# Patient Record
Sex: Male | Born: 1955
Health system: Southern US, Community
[De-identification: ages and names within clinical notes are randomized; demographics above are authoritative.]

## PROBLEM LIST (undated history)

## (undated) DIAGNOSIS — N400 Enlarged prostate without lower urinary tract symptoms: Secondary | ICD-10-CM

## (undated) DIAGNOSIS — K8020 Calculus of gallbladder without cholecystitis without obstruction: Secondary | ICD-10-CM

## (undated) DIAGNOSIS — K635 Polyp of colon: Secondary | ICD-10-CM

## (undated) DIAGNOSIS — R748 Abnormal levels of other serum enzymes: Secondary | ICD-10-CM

## (undated) DIAGNOSIS — Z862 Personal history of diseases of the blood and blood-forming organs and certain disorders involving the immune mechanism: Secondary | ICD-10-CM

## (undated) DIAGNOSIS — K759 Inflammatory liver disease, unspecified: Secondary | ICD-10-CM

## (undated) DIAGNOSIS — I071 Rheumatic tricuspid insufficiency: Secondary | ICD-10-CM

## (undated) DIAGNOSIS — R42 Dizziness and giddiness: Secondary | ICD-10-CM

## (undated) DIAGNOSIS — E7800 Pure hypercholesterolemia, unspecified: Secondary | ICD-10-CM

## (undated) DIAGNOSIS — N529 Male erectile dysfunction, unspecified: Secondary | ICD-10-CM

## (undated) DIAGNOSIS — E079 Disorder of thyroid, unspecified: Secondary | ICD-10-CM

## (undated) DIAGNOSIS — E039 Hypothyroidism, unspecified: Secondary | ICD-10-CM

## (undated) DIAGNOSIS — M1990 Unspecified osteoarthritis, unspecified site: Secondary | ICD-10-CM

## (undated) DIAGNOSIS — I340 Nonrheumatic mitral (valve) insufficiency: Secondary | ICD-10-CM

## (undated) HISTORY — DX: Hypothyroidism, unspecified: E03.9

## (undated) HISTORY — DX: Inflammatory liver disease, unspecified: K75.9

## (undated) HISTORY — PX: FLEXIBLE SIGMOIDOSCOPY: SHX1649

## (undated) HISTORY — DX: Nonrheumatic mitral (valve) insufficiency: I34.0

## (undated) HISTORY — DX: Polyp of colon: K63.5

## (undated) HISTORY — DX: Calculus of gallbladder without cholecystitis without obstruction: K80.20

## (undated) HISTORY — DX: Dizziness and giddiness: R42

## (undated) HISTORY — DX: Benign prostatic hyperplasia without lower urinary tract symptoms: N40.0

## (undated) HISTORY — DX: Abnormal levels of other serum enzymes: R74.8

## (undated) HISTORY — DX: Male erectile dysfunction, unspecified: N52.9

## (undated) HISTORY — DX: Rheumatic tricuspid insufficiency: I07.1

## (undated) HISTORY — DX: Personal history of diseases of the blood and blood-forming organs and certain disorders involving the immune mechanism: Z86.2

## (undated) HISTORY — DX: Pure hypercholesterolemia, unspecified: E78.00

## (undated) HISTORY — DX: Disorder of thyroid, unspecified: E07.9

---

## 2005-07-08 ENCOUNTER — Ambulatory Visit (HOSPITAL_COMMUNITY): Admission: RE | Admit: 2005-07-08 | Discharge: 2005-07-08 | Payer: Self-pay | Admitting: Gastroenterology

## 2005-07-08 ENCOUNTER — Encounter (INDEPENDENT_AMBULATORY_CARE_PROVIDER_SITE_OTHER): Payer: Self-pay | Admitting: *Deleted

## 2007-12-28 ENCOUNTER — Emergency Department (HOSPITAL_COMMUNITY): Admission: EM | Admit: 2007-12-28 | Discharge: 2007-12-28 | Payer: Self-pay | Admitting: Emergency Medicine

## 2011-05-07 NOTE — Consult Note (Signed)
NAME:  Alex West, Alex West NO.:  [id number]   MEDICAL RECORD NO.:  [id number]          PATIENT TYPE:  EMS   LOCATION:  MAJO                         FACILITY:  [facility]   PHYSICIAN:  Jake Bathe, MD      DATE OF BIRTH:  06-Feb-1956   DATE OF CONSULTATION:  12/28/2007  DATE OF DISCHARGE:                                 CONSULTATION   REFERRING PHYSICIAN:  Orlene Och, MD   REASON FOR CONSULTATION:  Mr. Caseres has been seen at the request of Dr.  Patrica Duel for the evaluation of abnormal ECG and chest pain.   HISTORY OF PRESENT ILLNESS:  This is a 55 year old male who was seen  today in urgent care by Dr. Lazarus Salines and complained of mid scapular left-  sided burning pain that was occurring over the past week with waxing and  waning.  Worse with his neck flexed.  He has had similar discomfort in  the past.  No syncope.  No fevers, chills, nausea, vomiting, cough,  bleeding, orthopnea, or PND.   EKG was performed at urgent care and demonstrated ST elevation in I and  aVL, which was significant for J-point elevation with concomitant ST  depression in III and aVF.  He was then given an aspirin and taken over  to [facility] for possible emergent STEMI catheterization.  Once in the  catheterization lab, he was reevaluated by Dr. Vesta Mixer, who  promptly noted that he was in no distress, comfortable.  The patient  refused cardiac catheterization.   He was taken down to the emergency department where I saw him, and he is  currently asymptomatic.  No chest pain and describes the story as above,  mostly describing muscle tension in his mid scapular area.   He had seen Dr. Katrinka Blazing approximately 4-5 years ago where he had a stress  test performed possibly because of his abnormal EKG or old findings on  abnormal EKG and was noted to have a pericardial effusion, which was  monitored.  He does remember him saying something about an abnormal  electrocardiogram.  I do not have the copy  in front of me.   PAST MEDICAL HISTORY:  1. Hyperlipidemia - however, off Lipitor for the past 2 years.  2. Hypothyroidism - on Synthroid.  3. History of pericardial effusion and possible abnormal EKG as      current.  4. Erectile dysfunction.  5. Tobacco abuse.   ALLERGIES:  No known drug allergies.   MEDICATIONS:  1. Synthroid.  2. Cialis p.r.n.   FAMILY HISTORY:  He has a family history of hypertension, but no early  coronary artery disease.   SOCIAL HISTORY:  He is a Community education officer.  He currently smokes.  No drug  use.  Rare alcohol use.   REVIEW OF SYSTEMS:  Unless specified above, all of the 12 review of  systems negative.   PHYSICAL EXAMINATION:  VITAL SIGNS:  Blood pressure 133/88 and pulse 66.  Satting 100% on room air.  Afebrile.  GENERAL:  Alert and oriented x3, in no acute distress,  pleasant, lying  in bed.  EYES:  Well perfused conjunctivae.  EOMI.  No scleral icterus.  ENT:  Moist mucous membranes.  NECK:  Supple.  No carotid bruits.  No JVD.  No thyromegaly.  CARDIOVASCULAR:  Regular rate and rhythm with no murmurs, rubs, or  gallops.  Normal PMI.  LUNGS:  Clear to auscultation bilaterally.  Normal respiratory effort.  ABDOMEN:  Mildly obese.  Positive bowel sounds.  No bruits.  EXTREMITIES:  No clubbing, cyanosis, or edema.  Normal distal pulses.  NEUROLOGIC:  Nonfocal and normal gait.  SKIN:  Warm, dry, and intact.  No rashes.   DATA:  ECG performed here in the emergency department is consistent with  Dr. Raye Sorrow ECG from early with J-point elevation in I and aVL as well as  ST depression and T-wave inversion in III and aVF.  No changes in EKG.  First set of cardiac biomarkers was normal with an MB less than 5.  Awaiting further labs.   ASSESSMENT:  A 55 year old male with tobacco abuse, former  hyperlipidemia with 1-week of waxing and waning left shoulder, left arm,  and left back discomfort and mild radiation to his chest wall.   PLAN:  The pain is  fairly atypical for acute coronary syndrome.  He is  able to reproduce this discomfort when he flexes his neck forward or  touches his chin to his chest.  His EKGs have not evolved.  Unfortunately, I do not have a former ECG to compare this to.  First set  of cardiac biomarkers is unremarkable and normal.  He is currently  asymptomatic and doing well.  After reviewing the situation, I feel  comfortable sending him home with close followup where I will see him in  the office, hopefully, in 1 week.  At that time, I may repeat stress  testing as well as echocardiogram given his prior history of pericardial  effusion.  Certainly, his ECG can be consistent with pericarditis or  just may be his normal variant.  Prior EKG will be helpful.  The patient  is satisfied with this plan and knows to return to the emergency  department immediately if any further symptoms develop or anything  worrisome occurs.      Jake Bathe, MD  Electronically Signed     MCS/MEDQ  D:  12/28/2007  T:  12/29/2007  Job:  [id number]   cc:   Orlene Och, MD

## 2011-05-10 NOTE — Op Note (Signed)
NAMEMarland West  MALACAI, GRANTZ                ACCOUNT NO.:  [id number]   MEDICAL RECORD NO.:  [id number]          PATIENT TYPE:  AMB   LOCATION:  ENDO                         FACILITY:  [facility]   PHYSICIAN:  Bernette Redbird, M.D.   DATE OF BIRTH:  12-20-1956   DATE OF PROCEDURE:  07/08/2005  DATE OF DISCHARGE:                                 OPERATIVE REPORT   PROCEDURE:  Colonoscopy with polypectomy.   INDICATIONS:  A 55 year old African-American male for initial colon cancer  screening exam with a history of recurrent small-volume hematochezia which  has improved somewhat on cortisone cream treatments, associated with mild  anemia (hemoglobin 12.6) but with normal iron studies.   FINDINGS:  Excoriated internal hemorrhoids. Small colon polyp.   PROCEDURE:  The nature, purpose, risks of the procedure had been discussed  with the patient who provided written consent. Sedation prior to and during  the course the exam totaled fentanyl 75 mcg and Versed 7 milligrams IV  without arrhythmias or desaturation. Digital exam showed really a rather  flat prostate bed. No prostatic enlargement or nodules were appreciated.   The Olympus adult adjustable tension video colonoscope was advanced to the  area just above the cecum, whereupon some external abdominal compression  facilitated entry into the cecum as identified by clear visualization of the  appendiceal orifice. The terminal ileum was entered for short distance also  and appeared normal. Pullback was then performed. The quality of prep was  very good and it is felt that all areas were well seen. A moderate amount  irrigation was used to get rid of a little bit of film and stool film  especially in the proximal colon.   Just above the cecum, almost on the roof of the ileocecal valve, was a 4-5  mm semipedunculated polyp removed by cold snare technique and retrieved by  suctioning through the scope.   No other polyps were seen and there was no  evidence of cancer, colitis,  vascular malformations or diverticulosis. Retroflexion of the rectum and  reinspection of the rectum was unremarkable and in particular there was no  evidence of proctitis.   Pullout through the anal canal, however, demonstrated moderate internal  hemorrhoids with fairly significant excoriation.   No anal fissure was seen.   The patient tolerated the procedure well and there no apparent  complications.   IMPRESSION:  1.  Solitary small polyp removed as described above.  2.  Rectal bleeding presumably of hemorrhoidal origin based on current      appearance.   PLAN:  1.  Await pathology on the polyp.  2.  Could consider injection therapy for hemorrhoidal treatment if bleeding      persists.       RB/MEDQ  D:  07/08/2005  T:  07/08/2005  Job:  [id number]   cc:   C. Duane Lope, M.D.  [address]  [place]  [place] [address]  Fax: [phone]

## 2011-09-12 LAB — APTT: aPTT: 28

## 2011-09-12 LAB — POCT CARDIAC MARKERS
CKMB, poc: 1.6
Myoglobin, poc: 77.3

## 2011-09-12 LAB — PROTIME-INR
INR: 1.1
Prothrombin Time: 14.1

## 2012-11-04 ENCOUNTER — Other Ambulatory Visit: Payer: Self-pay | Admitting: Gastroenterology

## 2012-11-04 DIAGNOSIS — R7989 Other specified abnormal findings of blood chemistry: Secondary | ICD-10-CM

## 2012-11-10 ENCOUNTER — Ambulatory Visit
Admission: RE | Admit: 2012-11-10 | Discharge: 2012-11-10 | Disposition: A | Discharge status: Home / Self Care | Payer: BC Managed Care – PPO | Source: Ambulatory Visit | Attending: Gastroenterology | Admitting: Gastroenterology

## 2012-11-10 DIAGNOSIS — R7989 Other specified abnormal findings of blood chemistry: Secondary | ICD-10-CM

## 2013-01-25 ENCOUNTER — Ambulatory Visit (INDEPENDENT_AMBULATORY_CARE_PROVIDER_SITE_OTHER): Payer: BC Managed Care – PPO | Admitting: Internal Medicine

## 2013-01-25 VITALS — BP 120/90 | HR 79 | Temp 98.4°F | Resp 16 | Ht 74.0 in | Wt 265.0 lb

## 2013-01-25 DIAGNOSIS — H6090 Unspecified otitis externa, unspecified ear: Secondary | ICD-10-CM

## 2013-01-25 DIAGNOSIS — H60399 Other infective otitis externa, unspecified ear: Secondary | ICD-10-CM

## 2013-01-25 DIAGNOSIS — H6690 Otitis media, unspecified, unspecified ear: Secondary | ICD-10-CM

## 2013-01-25 MED ORDER — NEOMYCIN-POLYMYXIN-HC 3.5-10000-1 OT SOLN: 3.0000 [drp] | Freq: Four times a day (QID) | OTIC | Status: DC

## 2013-01-25 MED ORDER — AMOXICILLIN-POT CLAVULANATE 875-125 MG PO TABS: 1.0000 | ORAL_TABLET | Freq: Two times a day (BID) | ORAL | Status: DC

## 2013-01-25 NOTE — Patient Instructions (Signed)
Take meds as directed

## 2013-01-25 NOTE — Progress Notes (Signed)
  Subjective:    Patient ID: Alex West, male    DOB: 1956/05/19, 57 y.o.   MRN: [id number]  HPI Right ear pain Stuffiness in ear Decrease hearing Feels like water in his ear  Onset 1 week ago Used debrox at home with no relief. Had a previous episode that was similar  Review of Systems  Constitutional: Negative.   HENT: Positive for hearing loss, ear pain and congestion.   Eyes: Negative.   Respiratory: Negative.   Cardiovascular: Negative.   Gastrointestinal: Negative.   Genitourinary: Negative.   Musculoskeletal: Negative.   Skin: Negative.   Neurological: Negative.   Hematological: Negative.   Psychiatric/Behavioral: Negative.   All other systems reviewed and are negative.       Objective:   Physical Exam  Nursing note and vitals reviewed. Constitutional: He is oriented to person, place, and time. He appears well-developed and well-nourished.  HENT:  Head: Normocephalic and atraumatic.  Left Ear: External ear normal.       Right ear with otitis media and otitis externa  Eyes: Conjunctivae normal and EOM are normal. Pupils are equal, round, and reactive to light.  Neck: Normal range of motion. Neck supple.  Cardiovascular: Normal rate, regular rhythm and normal heart sounds.   Pulmonary/Chest: Effort normal.  Abdominal: Soft. Bowel sounds are normal.  Musculoskeletal: Normal range of motion.  Neurological: He is alert and oriented to person, place, and time.  Skin: Skin is warm and dry.  Psychiatric: He has a normal mood and affect. His behavior is normal. Judgment and thought content normal.          Assessment & Plan:  Otitis media and otitis externa of the right ear

## 2013-03-04 ENCOUNTER — Other Ambulatory Visit (HOSPITAL_COMMUNITY): Payer: Self-pay | Admitting: Internal Medicine

## 2013-03-04 DIAGNOSIS — K759 Inflammatory liver disease, unspecified: Secondary | ICD-10-CM

## 2013-03-05 ENCOUNTER — Other Ambulatory Visit: Payer: Self-pay | Admitting: Radiology

## 2013-03-08 ENCOUNTER — Encounter (HOSPITAL_COMMUNITY): Payer: Self-pay

## 2013-03-08 ENCOUNTER — Ambulatory Visit (HOSPITAL_COMMUNITY)
Admission: RE | Admit: 2013-03-08 | Discharge: 2013-03-08 | Disposition: A | Discharge status: Home / Self Care | Payer: BC Managed Care – PPO | Source: Ambulatory Visit | Attending: Internal Medicine | Admitting: Internal Medicine

## 2013-03-08 DIAGNOSIS — K759 Inflammatory liver disease, unspecified: Secondary | ICD-10-CM

## 2013-03-08 DIAGNOSIS — E079 Disorder of thyroid, unspecified: Secondary | ICD-10-CM | POA: Insufficient documentation

## 2013-03-08 DIAGNOSIS — K8020 Calculus of gallbladder without cholecystitis without obstruction: Secondary | ICD-10-CM | POA: Insufficient documentation

## 2013-03-08 DIAGNOSIS — K739 Chronic hepatitis, unspecified: Secondary | ICD-10-CM | POA: Insufficient documentation

## 2013-03-08 LAB — CBC
MCH: 32.1 pg (ref 26.0–34.0)
MCV: 88.9 fL (ref 78.0–100.0)
Platelets: 176 10*3/uL (ref 150–400)
RBC: 4.61 MIL/uL (ref 4.22–5.81)
RDW: 12.3 % (ref 11.5–15.5)

## 2013-03-08 MED ORDER — MIDAZOLAM HCL 2 MG/2ML IJ SOLN
INTRAMUSCULAR | Status: AC | PRN
  Administered 2013-03-08: 1 mg via INTRAVENOUS
  Administered 2013-03-08 (×2): 0.5 mg via INTRAVENOUS

## 2013-03-08 MED ORDER — SODIUM CHLORIDE 0.9 % IV SOLN
Freq: Once | INTRAVENOUS | Status: AC
  Administered 2013-03-08: 09:00:00 via INTRAVENOUS

## 2013-03-08 MED ORDER — MIDAZOLAM HCL 2 MG/2ML IJ SOLN
INTRAMUSCULAR | Status: AC
  Filled 2013-03-08: qty 4

## 2013-03-08 MED ORDER — OXYCODONE HCL 5 MG PO TABS: 5.0000 mg | ORAL_TABLET | ORAL | Status: DC | PRN

## 2013-03-08 MED ORDER — FENTANYL CITRATE 0.05 MG/ML IJ SOLN
INTRAMUSCULAR | Status: AC | PRN
  Administered 2013-03-08: 25 ug via INTRAVENOUS
  Administered 2013-03-08: 50 ug via INTRAVENOUS
  Administered 2013-03-08: 25 ug via INTRAVENOUS

## 2013-03-08 MED ORDER — FENTANYL CITRATE 0.05 MG/ML IJ SOLN
INTRAMUSCULAR | Status: AC
  Filled 2013-03-08: qty 4

## 2013-03-08 NOTE — H&P (Signed)
Alex West is an 57 y.o. male.   Chief Complaint: elevated liver functions x 2 yrs Worsened recently Sent for [place] and discovered gallstones Referred to Dr Timothy Lasso; request made for liver core biopsy HPI: Thyroid dz  Past Medical History  Diagnosis Date  . Thyroid disease     History reviewed. No pertinent past surgical history.  Family History  Problem Relation Age of Onset  . Kidney failure Brother   . Lupus Daughter    Social History:  reports that he has never smoked. He does not have any smokeless tobacco history on file. He reports that he does not drink alcohol or use illicit drugs.  Allergies: No Known Allergies   (Not in a hospital admission)  Results for orders placed during the hospital encounter of 03/08/13 (from the past 48 hour(s))  APTT     Status: None   Collection Time    03/08/13  9:01 AM      Result Value Range   aPTT 30  24 - 37 seconds  CBC     Status: Abnormal   Collection Time    03/08/13  9:01 AM      Result Value Range   WBC 5.3  4.0 - 10.5 K/uL   RBC 4.61  4.22 - 5.81 MIL/uL   Hemoglobin 14.8  13.0 - 17.0 g/dL   HCT 16.1  09.6 - 04.5 %   MCV 88.9  78.0 - 100.0 fL   MCH 32.1  26.0 - 34.0 pg   MCHC 36.1 (*) 30.0 - 36.0 g/dL   RDW 40.9  81.1 - 91.4 %   Platelets 176  150 - 400 K/uL  PROTIME-INR     Status: None   Collection Time    03/08/13  9:01 AM      Result Value Range   Prothrombin Time 14.5  11.6 - 15.2 seconds   INR 1.15  0.00 - 1.49   No results found.  Review of Systems  Constitutional: Negative for fever and weight loss.  Respiratory: Negative for cough.   Cardiovascular: Negative for chest pain.  Gastrointestinal: Negative for nausea, vomiting and abdominal pain.  Neurological: Negative for weakness and headaches.  Psychiatric/Behavioral: Negative for substance abuse.    Blood pressure 123/85, pulse 6, temperature 97 F (36.1 C), temperature source Oral, resp. rate 18, height 6\' 3"  (1.905 m), weight 260 lb (117.935 kg),  SpO2 98.00%. Physical Exam  Constitutional: He is oriented to person, place, and time. He appears well-developed and well-nourished.  Cardiovascular: Normal rate, regular rhythm and normal heart sounds.   No murmur heard. Respiratory: Effort normal and breath sounds normal.  GI: Soft. Bowel sounds are normal. There is no tenderness.  Musculoskeletal: Normal range of motion.  Neurological: He is alert and oriented to person, place, and time.  Psychiatric: He has a normal mood and affect. His behavior is normal. Judgment and thought content normal.     Assessment/Plan Elevated liver fxs Scheduled for liver core biopsy Pt aware of procedure benefits and risks and agreeable to proceed Consent signed and in chart  Alex West A 03/08/2013, 9:36 AM

## 2013-03-08 NOTE — ED Notes (Signed)
Short stay notified of need for bed 

## 2013-03-08 NOTE — Procedures (Signed)
Successful ultrasound guided liver biopsy.  3 cores obtained and no immediate complication.

## 2014-07-19 ENCOUNTER — Other Ambulatory Visit: Payer: Self-pay | Admitting: Nurse Practitioner

## 2014-07-19 DIAGNOSIS — C220 Liver cell carcinoma: Secondary | ICD-10-CM

## 2014-07-29 ENCOUNTER — Ambulatory Visit
Admission: RE | Admit: 2014-07-29 | Discharge: 2014-07-29 | Disposition: A | Discharge status: Home / Self Care | Payer: BC Managed Care – PPO | Source: Ambulatory Visit | Attending: Nurse Practitioner | Admitting: Nurse Practitioner

## 2014-07-29 DIAGNOSIS — C220 Liver cell carcinoma: Secondary | ICD-10-CM

## 2014-08-26 ENCOUNTER — Encounter (HOSPITAL_COMMUNITY): Payer: Self-pay

## 2014-09-05 ENCOUNTER — Ambulatory Visit (INDEPENDENT_AMBULATORY_CARE_PROVIDER_SITE_OTHER): Payer: BC Managed Care – PPO | Admitting: Internal Medicine

## 2014-09-05 VITALS — BP 124/90 | HR 105 | Temp 102.5°F | Resp 16 | Ht 73.75 in | Wt 265.4 lb

## 2014-09-05 DIAGNOSIS — R5381 Other malaise: Secondary | ICD-10-CM

## 2014-09-05 DIAGNOSIS — R5383 Other fatigue: Secondary | ICD-10-CM

## 2014-09-05 DIAGNOSIS — D899 Disorder involving the immune mechanism, unspecified: Secondary | ICD-10-CM

## 2014-09-05 DIAGNOSIS — R509 Fever, unspecified: Secondary | ICD-10-CM

## 2014-09-05 DIAGNOSIS — K759 Inflammatory liver disease, unspecified: Secondary | ICD-10-CM

## 2014-09-05 DIAGNOSIS — D849 Immunodeficiency, unspecified: Secondary | ICD-10-CM | POA: Insufficient documentation

## 2014-09-05 DIAGNOSIS — K754 Autoimmune hepatitis: Secondary | ICD-10-CM | POA: Insufficient documentation

## 2014-09-05 LAB — POCT UA - MICROSCOPIC ONLY
Bacteria, U Microscopic: NEGATIVE
Casts, Ur, LPF, POC: NEGATIVE
Crystals, Ur, HPF, POC: NEGATIVE
EPITHELIAL CELLS, URINE PER MICROSCOPY: NEGATIVE
MUCUS UA: NEGATIVE
RBC, URINE, MICROSCOPIC: NEGATIVE
Yeast, UA: NEGATIVE

## 2014-09-05 LAB — POCT CBC
GRANULOCYTE PERCENT: 84.3 % — AB (ref 37–80)
HEMATOCRIT: 46.6 % (ref 43.5–53.7)
Hemoglobin: 15.3 g/dL (ref 14.1–18.1)
Lymph, poc: 1.3 (ref 0.6–3.4)
MCH: 30.9 pg (ref 27–31.2)
MCHC: 32.9 g/dL (ref 31.8–35.4)
MCV: 94 fL (ref 80–97)
MID (cbc): 0.7 (ref 0–0.9)
MPV: 8.3 fL (ref 0–99.8)
POC Granulocyte: 11.2 — AB (ref 2–6.9)
POC LYMPH %: 10.1 % (ref 10–50)
POC MID %: 5.6 %M (ref 0–12)
Platelet Count, POC: 167 10*3/uL (ref 142–424)
RBC: 4.96 M/uL (ref 4.69–6.13)
RDW, POC: 13.4 %
WBC: 13.3 10*3/uL — AB (ref 4.6–10.2)

## 2014-09-05 LAB — POCT URINALYSIS DIPSTICK
Bilirubin, UA: NEGATIVE
GLUCOSE UA: NEGATIVE
KETONES UA: NEGATIVE
Nitrite, UA: NEGATIVE
Protein, UA: NEGATIVE
RBC UA: NEGATIVE
SPEC GRAV UA: 1.01
Urobilinogen, UA: 0.2
pH, UA: 6

## 2014-09-05 MED ORDER — DOXYCYCLINE HYCLATE 100 MG PO TABS: 100.0000 mg | ORAL_TABLET | Freq: Two times a day (BID) | ORAL | Status: DC

## 2014-09-05 MED ORDER — CEFTRIAXONE SODIUM 1 G IJ SOLR
1.0000 g | Freq: Once | INTRAMUSCULAR | Status: AC
  Administered 2014-09-05: 1 g via INTRAMUSCULAR

## 2014-09-05 MED ORDER — ACETAMINOPHEN 325 MG PO TABS: 1000.0000 mg | ORAL_TABLET | ORAL | Status: DC | PRN

## 2014-09-05 MED ORDER — ACETAMINOPHEN 325 MG PO TABS: 500.0000 mg | ORAL_TABLET | ORAL | Status: DC | PRN

## 2014-09-05 NOTE — Patient Instructions (Addendum)
Immunosuppression Immunosuppression is the use of medicine to lower your body's immune response. Your immune response is your body's natural reaction to defend against something new or unknown that enters your body, such as viruses and bacteria.  WHY AM I RECEIVING IMMUNOSUPPRESSION? You may be receiving immunosuppression to hold back your body's immune response for one of the following reasons:  As treatment for an autoimmune disorder. These disorders cause your body to use the immune response to attack itself.  To prevent your body from rejecting a transplant of cells, tissues, or organs that you have received from a donor. When you receive a transplant from a donor, your immune system knows that something is new and unknown to your body. Sometimes this triggers an immune response to attack the transplant.  To prevent inflammation caused by certain long-term (chronic) conditions such as asthma or chronic obstructive pulmonary disease (COPD). Often these types of chronic conditions are associated with severe immune responses. These responses can cause inflammation that can lead to life-threatening situations. Suppression of your body's immune system also affects your body's ability to defend itself and can lead to certain side effects. WHAT ARE THE SIDE EFFECTS OF IMMUNOSUPPRESSION? The major side effect of immunosuppression is an increased risk of infection. You should call your health care provider if you have the following signs or symptoms of an infection:  A fever.  Drainage or bad odor of drainage from your surgical scar if you had an organ transplant.  Burning when you pass your urine.  A cold or cough that will not go away.  Body aches. Other side effects typically go away as your body adjusts to the medicine. These side effects can include:  Nausea and vomiting.  Loss of appetite.  Increased hair growth.  Shaky hands (hand tremor). Adjusting medicine dosages or treating the  side effects will often decrease problems.  WHY IS IT IMPORTANT FOR ME TO TAKE MY MEDICINE EXACTLY AS INSTRUCTED? In order for immunosuppression to work, your body needs to have just the right level of medicine all of the time. For this to happen, your health care provider will tell you exactly how much and exactly when you need to take your medicine. It is very important to follow your health care provider's instructions. Even missing a single dose can cause your condition to worsen. If you forget a dose of medicine, take it as soon as you remember and call your health care provider right away. However, if you miss a dose and it is time to take your next dose, do not take a double dose.  WHAT CAN HELP ME DEVELOP A ROUTINE TO TAKE MY MEDICINE EXACTLY AS INSTRUCTED?  Use a tool such as a pill Environmental education officer, a written chart from your health care provider, a notebook, a binder, or your own calendar to organize your daily medicine(s). Your tool should help you keep track of the:  Name of the medicine and the dose.  Days to take the medicine(s).  Time of day to take the medicine(s).  Set cues or reminders for taking your medicine(s), such as setting an alarm on a clock or cell phone.  Review your medicine schedule with family members or friends so they can help you remember when to take your medicine(s) and how much to take. Document Released: 12/14/2013 Document Reviewed: 12/14/2013 [facility] Patient Information 2015 [place], [place]. This information is not intended to replace advice given to you by your health care provider. Make sure you discuss any questions  you have with your health care provider. [place] Spotted Fever [place] Spotted Fever (RMSF) is the oldest known tick-borne disease of people in the [place]. This disease was named because it was first described among people in the [facility] area who had an illness characterized by a rash with red-purple-black spots. This  disease is caused by a rickettsia (Rickettsia rickettsii), a bacteria carried by the tick. The [facility] wood tick and the American dog tick acquire and transmit the RMSF bacteria (pictures NOT actual size). When a larval, nymphal, or adult tick feeds on an infected rodent or larger animal, the tick can become infected. Infected adult ticks then feed on people who may then get RMSF. The tick transmits the disease to humans during a prolonged period of feeding that lasts many hours, days, or even a couple weeks. The bite is painless and frequently goes unnoticed. An infected male tick may also pass the rickettsial bacteria to her eggs that then may mature to be infected adult ticks. The rickettsia that causes RMSF can also get into a person's body through damaged skin. A tick bite is not necessary. People can get RMSF if they crush a tick and get its blood or body fluids on their skin through a small cut or sore.  DIAGNOSIS Diagnosis is made by laboratory tests.  TREATMENT Treatment is with antibiotics (medications that kill rickettsia and other bacteria). Immediate treatment usually prevents death. GEOGRAPHIC RANGE This disease was reported only in the [facility] until 1931. RMSF has more recently been described among individuals in all states except [place], [place], and [place]. The highest reported incidences of RMSF now occur among residents of [place], [place], [place], and the [place]. TIME OF YEAR  Most cases are diagnosed during late spring and summer when ticks are most active. However, especially in the warmer [place] states, a few cases occur during the winter. SYMPTOMS   Symptoms of RMSF begin from 2 to 14 days after a tick bite. The most common early symptoms are fever, muscle aches, and headache followed by nausea (feeling sick to your stomach) or vomiting.  The RMSF rash is typically delayed until 3 or more days after symptom onset, and eventually develops in 9 of 10  infected patients by the fifth day of illness. If the disease is not treated it can cause death. If you get a fever, headache, muscle aches, rash, nausea, or vomiting within 2 weeks of a possible tick bite or exposure, you should see your caregiver immediately. PREVENTION Ticks prefer to hide in shady, moist ground litter. They can often be found above the ground clinging to tall grass, brush, shrubs and low tree branches. They also inhabit lawns and gardens, especially at the edges of woodlands and around old stone walls. Within the areas where ticks generally live, no naturally vegetated area can be considered completely free of infected ticks. The best precaution against RMSF is to avoid contact with soil, leaf litter, and vegetation as much as possible in tick-infested areas. For those who enjoy gardening or walking in their yards, clear brush and mow tall grass around houses and at the edges of gardens. This may help reduce the tick population in the immediate area. Applications of chemical insecticides by a licensed professional in the spring (late May) and fall (September) will also control ticks, especially in heavily infested areas. Treatment will never get rid of all the ticks. Getting rid of small animal populations that host ticks will also decrease the  tick population. When working in the garden, [employer], or handling soil and vegetation, wear light-colored protective clothing and gloves. Spot-check often to prevent ticks from reaching the skin. Ticks cannot jump or fly. They will not drop from an above-ground perch onto a passing animal. Once a tick gains access to human skin it climbs upward until it reaches a more protected area. For example, the back of the knee, groin, navel, armpit, ears, or nape of the neck. It then begins the slow process of embedding itself in the skin. Campers, hikers, field workers, and others who spend time in wooded, brushy, or tall grassy areas can avoid exposure  to ticks by using the following precautions:  Wear light-colored clothing with a tight weave to spot ticks more easily and prevent contact with the skin.  Wear long pants tucked into socks, long-sleeved shirts tucked into pants and enclosed shoes or boots along with insect repellent.  Spray clothes with insect repellent containing either DEET or Permethrin. Only DEET can be used on exposed skin. Follow the manufacturer's directions carefully.  Wear a hat and keep long hair pulled back.  Stay on cleared, well-worn trails whenever possible.  Spot-check yourself and others often for the presence of ticks on clothes. If you find one, there are likely to be others. Check thoroughly.  Remove clothes after leaving tick-infested areas. If possible, wash them to eliminate any unseen ticks. Check yourself, your children and any pets from head to toe for the presence of ticks.  Shower and shampoo. You can greatly reduce your chances of contracting RMSF if you remove attached ticks as soon as possible. Regular checks of the body, including all body sites covered by hair (head, armpits, genitals), allow removal of the tick before rickettsial transmission. To remove an attached tick, use a forceps or tweezers to detach the intact tick without leaving mouth parts in the skin. The tick bite wound should be cleansed after tick removal. Remember the most common symptoms of RMSF are fever, muscle aches, headache, and nausea or vomiting with a later onset of rash. If you get these symptoms after a tick bite and while living in an area where RMSF is found, RMSF should be suspected. If the disease is not treated, it can cause death. See your caregiver immediately if you get these symptoms. Do this even if not aware of a tick bite. Document Released: 03/23/2001 Document Revised: 04/25/2014 Document Reviewed: 11/13/2009 [facility] Patient Information 2015 [place], [place]. This information is not intended to replace advice  given to you by your health care provider. Make sure you discuss any questions you have with your health care provider. Tick Bite Information Ticks are insects that attach themselves to the skin and draw blood for food. There are various types of ticks. Common types include wood ticks and deer ticks. Most ticks live in shrubs and grassy areas. Ticks can climb onto your body when you make contact with leaves or grass where the tick is waiting. The most common places on the body for ticks to attach themselves are the scalp, neck, armpits, waist, and groin. Most tick bites are harmless, but sometimes ticks carry germs that cause diseases. These germs can be spread to a person during the tick's feeding process. The chance of a disease spreading through a tick bite depends on:   The type of tick.  Time of year.   How long the tick is attached.   Geographic location.  HOW CAN YOU PREVENT TICK BITES? Take  these steps to help prevent tick bites when you are outdoors:  Wear protective clothing. Long sleeves and long pants are best.   Wear white clothes so you can see ticks more easily.  Tuck your pant legs into your socks.   If walking on a trail, stay in the middle of the trail to avoid brushing against bushes.  Avoid walking through areas with long grass.  Put insect repellent on all exposed skin and along boot tops, pant legs, and sleeve cuffs.   Check clothing, hair, and skin repeatedly and before going inside.   Brush off any ticks that are not attached.  Take a shower or bath as soon as possible after being outdoors.  WHAT IS THE PROPER WAY TO REMOVE A TICK? Ticks should be removed as soon as possible to help prevent diseases caused by tick bites. 1. If latex gloves are available, put them on before trying to remove a tick.  2. Using fine-point tweezers, grasp the tick as close to the skin as possible. You may also use curved forceps or a tick removal tool. Grasp the tick  as close to its head as possible. Avoid grasping the tick on its body. 3. Pull gently with steady upward pressure until the tick lets go. Do not twist the tick or jerk it suddenly. This may break off the tick's head or mouth parts. 4. Do not squeeze or crush the tick's body. This could force disease-carrying fluids from the tick into your body.  5. After the tick is removed, wash the bite area and your hands with soap and water or other disinfectant such as alcohol. 6. Apply a small amount of antiseptic cream or ointment to the bite site.  7. Wash and disinfect any instruments that were used.  Do not try to remove a tick by applying a hot match, petroleum jelly, or fingernail polish to the tick. These methods do not work and may increase the chances of disease being spread from the tick bite.  WHEN SHOULD YOU SEEK MEDICAL CARE? Contact your health care provider if you are unable to remove a tick from your skin or if a part of the tick breaks off and is stuck in the skin.  After a tick bite, you need to be aware of signs and symptoms that could be related to diseases spread by ticks. Contact your health care provider if you develop any of the following in the days or weeks after the tick bite:  Unexplained fever.  Rash. A circular rash that appears days or weeks after the tick bite may indicate the possibility of Lyme disease. The rash may resemble a target with a bull's-eye and may occur at a different part of your body than the tick bite.  Redness and swelling in the area of the tick bite.   Tender, swollen lymph glands.   Diarrhea.   Weight loss.   Cough.   Fatigue.   Muscle, joint, or bone pain.   Abdominal pain.   Headache.   Lethargy or a change in your level of consciousness.  Difficulty walking or moving your legs.   Numbness in the legs.   Paralysis.  Shortness of breath.   Confusion.   Repeated vomiting.  Document Released: 12/06/2000 Document  Revised: 09/29/2013 Document Reviewed: 05/19/2013 [facility] Patient Information 2015 [place], [place]. This information is not intended to replace advice given to you by your health care provider. Make sure you discuss any questions you have with your health care  provider. Fever, Adult A fever is a higher than normal body temperature. In an adult, an oral temperature around 98.6 F (37 C) is considered normal. A temperature of 100.4 F (38 C) or higher is generally considered a fever. Mild or moderate fevers generally have no long-term effects and often do not require treatment. Extreme fever (greater than or equal to 106 F or 41.1 C) can cause seizures. The sweating that may occur with repeated or prolonged fever may cause dehydration. Elderly people can develop confusion during a fever. A measured temperature can vary with:  Age.  Time of day.  Method of measurement (mouth, underarm, rectal, or ear). The fever is confirmed by taking a temperature with a thermometer. Temperatures can be taken different ways. Some methods are accurate and some are not.  An oral temperature is used most commonly. Electronic thermometers are fast and accurate.  An ear temperature will only be accurate if the thermometer is positioned as recommended by the manufacturer.  A rectal temperature is accurate and done for those adults who have a condition where an oral temperature cannot be taken.  An underarm (axillary) temperature is not accurate and not recommended. Fever is a symptom, not a disease.  CAUSES   Infections commonly cause fever.  Some noninfectious causes for fever include:  Some arthritis conditions.  Some thyroid or adrenal gland conditions.  Some immune system conditions.  Some types of cancer.  A medicine reaction.  High doses of certain street drugs such as methamphetamine.  Dehydration.  Exposure to high outside or room temperatures.  Occasionally, the source of a fever  cannot be determined. This is sometimes called a "fever of unknown origin" (FUO).  Some situations may lead to a temporary rise in body temperature that may go away on its own. Examples are:  Childbirth.  Surgery.  Intense exercise. HOME CARE INSTRUCTIONS   Take appropriate medicines for fever. Follow dosing instructions carefully. If you use acetaminophen to reduce the fever, be careful to avoid taking other medicines that also contain acetaminophen. Do not take aspirin for a fever if you are younger than age 15. There is an association with Reye's syndrome. Reye's syndrome is a rare but potentially deadly disease.  If an infection is present and antibiotics have been prescribed, take them as directed. Finish them even if you start to feel better.  Rest as needed.  Maintain an adequate fluid intake. To prevent dehydration during an illness with prolonged or recurrent fever, you may need to drink extra fluid.Drink enough fluids to keep your urine clear or pale yellow.  Sponging or bathing with room temperature water may help reduce body temperature. Do not use ice water or alcohol sponge baths.  Dress comfortably, but do not over-bundle. SEEK MEDICAL CARE IF:   You are unable to keep fluids down.  You develop vomiting or diarrhea.  You are not feeling at least partly better after 3 days.  You develop new symptoms or problems. SEEK IMMEDIATE MEDICAL CARE IF:   You have shortness of breath or trouble breathing.  You develop excessive weakness.  You are dizzy or you faint.  You are extremely thirsty or you are making little or no urine.  You develop new pain that was not there before (such as in the head, neck, chest, back, or abdomen).  You have persistent vomiting and diarrhea for more than 1 to 2 days.  You develop a stiff neck or your eyes become sensitive to light.  You develop  a skin rash.  You have a fever or persistent symptoms for more than 2 to 3 days.  You  have a fever and your symptoms suddenly get worse. MAKE SURE YOU:   Understand these instructions.  Will watch your condition.  Will get help right away if you are not doing well or get worse. Document Released: 06/04/2001 Document Revised: 04/25/2014 Document Reviewed: 10/10/2011 [facility] Patient Information 2015 [place], [place]. This information is not intended to replace advice given to you by your health care provider. Make sure you discuss any questions you have with your health care provider.

## 2014-09-05 NOTE — Progress Notes (Signed)
   Subjective:    Patient ID: Alex West, male    DOB: 03-Aug-1956, 58 y.o.   MRN: [id number]  HPI    Review of Systems     Objective:   Physical Exam        Assessment & Plan:

## 2014-09-05 NOTE — Progress Notes (Signed)
Subjective:    Patient ID: Alex West, male    DOB: 11-30-1956, 58 y.o.   MRN: [id number]  HPI 58 y/o male Flu like symptoms for three days. Fever/Chills Body Ache Headache Sinus congestion nothing coming out  No Shortness of Breathing No wheezing No  Ear pain No tick bite No urinary issue No chest pain  Will get flu shot at his physical [facility] Dr. Harrington Challenger) Has autoimmune hepatitis on immuran 1 month previously on a steroid for 1 year with remission   Review of Systems     Objective:   Physical Exam  Vitals reviewed. Constitutional: He is oriented to person, place, and time. He appears well-developed and well-nourished. No distress.  HENT:  Head: Normocephalic.  Right Ear: External ear normal.  Left Ear: External ear normal.  Nose: Nose normal.  Mouth/Throat: Oropharynx is clear and moist.  Eyes: Conjunctivae and EOM are normal. Pupils are equal, round, and reactive to light. Right eye exhibits no discharge. Left eye exhibits no discharge. No scleral icterus.  Neck: Normal range of motion. Neck supple.  Cardiovascular: Regular rhythm, S1 normal, S2 normal and normal heart sounds.  Tachycardia present.  Exam reveals no gallop.   No murmur heard. Pulmonary/Chest: Effort normal and breath sounds normal. No respiratory distress. He has no wheezes. He has no rales. He exhibits no tenderness.  Abdominal: Soft. Bowel sounds are normal. He exhibits no mass. There is no tenderness. There is no rebound and no guarding.  Musculoskeletal: Normal range of motion. He exhibits tenderness. He exhibits no edema.  Lymphadenopathy:    He has no cervical adenopathy.  Neurological: He is alert and oriented to person, place, and time. He has normal strength. No cranial nerve deficit or sensory deficit. He exhibits normal muscle tone. He displays a negative Romberg sign. Coordination normal.  Skin: No rash noted. He is diaphoretic.  Psychiatric: He has a normal mood and affect. His  behavior is normal. Judgment and thought content normal.     Results for orders placed in visit on 09/05/14  POCT CBC      Result Value Ref Range   WBC 13.3 (*) 4.6 - 10.2 K/uL   Lymph, poc 1.3  0.6 - 3.4   POC LYMPH PERCENT 10.1  10 - 50 %L   MID (cbc) 0.7  0 - 0.9   POC MID % 5.6  0 - 12 %M   POC Granulocyte 11.2 (*) 2 - 6.9   Granulocyte percent 84.3 (*) 37 - 80 %G   RBC 4.96  4.69 - 6.13 M/uL   Hemoglobin 15.3  14.1 - 18.1 g/dL   HCT, POC 46.6  43.5 - 53.7 %   MCV 94.0  80 - 97 fL   MCH, POC 30.9  27 - 31.2 pg   MCHC 32.9  31.8 - 35.4 g/dL   RDW, POC 13.4     Platelet Count, POC 167  142 - 424 K/uL   MPV 8.3  0 - 99.8 fL  POCT URINALYSIS DIPSTICK      Result Value Ref Range   Color, UA yellow     Clarity, UA clear     Glucose, UA neg     Bilirubin, UA neg     Ketones, UA neg     Spec Grav, UA 1.010     Blood, UA neg     pH, UA 6.0     Protein, UA neg     Urobilinogen, UA 0.2  Nitrite, UA neg     Leukocytes, UA small (1+)    POCT UA - MICROSCOPIC ONLY      Result Value Ref Range   WBC, Ur, HPF, POC 0-1     RBC, urine, microscopic neg     Bacteria, U Microscopic neg     Mucus, UA neg     Epithelial cells, urine per micros neg     Crystals, Ur, HPF, POC neg     Casts, Ur, LPF, POC neg     Yeast, UA neg          Assessment & Plan:  Immunosuppresed/autoimmune hepatitis Fever/Body aches/Fatigue/HA 2days/ro tick fever Blood cultures x2/Urine culture Rocephin 1 g Doxycycline 100mg  bid See your doctor 1-2d/sooner to er

## 2014-09-06 LAB — URINE CULTURE

## 2014-09-11 LAB — CULTURE, BLOOD (SINGLE)
ORGANISM ID, BACTERIA: NO GROWTH
Organism ID, Bacteria: NO GROWTH

## 2017-01-02 DIAGNOSIS — K754 Autoimmune hepatitis: Secondary | ICD-10-CM | POA: Diagnosis not present

## 2017-01-06 DIAGNOSIS — K754 Autoimmune hepatitis: Secondary | ICD-10-CM | POA: Diagnosis not present

## 2018-01-28 ENCOUNTER — Other Ambulatory Visit: Payer: Self-pay | Admitting: Nurse Practitioner

## 2018-01-28 DIAGNOSIS — K754 Autoimmune hepatitis: Secondary | ICD-10-CM

## 2018-02-12 ENCOUNTER — Other Ambulatory Visit: Payer: Self-pay

## 2018-03-06 ENCOUNTER — Ambulatory Visit
Admission: RE | Admit: 2018-03-06 | Discharge: 2018-03-06 | Disposition: A | Discharge status: Home / Self Care | Payer: 59 | Source: Ambulatory Visit | Attending: Nurse Practitioner | Admitting: Nurse Practitioner

## 2018-03-06 DIAGNOSIS — K754 Autoimmune hepatitis: Secondary | ICD-10-CM

## 2018-03-12 ENCOUNTER — Other Ambulatory Visit: Payer: Self-pay | Admitting: Nurse Practitioner

## 2018-03-12 DIAGNOSIS — K7689 Other specified diseases of liver: Secondary | ICD-10-CM

## 2018-03-25 ENCOUNTER — Other Ambulatory Visit: Payer: 59

## 2018-03-26 ENCOUNTER — Ambulatory Visit
Admission: RE | Admit: 2018-03-26 | Discharge: 2018-03-26 | Disposition: A | Discharge status: Home / Self Care | Payer: 59 | Source: Ambulatory Visit | Attending: Nurse Practitioner | Admitting: Nurse Practitioner

## 2018-03-26 DIAGNOSIS — K7689 Other specified diseases of liver: Secondary | ICD-10-CM

## 2018-03-26 MED ORDER — IOPAMIDOL (ISOVUE-300) INJECTION 61%
125.0000 mL | Freq: Once | INTRAVENOUS | Status: AC | PRN
  Administered 2018-03-26: 125 mL via INTRAVENOUS

## 2018-07-23 DIAGNOSIS — M654 Radial styloid tenosynovitis [de Quervain]: Secondary | ICD-10-CM | POA: Diagnosis not present

## 2018-11-27 DIAGNOSIS — E039 Hypothyroidism, unspecified: Secondary | ICD-10-CM | POA: Diagnosis not present

## 2018-11-27 DIAGNOSIS — Z0000 Encounter for general adult medical examination without abnormal findings: Secondary | ICD-10-CM | POA: Diagnosis not present

## 2018-11-30 DIAGNOSIS — E039 Hypothyroidism, unspecified: Secondary | ICD-10-CM | POA: Diagnosis not present

## 2018-11-30 DIAGNOSIS — Z23 Encounter for immunization: Secondary | ICD-10-CM | POA: Diagnosis not present

## 2018-11-30 DIAGNOSIS — Z0000 Encounter for general adult medical examination without abnormal findings: Secondary | ICD-10-CM | POA: Diagnosis not present

## 2018-11-30 DIAGNOSIS — E7800 Pure hypercholesterolemia, unspecified: Secondary | ICD-10-CM | POA: Diagnosis not present

## 2018-11-30 DIAGNOSIS — N529 Male erectile dysfunction, unspecified: Secondary | ICD-10-CM | POA: Diagnosis not present

## 2019-02-01 ENCOUNTER — Other Ambulatory Visit: Payer: Self-pay | Admitting: Nurse Practitioner

## 2019-02-01 DIAGNOSIS — K754 Autoimmune hepatitis: Secondary | ICD-10-CM

## 2019-02-12 ENCOUNTER — Other Ambulatory Visit: Payer: 59

## 2019-02-15 ENCOUNTER — Ambulatory Visit
Admission: RE | Admit: 2019-02-15 | Discharge: 2019-02-15 | Disposition: A | Discharge status: Home / Self Care | Payer: Self-pay | Source: Ambulatory Visit | Attending: Nurse Practitioner | Admitting: Nurse Practitioner

## 2019-02-15 DIAGNOSIS — K7689 Other specified diseases of liver: Secondary | ICD-10-CM | POA: Diagnosis not present

## 2019-02-15 DIAGNOSIS — K828 Other specified diseases of gallbladder: Secondary | ICD-10-CM | POA: Diagnosis not present

## 2019-02-15 DIAGNOSIS — K754 Autoimmune hepatitis: Secondary | ICD-10-CM

## 2019-10-15 DIAGNOSIS — N529 Male erectile dysfunction, unspecified: Secondary | ICD-10-CM | POA: Diagnosis not present

## 2019-10-15 DIAGNOSIS — E039 Hypothyroidism, unspecified: Secondary | ICD-10-CM | POA: Diagnosis not present

## 2019-10-19 DIAGNOSIS — E039 Hypothyroidism, unspecified: Secondary | ICD-10-CM | POA: Diagnosis not present

## 2019-10-19 DIAGNOSIS — Z23 Encounter for immunization: Secondary | ICD-10-CM | POA: Diagnosis not present

## 2020-02-17 DIAGNOSIS — K754 Autoimmune hepatitis: Secondary | ICD-10-CM | POA: Diagnosis not present

## 2020-02-22 ENCOUNTER — Other Ambulatory Visit: Payer: Self-pay | Admitting: Nurse Practitioner

## 2020-02-22 DIAGNOSIS — K754 Autoimmune hepatitis: Secondary | ICD-10-CM

## 2020-02-25 ENCOUNTER — Ambulatory Visit
Admission: RE | Admit: 2020-02-25 | Discharge: 2020-02-25 | Disposition: A | Discharge status: Home / Self Care | Payer: BC Managed Care – PPO | Source: Ambulatory Visit | Attending: Nurse Practitioner | Admitting: Nurse Practitioner

## 2020-02-25 DIAGNOSIS — K754 Autoimmune hepatitis: Secondary | ICD-10-CM

## 2020-02-25 DIAGNOSIS — K8020 Calculus of gallbladder without cholecystitis without obstruction: Secondary | ICD-10-CM | POA: Diagnosis not present

## 2020-02-25 DIAGNOSIS — K7689 Other specified diseases of liver: Secondary | ICD-10-CM | POA: Diagnosis not present

## 2020-02-28 DIAGNOSIS — K754 Autoimmune hepatitis: Secondary | ICD-10-CM | POA: Diagnosis not present

## 2020-03-07 DIAGNOSIS — Z0000 Encounter for general adult medical examination without abnormal findings: Secondary | ICD-10-CM | POA: Diagnosis not present

## 2020-03-14 DIAGNOSIS — Z862 Personal history of diseases of the blood and blood-forming organs and certain disorders involving the immune mechanism: Secondary | ICD-10-CM | POA: Diagnosis not present

## 2020-03-14 DIAGNOSIS — E7800 Pure hypercholesterolemia, unspecified: Secondary | ICD-10-CM | POA: Diagnosis not present

## 2020-03-14 DIAGNOSIS — Z0000 Encounter for general adult medical examination without abnormal findings: Secondary | ICD-10-CM | POA: Diagnosis not present

## 2020-03-14 DIAGNOSIS — Z125 Encounter for screening for malignant neoplasm of prostate: Secondary | ICD-10-CM | POA: Diagnosis not present

## 2020-03-14 DIAGNOSIS — E039 Hypothyroidism, unspecified: Secondary | ICD-10-CM | POA: Diagnosis not present

## 2020-06-16 DIAGNOSIS — E039 Hypothyroidism, unspecified: Secondary | ICD-10-CM | POA: Diagnosis not present

## 2020-07-21 DIAGNOSIS — K754 Autoimmune hepatitis: Secondary | ICD-10-CM | POA: Diagnosis not present

## 2020-07-31 DIAGNOSIS — M9902 Segmental and somatic dysfunction of thoracic region: Secondary | ICD-10-CM | POA: Diagnosis not present

## 2020-07-31 DIAGNOSIS — M531 Cervicobrachial syndrome: Secondary | ICD-10-CM | POA: Diagnosis not present

## 2020-07-31 DIAGNOSIS — M5032 Other cervical disc degeneration, mid-cervical region, unspecified level: Secondary | ICD-10-CM | POA: Diagnosis not present

## 2020-07-31 DIAGNOSIS — M9901 Segmental and somatic dysfunction of cervical region: Secondary | ICD-10-CM | POA: Diagnosis not present

## 2020-08-01 DIAGNOSIS — M9902 Segmental and somatic dysfunction of thoracic region: Secondary | ICD-10-CM | POA: Diagnosis not present

## 2020-08-01 DIAGNOSIS — M9901 Segmental and somatic dysfunction of cervical region: Secondary | ICD-10-CM | POA: Diagnosis not present

## 2020-08-01 DIAGNOSIS — M531 Cervicobrachial syndrome: Secondary | ICD-10-CM | POA: Diagnosis not present

## 2020-08-01 DIAGNOSIS — M5032 Other cervical disc degeneration, mid-cervical region, unspecified level: Secondary | ICD-10-CM | POA: Diagnosis not present

## 2020-08-07 DIAGNOSIS — M5032 Other cervical disc degeneration, mid-cervical region, unspecified level: Secondary | ICD-10-CM | POA: Diagnosis not present

## 2020-08-07 DIAGNOSIS — M9901 Segmental and somatic dysfunction of cervical region: Secondary | ICD-10-CM | POA: Diagnosis not present

## 2020-08-07 DIAGNOSIS — M9902 Segmental and somatic dysfunction of thoracic region: Secondary | ICD-10-CM | POA: Diagnosis not present

## 2020-08-07 DIAGNOSIS — M531 Cervicobrachial syndrome: Secondary | ICD-10-CM | POA: Diagnosis not present

## 2020-08-09 DIAGNOSIS — M9901 Segmental and somatic dysfunction of cervical region: Secondary | ICD-10-CM | POA: Diagnosis not present

## 2020-08-09 DIAGNOSIS — M531 Cervicobrachial syndrome: Secondary | ICD-10-CM | POA: Diagnosis not present

## 2020-08-09 DIAGNOSIS — M5032 Other cervical disc degeneration, mid-cervical region, unspecified level: Secondary | ICD-10-CM | POA: Diagnosis not present

## 2020-08-09 DIAGNOSIS — M9902 Segmental and somatic dysfunction of thoracic region: Secondary | ICD-10-CM | POA: Diagnosis not present

## 2020-08-14 DIAGNOSIS — M5032 Other cervical disc degeneration, mid-cervical region, unspecified level: Secondary | ICD-10-CM | POA: Diagnosis not present

## 2020-08-14 DIAGNOSIS — M531 Cervicobrachial syndrome: Secondary | ICD-10-CM | POA: Diagnosis not present

## 2020-08-14 DIAGNOSIS — M9901 Segmental and somatic dysfunction of cervical region: Secondary | ICD-10-CM | POA: Diagnosis not present

## 2020-08-14 DIAGNOSIS — M9902 Segmental and somatic dysfunction of thoracic region: Secondary | ICD-10-CM | POA: Diagnosis not present

## 2020-08-16 DIAGNOSIS — M531 Cervicobrachial syndrome: Secondary | ICD-10-CM | POA: Diagnosis not present

## 2020-08-16 DIAGNOSIS — M5032 Other cervical disc degeneration, mid-cervical region, unspecified level: Secondary | ICD-10-CM | POA: Diagnosis not present

## 2020-08-16 DIAGNOSIS — M9902 Segmental and somatic dysfunction of thoracic region: Secondary | ICD-10-CM | POA: Diagnosis not present

## 2020-08-16 DIAGNOSIS — M9901 Segmental and somatic dysfunction of cervical region: Secondary | ICD-10-CM | POA: Diagnosis not present

## 2020-08-21 DIAGNOSIS — M9902 Segmental and somatic dysfunction of thoracic region: Secondary | ICD-10-CM | POA: Diagnosis not present

## 2020-08-21 DIAGNOSIS — K760 Fatty (change of) liver, not elsewhere classified: Secondary | ICD-10-CM | POA: Diagnosis not present

## 2020-08-21 DIAGNOSIS — M5032 Other cervical disc degeneration, mid-cervical region, unspecified level: Secondary | ICD-10-CM | POA: Diagnosis not present

## 2020-08-21 DIAGNOSIS — M531 Cervicobrachial syndrome: Secondary | ICD-10-CM | POA: Diagnosis not present

## 2020-08-21 DIAGNOSIS — K754 Autoimmune hepatitis: Secondary | ICD-10-CM | POA: Diagnosis not present

## 2020-08-21 DIAGNOSIS — M9901 Segmental and somatic dysfunction of cervical region: Secondary | ICD-10-CM | POA: Diagnosis not present

## 2020-09-21 DIAGNOSIS — E039 Hypothyroidism, unspecified: Secondary | ICD-10-CM | POA: Diagnosis not present

## 2020-09-21 DIAGNOSIS — Z23 Encounter for immunization: Secondary | ICD-10-CM | POA: Diagnosis not present

## 2021-01-15 DIAGNOSIS — Z125 Encounter for screening for malignant neoplasm of prostate: Secondary | ICD-10-CM | POA: Diagnosis not present

## 2021-01-15 DIAGNOSIS — E039 Hypothyroidism, unspecified: Secondary | ICD-10-CM | POA: Diagnosis not present

## 2021-01-15 DIAGNOSIS — E7800 Pure hypercholesterolemia, unspecified: Secondary | ICD-10-CM | POA: Diagnosis not present

## 2021-01-15 DIAGNOSIS — R7309 Other abnormal glucose: Secondary | ICD-10-CM | POA: Diagnosis not present

## 2021-01-15 DIAGNOSIS — R5383 Other fatigue: Secondary | ICD-10-CM | POA: Diagnosis not present

## 2021-02-21 ENCOUNTER — Other Ambulatory Visit: Payer: Self-pay | Admitting: Nurse Practitioner

## 2021-02-21 DIAGNOSIS — Z9225 Personal history of immunosupression therapy: Secondary | ICD-10-CM | POA: Diagnosis not present

## 2021-02-21 DIAGNOSIS — K7689 Other specified diseases of liver: Secondary | ICD-10-CM | POA: Diagnosis not present

## 2021-02-21 DIAGNOSIS — K760 Fatty (change of) liver, not elsewhere classified: Secondary | ICD-10-CM | POA: Diagnosis not present

## 2021-02-21 DIAGNOSIS — K754 Autoimmune hepatitis: Secondary | ICD-10-CM | POA: Diagnosis not present

## 2021-02-21 DIAGNOSIS — K7401 Hepatic fibrosis, early fibrosis: Secondary | ICD-10-CM | POA: Diagnosis not present

## 2021-03-07 ENCOUNTER — Ambulatory Visit
Admission: RE | Admit: 2021-03-07 | Discharge: 2021-03-07 | Disposition: A | Discharge status: Home / Self Care | Payer: BC Managed Care – PPO | Source: Ambulatory Visit | Attending: Nurse Practitioner | Admitting: Nurse Practitioner

## 2021-03-07 ENCOUNTER — Other Ambulatory Visit: Payer: Self-pay

## 2021-03-07 DIAGNOSIS — K7689 Other specified diseases of liver: Secondary | ICD-10-CM

## 2021-03-15 DIAGNOSIS — Z0000 Encounter for general adult medical examination without abnormal findings: Secondary | ICD-10-CM | POA: Diagnosis not present

## 2021-05-08 ENCOUNTER — Encounter: Payer: Self-pay | Admitting: Interventional Cardiology

## 2021-05-08 ENCOUNTER — Other Ambulatory Visit: Payer: Self-pay

## 2021-05-08 ENCOUNTER — Ambulatory Visit: Payer: BC Managed Care – PPO | Admitting: Interventional Cardiology

## 2021-05-08 VITALS — BP 140/86 | HR 60 | Ht 75.0 in | Wt 250.8 lb

## 2021-05-08 DIAGNOSIS — E782 Mixed hyperlipidemia: Secondary | ICD-10-CM

## 2021-05-08 DIAGNOSIS — D849 Immunodeficiency, unspecified: Secondary | ICD-10-CM

## 2021-05-08 DIAGNOSIS — R030 Elevated blood-pressure reading, without diagnosis of hypertension: Secondary | ICD-10-CM | POA: Diagnosis not present

## 2021-05-08 DIAGNOSIS — I517 Cardiomegaly: Secondary | ICD-10-CM | POA: Diagnosis not present

## 2021-05-08 DIAGNOSIS — N529 Male erectile dysfunction, unspecified: Secondary | ICD-10-CM

## 2021-05-08 DIAGNOSIS — I447 Left bundle-branch block, unspecified: Secondary | ICD-10-CM

## 2021-05-08 DIAGNOSIS — K754 Autoimmune hepatitis: Secondary | ICD-10-CM

## 2021-05-08 DIAGNOSIS — E039 Hypothyroidism, unspecified: Secondary | ICD-10-CM

## 2021-05-08 NOTE — Patient Instructions (Signed)
Medication Instructions:  Your physician recommends that you continue on your current medications as directed. Please refer to the Current Medication list given to you today.  *If you need a refill on your cardiac medications before your next appointment, please call your pharmacy*   Lab Work: None If you have labs (blood work) drawn today and your tests are completely normal, you will receive your results only by: . MyChart Message (if you have MyChart) OR . A paper copy in the mail If you have any lab test that is abnormal or we need to change your treatment, we will call you to review the results.   Testing/Procedures: Your physician has requested that you have an echocardiogram. Echocardiography is a painless test that uses sound waves to create images of your heart. It provides your doctor with information about the size and shape of your heart and how well your heart's chambers and valves are working. This procedure takes approximately one hour. There are no restrictions for this procedure.  Your physician recommends that you have a Calcium Score performed.   Follow-Up: At [facility], you and your health needs are our priority.  As part of our continuing mission to provide you with exceptional heart care, we have created designated Provider Care Teams.  These Care Teams include your primary Cardiologist (physician) and Advanced Practice Providers (APPs -  Physician Assistants and Nurse Practitioners) who all work together to provide you with the care you need, when you need it.  We recommend signing up for the patient portal called "MyChart".  Sign up information is provided on this After Visit Summary.  MyChart is used to connect with patients for Virtual Visits (Telemedicine).  Patients are able to view lab/test results, encounter notes, upcoming appointments, etc.  Non-urgent messages can be sent to your provider as well.   To learn more about what you can do with MyChart, go to  [email or web address].    Your next appointment:   As needed  The format for your next appointment:   In Person  Provider:   You may see Dr. Henry Smith or one of the following Advanced Practice Providers on your designated Care Team:    Jill McDaniel, NP    Other Instructions   

## 2021-05-08 NOTE — Progress Notes (Signed)
Cardiology Office Note:    Date:  05/08/2021   ID:  Alex West, DOB 1956-04-30, MRN [id number]  PCP:  Lawerance Cruel, MD  Cardiologist:  None   Referring MD: Lawerance Cruel, MD   Chief Complaint  Patient presents with  . Advice Only    Cardiac enlargement Left bundle branch block Erectile dysfunction    History of Present Illness:    Alex West is a 65 y.o. male with a hx of h/o autoimmune hepatitis, hypothyroidism, erectile dysfunction, hyperlipidemia, mitral regurgitation and cardiomegaly. Referred by Dr. Cristi Loron for cardiac evaluation..  I first met this gentleman 15 years ago.  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  He was concerned of the time of possibly having a heart problem since his mother had a history of enlarged heart.  Evaluation did not identify any particular problem other than hypothyroidism.  He is referred by Dr. Harrington Challenger for cardiac evaluation.  The patient is concerned he may be developing cardiac problems although he has no specific symptoms.  He works out 5 days/week and has no limitations.  He denies orthopnea, PND, exertional intolerance, palpitations, syncope, and is never had a heart attack or any particular vascular event.  Past Medical History:  Diagnosis Date  . BPH (benign prostatic hyperplasia)   . Colon polyp   . ED (erectile dysfunction)   . Elevated liver enzymes   . Gallstones   . Hepatitis   . History of anemia   . Hypercholesterolemia   . Hypothyroidism   . Mild tricuspid regurgitation   . Mitral valve regurgitation   . Thyroid disease   . Vertigo     Past Surgical History:  Procedure Laterality Date  . FLEXIBLE SIGMOIDOSCOPY      Current Medications: Current Meds  Medication Sig  . azaTHIOprine (IMURAN) 50 MG tablet Take 50 mg by mouth daily.  Marland Kitchen levothyroxine (SYNTHROID, LEVOTHROID) 150 MCG tablet Take 150 mcg by mouth daily.  . sildenafil (VIAGRA) 100 MG tablet Take 100 mg by mouth as needed for erectile  dysfunction.   Current Facility-Administered Medications for the 05/08/21 encounter (Office Visit) with Belva Crome, MD  Medication  . acetaminophen (TYLENOL) tablet 975 mg     Allergies:   Patient has no known allergies.   Social History   Socioeconomic History  . Marital status: Married    Spouse name: Not on file  . Number of children: Not on file  . Years of education: Not on file  . Highest education level: Not on file  Occupational History  . Occupation: Sales  Tobacco Use  . Smoking status: Former Research scientist (life sciences)  . Smokeless tobacco: Never Used  Substance and Sexual Activity  . Alcohol use: No  . Drug use: No  . Sexual activity: Not on file  Other Topics Concern  . Not on file  Social History Narrative  . Not on file   Social Determinants of Health   Financial Resource Strain: Not on file  Food Insecurity: Not on file  Transportation Needs: Not on file  Physical Activity: Not on file  Stress: Not on file  Social Connections: Not on file     Family History: The patient's family history includes Cancer in his father and mother; Diabetes in his brother; Hypertension in his brother and sister; Kidney failure in his brother; Lupus in his daughter; Other in his brother and son.'s daughter died of lupus.  Her son died in an accident.  Mother died of  pancreatic cancer.  Did not know his father.  ROS:   Please see the history of present illness.    He has been diagnosed with autoimmune liver disease (Dr. Cristina Gong), has an active diagnosis of hypothyroidism.  All other systems reviewed and are negative.  EKGs/Labs/Other Studies Reviewed:    The following studies were reviewed today: No cardiac data is available.  EKG:  EKG reveals sinus rhythm, left bundle branch block, with left axis deviation.  Recent Labs: No results found for requested labs within last 8760 hours.  Recent Lipid Panel No results found for: CHOL, TRIG, HDL, CHOLHDL, VLDL, LDLCALC,  LDLDIRECT  Physical Exam:    VS:  BP 140/86   Pulse 60   Ht $R'6\' 3"'YO$  (1.905 m)   Wt 250 lb 12.8 oz (113.8 kg)   BMI 31.35 kg/m     Wt Readings from Last 3 Encounters:  05/08/21 250 lb 12.8 oz (113.8 kg)  09/05/14 265 lb 6.4 oz (120.4 kg)  03/08/13 260 lb (117.9 kg)     GEN: Healthy-appearing, slightly overweight.. No acute distress HEENT: Normal NECK: No JVD. LYMPHATICS: No lymphadenopathy CARDIAC: No murmur. RRR no gallop, or edema. VASCULAR:  Normal Pulses. No bruits. RESPIRATORY:  Clear to auscultation without rales, wheezing or rhonchi  ABDOMEN: Soft, non-tender, non-distended, No pulsatile mass, MUSCULOSKELETAL: No deformity  SKIN: Warm and dry NEUROLOGIC:  Alert and oriented x 3 PSYCHIATRIC:  Normal affect   ASSESSMENT:    1. Cardiomegaly   2. Elevated blood-pressure reading without diagnosis of hypertension   3. Left bundle branch block   4. Mixed hyperlipidemia   5. Erectile dysfunction, unspecified erectile dysfunction type   6. Autoimmune hepatitis ([place])   7. Immunosuppressed status ([place])   8. Hypothyroidism, unspecified type    PLAN:    In order of problems listed above:  1. No clinical evidence of cardiomegaly although he does have mildly elevated blood pressure.  A 2D Doppler echocardiogram will be done to assess heart size and function and hopefully rule out cardiac enlargement.  He does have left bundle branch block which can be associated with decreased LV function. 2. Target blood pressure for his age is 130/80 mmHg.  This will need follow-up. 3. This is a new diagnosis.  2D echo will be done to assess LV function. 4. His lipids are elevated and not being managed.  LDL is 124.  Coronary calcium score to assess for burden of atherosclerosis.  We will also help [place] to determine if more aggressive management is needed. 5. This is a risk factor for atherosclerosis.  Coronary calcium score will assist in assessment. 6. Theoretically, autoimmune hepatitis  could be a chronic inflammatory problem that increases the risk of vascular events.  2D Doppler echocardiogram and coronary calcium score be done to help further evaluate the patient for occult vascular disease and systolic dysfunction.  Follow-up will be dependent upon the developing database.   Medication Adjustments/Labs and Tests Ordered: Current medicines are reviewed at length with the patient today.  Concerns regarding medicines are outlined above.  Orders Placed This Encounter  Procedures  . CT CARDIAC SCORING (SELF PAY ONLY)  . EKG 12-Lead  . ECHOCARDIOGRAM COMPLETE   No orders of the defined types were placed in this encounter.   Patient Instructions  Medication Instructions:  Your physician recommends that you continue on your current medications as directed. Please refer to the Current Medication list given to you today.  *If you need a refill on your cardiac  medications before your next appointment, please call your pharmacy*   Lab Work: None If you have labs (blood work) drawn today and your tests are completely normal, you will receive your results only by: Marland Kitchen MyChart Message (if you have MyChart) OR . A paper copy in the mail If you have any lab test that is abnormal or we need to change your treatment, we will call you to review the results.   Testing/Procedures: Your physician has requested that you have an echocardiogram. Echocardiography is a painless test that uses sound waves to create images of your heart. It provides your doctor with information about the size and shape of your heart and how well your heart's chambers and valves are working. This procedure takes approximately one hour. There are no restrictions for this procedure.  Your physician recommends that you have a Calcium Score performed.    Follow-Up: At [facility], you and your health needs are our priority.  As part of our continuing mission to provide you with exceptional heart care, we  have created designated Provider Care Teams.  These Care Teams include your primary Cardiologist (physician) and Advanced Practice Providers (APPs -  Physician Assistants and Nurse Practitioners) who all work together to provide you with the care you need, when you need it.  We recommend signing up for the patient portal called "MyChart".  Sign up information is provided on this After Visit Summary.  MyChart is used to connect with patients for Virtual Visits (Telemedicine).  Patients are able to view lab/test results, encounter notes, upcoming appointments, etc.  Non-urgent messages can be sent to your provider as well.   To learn more about what you can do with MyChart, go to [email or web address].    Your next appointment:   As needed   The format for your next appointment:   In Person  Provider:   You may see Dr. Daneen Schick or one of the following Advanced Practice Providers on your designated Care Team:    Kathyrn Drown, NP    Other Instructions      Signed, Sinclair Grooms, MD  05/08/2021 3:29 PM    [place]

## 2021-06-11 ENCOUNTER — Ambulatory Visit (INDEPENDENT_AMBULATORY_CARE_PROVIDER_SITE_OTHER)
Admission: RE | Admit: 2021-06-11 | Discharge: 2021-06-11 | Disposition: A | Discharge status: Home / Self Care | Payer: Self-pay | Source: Ambulatory Visit | Attending: Interventional Cardiology | Admitting: Interventional Cardiology

## 2021-06-11 ENCOUNTER — Ambulatory Visit (HOSPITAL_COMMUNITY): Payer: BC Managed Care – PPO | Attending: Cardiology

## 2021-06-11 ENCOUNTER — Other Ambulatory Visit: Payer: Self-pay

## 2021-06-11 DIAGNOSIS — I517 Cardiomegaly: Secondary | ICD-10-CM

## 2021-06-11 LAB — ECHOCARDIOGRAM COMPLETE
Area-P 1/2: 2.69 cm2
S' Lateral: 3.2 cm

## 2021-06-15 DIAGNOSIS — E039 Hypothyroidism, unspecified: Secondary | ICD-10-CM | POA: Diagnosis not present

## 2021-07-23 NOTE — Progress Notes (Signed)
Supreme Cefalo - 65 y.o. male MRN [id number]  Date of birth: [birth year]  SUBJECTIVE:  Including CC & ROS.  No chief complaint on file.   Alex West is a 65 y.o. male that is presenting with left groin pain.  The pain is been ongoing for about a week.  Denies any specific injury or inciting event.  Feels the pain when he is lunging with his left foot backwards.  No history of similar pain.  Has not tried anything for the pain..   Review of Systems See HPI   HISTORY: Past Medical, Surgical, Social, and Family History Reviewed & Updated per EMR.   Pertinent Historical Findings include:  Past Medical History:  Diagnosis Date   BPH (benign prostatic hyperplasia)    Colon polyp    ED (erectile dysfunction)    Elevated liver enzymes    Gallstones    Hepatitis    History of anemia    Hypercholesterolemia    Hypothyroidism    Mild tricuspid regurgitation    Mitral valve regurgitation    Thyroid disease    Vertigo     Past Surgical History:  Procedure Laterality Date   FLEXIBLE SIGMOIDOSCOPY      Family History  Problem Relation Age of Onset   Cancer Mother        pancreatic   Cancer Father        stomach   Kidney failure Brother    Other Brother        neck tumor   Hypertension Brother    Diabetes Brother    Lupus Daughter    Hypertension Sister    Other Son        boating accident    Social History   Socioeconomic History   Marital status: Married    Spouse name: Not on file   Number of children: Not on file   Years of education: Not on file   Highest education level: Not on file  Occupational History   Occupation: Sales  Tobacco Use   Smoking status: Former   Smokeless tobacco: Never  Substance and Sexual Activity   Alcohol use: No   Drug use: No   Sexual activity: Not on file  Other Topics Concern   Not on file  Social History Narrative   Not on file   Social Determinants of Health   Financial Resource Strain: Not on file  Food Insecurity: Not  on file  Transportation Needs: Not on file  Physical Activity: Not on file  Stress: Not on file  Social Connections: Not on file  Intimate Partner Violence: Not on file     PHYSICAL EXAM:  VS: BP (!) 140/100 (BP Location: Left Arm, Patient Position: Sitting, Cuff Size: Large)   Ht '6\' 3"'$  (1.905 m)   Wt 245 lb (111.1 kg)   BMI 30.62 kg/m  Physical Exam Gen: NAD, alert, cooperative with exam, well-appearing MSK:  Left hip: Normal range of motion. Exacerbation of pain with leg extension. Normal strength resistance. Neurovascularly intact  Limited ultrasound: Left hip:  No changes appreciated of the adductor muscle belly. Normal AIIS and ASIS. No effusion within the left hip joint. Degenerative changes of the labrum appreciated  Summary: Degenerative labral changes.  Ultrasound and interpretation by Clearance Coots, MD    ASSESSMENT & PLAN:   Degenerative tear of acetabular labrum of left hip Symptoms seem most consistent with instability and changes of the labrum resulting in his pain.  No significant effusion on exam. -Counseled  on home exercise therapy and supportive care. -Mobic. -Could consider injection, physical therapy or imaging.

## 2021-07-24 ENCOUNTER — Ambulatory Visit: Payer: BC Managed Care – PPO | Admitting: Family Medicine

## 2021-07-24 ENCOUNTER — Ambulatory Visit: Payer: Self-pay

## 2021-07-24 ENCOUNTER — Encounter: Payer: Self-pay | Admitting: Family Medicine

## 2021-07-24 ENCOUNTER — Other Ambulatory Visit: Payer: Self-pay

## 2021-07-24 VITALS — BP 140/100 | Ht 75.0 in | Wt 245.0 lb

## 2021-07-24 DIAGNOSIS — M24152 Other articular cartilage disorders, left hip: Secondary | ICD-10-CM

## 2021-07-24 DIAGNOSIS — R1032 Left lower quadrant pain: Secondary | ICD-10-CM

## 2021-07-24 DIAGNOSIS — M169 Osteoarthritis of hip, unspecified: Secondary | ICD-10-CM | POA: Insufficient documentation

## 2021-07-24 MED ORDER — MELOXICAM 7.5 MG PO TABS: 7.5000 mg | ORAL_TABLET | Freq: Two times a day (BID) | ORAL | 1 refills | Status: DC | PRN

## 2021-07-24 NOTE — Assessment & Plan Note (Signed)
Symptoms seem most consistent with instability and changes of the labrum resulting in his pain.  No significant effusion on exam. -Counseled on home exercise therapy and supportive care. -Mobic. -Could consider injection, physical therapy or imaging.

## 2021-07-24 NOTE — Patient Instructions (Signed)
Nice to meet you Please try ice  Please try the exercises  Please try the mobic as needed  Please send me a message in MyChart with any questions or updates.  Please see me back in 3 weeks.   --Dr. Raeford Razor

## 2021-08-09 DIAGNOSIS — Z01818 Encounter for other preprocedural examination: Secondary | ICD-10-CM | POA: Diagnosis not present

## 2021-08-14 ENCOUNTER — Other Ambulatory Visit: Payer: Self-pay

## 2021-08-14 ENCOUNTER — Encounter: Payer: Self-pay | Admitting: Family Medicine

## 2021-08-14 ENCOUNTER — Ambulatory Visit (HOSPITAL_BASED_OUTPATIENT_CLINIC_OR_DEPARTMENT_OTHER)
Admission: RE | Admit: 2021-08-14 | Discharge: 2021-08-14 | Disposition: A | Discharge status: Home / Self Care | Payer: BC Managed Care – PPO | Source: Ambulatory Visit | Attending: Family Medicine | Admitting: Family Medicine

## 2021-08-14 ENCOUNTER — Ambulatory Visit: Payer: BC Managed Care – PPO | Admitting: Family Medicine

## 2021-08-14 VITALS — BP 138/84 | Ht 75.0 in | Wt 245.0 lb

## 2021-08-14 DIAGNOSIS — M24152 Other articular cartilage disorders, left hip: Secondary | ICD-10-CM | POA: Insufficient documentation

## 2021-08-14 DIAGNOSIS — M25551 Pain in right hip: Secondary | ICD-10-CM | POA: Diagnosis not present

## 2021-08-14 NOTE — Assessment & Plan Note (Signed)
Has gotten improvement over the past few weeks.  Has pain intermittently and at the end of the day. -Counseled on home exercise therapy and supportive care. -Continue meloxicam. -X-ray. -Could consider injection or physical therapy

## 2021-08-14 NOTE — Progress Notes (Signed)
  Alex West - 65 y.o. male MRN [id number]  Date of birth: 24-Nov-1956  SUBJECTIVE:  Including CC & ROS.  No chief complaint on file.   Alex West is a 65 y.o. male that is following up for his left hip pain.  Has gotten improvement of his range of motion but still feels the pain intermittently.  Has been staying active with using the elliptical.    Review of Systems See HPI   HISTORY: Past Medical, Surgical, Social, and Family History Reviewed & Updated per EMR.   Pertinent Historical Findings include:  Past Medical History:  Diagnosis Date   BPH (benign prostatic hyperplasia)    Colon polyp    ED (erectile dysfunction)    Elevated liver enzymes    Gallstones    Hepatitis    History of anemia    Hypercholesterolemia    Hypothyroidism    Mild tricuspid regurgitation    Mitral valve regurgitation    Thyroid disease    Vertigo     Past Surgical History:  Procedure Laterality Date   FLEXIBLE SIGMOIDOSCOPY      Family History  Problem Relation Age of Onset   Cancer Mother        pancreatic   Cancer Father        stomach   Kidney failure Brother    Other Brother        neck tumor   Hypertension Brother    Diabetes Brother    Lupus Daughter    Hypertension Sister    Other Son        boating accident    Social History   Socioeconomic History   Marital status: Married    Spouse name: Not on file   Number of children: Not on file   Years of education: Not on file   Highest education level: Not on file  Occupational History   Occupation: Sales  Tobacco Use   Smoking status: Former   Smokeless tobacco: Never  Substance and Sexual Activity   Alcohol use: No   Drug use: No   Sexual activity: Not on file  Other Topics Concern   Not on file  Social History Narrative   Not on file   Social Determinants of Health   Financial Resource Strain: Not on file  Food Insecurity: Not on file  Transportation Needs: Not on file  Physical Activity: Not on file   Stress: Not on file  Social Connections: Not on file  Intimate Partner Violence: Not on file     PHYSICAL EXAM:  VS: BP 138/84 (BP Location: Left Arm, Patient Position: Sitting, Cuff Size: Large)   Ht '6\' 3"'$  (1.905 m)   Wt 245 lb (111.1 kg)   BMI 30.62 kg/m  Physical Exam Gen: NAD, alert, cooperative with exam, well-appearing      ASSESSMENT & PLAN:   Degenerative tear of acetabular labrum of left hip Has gotten improvement over the past few weeks.  Has pain intermittently and at the end of the day. -Counseled on home exercise therapy and supportive care. -Continue meloxicam. -X-ray. -Could consider injection or physical therapy

## 2021-08-14 NOTE — Patient Instructions (Signed)
Good to see you Please continue ice  Please continue the exercises  I will call with the results from today  Please send me a message in MyChart with any questions or updates.  Please see me back in 4 weeks.   --Dr. Raeford Razor

## 2021-08-15 ENCOUNTER — Telehealth: Payer: Self-pay | Admitting: Family Medicine

## 2021-08-15 NOTE — Telephone Encounter (Signed)
Informed of results.   Rosemarie Ax, MD [facility] 08/15/2021, 1:52 PM

## 2021-08-29 DIAGNOSIS — K7401 Hepatic fibrosis, early fibrosis: Secondary | ICD-10-CM | POA: Diagnosis not present

## 2021-08-29 DIAGNOSIS — D849 Immunodeficiency, unspecified: Secondary | ICD-10-CM | POA: Diagnosis not present

## 2021-08-29 DIAGNOSIS — K760 Fatty (change of) liver, not elsewhere classified: Secondary | ICD-10-CM | POA: Diagnosis not present

## 2021-08-29 DIAGNOSIS — K754 Autoimmune hepatitis: Secondary | ICD-10-CM | POA: Diagnosis not present

## 2021-08-30 ENCOUNTER — Other Ambulatory Visit: Payer: Self-pay | Admitting: Family Medicine

## 2021-09-05 DIAGNOSIS — Z86010 Personal history of colonic polyps: Secondary | ICD-10-CM | POA: Diagnosis not present

## 2021-09-17 ENCOUNTER — Ambulatory Visit: Payer: Self-pay

## 2021-09-17 ENCOUNTER — Ambulatory Visit: Payer: BC Managed Care – PPO | Admitting: Family Medicine

## 2021-09-17 VITALS — Ht 75.0 in | Wt 250.0 lb

## 2021-09-17 DIAGNOSIS — M24152 Other articular cartilage disorders, left hip: Secondary | ICD-10-CM

## 2021-09-17 MED ORDER — TRIAMCINOLONE ACETONIDE 40 MG/ML IJ SUSP
40.0000 mg | Freq: Once | INTRAMUSCULAR | Status: AC
  Administered 2021-09-17: 40 mg via INTRA_ARTICULAR

## 2021-09-17 NOTE — Progress Notes (Signed)
Alex West - 65 y.o. male MRN [id number]  Date of birth: 1956-06-27  SUBJECTIVE:  Including CC & ROS.  No chief complaint on file.   Alex West is a 65 y.o. male that is presenting with acute worsening of his left hip pain.  Pain is been ongoing for about 4 months now.  He has had home exercises with limited improvement.  He feels like it is catching and giving out on him intermittently..    Review of Systems See HPI   HISTORY: Past Medical, Surgical, Social, and Family History Reviewed & Updated per EMR.   Pertinent Historical Findings include:  Past Medical History:  Diagnosis Date   BPH (benign prostatic hyperplasia)    Colon polyp    ED (erectile dysfunction)    Elevated liver enzymes    Gallstones    Hepatitis    History of anemia    Hypercholesterolemia    Hypothyroidism    Mild tricuspid regurgitation    Mitral valve regurgitation    Thyroid disease    Vertigo     Past Surgical History:  Procedure Laterality Date   FLEXIBLE SIGMOIDOSCOPY      Family History  Problem Relation Age of Onset   Cancer Mother        pancreatic   Cancer Father        stomach   Kidney failure Brother    Other Brother        neck tumor   Hypertension Brother    Diabetes Brother    Lupus Daughter    Hypertension Sister    Other Son        boating accident    Social History   Socioeconomic History   Marital status: Married    Spouse name: Not on file   Number of children: Not on file   Years of education: Not on file   Highest education level: Not on file  Occupational History   Occupation: Sales  Tobacco Use   Smoking status: Former   Smokeless tobacco: Never  Substance and Sexual Activity   Alcohol use: No   Drug use: No   Sexual activity: Not on file  Other Topics Concern   Not on file  Social History Narrative   Not on file   Social Determinants of Health   Financial Resource Strain: Not on file  Food Insecurity: Not on file  Transportation Needs:  Not on file  Physical Activity: Not on file  Stress: Not on file  Social Connections: Not on file  Intimate Partner Violence: Not on file     PHYSICAL EXAM:  VS: Ht 6\' 3"  (1.905 m)   Wt 250 lb (113.4 kg)   BMI 31.25 kg/m  Physical Exam Gen: NAD, alert, cooperative with exam, well-appearing   Aspiration/Injection Procedure Note Alex West 12-14-1956  Procedure: Injection Indications: Left hip pain  Procedure Details Consent: Risks of procedure as well as the alternatives and risks of each were explained to the (patient/caregiver).  Consent for procedure obtained. Time Out: Verified patient identification, verified procedure, site/side was marked, verified correct patient position, special equipment/implants available, medications/allergies/relevent history reviewed, required imaging and test results available.  Performed.  The area was cleaned with iodine and alcohol swabs.    The left hip joint was injected using 3 cc 1% lidocaine on a 22-gauge 3-1/2 inch needle.  The syringe was switched larger container 1 cc's of 40 mg Kenalog and 4 cc's of 0.25% bupivacaine was injected.  Ultrasound was used.  Images were obtained in short views showing the injection.     A sterile dressing was applied.  Patient did tolerate procedure well.      ASSESSMENT & PLAN:   Degenerative tear of acetabular labrum of left hip Acute on chronic in nature.  Having some mechanical symptoms with his ambulation. -Counseled on home exercise therapy and supportive care. -Injection today. -We will pursue further imaging if needed.

## 2021-09-17 NOTE — Assessment & Plan Note (Signed)
Acute on chronic in nature.  Having some mechanical symptoms with his ambulation. -Counseled on home exercise therapy and supportive care. -Injection today. -We will pursue further imaging if needed.

## 2021-09-17 NOTE — Patient Instructions (Signed)
Good to see you Please use ice as needed  Please continue with the exercises   Please send me a message in MyChart with any questions or updates.  Please see me back in 4 weeks.   --Dr. Raeford Razor

## 2021-10-17 ENCOUNTER — Ambulatory Visit: Payer: BC Managed Care – PPO | Admitting: Family Medicine

## 2021-10-17 ENCOUNTER — Encounter: Payer: Self-pay | Admitting: Family Medicine

## 2021-10-17 DIAGNOSIS — M24152 Other articular cartilage disorders, left hip: Secondary | ICD-10-CM

## 2021-10-17 NOTE — Assessment & Plan Note (Signed)
Significant improvement with the injection. -Counseled on home exercise therapy and supportive care. -Could consider further imaging or physical therapy.

## 2021-10-17 NOTE — Progress Notes (Signed)
  Alex West - 65 y.o. male MRN [id number]  Date of birth: [birth year]  SUBJECTIVE:  Including CC & ROS.  No chief complaint on file.   Alex West is a 65 y.o. male that is following up for his left hip pain.  He is done significantly better since having the injection.  Reports he is having no limping and no pain.   Review of Systems See HPI   HISTORY: Past Medical, Surgical, Social, and Family History Reviewed & Updated per EMR.   Pertinent Historical Findings include:  Past Medical History:  Diagnosis Date   BPH (benign prostatic hyperplasia)    Colon polyp    ED (erectile dysfunction)    Elevated liver enzymes    Gallstones    Hepatitis    History of anemia    Hypercholesterolemia    Hypothyroidism    Mild tricuspid regurgitation    Mitral valve regurgitation    Thyroid disease    Vertigo     Past Surgical History:  Procedure Laterality Date   FLEXIBLE SIGMOIDOSCOPY      Family History  Problem Relation Age of Onset   Cancer Mother        pancreatic   Cancer Father        stomach   Kidney failure Brother    Other Brother        neck tumor   Hypertension Brother    Diabetes Brother    Lupus Daughter    Hypertension Sister    Other Son        boating accident    Social History   Socioeconomic History   Marital status: Married    Spouse name: Not on file   Number of children: Not on file   Years of education: Not on file   Highest education level: Not on file  Occupational History   Occupation: Sales  Tobacco Use   Smoking status: Former   Smokeless tobacco: Never  Substance and Sexual Activity   Alcohol use: No   Drug use: No   Sexual activity: Not on file  Other Topics Concern   Not on file  Social History Narrative   Not on file   Social Determinants of Health   Financial Resource Strain: Not on file  Food Insecurity: Not on file  Transportation Needs: Not on file  Physical Activity: Not on file  Stress: Not on file  Social  Connections: Not on file  Intimate Partner Violence: Not on file     PHYSICAL EXAM:  VS: BP 118/80 (BP Location: Left Arm, Patient Position: Sitting)   Ht 6\' 3"  (1.905 m)   Wt 250 lb (113.4 kg)   BMI 31.25 kg/m  Physical Exam Gen: NAD, alert, cooperative with exam, well-appearing      ASSESSMENT & PLAN:   Degenerative tear of acetabular labrum of left hip Significant improvement with the injection. -Counseled on home exercise therapy and supportive care. -Could consider further imaging or physical therapy.

## 2021-12-10 ENCOUNTER — Ambulatory Visit: Payer: BC Managed Care – PPO | Admitting: Family Medicine

## 2021-12-10 ENCOUNTER — Encounter: Payer: Self-pay | Admitting: Family Medicine

## 2021-12-10 VITALS — BP 120/88 | Ht 75.0 in | Wt 250.0 lb

## 2021-12-10 DIAGNOSIS — M24152 Other articular cartilage disorders, left hip: Secondary | ICD-10-CM | POA: Diagnosis not present

## 2021-12-10 NOTE — Progress Notes (Signed)
°  Alex West - 65 y.o. male MRN [id number]  Date of birth: February 12, 1956  SUBJECTIVE:  Including CC & ROS.  No chief complaint on file.   Alex West is a 65 y.o. male that is presenting with acute on chronic left hip pain.  The pain has been ongoing since August.  He is trying physical therapy over 6 weeks of physician monitored home exercise therapy as well as a left hip injection.  He has tried medications and pain is ongoing.  Having mechanical symptoms with deep flexion.   Review of Systems See HPI   HISTORY: Past Medical, Surgical, Social, and Family History Reviewed & Updated per EMR.   Pertinent Historical Findings include:  Past Medical History:  Diagnosis Date   BPH (benign prostatic hyperplasia)    Colon polyp    ED (erectile dysfunction)    Elevated liver enzymes    Gallstones    Hepatitis    History of anemia    Hypercholesterolemia    Hypothyroidism    Mild tricuspid regurgitation    Mitral valve regurgitation    Thyroid disease    Vertigo     Past Surgical History:  Procedure Laterality Date   FLEXIBLE SIGMOIDOSCOPY      Family History  Problem Relation Age of Onset   Cancer Mother        pancreatic   Cancer Father        stomach   Kidney failure Brother    Other Brother        neck tumor   Hypertension Brother    Diabetes Brother    Lupus Daughter    Hypertension Sister    Other Son        boating accident    Social History   Socioeconomic History   Marital status: Married    Spouse name: Not on file   Number of children: Not on file   Years of education: Not on file   Highest education level: Not on file  Occupational History   Occupation: Sales  Tobacco Use   Smoking status: Former   Smokeless tobacco: Never  Substance and Sexual Activity   Alcohol use: No   Drug use: No   Sexual activity: Not on file  Other Topics Concern   Not on file  Social History Narrative   Not on file   Social Determinants of Health   Financial  Resource Strain: Not on file  Food Insecurity: Not on file  Transportation Needs: Not on file  Physical Activity: Not on file  Stress: Not on file  Social Connections: Not on file  Intimate Partner Violence: Not on file     PHYSICAL EXAM:  VS: BP 120/88 (BP Location: Left Arm, Patient Position: Sitting)    Ht 6\' 3"  (1.905 m)    Wt 250 lb (113.4 kg)    BMI 31.25 kg/m  Physical Exam Gen: NAD, alert, cooperative with exam, well-appearing    ASSESSMENT & PLAN:   Degenerative tear of acetabular labrum of left hip Acute on chronic in nature.  Having mechanical symptoms and pain most consistent with labrum. -Counseled on home exercise therapy and supportive care. -MRI of the left hip to evaluate for labral tear and degenerative changes.

## 2021-12-10 NOTE — Assessment & Plan Note (Signed)
Acute on chronic in nature.  Having mechanical symptoms and pain most consistent with labrum. -Counseled on home exercise therapy and supportive care. -MRI of the left hip to evaluate for labral tear and degenerative changes.

## 2021-12-10 NOTE — Patient Instructions (Signed)
Good to see you Please call [phone]   Please send me a message in MyChart with any questions or updates.  We'll schedule a virtual visit once the MRI is resulted.   --Dr. Raeford Razor

## 2021-12-29 ENCOUNTER — Other Ambulatory Visit: Payer: Self-pay

## 2021-12-29 ENCOUNTER — Ambulatory Visit
Admission: RE | Admit: 2021-12-29 | Discharge: 2021-12-29 | Disposition: A | Discharge status: Home / Self Care | Payer: BC Managed Care – PPO | Source: Ambulatory Visit | Attending: Family Medicine | Admitting: Family Medicine

## 2021-12-29 DIAGNOSIS — M24152 Other articular cartilage disorders, left hip: Secondary | ICD-10-CM

## 2021-12-29 DIAGNOSIS — M25552 Pain in left hip: Secondary | ICD-10-CM | POA: Diagnosis not present

## 2022-01-01 ENCOUNTER — Encounter: Payer: Self-pay | Admitting: Family Medicine

## 2022-01-01 ENCOUNTER — Telehealth (INDEPENDENT_AMBULATORY_CARE_PROVIDER_SITE_OTHER): Payer: BC Managed Care – PPO | Admitting: Family Medicine

## 2022-01-01 VITALS — Ht 75.0 in | Wt 250.0 lb

## 2022-01-01 DIAGNOSIS — M1612 Unilateral primary osteoarthritis, left hip: Secondary | ICD-10-CM | POA: Diagnosis not present

## 2022-01-01 NOTE — Progress Notes (Signed)
Virtual Visit via Video Note  I connected with Alex West on 01/01/22 at  1:10 PM EST by a video enabled telemedicine application and verified that I am speaking with the correct person using two identifiers.  Location: Patient: home Provider: office   I discussed the limitations of evaluation and management by telemedicine and the availability of in person appointments. The patient expressed understanding and agreed to proceed.  History of Present Illness:  Alex West is a 65 yo M that is following up after the MRI of his left hip.  This was demonstrating severe degenerative changes of the joint with a moderate effusion.   Observations/Objective:   Assessment and Plan:  OA left hip:  MRI was revealing for severe changes of the joint with effusion.  He continues to have pain intermittently and mild. -Counseled on home exercise therapy and supportive care. - Could consider prp or injection  Follow Up Instructions:    I discussed the assessment and treatment plan with the patient. The patient was provided an opportunity to ask questions and all were answered. The patient agreed with the plan and demonstrated an understanding of the instructions.   The patient was advised to call back or seek an in-person evaluation if the symptoms worsen or if the condition fails to improve as anticipated.    Clearance Coots, MD

## 2022-01-01 NOTE — Assessment & Plan Note (Signed)
MRI was revealing for severe changes of the joint with effusion.  He continues to have pain intermittently and mild. -Counseled on home exercise therapy and supportive care. - Could consider prp or injection

## 2022-01-25 DIAGNOSIS — N401 Enlarged prostate with lower urinary tract symptoms: Secondary | ICD-10-CM | POA: Diagnosis not present

## 2022-01-25 DIAGNOSIS — R350 Frequency of micturition: Secondary | ICD-10-CM | POA: Diagnosis not present

## 2022-01-25 DIAGNOSIS — R3914 Feeling of incomplete bladder emptying: Secondary | ICD-10-CM | POA: Diagnosis not present

## 2022-02-27 DIAGNOSIS — K7401 Hepatic fibrosis, early fibrosis: Secondary | ICD-10-CM | POA: Diagnosis not present

## 2022-02-27 DIAGNOSIS — D849 Immunodeficiency, unspecified: Secondary | ICD-10-CM | POA: Diagnosis not present

## 2022-02-27 DIAGNOSIS — K754 Autoimmune hepatitis: Secondary | ICD-10-CM | POA: Diagnosis not present

## 2022-02-27 DIAGNOSIS — K760 Fatty (change of) liver, not elsewhere classified: Secondary | ICD-10-CM | POA: Diagnosis not present

## 2022-02-28 ENCOUNTER — Other Ambulatory Visit: Payer: Self-pay | Admitting: Nurse Practitioner

## 2022-02-28 DIAGNOSIS — K754 Autoimmune hepatitis: Secondary | ICD-10-CM

## 2022-02-28 DIAGNOSIS — K7401 Hepatic fibrosis, early fibrosis: Secondary | ICD-10-CM

## 2022-02-28 DIAGNOSIS — K760 Fatty (change of) liver, not elsewhere classified: Secondary | ICD-10-CM

## 2022-03-07 ENCOUNTER — Other Ambulatory Visit: Payer: Self-pay

## 2022-03-07 ENCOUNTER — Ambulatory Visit
Admission: RE | Admit: 2022-03-07 | Discharge: 2022-03-07 | Disposition: A | Discharge status: Home / Self Care | Payer: BC Managed Care – PPO | Source: Ambulatory Visit | Attending: Nurse Practitioner | Admitting: Nurse Practitioner

## 2022-03-07 DIAGNOSIS — K760 Fatty (change of) liver, not elsewhere classified: Secondary | ICD-10-CM | POA: Diagnosis not present

## 2022-03-07 DIAGNOSIS — K8020 Calculus of gallbladder without cholecystitis without obstruction: Secondary | ICD-10-CM | POA: Diagnosis not present

## 2022-03-08 DIAGNOSIS — R351 Nocturia: Secondary | ICD-10-CM | POA: Diagnosis not present

## 2022-03-08 DIAGNOSIS — N401 Enlarged prostate with lower urinary tract symptoms: Secondary | ICD-10-CM | POA: Diagnosis not present

## 2022-03-08 DIAGNOSIS — R3914 Feeling of incomplete bladder emptying: Secondary | ICD-10-CM | POA: Diagnosis not present

## 2022-03-13 DIAGNOSIS — Z13220 Encounter for screening for lipoid disorders: Secondary | ICD-10-CM | POA: Diagnosis not present

## 2022-03-13 DIAGNOSIS — Z125 Encounter for screening for malignant neoplasm of prostate: Secondary | ICD-10-CM | POA: Diagnosis not present

## 2022-03-13 DIAGNOSIS — Z0000 Encounter for general adult medical examination without abnormal findings: Secondary | ICD-10-CM | POA: Diagnosis not present

## 2022-03-13 DIAGNOSIS — E039 Hypothyroidism, unspecified: Secondary | ICD-10-CM | POA: Diagnosis not present

## 2022-03-20 DIAGNOSIS — Z0000 Encounter for general adult medical examination without abnormal findings: Secondary | ICD-10-CM | POA: Diagnosis not present

## 2022-03-20 DIAGNOSIS — Z23 Encounter for immunization: Secondary | ICD-10-CM | POA: Diagnosis not present

## 2022-03-25 DIAGNOSIS — R350 Frequency of micturition: Secondary | ICD-10-CM | POA: Diagnosis not present

## 2022-03-25 DIAGNOSIS — N401 Enlarged prostate with lower urinary tract symptoms: Secondary | ICD-10-CM | POA: Diagnosis not present

## 2022-03-25 DIAGNOSIS — R351 Nocturia: Secondary | ICD-10-CM | POA: Diagnosis not present

## 2022-03-27 DIAGNOSIS — F4323 Adjustment disorder with mixed anxiety and depressed mood: Secondary | ICD-10-CM | POA: Diagnosis not present

## 2022-04-09 DIAGNOSIS — M531 Cervicobrachial syndrome: Secondary | ICD-10-CM | POA: Diagnosis not present

## 2022-04-09 DIAGNOSIS — M5032 Other cervical disc degeneration, mid-cervical region, unspecified level: Secondary | ICD-10-CM | POA: Diagnosis not present

## 2022-04-09 DIAGNOSIS — M9902 Segmental and somatic dysfunction of thoracic region: Secondary | ICD-10-CM | POA: Diagnosis not present

## 2022-04-09 DIAGNOSIS — M9901 Segmental and somatic dysfunction of cervical region: Secondary | ICD-10-CM | POA: Diagnosis not present

## 2022-04-10 ENCOUNTER — Ambulatory Visit: Payer: BC Managed Care – PPO | Admitting: Family Medicine

## 2022-04-10 ENCOUNTER — Encounter: Payer: Self-pay | Admitting: Family Medicine

## 2022-04-10 VITALS — BP 138/86 | Ht 75.0 in | Wt 250.0 lb

## 2022-04-10 DIAGNOSIS — M9902 Segmental and somatic dysfunction of thoracic region: Secondary | ICD-10-CM | POA: Diagnosis not present

## 2022-04-10 DIAGNOSIS — M9901 Segmental and somatic dysfunction of cervical region: Secondary | ICD-10-CM | POA: Diagnosis not present

## 2022-04-10 DIAGNOSIS — M1612 Unilateral primary osteoarthritis, left hip: Secondary | ICD-10-CM

## 2022-04-10 DIAGNOSIS — M5032 Other cervical disc degeneration, mid-cervical region, unspecified level: Secondary | ICD-10-CM | POA: Diagnosis not present

## 2022-04-10 DIAGNOSIS — M531 Cervicobrachial syndrome: Secondary | ICD-10-CM | POA: Diagnosis not present

## 2022-04-10 NOTE — Progress Notes (Signed)
?  Alex West - 66 y.o. male MRN [id number]  Date of birth: October 12, 1956 ? ?SUBJECTIVE:  Including CC & ROS.  ?No chief complaint on file. ? ? ?Alex West is a 66 y.o. male that is following up for his left hip pain.  Initially had some pain a few days ago but is feeling much better after some muscle energy techniques.  Has a history of significant osteoarthritis in the joint previously observed on x-ray and MRI.. ? ? ?Review of Systems ?See HPI  ? ?HISTORY: Past Medical, Surgical, Social, and Family History Reviewed & Updated per EMR.   ?Pertinent Historical Findings include: ? ?Past Medical History:  ?Diagnosis Date  ? BPH (benign prostatic hyperplasia)   ? Colon polyp   ? ED (erectile dysfunction)   ? Elevated liver enzymes   ? Gallstones   ? Hepatitis   ? History of anemia   ? Hypercholesterolemia   ? Hypothyroidism   ? Mild tricuspid regurgitation   ? Mitral valve regurgitation   ? Thyroid disease   ? Vertigo   ? ? ?Past Surgical History:  ?Procedure Laterality Date  ? FLEXIBLE SIGMOIDOSCOPY    ? ? ? ?PHYSICAL EXAM:  ?VS: BP 138/86 (BP Location: Left Arm, Patient Position: Sitting)   Ht '6\' 3"'$  (1.905 m)   Wt 250 lb (113.4 kg)   BMI 31.25 kg/m?  ?Physical Exam ?Gen: NAD, alert, cooperative with exam, well-appearing ?MSK:  ?Neurovascularly intact   ? ? ? ? ?ASSESSMENT & PLAN:  ? ?OA (osteoarthritis) of hip ?Acute on chronic in nature.  Was feeling more significant pain earlier this week. ?-Counseled on home exercise therapy and supportive care. ?-Referral to physical therapy. ?-Could consider injection if needed. ? ? ? ? ?

## 2022-04-10 NOTE — Assessment & Plan Note (Signed)
Acute on chronic in nature.  Was feeling more significant pain earlier this week. ?-Counseled on home exercise therapy and supportive care. ?-Referral to physical therapy. ?-Could consider injection if needed. ?

## 2022-04-15 DIAGNOSIS — M531 Cervicobrachial syndrome: Secondary | ICD-10-CM | POA: Diagnosis not present

## 2022-04-15 DIAGNOSIS — M9902 Segmental and somatic dysfunction of thoracic region: Secondary | ICD-10-CM | POA: Diagnosis not present

## 2022-04-15 DIAGNOSIS — M9901 Segmental and somatic dysfunction of cervical region: Secondary | ICD-10-CM | POA: Diagnosis not present

## 2022-04-15 DIAGNOSIS — M5032 Other cervical disc degeneration, mid-cervical region, unspecified level: Secondary | ICD-10-CM | POA: Diagnosis not present

## 2022-04-16 ENCOUNTER — Ambulatory Visit: Payer: BC Managed Care – PPO | Admitting: Physical Therapy

## 2022-04-17 DIAGNOSIS — M5032 Other cervical disc degeneration, mid-cervical region, unspecified level: Secondary | ICD-10-CM | POA: Diagnosis not present

## 2022-04-17 DIAGNOSIS — M9901 Segmental and somatic dysfunction of cervical region: Secondary | ICD-10-CM | POA: Diagnosis not present

## 2022-04-17 DIAGNOSIS — M9902 Segmental and somatic dysfunction of thoracic region: Secondary | ICD-10-CM | POA: Diagnosis not present

## 2022-04-17 DIAGNOSIS — M531 Cervicobrachial syndrome: Secondary | ICD-10-CM | POA: Diagnosis not present

## 2022-04-22 DIAGNOSIS — M5032 Other cervical disc degeneration, mid-cervical region, unspecified level: Secondary | ICD-10-CM | POA: Diagnosis not present

## 2022-04-22 DIAGNOSIS — M531 Cervicobrachial syndrome: Secondary | ICD-10-CM | POA: Diagnosis not present

## 2022-04-22 DIAGNOSIS — F4323 Adjustment disorder with mixed anxiety and depressed mood: Secondary | ICD-10-CM | POA: Diagnosis not present

## 2022-04-22 DIAGNOSIS — M9902 Segmental and somatic dysfunction of thoracic region: Secondary | ICD-10-CM | POA: Diagnosis not present

## 2022-04-22 DIAGNOSIS — M9901 Segmental and somatic dysfunction of cervical region: Secondary | ICD-10-CM | POA: Diagnosis not present

## 2022-04-24 DIAGNOSIS — M9901 Segmental and somatic dysfunction of cervical region: Secondary | ICD-10-CM | POA: Diagnosis not present

## 2022-04-24 DIAGNOSIS — M531 Cervicobrachial syndrome: Secondary | ICD-10-CM | POA: Diagnosis not present

## 2022-04-24 DIAGNOSIS — M9902 Segmental and somatic dysfunction of thoracic region: Secondary | ICD-10-CM | POA: Diagnosis not present

## 2022-04-24 DIAGNOSIS — M5032 Other cervical disc degeneration, mid-cervical region, unspecified level: Secondary | ICD-10-CM | POA: Diagnosis not present

## 2022-04-29 DIAGNOSIS — M9903 Segmental and somatic dysfunction of lumbar region: Secondary | ICD-10-CM | POA: Diagnosis not present

## 2022-04-29 DIAGNOSIS — M9905 Segmental and somatic dysfunction of pelvic region: Secondary | ICD-10-CM | POA: Diagnosis not present

## 2022-04-29 DIAGNOSIS — M9901 Segmental and somatic dysfunction of cervical region: Secondary | ICD-10-CM | POA: Diagnosis not present

## 2022-04-29 DIAGNOSIS — M9902 Segmental and somatic dysfunction of thoracic region: Secondary | ICD-10-CM | POA: Diagnosis not present

## 2022-05-06 ENCOUNTER — Other Ambulatory Visit: Payer: Self-pay | Admitting: *Deleted

## 2022-05-06 ENCOUNTER — Telehealth: Payer: Self-pay | Admitting: Family Medicine

## 2022-05-06 DIAGNOSIS — M9903 Segmental and somatic dysfunction of lumbar region: Secondary | ICD-10-CM | POA: Diagnosis not present

## 2022-05-06 DIAGNOSIS — M9901 Segmental and somatic dysfunction of cervical region: Secondary | ICD-10-CM | POA: Diagnosis not present

## 2022-05-06 DIAGNOSIS — M9905 Segmental and somatic dysfunction of pelvic region: Secondary | ICD-10-CM | POA: Diagnosis not present

## 2022-05-06 DIAGNOSIS — M9902 Segmental and somatic dysfunction of thoracic region: Secondary | ICD-10-CM | POA: Diagnosis not present

## 2022-05-06 DIAGNOSIS — M1612 Unilateral primary osteoarthritis, left hip: Secondary | ICD-10-CM

## 2022-05-06 NOTE — Therapy (Signed)
?OUTPATIENT PHYSICAL THERAPY LOWER EXTREMITY EVALUATION ? ? ?Patient Name: Alex West ?MRN: [id number] ?DOB:01-24-1956, 66 y.o., male ?Today's Date: 05/07/2022 ? ? PT End of Session - 05/07/22 1014   ? ? Visit Number 1   ? Date for PT Re-Evaluation 06/18/22   ? Authorization Type [employer] - VL: 60 (PT/OT/[place])   ? PT Start Time 1014   ? PT Stop Time 1108   ? PT Time Calculation (min) 54 min   ? Activity Tolerance Patient tolerated treatment well   ? Behavior During Therapy [facility] for tasks assessed/performed   ? ?  ?  ? ?  ? ? ?Past Medical History:  ?Diagnosis Date  ? BPH (benign prostatic hyperplasia)   ? Colon polyp   ? ED (erectile dysfunction)   ? Elevated liver enzymes   ? Gallstones   ? Hepatitis   ? History of anemia   ? Hypercholesterolemia   ? Hypothyroidism   ? Mild tricuspid regurgitation   ? Mitral valve regurgitation   ? Thyroid disease   ? Vertigo   ? ?Past Surgical History:  ?Procedure Laterality Date  ? FLEXIBLE SIGMOIDOSCOPY    ? ?Patient Active Problem List  ? Diagnosis Date Noted  ? OA (osteoarthritis) of hip 07/24/2021  ? Immunosuppressed status ([place]) 09/05/2014  ? Hepatitis, autoimmune ([place]) 09/05/2014  ? ? ?PCP: Lawerance Cruel, MD ? ?REFERRING PROVIDER: Rosemarie Ax, MD ? ?REFERRING DIAG: M16.12 (ICD-10-CM) - Primary osteoarthritis of left hip ? ?THERAPY DIAG:  ?Pain in left hip ? ?Difficulty in walking, not elsewhere classified ? ?Other abnormalities of gait and mobility ? ?Muscle weakness (generalized) ? ?ONSET DATE: Acute on chronic in nature. ? ?SUBJECTIVE:  ? ?SUBJECTIVE STATEMENT: ?Pt reports initial onset of L hip pain related to a slip on the slick floor while bowling in the fall of last year, at which time he felt like pulled a muscle. Pain has fluctuated since but has recently been getting worse. Imaging revealed some OA in the hip as well as other degenerative changes. He notes inability to get a full stride on the L due to tightness > pain. ? ?PERTINENT HISTORY: ?OA, BPH,  anemia, hypothyroidism, MVR & TVR, vertigo ? ?PAIN:  ?Are you having pain? No and Yes: NPRS scale: with walking up to 10/10 ?Pain location: anterior L hip ?Pain description: tearing sensation ?Aggravating factors: L hip extension while walking ?Relieving factors: shorter stride with walking - pain only with long stride on L ? ?PRECAUTIONS: None ? ?WEIGHT BEARING RESTRICTIONS No ? ?FALLS:  ?Has patient fallen in last 6 months? No ? ?LIVING ENVIRONMENT: ?Lives with: lives with their spouse ?Lives in: House/apartment ?Stairs: Yes: Internal: 14 steps; on right going up ?Has following equipment at home: None ? ?OCCUPATION: Retired ? ?PLOF: Independent and Leisure: working out at gym 5x/wk, runs a non-profit ? ?PATIENT GOALS "To get my stride back." ? ? ?OBJECTIVE:  ? ?DIAGNOSTIC FINDINGS:  ?12/29/21 L hip MRI: 1. Severe osteoarthritis of the left hip. Moderate left hip joint effusion.  2. Degeneration of the left labrum with maceration of the anterior labrum. ? ?PATIENT SURVEYS:  ?FOTO Hip = 63, predicted = 73 ? ?COGNITION: ? Overall cognitive status: Within functional limits for tasks assessed   ?  ?SENSATION: ?WFL ? ?MUSCLE LENGTH: ?Hamstrings: mild/mod tight L>R ?ITB: mod tight B ?Quads & hip flexors: mod tight L>R ?Piriformis: mod/severe tight L, mod tight R ? ? ?LE ROM: ?B hip ROM mild to moderately limited in all planes with  greatest limitation in L hip flexion and IR/ER ? ?LE MMT: ? ?MMT Right ?05/07/2022 Left ?05/07/2022  ?Hip flexion 5 5  ?Hip extension 4 4  ?Hip abduction 4- (pain) 4-  ?Hip adduction 4 4+  ?Hip internal rotation 4+ 4+  ?Hip external rotation 4+ 4+  ?Knee flexion 4+ 5  ?Knee extension 5 5  ?Ankle dorsiflexion 5 5  ?Ankle plantarflexion    ?Ankle inversion    ?Ankle eversion    ? (Blank rows = not tested) ? ?GAIT: ?Distance walked: 60 ?Assistive device utilized: None ?Level of assistance: Complete Independence ?Comments: Decreased stride length due to limited L hip extension  ? ? ? ?TODAY'S  TREATMENT: ?05/07/22: ?THERAPEUTIC EXERCISE: Instruction in initial HEP: ?- Hooklying Hamstring Stretch with Strap  - 2-3 x daily - 7 x weekly - 3 reps - 30 sec hold ?- Supine Quadriceps Stretch with Strap on Table  - 2-3 x daily - 7 x weekly - 3 reps - 30 sec hold ?- Half Kneeling Hip Flexor Stretch with Posterior Pelvic Tilt and Dowel  - 2-3 x daily - 7 x weekly - 3 reps - 30 sec hold ?- Seated Table Piriformis Stretch  - 2-3 x daily - 7 x weekly - 3 reps - 30 sec hold ?- Seated Hip Adductor Stretch  - 2-3 x daily - 7 x weekly - 3 reps - 30 sec hold ?- Standing ITB Stretch  - 2-3 x daily - 7 x weekly - 3 reps - 30 sec hold ?- Standard Lunge  - 1 x daily - 7 x weekly - 2 sets - 10 reps - 3 sec hold ? ? ?PATIENT EDUCATION:  ?Education details: PT eval findings, anticipated POC, and initial HEP ?Person educated: Patient ?Education method: Explanation, Demonstration, Verbal cues, and Handouts ?Education comprehension: verbalized understanding, returned demonstration, verbal cues required, and needs further education ? ? ?HOME EXERCISE PROGRAM: ?Access Code: [email or web address] ? ?ASSESSMENT: ? ?CLINICAL IMPRESSION: ?Alex West ("Alex West") is a 66 y.o. male who was seen today for physical therapy evaluation and treatment for L hip pain secondary to OA. Pain initially thought to be a groin strain triggered by a slip resulting in a split position on a slick bowling lane last fall but has lingered, fluctuating ever since. Imaging revealed degenerative changes in the L hip including severe OA. Deficits include L hip pain with gait resulting in decreased stride length, decreased L>R hip AROM, impaired L>R proximal LE flexibility and decreased B LE strength (R>L). Alex West will benefit from skilled PT to address above deficit to improve flexibility and overall LE strength to help decrease his hip pain and allow for normal mobility and gait. ? ?OBJECTIVE IMPAIRMENTS Abnormal gait, decreased activity tolerance, decreased knowledge of  condition, difficulty walking, decreased ROM, decreased strength, increased fascial restrictions, impaired perceived functional ability, increased muscle spasms, impaired flexibility, and pain.  ? ?ACTIVITY LIMITATIONS community activity, yard work, shopping, and working out .  ? ?PERSONAL FACTORS Past/current experiences, Time since onset of injury/illness/exacerbation, and 3+ comorbidities: OA, BPH, anemia, hypothyroidism, MVR & TVR, vertigo  are also affecting patient's functional outcome.  ? ? ?REHAB POTENTIAL: Good ? ?CLINICAL DECISION MAKING: Stable/uncomplicated ? ?EVALUATION COMPLEXITY: Low ? ? ?GOALS: ?Goals reviewed with patient? Yes ? ?SHORT TERM GOALS: Target date: 05/28/2022  ? ?Patient will be independent with initial HEP. ?Baseline: Initial HEP provided on eval ?Goal status: INITIAL ? ?LONG TERM GOALS: Target date: 06/18/2022  ? ?Patient will be independent with advanced/ongoing HEP to improve outcomes  and carryover.  ?Baseline: nitial HEP provided on eval ?Goal status: INITIAL ? ?2.  Patient will report at least 75% improvement in L hip pain to improve QOL. ?Baseline: L hip pain up to 10/10 at times with walking ?Goal status: INITIAL ? ?3.  Patient will demonstrate improved functional LE strength as demonstrated by increased B LE strength to >/= 4+/5. ?Baseline: Refer to above MMT chart ?Goal status: INITIAL ? ?4.  Patient will be able to ambulate 600' with LRAD and normal gait pattern including full stride length without increased pain to access community.  ?Baseline: Limited stride d/t L hip pain ?Goal status: INITIAL ? ?5.  Patient will report 76 on Hip FOTO to demonstrate improved functional ability. ?Baseline: 63 ?Goal status: INITIAL ? ?6.  Patient to report ability to perform ADLs, household, and leisure activities without limitation due to L hip pain, LOM or weakness. ?Baseline: Limited walking tolerance, has to alter gym workouts ?Goal status: INITIAL   ? ? ? ?PLAN: ?PT FREQUENCY: 2x/week ? ?PT  DURATION: 6 weeks ? ?PLANNED INTERVENTIONS: Therapeutic exercises, Therapeutic activity, Neuromuscular re-education, Balance training, Gait training, Patient/Family education, Joint mobilization, Dry Needling, Electri

## 2022-05-06 NOTE — Telephone Encounter (Signed)
Patient cld states he now wants to do the PT  suggested by Dr.Schmitz but they told him a new order has to be sent. ? ?--Forwarding message to med asst. ?-glh ?

## 2022-05-07 ENCOUNTER — Other Ambulatory Visit: Payer: Self-pay

## 2022-05-07 ENCOUNTER — Ambulatory Visit: Payer: BC Managed Care – PPO | Attending: Family Medicine | Admitting: Physical Therapy

## 2022-05-07 ENCOUNTER — Encounter: Payer: Self-pay | Admitting: Physical Therapy

## 2022-05-07 DIAGNOSIS — M6281 Muscle weakness (generalized): Secondary | ICD-10-CM | POA: Diagnosis not present

## 2022-05-07 DIAGNOSIS — R2689 Other abnormalities of gait and mobility: Secondary | ICD-10-CM | POA: Insufficient documentation

## 2022-05-07 DIAGNOSIS — M1612 Unilateral primary osteoarthritis, left hip: Secondary | ICD-10-CM | POA: Insufficient documentation

## 2022-05-07 DIAGNOSIS — M25552 Pain in left hip: Secondary | ICD-10-CM | POA: Diagnosis not present

## 2022-05-07 DIAGNOSIS — R262 Difficulty in walking, not elsewhere classified: Secondary | ICD-10-CM | POA: Diagnosis not present

## 2022-05-08 DIAGNOSIS — M9902 Segmental and somatic dysfunction of thoracic region: Secondary | ICD-10-CM | POA: Diagnosis not present

## 2022-05-08 DIAGNOSIS — M9903 Segmental and somatic dysfunction of lumbar region: Secondary | ICD-10-CM | POA: Diagnosis not present

## 2022-05-08 DIAGNOSIS — M9901 Segmental and somatic dysfunction of cervical region: Secondary | ICD-10-CM | POA: Diagnosis not present

## 2022-05-08 DIAGNOSIS — M9905 Segmental and somatic dysfunction of pelvic region: Secondary | ICD-10-CM | POA: Diagnosis not present

## 2022-05-13 DIAGNOSIS — M9903 Segmental and somatic dysfunction of lumbar region: Secondary | ICD-10-CM | POA: Diagnosis not present

## 2022-05-13 DIAGNOSIS — M9902 Segmental and somatic dysfunction of thoracic region: Secondary | ICD-10-CM | POA: Diagnosis not present

## 2022-05-13 DIAGNOSIS — M9901 Segmental and somatic dysfunction of cervical region: Secondary | ICD-10-CM | POA: Diagnosis not present

## 2022-05-13 DIAGNOSIS — M9905 Segmental and somatic dysfunction of pelvic region: Secondary | ICD-10-CM | POA: Diagnosis not present

## 2022-05-15 DIAGNOSIS — M9902 Segmental and somatic dysfunction of thoracic region: Secondary | ICD-10-CM | POA: Diagnosis not present

## 2022-05-15 DIAGNOSIS — M9901 Segmental and somatic dysfunction of cervical region: Secondary | ICD-10-CM | POA: Diagnosis not present

## 2022-05-15 DIAGNOSIS — M9905 Segmental and somatic dysfunction of pelvic region: Secondary | ICD-10-CM | POA: Diagnosis not present

## 2022-05-15 DIAGNOSIS — M9903 Segmental and somatic dysfunction of lumbar region: Secondary | ICD-10-CM | POA: Diagnosis not present

## 2022-05-15 NOTE — Therapy (Signed)
OUTPATIENT PHYSICAL THERAPY TREATMENT   Patient Name: Alex West MRN: [id number] DOB:06-Sep-1956, 66 y.o., male Today's Date: 05/16/2022   PT End of Session - 05/16/22 1702     Visit Number 2    Date for PT Re-Evaluation 06/18/22    Authorization Type BCBS - VL: 60 (PT/OT/Chiro)    PT Start Time 1702    PT Stop Time 3016    PT Time Calculation (min) 56 min    Activity Tolerance Patient tolerated treatment well    Behavior During Therapy WFL for tasks assessed/performed              Past Medical History:  Diagnosis Date   BPH (benign prostatic hyperplasia)    Colon polyp    ED (erectile dysfunction)    Elevated liver enzymes    Gallstones    Hepatitis    History of anemia    Hypercholesterolemia    Hypothyroidism    Mild tricuspid regurgitation    Mitral valve regurgitation    Thyroid disease    Vertigo    Past Surgical History:  Procedure Laterality Date   FLEXIBLE SIGMOIDOSCOPY     Patient Active Problem List   Diagnosis Date Noted   OA (osteoarthritis) of hip 07/24/2021   Immunosuppressed status ([place]) 09/05/2014   Hepatitis, autoimmune ([place]) 09/05/2014    PCP: Lawerance Cruel, MD  REFERRING PROVIDER: Rosemarie Ax, MD  REFERRING DIAG: [phone] (ICD-10-CM) - Primary osteoarthritis of left hip  THERAPY DIAG:  Pain in left hip  Difficulty in walking, not elsewhere classified  Other abnormalities of gait and mobility  Muscle weakness (generalized)  RATIONALE FOR EVALUATION AND TREATMENT:  Rehabilitation  ONSET DATE: Acute on chronic in nature.  SUBJECTIVE:   SUBJECTIVE STATEMENT: Pt reports he has been consistently performing the HEP stretches 1-2x/day but still feels limited with ability to walk with a full stride length on the L.  PERTINENT HISTORY: OA, BPH, anemia, hypothyroidism, MVR & TVR, vertigo  PAIN:  Are you having pain? Yes: NPRS scale: no pain at rest but with walking up to 10/10 Pain location: anterior L hip Pain  description: tearing sensation Aggravating factors: L hip extension while walking Relieving factors: shorter stride with walking - pain only with long stride on L  PRECAUTIONS: None  WEIGHT BEARING RESTRICTIONS No  FALLS:  Has patient West in last 6 months? No  LIVING ENVIRONMENT: Lives with: lives with their spouse Lives in: House/apartment Stairs: Yes: Internal: 14 steps; on right going up Has following equipment at home: None  OCCUPATION: Retired  PLOF: Independent and Leisure: working out at gym 5x/wk, runs a [employer] "To get my stride back."   OBJECTIVE:   DIAGNOSTIC FINDINGS:  12/29/21 L hip MRI: 1. Severe osteoarthritis of the left hip. Moderate left hip joint effusion.  2. Degeneration of the left labrum with maceration of the anterior labrum.  PATIENT SURVEYS:  FOTO Hip = 63, predicted = 73  COGNITION:  Overall cognitive status: Within functional limits for tasks assessed     SENSATION: WFL  MUSCLE LENGTH: Hamstrings: mild/mod tight L>R ITB: mod tight B Quads & hip flexors: mod tight L>R Piriformis: mod/severe tight L, mod tight R   LE ROM: B hip ROM mild to moderately limited in all planes with greatest limitation in L hip flexion and IR/ER  LE MMT:  MMT Right 05/07/2022 Left 05/07/2022  Hip flexion 5 5  Hip extension 4 4  Hip abduction 4- (pain) 4-  Hip  adduction 4 4+  Hip internal rotation 4+ 4+  Hip external rotation 4+ 4+  Knee flexion 4+ 5  Knee extension 5 5  Ankle dorsiflexion 5 5  Ankle plantarflexion    Ankle inversion    Ankle eversion     (Blank rows = not tested)  GAIT: Distance walked: 60 Assistive device utilized: None Level of assistance: Complete Independence Comments: Decreased stride length due to limited L hip extension     TODAY'S TREATMENT: 05/16/22 THERAPEUTIC EXERCISE: to improve strength and mobility.  Verbal and tactile cues throughout for technique. Rec bike L2 x 6 min Mod thomas hip  flexor stretch 2 x 60 sec following MT Bridge + green TB hip ABD isometric 10 x 5" Bridge + green TB clam 10 x 5" L sidelying green TB clam 10 x 5"  MANUAL THERAPY: to reduce muscle tension/TTP and improve flexibility STM/DTM to L hip flexors (proximal iliacus and distal iliopsoas), proximal hip adductors, and quads (primarily RF) Manual TPR to L hip flexors (proximal iliacus and distal iliopsoas) and quads (proximal/mid RF)  SELF CARE: Verbal instruction and demonstration of self-STM options for hip flexors   05/07/22 THERAPEUTIC EXERCISE: Instruction in initial HEP: - Hooklying Hamstring Stretch with Strap  - 2-3 x daily - 7 x weekly - 3 reps - 30 sec hold - Supine Quadriceps Stretch with Strap on Table  - 2-3 x daily - 7 x weekly - 3 reps - 30 sec hold - Half Kneeling Hip Flexor Stretch with Posterior Pelvic Tilt and Dowel  - 2-3 x daily - 7 x weekly - 3 reps - 30 sec hold - Seated Table Piriformis Stretch  - 2-3 x daily - 7 x weekly - 3 reps - 30 sec hold - Seated Hip Adductor Stretch  - 2-3 x daily - 7 x weekly - 3 reps - 30 sec hold - Standing ITB Stretch  - 2-3 x daily - 7 x weekly - 3 reps - 30 sec hold - Standard Lunge  - 1 x daily - 7 x weekly - 2 sets - 10 reps - 3 sec hold   PATIENT EDUCATION:  Education details: HEP update - hip strengthening and self-STM techniques for hip flexors using foam roller, rolling pin or tennis ball Person educated: Patient Education method: Consulting civil engineer, Demonstration, Verbal cues, and Handouts Education comprehension: verbalized understanding, returned demonstration, verbal cues required, and needs further education   HOME EXERCISE PROGRAM: Access Code: [email or web address]   Exercises - Hooklying Hamstring Stretch with Strap  - 2-3 x daily - 7 x weekly - 3 reps - 30 sec hold - Supine Quadriceps Stretch with Strap on Table  - 2-3 x daily - 7 x weekly - 3 reps - 30 sec hold - Half Kneeling Hip Flexor Stretch with Posterior Pelvic Tilt and Dowel  - 2-3  x daily - 7 x weekly - 3 reps - 30 sec hold - Seated Table Piriformis Stretch  - 2-3 x daily - 7 x weekly - 3 reps - 30 sec hold - Seated Hip Adductor Stretch  - 2-3 x daily - 7 x weekly - 3 reps - 30 sec hold - Standing ITB Stretch  - 2-3 x daily - 7 x weekly - 3 reps - 30 sec hold - Standard Lunge  - 1 x daily - 7 x weekly - 2 sets - 10 reps - 3 sec hold - Bridge with Hip Abduction and Resistance  - 1 x daily - 7 x weekly -  2 sets - 10 reps - 5 sec hold - Clamshell with Resistance  - 1 x daily - 7 x weekly - 2 sets - 10 reps - 3-5 sec hold - Hip Flexor Mobilization with Foam Roll  - 1 x daily - 1-2 min hold - Roller Massager Elongated Hip Flexor Release  - 1 x daily - 1-2 min hold - TFL Release With [employer] Against Wall  - 1 x daily - 1-2 min hold  Patient Education - Trigger Point Dry Needling  ASSESSMENT:  CLINICAL IMPRESSION: Alex "[name]" reports good compliance with home stretches but is discouraged as he has not yet noted any improvement in his ability to increase his L stride length with gait. He denies need for review of the HEP. Palpation revealing increased muscle tension, TPs and taut bands in his L hip flexors, adductors and proximal quads. Discussed possibility of DN to address abnormal tension, explaining mechanism of DN and expected response however pt reluctant to try this today, therefore focused on manual STM/DTM and TPR to L hip flexors (proximal iliacus and distal iliopsoas), proximal hip adductors, and quads (primarily RF) with palpable reduction in muscle tension and TTP allow for somewhat improved stride length with decreased pain following MT. MT followed by stretching and progression of proximal LE strengthening with HEP updated accordingly. Education also provided in self-STM options for use at home.  OBJECTIVE IMPAIRMENTS Abnormal gait, decreased activity tolerance, decreased knowledge of condition, difficulty walking, decreased ROM, decreased strength,  increased fascial restrictions, impaired perceived functional ability, increased muscle spasms, impaired flexibility, and pain.   ACTIVITY LIMITATIONS community activity, yard work, shopping, and working out .   PERSONAL FACTORS Past/current experiences, Time since onset of injury/illness/exacerbation, and 3+ comorbidities: OA, BPH, anemia, hypothyroidism, MVR & TVR, vertigo  are also affecting patient's functional outcome.    REHAB POTENTIAL: Good  CLINICAL DECISION MAKING: Stable/uncomplicated  EVALUATION COMPLEXITY: Low   GOALS: Goals reviewed with patient? Yes  SHORT TERM GOALS: Target date: 05/28/2022   Patient will be independent with initial HEP. Baseline: Initial HEP provided on eval Goal status: IN PROGRESS  LONG TERM GOALS: Target date: 06/18/2022   Patient will be independent with advanced/ongoing HEP to improve outcomes and carryover.  Baseline: initial HEP provided on eval Goal status: IN PROGRESS  2.  Patient will report at least 75% improvement in L hip pain to improve QOL. Baseline: L hip pain up to 10/10 at times with walking Goal status: IN PROGRESS  3.  Patient will demonstrate improved functional LE strength as demonstrated by increased B LE strength to >/= 4+/5. Baseline: Refer to above MMT chart Goal status: IN PROGRESS  4.  Patient will be able to ambulate 600' with LRAD and normal gait pattern including full stride length without increased pain to access community.  Baseline: Limited stride d/t L hip pain Goal status: IN PROGRESS  5.  Patient will report 7 on Hip FOTO to demonstrate improved functional ability. Baseline: 63 Goal status: IN PROGRESS  6.  Patient to report ability to perform ADLs, household, and leisure activities without limitation due to L hip pain, LOM or weakness. Baseline: Limited walking tolerance, has to alter gym workouts Goal status: IN PROGRESS      PLAN: PT FREQUENCY: 2x/week  PT DURATION: 6 weeks  PLANNED  INTERVENTIONS: Therapeutic exercises, Therapeutic activity, Neuromuscular re-education, Balance training, Gait training, Patient/Family education, Joint mobilization, Dry Needling, Electrical stimulation, Moist heat, Taping, Ultrasound, Ionotophoresis '4mg'$ /ml Dexamethasone, Manual therapy, and Re-evaluation  PLAN FOR NEXT SESSION: Review HEP PRN; progress hip flexibility and strengthening; MT +/- DN to normalize muscle tension; modalities PRN for pain management   Percival Spanish, PT 05/16/2022, 6:47 PM

## 2022-05-16 ENCOUNTER — Encounter: Payer: Self-pay | Admitting: Physical Therapy

## 2022-05-16 ENCOUNTER — Ambulatory Visit: Payer: BC Managed Care – PPO | Admitting: Physical Therapy

## 2022-05-16 DIAGNOSIS — M25552 Pain in left hip: Secondary | ICD-10-CM | POA: Diagnosis not present

## 2022-05-16 DIAGNOSIS — M6281 Muscle weakness (generalized): Secondary | ICD-10-CM

## 2022-05-16 DIAGNOSIS — R2689 Other abnormalities of gait and mobility: Secondary | ICD-10-CM | POA: Diagnosis not present

## 2022-05-16 DIAGNOSIS — M1612 Unilateral primary osteoarthritis, left hip: Secondary | ICD-10-CM | POA: Diagnosis not present

## 2022-05-16 DIAGNOSIS — R262 Difficulty in walking, not elsewhere classified: Secondary | ICD-10-CM | POA: Diagnosis not present

## 2022-05-16 DIAGNOSIS — R202 Paresthesia of skin: Secondary | ICD-10-CM | POA: Diagnosis not present

## 2022-05-16 DIAGNOSIS — Z6832 Body mass index (BMI) 32.0-32.9, adult: Secondary | ICD-10-CM | POA: Diagnosis not present

## 2022-05-22 ENCOUNTER — Ambulatory Visit: Payer: BC Managed Care – PPO

## 2022-05-22 DIAGNOSIS — M25552 Pain in left hip: Secondary | ICD-10-CM

## 2022-05-22 DIAGNOSIS — M6281 Muscle weakness (generalized): Secondary | ICD-10-CM

## 2022-05-22 DIAGNOSIS — R262 Difficulty in walking, not elsewhere classified: Secondary | ICD-10-CM

## 2022-05-22 DIAGNOSIS — R2689 Other abnormalities of gait and mobility: Secondary | ICD-10-CM

## 2022-05-22 NOTE — Therapy (Signed)
Pt arrived to session, refused to do any exercise or interventions today except dry needling. PT was unavailable to do dry needling today so deferred today's appointment as he declined to continue with participation on today's session.

## 2022-05-27 ENCOUNTER — Ambulatory Visit: Payer: BC Managed Care – PPO | Attending: Family Medicine | Admitting: Physical Therapy

## 2022-05-27 ENCOUNTER — Encounter: Payer: Self-pay | Admitting: Physical Therapy

## 2022-05-27 DIAGNOSIS — R2689 Other abnormalities of gait and mobility: Secondary | ICD-10-CM | POA: Insufficient documentation

## 2022-05-27 DIAGNOSIS — M6281 Muscle weakness (generalized): Secondary | ICD-10-CM | POA: Insufficient documentation

## 2022-05-27 DIAGNOSIS — M25552 Pain in left hip: Secondary | ICD-10-CM | POA: Insufficient documentation

## 2022-05-27 DIAGNOSIS — R262 Difficulty in walking, not elsewhere classified: Secondary | ICD-10-CM | POA: Diagnosis not present

## 2022-05-27 NOTE — Therapy (Signed)
OUTPATIENT PHYSICAL THERAPY TREATMENT   Patient Name: Danil Wedge MRN: [id number] DOB:1956-07-08, 66 y.o., male Today's Date: 05/27/2022   PT End of Session - 05/27/22 1018     Visit Number 3    Date for PT Re-Evaluation 06/18/22    Authorization Type BCBS - VL: 60 (PT/OT/Chiro)    PT Start Time 1018    PT Stop Time 1101    PT Time Calculation (min) 43 min    Activity Tolerance Patient tolerated treatment well    Behavior During Therapy WFL for tasks assessed/performed               Past Medical History:  Diagnosis Date   BPH (benign prostatic hyperplasia)    Colon polyp    ED (erectile dysfunction)    Elevated liver enzymes    Gallstones    Hepatitis    History of anemia    Hypercholesterolemia    Hypothyroidism    Mild tricuspid regurgitation    Mitral valve regurgitation    Thyroid disease    Vertigo    Past Surgical History:  Procedure Laterality Date   FLEXIBLE SIGMOIDOSCOPY     Patient Active Problem List   Diagnosis Date Noted   OA (osteoarthritis) of hip 07/24/2021   Immunosuppressed status ([place]) 09/05/2014   Hepatitis, autoimmune ([place]) 09/05/2014    PCP: Lawerance Cruel, MD  REFERRING PROVIDER: Rosemarie Ax, MD  REFERRING DIAG: [phone] (ICD-10-CM) - Primary osteoarthritis of left hip  THERAPY DIAG:  Pain in left hip  Difficulty in walking, not elsewhere classified  Other abnormalities of gait and mobility  Muscle weakness (generalized)  RATIONALE FOR EVALUATION AND TREATMENT:  Rehabilitation  ONSET DATE: Acute on chronic in nature.  SUBJECTIVE:   SUBJECTIVE STATEMENT: Pt requesting to proceed with the DN today. He denies pain currently but still feels the restriction in his stride length.  PERTINENT HISTORY: OA, BPH, anemia, hypothyroidism, MVR & TVR, vertigo  PAIN:  Are you having pain? No  PRECAUTIONS: None  WEIGHT BEARING RESTRICTIONS No  FALLS:  Has patient fallen in last 6 months? No  LIVING  ENVIRONMENT: Lives with: lives with their spouse Lives in: House/apartment Stairs: Yes: Internal: 14 steps; on right going up Has following equipment at home: None  OCCUPATION: Retired  PLOF: Independent and Leisure: working out at gym 5x/wk, runs a [employer] "To get my stride back."   OBJECTIVE:   DIAGNOSTIC FINDINGS:  12/29/21 L hip MRI: 1. Severe osteoarthritis of the left hip. Moderate left hip joint effusion.  2. Degeneration of the left labrum with maceration of the anterior labrum.  PATIENT SURVEYS:  FOTO Hip = 63, predicted = 73  COGNITION:  Overall cognitive status: Within functional limits for tasks assessed     SENSATION: WFL  MUSCLE LENGTH: Hamstrings: mild/mod tight L>R ITB: mod tight B Quads & hip flexors: mod tight L>R Piriformis: mod/severe tight L, mod tight R   LE ROM: B hip ROM mild to moderately limited in all planes with greatest limitation in L hip flexion and IR/ER  LE MMT:  MMT Right 05/07/2022 Left 05/07/2022  Hip flexion 5 5  Hip extension 4 4  Hip abduction 4- (pain) 4-  Hip adduction 4 4+  Hip internal rotation 4+ 4+  Hip external rotation 4+ 4+  Knee flexion 4+ 5  Knee extension 5 5  Ankle dorsiflexion 5 5  Ankle plantarflexion    Ankle inversion    Ankle eversion     (  Blank rows = not tested)  GAIT: Distance walked: 60 Assistive device utilized: None Level of assistance: Complete Independence Comments: Decreased stride length due to limited L hip extension     TODAY'S TREATMENT: 05/27/22 THERAPEUTIC EXERCISE: to improve strength and mobility.  Verbal and tactile cues throughout for technique. NuStep L5 x 7 min  MANUAL THERAPY: to reduce muscle tension/TTP and improve flexibility Trigger Point Dry-Needling  Treatment instructions: Expect mild to moderate muscle soreness. Patient verbalized understanding of these instructions and education.  Patient Consent Given: Yes Education handout provided: Previously  provided Muscles treated: L iliacus, TFL, gluteus maximus/medius/minimus, L proximal RF/VI Electrical stimulation performed: No Parameters: N/A Treatment response/outcome: Twitch Response Elicited and Palpable Increase in Muscle Length STM/DTM to L hip flexors (proximal iliacus and distal iliopsoas), TFL, glutes, and quads (primarily RF) Manual TPR to L hip flexors (proximal iliacus and distal iliopsoas), TFL, glutes and quads (proximal/mid RF)  SELF CARE: Reminder of self-STM options for hip flexors, glutes and quads  05/16/22 THERAPEUTIC EXERCISE: to improve strength and mobility.  Verbal and tactile cues throughout for technique. Rec bike L2 x 6 min Mod thomas hip flexor stretch 2 x 60 sec following MT Bridge + green TB hip ABD isometric 10 x 5" Bridge + green TB clam 10 x 5" L sidelying green TB clam 10 x 5"  MANUAL THERAPY: to reduce muscle tension/TTP and improve flexibility STM/DTM to L hip flexors (proximal iliacus and distal iliopsoas), proximal hip adductors, and quads (primarily RF) Manual TPR to L hip flexors (proximal iliacus and distal iliopsoas) and quads (proximal/mid RF)  SELF CARE: Verbal instruction and demonstration of self-STM options for hip flexors   05/07/22 THERAPEUTIC EXERCISE: Instruction in initial HEP: - Hooklying Hamstring Stretch with Strap  - 2-3 x daily - 7 x weekly - 3 reps - 30 sec hold - Supine Quadriceps Stretch with Strap on Table  - 2-3 x daily - 7 x weekly - 3 reps - 30 sec hold - Half Kneeling Hip Flexor Stretch with Posterior Pelvic Tilt and Dowel  - 2-3 x daily - 7 x weekly - 3 reps - 30 sec hold - Seated Table Piriformis Stretch  - 2-3 x daily - 7 x weekly - 3 reps - 30 sec hold - Seated Hip Adductor Stretch  - 2-3 x daily - 7 x weekly - 3 reps - 30 sec hold - Standing ITB Stretch  - 2-3 x daily - 7 x weekly - 3 reps - 30 sec hold - Standard Lunge  - 1 x daily - 7 x weekly - 2 sets - 10 reps - 3 sec hold   PATIENT EDUCATION:  Education  details: DN rational, procedure, outcomes, potential side effects, and recommended post-treatment exercises/activity   and self-STM techniques for hip flexors, glutes and quads using foam roller, rolling pin or tennis ball Person educated: Patient Education method: Customer service manager Education comprehension: verbalized understanding   HOME EXERCISE PROGRAM: Access Code: [email or web address]   Exercises - Hooklying Hamstring Stretch with Strap  - 2-3 x daily - 7 x weekly - 3 reps - 30 sec hold - Supine Quadriceps Stretch with Strap on Table  - 2-3 x daily - 7 x weekly - 3 reps - 30 sec hold - Half Kneeling Hip Flexor Stretch with Posterior Pelvic Tilt and Dowel  - 2-3 x daily - 7 x weekly - 3 reps - 30 sec hold - Seated Table Piriformis Stretch  - 2-3 x daily - 7 x  weekly - 3 reps - 30 sec hold - Seated Hip Adductor Stretch  - 2-3 x daily - 7 x weekly - 3 reps - 30 sec hold - Standing ITB Stretch  - 2-3 x daily - 7 x weekly - 3 reps - 30 sec hold - Standard Lunge  - 1 x daily - 7 x weekly - 2 sets - 10 reps - 3 sec hold - Bridge with Hip Abduction and Resistance  - 1 x daily - 7 x weekly - 2 sets - 10 reps - 5 sec hold - Clamshell with Resistance  - 1 x daily - 7 x weekly - 2 sets - 10 reps - 3-5 sec hold - Hip Flexor Mobilization with Foam Roll  - 1 x daily - 1-2 min hold - Roller Massager Elongated Hip Flexor Release  - 1 x daily - 1-2 min hold - TFL Release With [employer] Against Wall  - 1 x daily - 1-2 min hold  Patient Education - Trigger Point Dry Needling  ASSESSMENT:  CLINICAL IMPRESSION: Cain "[name]" noted benefit in his flexibility and ability to increase his stride length following MT last visit and requested to proceed with DN as previously discussed. After review of explanation of DN rational, procedures, outcomes and potential side effects, patient verbalized consent to DN treatment in conjunction with manual STM/DTM and TPR to reduce ttp/muscle tension. Muscles  treated include L iliacus, TFL, gluteus maximus/medius/minimus, and L proximal RF/VI. DN produced normal response with good twitches elicited resulting in palpable reduction in pain/ttp and muscle tension with pt noting ability to take a longer stride w/o increased pain following DN. Pt educated to expect mild to moderate muscle soreness for up to 24-48 hrs and instructed to continue prescribed home exercise program and current activity level with pt verbalizing understanding of theses instructions.   OBJECTIVE IMPAIRMENTS Abnormal gait, decreased activity tolerance, decreased knowledge of condition, difficulty walking, decreased ROM, decreased strength, increased fascial restrictions, impaired perceived functional ability, increased muscle spasms, impaired flexibility, and pain.   ACTIVITY LIMITATIONS community activity, yard work, shopping, and working out .   PERSONAL FACTORS Past/current experiences, Time since onset of injury/illness/exacerbation, and 3+ comorbidities: OA, BPH, anemia, hypothyroidism, MVR & TVR, vertigo  are also affecting patient's functional outcome.    REHAB POTENTIAL: Good  CLINICAL DECISION MAKING: Stable/uncomplicated  EVALUATION COMPLEXITY: Low   GOALS: Goals reviewed with patient? Yes  SHORT TERM GOALS: Target date: 05/28/2022   Patient will be independent with initial HEP. Baseline: Initial HEP provided on eval Goal status: IN PROGRESS  LONG TERM GOALS: Target date: 06/18/2022   Patient will be independent with advanced/ongoing HEP to improve outcomes and carryover.  Baseline: initial HEP provided on eval Goal status: IN PROGRESS  2.  Patient will report at least 75% improvement in L hip pain to improve QOL. Baseline: L hip pain up to 10/10 at times with walking Goal status: IN PROGRESS  3.  Patient will demonstrate improved functional LE strength as demonstrated by increased B LE strength to >/= 4+/5. Baseline: Refer to above MMT chart Goal status: IN  PROGRESS  4.  Patient will be able to ambulate 600' with LRAD and normal gait pattern including full stride length without increased pain to access community.  Baseline: Limited stride d/t L hip pain Goal status: IN PROGRESS  5.  Patient will report 29 on Hip FOTO to demonstrate improved functional ability. Baseline: 63 Goal status: IN PROGRESS  6.  Patient to report  ability to perform ADLs, household, and leisure activities without limitation due to L hip pain, LOM or weakness. Baseline: Limited walking tolerance, has to alter gym workouts Goal status: IN PROGRESS      PLAN: PT FREQUENCY: 2x/week  PT DURATION: 6 weeks  PLANNED INTERVENTIONS: Therapeutic exercises, Therapeutic activity, Neuromuscular re-education, Balance training, Gait training, Patient/Family education, Joint mobilization, Dry Needling, Electrical stimulation, Moist heat, Taping, Ultrasound, Ionotophoresis '4mg'$ /ml Dexamethasone, Manual therapy, and Re-evaluation  PLAN FOR NEXT SESSION: Assess response to DN; progress hip flexibility and strengthening - review & update HEP PRN; MT +/- DN to normalize muscle tension; modalities PRN for pain management   Percival Spanish, PT 05/27/2022, 6:32 PM

## 2022-05-29 ENCOUNTER — Ambulatory Visit: Payer: BC Managed Care – PPO | Admitting: Physical Therapy

## 2022-06-04 ENCOUNTER — Ambulatory Visit: Payer: BC Managed Care – PPO | Admitting: Physical Therapy

## 2022-06-04 ENCOUNTER — Encounter: Payer: Self-pay | Admitting: Physical Therapy

## 2022-06-04 DIAGNOSIS — M6281 Muscle weakness (generalized): Secondary | ICD-10-CM | POA: Diagnosis not present

## 2022-06-04 DIAGNOSIS — R2689 Other abnormalities of gait and mobility: Secondary | ICD-10-CM | POA: Diagnosis not present

## 2022-06-04 DIAGNOSIS — M25552 Pain in left hip: Secondary | ICD-10-CM | POA: Diagnosis not present

## 2022-06-04 DIAGNOSIS — R262 Difficulty in walking, not elsewhere classified: Secondary | ICD-10-CM

## 2022-06-04 NOTE — Therapy (Signed)
OUTPATIENT PHYSICAL THERAPY TREATMENT   Patient Name: Alex West MRN: [id number] DOB:10-Jun-1956, 66 y.o., male Today's Date: 06/04/2022   PT End of Session - 06/04/22 0939     Visit Number 4    Date for PT Re-Evaluation 06/18/22    Authorization Type BCBS - VL: 34 (PT/OT/Chiro)    PT Start Time [phone]   Pt arrived but not checked in   PT Stop Time 1017    PT Time Calculation (min) 38 min    Activity Tolerance Patient tolerated treatment well    Behavior During Therapy [facility] for tasks assessed/performed                Past Medical History:  Diagnosis Date   BPH (benign prostatic hyperplasia)    Colon polyp    ED (erectile dysfunction)    Elevated liver enzymes    Gallstones    Hepatitis    History of anemia    Hypercholesterolemia    Hypothyroidism    Mild tricuspid regurgitation    Mitral valve regurgitation    Thyroid disease    Vertigo    Past Surgical History:  Procedure Laterality Date   FLEXIBLE SIGMOIDOSCOPY     Patient Active Problem List   Diagnosis Date Noted   OA (osteoarthritis) of hip 07/24/2021   Immunosuppressed status ([place]) 09/05/2014   Hepatitis, autoimmune ([place]) 09/05/2014    PCP: Lawerance Cruel, MD  REFERRING PROVIDER: Rosemarie Ax, MD  REFERRING DIAG: [phone] (ICD-10-CM) - Primary osteoarthritis of left hip  THERAPY DIAG:  Pain in left hip  Difficulty in walking, not elsewhere classified  Other abnormalities of gait and mobility  Muscle weakness (generalized)  RATIONALE FOR EVALUATION AND TREATMENT:  Rehabilitation  ONSET DATE: Acute on chronic in nature.  SUBJECTIVE:   SUBJECTIVE STATEMENT: Pt reports ability to take a longer stride following DN last visit. Yesterday he was having issues with feeling of L LE bucking at the hip joint but this morning seems fine. He did note increased activity recently working on renovations for property he owns and may have overdone things. Took an Aleve last night which may have  helped.  PAIN:  Are you having pain? No  PERTINENT HISTORY: OA, BPH, anemia, hypothyroidism, MVR & TVR, vertigo   PRECAUTIONS: None  WEIGHT BEARING RESTRICTIONS No  FALLS:  Has patient fallen in last 6 months? No  LIVING ENVIRONMENT: Lives with: lives with their spouse Lives in: House/apartment Stairs: Yes: Internal: 14 steps; on right going up Has following equipment at home: None  OCCUPATION: Retired  PLOF: Independent and Leisure: working out at gym 5x/wk, runs a [place] "To get my stride back."   OBJECTIVE:   DIAGNOSTIC FINDINGS:  12/29/21 L hip MRI: 1. Severe osteoarthritis of the left hip. Moderate left hip joint effusion.  2. Degeneration of the left labrum with maceration of the anterior labrum.  PATIENT SURVEYS:  FOTO Hip = 63, predicted = 73  COGNITION:  Overall cognitive status: Within functional limits for tasks assessed     SENSATION: WFL  MUSCLE LENGTH: Hamstrings: mild/mod tight L>R ITB: mod tight B Quads & hip flexors: mod tight L>R Piriformis: mod/severe tight L, mod tight R   LE ROM: B hip ROM mild to moderately limited in all planes with greatest limitation in L hip flexion and IR/ER  LE MMT:  MMT Right 05/07/2022 Left 05/07/2022  Hip flexion 5 5  Hip extension 4 4  Hip abduction 4- (pain) 4-  Hip  adduction 4 4+  Hip internal rotation 4+ 4+  Hip external rotation 4+ 4+  Knee flexion 4+ 5  Knee extension 5 5  Ankle dorsiflexion 5 5  Ankle plantarflexion    Ankle inversion    Ankle eversion     (Blank rows = not tested)  GAIT: Distance walked: 60 Assistive device utilized: None Level of assistance: Complete Independence Comments: Decreased stride length due to limited L hip extension     TODAY'S TREATMENT:  06/04/22 THERAPEUTIC EXERCISE: to improve strength and mobility.  Verbal and tactile cues throughout for technique. NuStep L6 x 6 min Standing GTB L/R 4-way hip SLR x 10 reps each, 2 pole A for  balance BATCA knee flexion 35# B LE x 10; 35# B con/L ecc x 10 (cues to slow pace avoiding jerking bar back and for slow eccentric release) Counter squat 2 x 10 Heel raises + quad & glute sets 2 x 10  05/27/22 THERAPEUTIC EXERCISE: to improve strength and mobility.  Verbal and tactile cues throughout for technique. NuStep L5 x 7 min  MANUAL THERAPY: to reduce muscle tension/TTP and improve flexibility Trigger Point Dry-Needling  Treatment instructions: Expect mild to moderate muscle soreness. Patient verbalized understanding of these instructions and education.  Patient Consent Given: Yes Education handout provided: Previously provided Muscles treated: L iliacus, TFL, gluteus maximus/medius/minimus, L proximal RF/VI Electrical stimulation performed: No Parameters: N/A Treatment response/outcome: Twitch Response Elicited and Palpable Increase in Muscle Length STM/DTM to L hip flexors (proximal iliacus and distal iliopsoas), TFL, glutes, and quads (primarily RF) Manual TPR to L hip flexors (proximal iliacus and distal iliopsoas), TFL, glutes and quads (proximal/mid RF)  SELF CARE: Reminder of self-STM options for hip flexors, glutes and quads  05/16/22 THERAPEUTIC EXERCISE: to improve strength and mobility.  Verbal and tactile cues throughout for technique. Rec bike L2 x 6 min Mod thomas hip flexor stretch 2 x 60 sec following MT Bridge + green TB hip ABD isometric 10 x 5" Bridge + green TB clam 10 x 5" L sidelying green TB clam 10 x 5"  MANUAL THERAPY: to reduce muscle tension/TTP and improve flexibility STM/DTM to L hip flexors (proximal iliacus and distal iliopsoas), proximal hip adductors, and quads (primarily RF) Manual TPR to L hip flexors (proximal iliacus and distal iliopsoas) and quads (proximal/mid RF)  SELF CARE: Verbal instruction and demonstration of self-STM options for hip flexors   PATIENT EDUCATION:  Education details: DN rational, procedure, outcomes, potential  side effects, and recommended post-treatment exercises/activity   and self-STM techniques for hip flexors, glutes and quads using foam roller, rolling pin or tennis ball Person educated: Patient Education method: Customer service manager Education comprehension: verbalized understanding   HOME EXERCISE PROGRAM: Access Code: [email or web address]   Exercises - Hooklying Hamstring Stretch with Strap  - 2-3 x daily - 7 x weekly - 3 reps - 30 sec hold - Supine Quadriceps Stretch with Strap on Table  - 2-3 x daily - 7 x weekly - 3 reps - 30 sec hold - Half Kneeling Hip Flexor Stretch with Posterior Pelvic Tilt and Dowel  - 2-3 x daily - 7 x weekly - 3 reps - 30 sec hold - Seated Table Piriformis Stretch  - 2-3 x daily - 7 x weekly - 3 reps - 30 sec hold - Seated Hip Adductor Stretch  - 2-3 x daily - 7 x weekly - 3 reps - 30 sec hold - Standing ITB Stretch  - 2-3 x daily - 7  x weekly - 3 reps - 30 sec hold - Standard Lunge  - 1 x daily - 7 x weekly - 2 sets - 10 reps - 3 sec hold - Bridge with Hip Abduction and Resistance  - 1 x daily - 7 x weekly - 2 sets - 10 reps - 5 sec hold - Clamshell with Resistance  - 1 x daily - 7 x weekly - 2 sets - 10 reps - 3-5 sec hold - Hip Flexor Mobilization with Foam Roll  - 1 x daily - 1-2 min hold - Roller Massager Elongated Hip Flexor Release  - 1 x daily - 1-2 min hold - TFL Release With [employer] Against Wall  - 1 x daily - 1-2 min hold  Patient Education - Trigger Point Dry Needling  ASSESSMENT:  CLINICAL IMPRESSION: Ebb "[name]" noted benefit from DN last visit with improved ability to increase his stride length on the L w/o pain. He admits to limited compliance with the HEP as he has been busy but denies any concerns or issues - STG met. He denies need for MT or DN today, requesting to focus on strengthening progression. Progressed closed chain and machine strengthening to target proximal LE weakness. Introduced standing 4-way hip SLR with GTB to  target hip strengthening as well as SLS stability. All exercises well tolerated but pt recognizing where he has gotten weaker over the past year.  OBJECTIVE IMPAIRMENTS Abnormal gait, decreased activity tolerance, decreased knowledge of condition, difficulty walking, decreased ROM, decreased strength, increased fascial restrictions, impaired perceived functional ability, increased muscle spasms, impaired flexibility, and pain.   ACTIVITY LIMITATIONS community activity, yard work, shopping, and working out .   PERSONAL FACTORS Past/current experiences, Time since onset of injury/illness/exacerbation, and 3+ comorbidities: OA, BPH, anemia, hypothyroidism, MVR & TVR, vertigo  are also affecting patient's functional outcome.    REHAB POTENTIAL: Good  CLINICAL DECISION MAKING: Stable/uncomplicated  EVALUATION COMPLEXITY: Low   GOALS: Goals reviewed with patient? Yes  SHORT TERM GOALS: Target date: 05/28/2022   Patient will be independent with initial HEP. Baseline: Initial HEP provided on eval Goal status: MET  (06/04/22)  LONG TERM GOALS: Target date: 06/18/2022   Patient will be independent with advanced/ongoing HEP to improve outcomes and carryover.  Baseline: initial HEP provided on eval Goal status: IN PROGRESS  2.  Patient will report at least 75% improvement in L hip pain to improve QOL. Baseline: L hip pain up to 10/10 at times with walking Goal status: IN PROGRESS  3.  Patient will demonstrate improved functional LE strength as demonstrated by increased B LE strength to >/= 4+/5. Baseline: Refer to above MMT chart Goal status: IN PROGRESS  4.  Patient will be able to ambulate 600' with LRAD and normal gait pattern including full stride length without increased pain to access community.  Baseline: Limited stride d/t L hip pain Goal status: IN PROGRESS  5.  Patient will report 63 on Hip FOTO to demonstrate improved functional ability. Baseline: 63 Goal status: IN  PROGRESS  6.  Patient to report ability to perform ADLs, household, and leisure activities without limitation due to L hip pain, LOM or weakness. Baseline: Limited walking tolerance, has to alter gym workouts Goal status: IN PROGRESS      PLAN: PT FREQUENCY: 2x/week  PT DURATION: 6 weeks  PLANNED INTERVENTIONS: Therapeutic exercises, Therapeutic activity, Neuromuscular re-education, Balance training, Gait training, Patient/Family education, Joint mobilization, Dry Needling, Electrical stimulation, Moist heat, Taping, Ultrasound, Ionotophoresis 51m/ml Dexamethasone,  Manual therapy, and Re-evaluation  PLAN FOR NEXT SESSION:  progress hip flexibility and strengthening - review & update HEP PRN; MT +/- DN to normalize muscle tension; modalities PRN for pain management   Percival Spanish, PT 06/04/2022, 12:20 PM

## 2022-06-06 ENCOUNTER — Ambulatory Visit: Payer: BC Managed Care – PPO | Admitting: Neurology

## 2022-06-06 ENCOUNTER — Encounter: Payer: Self-pay | Admitting: Neurology

## 2022-06-06 VITALS — BP 137/84 | HR 88 | Ht 72.0 in | Wt 246.0 lb

## 2022-06-06 DIAGNOSIS — M542 Cervicalgia: Secondary | ICD-10-CM | POA: Diagnosis not present

## 2022-06-06 DIAGNOSIS — R531 Weakness: Secondary | ICD-10-CM | POA: Insufficient documentation

## 2022-06-06 DIAGNOSIS — R5381 Other malaise: Secondary | ICD-10-CM | POA: Insufficient documentation

## 2022-06-06 NOTE — Progress Notes (Signed)
Chief Complaint  Patient presents with   New Patient (Initial Visit)    Room 13, alone NP/Urgent Paper/[name] FNP [place] [facility] [phone]/Numbness tingling and burning in bilateral arms and hands        ASSESSMENT AND PLAN  Alex West is a 66 y.o. male  Known history of cervical degenerative disease, worsening neck pain, bilateral upper extremity paresthesia, weakness right worse than left  On examination, bilateral proximal upper extremity weakness, right worse than left, absent right brachioradialis, brisk bilateral patellar reflex, Babinski signs,  Suggestive of cervical myelopathy with bilateral radiculopathy  MRI of cervical spine  Get lab result from primary care physician   DIAGNOSTIC DATA (LABS, IMAGING, TESTING) - I reviewed patient records, labs, notes, testing and imaging myself where available.   MEDICAL HISTORY:  Alex West is a 66 year old male, seen in request by his primary care doctor Lawerance Cruel, for evaluation of bilateral arm numbness, weakness right worse than left  I reviewed and summarized the referring note.PMHX Hypothyroidism Hyperlipidemia Mitral valve regurgitation by echo Elevated liver enzymes in December 2013, ultrasound showed fatty infiltration of liver, with positive ANA, positive actin and antimitochondrial chondral antibody, negative hepatitis ABC, normal ferritin, was seen by [place] hepatologist, liver biopsy in March 2014, chronic inflammation consistent with autoimmune hepatitis, good response to budesonide, transition to Imuran September 2015, he is currently taking Imuran 50 mg 1 and half tablets daily  Since the beginning of 2023, he noticed bilateral arm numbness, initially had neck pain radiating pain to left shoulder, now achiness sensation, bilateral shoulder, upper extremity, persistent numbness of right thumb, left fourth and fifth fingers,  He also noticed the weakness, he used to workout heavy  lifting, now is really limited, it is even difficult for him to open jars,  He denies bowel and bladder incontinence, he noticed gait abnormality, but contributed to his severe left hip pain, MRI of left hip in January 23 showed severe osteoarthritis of left hip, with moderate effusion, degeneration of lateral labrum with laceration of the anterior labrum  PHYSICAL EXAM:   Vitals:   06/06/22 0732  BP: 137/84  Pulse: 88  Weight: 246 lb (111.6 kg)  Height: 6' (1.829 m)   Not recorded     Body mass index is 33.36 kg/m.  PHYSICAL EXAMNIATION:  Gen: NAD, conversant, well nourised, well groomed                     Cardiovascular: Regular rate rhythm, no peripheral edema, warm, nontender. Eyes: Conjunctivae clear without exudates or hemorrhage Neck: Supple, no carotid bruits. Pulmonary: Clear to auscultation bilaterally   NEUROLOGICAL EXAM:  MENTAL STATUS: Speech/cognition: Awake, alert, oriented to history taking and casual conversation CRANIAL NERVES: CN II: Visual fields are full to confrontation. Pupils are round equal and briskly reactive to light. CN III, IV, VI: extraocular movement are normal. No ptosis. CN V: Facial sensation is intact to light touch CN VII: Face is symmetric with normal eye closure  CN VIII: Hearing is normal to causal conversation. CN IX, X: Phonation is normal. CN XI: Head turning and shoulder shrug are intact  MOTOR:  UE Shoulder Abduction Shoulder External Rotation Elbow Flexion Elbow  Extension Wrist Flexion Wrist Extension Grip Finger  Abduction  R '4 4 4 '$ 5- '5 5 5 '$ 5-  L 4+ $+ 5- '5 5 5 5 '$ 5-   LE Hip Flexion Knee flexion Knee extension Ankle Dorsiflexion Eversion Ankle plantar Flexion Inversion  R  $'5 5 5 5 5 5 5  'U$ L '5 5 5 5 5 5 5    '$ Limited range of motion of left hip due to pain  REFLEXES: Reflexes (R/L) brachioradialis 0/2  biceps 1/2, triceps 2/2, knees 3/3 , and ankles 2/2 . Plantar responses are extensor  bilaterally  SENSORY: Intact to light touch, pinprick and vibratory sensation are intact in fingers and toes.  COORDINATION: There is no trunk or limb dysmetria noted.  GAIT/STANCE:  push-up to get up from seated position, mildly antalgic, could perform tandem walking, could stand up on heel and tiptoe,  REVIEW OF SYSTEMS:  Full 14 system review of systems performed and notable only for as above All other review of systems were negative.   ALLERGIES: No Known Allergies  HOME MEDICATIONS: Current Outpatient Medications  Medication Sig Dispense Refill   azaTHIOprine (IMURAN) 50 MG tablet Take by mouth.     levothyroxine (SYNTHROID, LEVOTHROID) 150 MCG tablet Take 150 mcg by mouth daily.     sildenafil (VIAGRA) 100 MG tablet Take 100 mg by mouth as needed for erectile dysfunction.     Current Facility-Administered Medications  Medication Dose Route Frequency Provider Last Rate Last Admin   acetaminophen (TYLENOL) tablet 975 mg  975 mg Oral Q4H PRN Copland, Gay Filler, MD        PAST MEDICAL HISTORY: Past Medical History:  Diagnosis Date   BPH (benign prostatic hyperplasia)    Colon polyp    ED (erectile dysfunction)    Elevated liver enzymes    Gallstones    Hepatitis    History of anemia    Hypercholesterolemia    Hypothyroidism    Mild tricuspid regurgitation    Mitral valve regurgitation    Thyroid disease    Vertigo     PAST SURGICAL HISTORY: Past Surgical History:  Procedure Laterality Date   FLEXIBLE SIGMOIDOSCOPY      FAMILY HISTORY: Family History  Problem Relation Age of Onset   Cancer Mother        pancreatic   Cancer Father        stomach   Kidney failure Brother    Other Brother        neck tumor   Hypertension Brother    Diabetes Brother    Lupus Daughter    Hypertension Sister    Other Son        boating accident    SOCIAL HISTORY: Social History   Socioeconomic History   Marital status: Married    Spouse name: Not on file    Number of children: Not on file   Years of education: Not on file   Highest education level: Not on file  Occupational History   Occupation: Sales  Tobacco Use   Smoking status: Former   Smokeless tobacco: Never  Substance and Sexual Activity   Alcohol use: No   Drug use: No   Sexual activity: Not on file  Other Topics Concern   Not on file  Social History Narrative   Not on file   Social Determinants of Health   Financial Resource Strain: Not on file  Food Insecurity: Not on file  Transportation Needs: Not on file  Physical Activity: Not on file  Stress: Not on file  Social Connections: Not on file  Intimate Partner Violence: Not on file      Marcial Pacas, M.D. Ph.D.  [facility] [address] [place], [place] [address] Ph: [phone] Fax: [phone]  CC:  Kristen Loader, [place],  [place] [address]  Lawerance Cruel, MD

## 2022-06-07 ENCOUNTER — Ambulatory Visit: Payer: BC Managed Care – PPO | Admitting: Physical Therapy

## 2022-06-07 ENCOUNTER — Telehealth: Payer: Self-pay | Admitting: Neurology

## 2022-06-07 ENCOUNTER — Encounter: Payer: Self-pay | Admitting: Physical Therapy

## 2022-06-07 DIAGNOSIS — M6281 Muscle weakness (generalized): Secondary | ICD-10-CM | POA: Diagnosis not present

## 2022-06-07 DIAGNOSIS — R2689 Other abnormalities of gait and mobility: Secondary | ICD-10-CM

## 2022-06-07 DIAGNOSIS — R262 Difficulty in walking, not elsewhere classified: Secondary | ICD-10-CM

## 2022-06-07 DIAGNOSIS — M25552 Pain in left hip: Secondary | ICD-10-CM

## 2022-06-07 NOTE — Therapy (Signed)
OUTPATIENT PHYSICAL THERAPY TREATMENT   Patient Name: Alex West MRN: [id number] DOB:11-25-1956, 66 y.o., male Today's Date: 06/07/2022   PT End of Session - 06/07/22 0855     Visit Number 5    Date for PT Re-Evaluation 06/18/22    Authorization Type BCBS - VL: 61 (PT/OT/Chiro)    PT Start Time 0855   Pt arrived late   PT Stop Time 0933    PT Time Calculation (min) 38 min    Activity Tolerance Patient tolerated treatment well    Behavior During Therapy [facility] for tasks assessed/performed                 Past Medical History:  Diagnosis Date   BPH (benign prostatic hyperplasia)    Colon polyp    ED (erectile dysfunction)    Elevated liver enzymes    Gallstones    Hepatitis    History of anemia    Hypercholesterolemia    Hypothyroidism    Mild tricuspid regurgitation    Mitral valve regurgitation    Thyroid disease    Vertigo    Past Surgical History:  Procedure Laterality Date   FLEXIBLE SIGMOIDOSCOPY     Patient Active Problem List   Diagnosis Date Noted   Weakness 06/06/2022   Neck pain 06/06/2022   OA (osteoarthritis) of hip 07/24/2021   Immunosuppressed status ([place]) 09/05/2014   Hepatitis, autoimmune ([place]) 09/05/2014    PCP: Lawerance Cruel, MD  REFERRING PROVIDER: Rosemarie Ax, MD  REFERRING DIAG: [phone] (ICD-10-CM) - Primary osteoarthritis of left hip  THERAPY DIAG:  Pain in left hip  Difficulty in walking, not elsewhere classified  Other abnormalities of gait and mobility  Muscle weakness (generalized)  RATIONALE FOR EVALUATION AND TREATMENT:  Rehabilitation  ONSET DATE: Acute on chronic in nature.  SUBJECTIVE:   SUBJECTIVE STATEMENT: Pt reports rarely does he hurt, only noting limited ROM and discomfort when he tries to take too long of a stride. He would like to check to see if further DN indicated today.  PAIN:  Are you having pain? No  PERTINENT HISTORY: OA, BPH, anemia, hypothyroidism, MVR & TVR, vertigo    PRECAUTIONS: None  WEIGHT BEARING RESTRICTIONS No  FALLS:  Has patient fallen in last 6 months? No  LIVING ENVIRONMENT: Lives with: lives with their spouse Lives in: House/apartment Stairs: Yes: Internal: 14 steps; on right going up Has following equipment at home: None  OCCUPATION: Retired  PLOF: Independent and Leisure: working out at gym 5x/wk, runs a [place] "To get my stride back."   OBJECTIVE:   DIAGNOSTIC FINDINGS:  12/29/21 L hip MRI: 1. Severe osteoarthritis of the left hip. Moderate left hip joint effusion.  2. Degeneration of the left labrum with maceration of the anterior labrum.  PATIENT SURVEYS:  FOTO Hip = 63, predicted = 73  COGNITION:  Overall cognitive status: Within functional limits for tasks assessed     SENSATION: WFL  MUSCLE LENGTH: Hamstrings: mild/mod tight L>R ITB: mod tight B Quads & hip flexors: mod tight L>R Piriformis: mod/severe tight L, mod tight R   LE ROM: B hip ROM mild to moderately limited in all planes with greatest limitation in L hip flexion and IR/ER  LE MMT:  MMT Right 05/07/22 Left 05/07/22 Right 06/07/22 Left 06/07/22  Hip flexion $RemoveBef'5 5 5 5  'JzqDwQwnCt$ Hip extension $RemoveBefor'4 4 4 'RtqEDFqrjBwR$ 4+  Hip abduction 4-  4- (pain) 4 4- (pain)  Hip adduction 4 4+ 4+ 4+  Hip internal rotation 4+ 4+ 5 5  Hip external rotation 4+ 4+ 4+ 5  Knee flexion 4+ 5 5 5   Knee extension 5 5 5 5   Ankle dorsiflexion 5 5 5 5   Ankle plantarflexion      Ankle inversion      Ankle eversion       (Blank rows = not tested)  GAIT: Distance walked: 60 Assistive device utilized: None Level of assistance: Complete Independence Comments: Decreased stride length due to limited L hip extension     TODAY'S TREATMENT:  06/07/22 THERAPEUTIC EXERCISE: to improve flexibility, strength and mobility.  Verbal and tactile cues throughout for technique. Rec bike L4 x 6 min  MANUAL THERAPY: to reduce muscle tension/TTP and improve flexibility Trigger Point  Dry-Needling  Treatment instructions: Expect mild to moderate muscle soreness. Patient verbalized understanding of these instructions and education.  Patient Consent Given: Yes Education handout provided: Previously provided Muscles treated: L iliacus, TFL, L proximal quads RF/VI/VL Electrical stimulation performed: No Parameters: N/A Treatment response/outcome: Twitch Response Elicited and Palpable Increase in Muscle Length STM/DTM to L hip flexors (proximal iliacus and distal iliopsoas), TFL, glutes, and proximal quads (primarily RF) Manual TPR to L hip flexors (proximal iliacus and distal iliopsoas),   SELF CARE: Discussed sleeping positions and recommended trying a pillow between his knees/legs to promote more neutral hip and spine alignment  06/04/22 THERAPEUTIC EXERCISE: to improve strength and mobility.  Verbal and tactile cues throughout for technique. NuStep L6 x 6 min Standing GTB L/R 4-way hip SLR x 10 reps each, 2 pole A for balance BATCA knee flexion 35# B LE x 10; 35# B con/L ecc x 10 (cues to slow pace avoiding jerking bar back and for slow eccentric release) Counter squat 2 x 10 Heel raises + quad & glute sets 2 x 10   05/27/22 THERAPEUTIC EXERCISE: to improve strength and mobility.  Verbal and tactile cues throughout for technique. NuStep L5 x 7 min  MANUAL THERAPY: to reduce muscle tension/TTP and improve flexibility Trigger Point Dry-Needling  Treatment instructions: Expect mild to moderate muscle soreness. Patient verbalized understanding of these instructions and education.  Patient Consent Given: Yes Education handout provided: Previously provided Muscles treated: L iliacus, TFL, gluteus maximus/medius/minimus, L proximal RF/VI Electrical stimulation performed: No Parameters: N/A Treatment response/outcome: Twitch Response Elicited and Palpable Increase in Muscle Length STM/DTM to L hip flexors (proximal iliacus and distal iliopsoas), TFL, glutes, and quads  (primarily RF) Manual TPR to L hip flexors (proximal iliacus and distal iliopsoas), TFL, glutes and quads (proximal/mid RF)  SELF CARE: Reminder of self-STM options for hip flexors, glutes and quads   PATIENT EDUCATION:  Education details: DN rational, procedure, outcomes, potential side effects, and recommended post-treatment exercises/activity   and  recommended sleeping positions using pillow btw knees/legs to promote neutral hip/spine alignment Person educated: Patient Education method: Customer service manager Education comprehension: verbalized understanding   HOME EXERCISE PROGRAM: Access Code: [email or web address]   Exercises - Hooklying Hamstring Stretch with Strap  - 2-3 x daily - 7 x weekly - 3 reps - 30 sec hold - Supine Quadriceps Stretch with Strap on Table  - 2-3 x daily - 7 x weekly - 3 reps - 30 sec hold - Half Kneeling Hip Flexor Stretch with Posterior Pelvic Tilt and Dowel  - 2-3 x daily - 7 x weekly - 3 reps - 30 sec hold - Seated Table Piriformis Stretch  - 2-3 x daily - 7 x weekly - 3 reps -  30 sec hold - Seated Hip Adductor Stretch  - 2-3 x daily - 7 x weekly - 3 reps - 30 sec hold - Standing ITB Stretch  - 2-3 x daily - 7 x weekly - 3 reps - 30 sec hold - Standard Lunge  - 1 x daily - 7 x weekly - 2 sets - 10 reps - 3 sec hold - Bridge with Hip Abduction and Resistance  - 1 x daily - 7 x weekly - 2 sets - 10 reps - 5 sec hold - Clamshell with Resistance  - 1 x daily - 7 x weekly - 2 sets - 10 reps - 3-5 sec hold - Hip Flexor Mobilization with Foam Roll  - 1 x daily - 1-2 min hold - Roller Massager Elongated Hip Flexor Release  - 1 x daily - 1-2 min hold - TFL Release With [employer] Against Wall  - 1 x daily - 1-2 min hold  Patient Education - Trigger Point Dry Needling  ASSESSMENT:  CLINICAL IMPRESSION: Alex "[name]" reports limited compliance with HEP recently as he has busy with a renovation project for a property he owns. His strength is improving with  B knees and ankles at full strength and L hip now symmetrical to or stronger than R hip for all motions except L hip abduction (pain with MMT resistance). 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 He expressed interest in trying further DN today with treatment focusing on hip flexors and quads as he denies any TTP and minimal to no increased muscle tension palpated in glutes today. Good twitch responses elicited resulting in palpable reduction in muscle tension. Pt noting less strain in hip while pillow placed between knee for positioning during DN, therefore recommended trial of sleeping with pillow between knees at night to reduce hip strain.  OBJECTIVE IMPAIRMENTS Abnormal gait, decreased activity tolerance, decreased knowledge of condition, difficulty walking, decreased ROM, decreased strength, increased fascial restrictions, impaired perceived functional ability, increased muscle spasms, impaired flexibility, and pain.   ACTIVITY LIMITATIONS community activity, yard work, shopping, and working out .   PERSONAL FACTORS Past/current experiences, Time since onset of injury/illness/exacerbation, and 3+ comorbidities: OA, BPH, anemia, hypothyroidism, MVR & TVR, vertigo  are also affecting patient's functional outcome.    REHAB POTENTIAL: Good  CLINICAL DECISION MAKING: Stable/uncomplicated  EVALUATION COMPLEXITY: Low   GOALS: Goals reviewed with patient? Yes  SHORT TERM GOALS: Target date: 05/28/2022   Patient will be independent with initial HEP. Baseline: Initial HEP provided on eval Goal status: MET  (06/04/22)  LONG TERM GOALS: Target date: 06/18/2022   Patient will be independent with advanced/ongoing HEP to improve outcomes and carryover.  Baseline: initial HEP provided on eval Goal status: IN PROGRESS  2.  Patient will report at least 75% improvement in L hip pain to improve  QOL. Baseline: L hip pain up to 10/10 at times with walking Goal status: IN PROGRESS  3.  Patient will demonstrate improved functional LE strength as demonstrated by increased B LE strength to >/= 4+/5. Baseline: Refer to above MMT chart Goal status: IN PROGRESS  (06/07/22 - met for knees and ankle, partially met for hips)  4.  Patient will be able to ambulate 600' with LRAD and normal gait pattern including full stride length without increased pain to access community.  Baseline: Limited stride d/t L hip pain Goal  status: IN PROGRESS  5.  Patient will report 39 on Hip FOTO to demonstrate improved functional ability. Baseline: 63 Goal status: IN PROGRESS  6.  Patient to report ability to perform ADLs, household, and leisure activities without limitation due to L hip pain, LOM or weakness. Baseline: Limited walking tolerance, has to alter gym workouts Goal status: IN PROGRESS      PLAN: PT FREQUENCY: 2x/week  PT DURATION: 6 weeks  PLANNED INTERVENTIONS: Therapeutic exercises, Therapeutic activity, Neuromuscular re-education, Balance training, Gait training, Patient/Family education, Joint mobilization, Dry Needling, Electrical stimulation, Moist heat, Taping, Ultrasound, Ionotophoresis 4mg /ml Dexamethasone, Manual therapy, and Re-evaluation  PLAN FOR NEXT SESSION:  progress hip flexibility and strengthening - review & update HEP PRN; MT +/- DN to normalize muscle tension; modalities PRN for pain management   Percival Spanish, PT 06/07/2022, 9:50 AM

## 2022-06-07 NOTE — Telephone Encounter (Signed)
BCBS Auth: [id number] exp. 06/07/22-07/06/22 sent to GI

## 2022-06-11 ENCOUNTER — Ambulatory Visit: Payer: BC Managed Care – PPO

## 2022-06-13 ENCOUNTER — Ambulatory Visit
Admission: RE | Admit: 2022-06-13 | Discharge: 2022-06-13 | Disposition: A | Discharge status: Home / Self Care | Payer: BC Managed Care – PPO | Source: Ambulatory Visit | Attending: Neurology | Admitting: Neurology

## 2022-06-13 DIAGNOSIS — M542 Cervicalgia: Secondary | ICD-10-CM | POA: Diagnosis not present

## 2022-06-13 DIAGNOSIS — R531 Weakness: Secondary | ICD-10-CM | POA: Diagnosis not present

## 2022-06-14 ENCOUNTER — Encounter: Payer: Self-pay | Admitting: Physical Therapy

## 2022-06-14 ENCOUNTER — Ambulatory Visit: Payer: BC Managed Care – PPO | Admitting: Physical Therapy

## 2022-06-14 DIAGNOSIS — M25552 Pain in left hip: Secondary | ICD-10-CM

## 2022-06-14 DIAGNOSIS — R262 Difficulty in walking, not elsewhere classified: Secondary | ICD-10-CM

## 2022-06-14 DIAGNOSIS — M6281 Muscle weakness (generalized): Secondary | ICD-10-CM | POA: Diagnosis not present

## 2022-06-14 DIAGNOSIS — R2689 Other abnormalities of gait and mobility: Secondary | ICD-10-CM | POA: Diagnosis not present

## 2022-06-17 ENCOUNTER — Telehealth: Payer: Self-pay | Admitting: Neurology

## 2022-06-17 DIAGNOSIS — R944 Abnormal results of kidney function studies: Secondary | ICD-10-CM | POA: Diagnosis not present

## 2022-06-17 DIAGNOSIS — G959 Disease of spinal cord, unspecified: Secondary | ICD-10-CM | POA: Insufficient documentation

## 2022-06-17 DIAGNOSIS — R799 Abnormal finding of blood chemistry, unspecified: Secondary | ICD-10-CM | POA: Diagnosis not present

## 2022-06-17 NOTE — Telephone Encounter (Signed)
Pt is asking to be called to discuss the MRI results

## 2022-06-18 ENCOUNTER — Ambulatory Visit: Payer: BC Managed Care – PPO | Admitting: Neurology

## 2022-06-18 ENCOUNTER — Encounter: Payer: Self-pay | Admitting: Neurology

## 2022-06-18 ENCOUNTER — Ambulatory Visit: Payer: BC Managed Care – PPO | Admitting: Physical Therapy

## 2022-06-18 ENCOUNTER — Encounter: Payer: Self-pay | Admitting: Physical Therapy

## 2022-06-18 VITALS — BP 128/81 | HR 65 | Ht 72.0 in | Wt 248.0 lb

## 2022-06-18 DIAGNOSIS — G959 Disease of spinal cord, unspecified: Secondary | ICD-10-CM

## 2022-06-18 DIAGNOSIS — R2689 Other abnormalities of gait and mobility: Secondary | ICD-10-CM

## 2022-06-18 DIAGNOSIS — M542 Cervicalgia: Secondary | ICD-10-CM | POA: Diagnosis not present

## 2022-06-18 DIAGNOSIS — R269 Unspecified abnormalities of gait and mobility: Secondary | ICD-10-CM | POA: Diagnosis not present

## 2022-06-18 DIAGNOSIS — M6281 Muscle weakness (generalized): Secondary | ICD-10-CM | POA: Diagnosis not present

## 2022-06-18 DIAGNOSIS — R531 Weakness: Secondary | ICD-10-CM

## 2022-06-18 DIAGNOSIS — M25552 Pain in left hip: Secondary | ICD-10-CM

## 2022-06-18 DIAGNOSIS — R262 Difficulty in walking, not elsewhere classified: Secondary | ICD-10-CM | POA: Diagnosis not present

## 2022-06-18 MED ORDER — GABAPENTIN 300 MG PO CAPS: 300.0000 mg | ORAL_CAPSULE | Freq: Three times a day (TID) | ORAL | 11 refills | Status: DC

## 2022-06-18 NOTE — Progress Notes (Signed)
Chief Complaint  Patient presents with   Follow-up    Rm 13. Accompanied by wife, Bolivar Haw. MRI follow up.      ASSESSMENT AND PLAN  Alex West is a 66 y.o. male  Known history of cervical degenerative disease, worsening neck pain, bilateral upper extremity paresthesia, and weakness Gait abnormality, which is also complicated by his left hip degenerative disease  MRI of cervical spine clearly show severe spinal stenosis at C3-4, C5-6,, with evidence of myelomalacia at C3-4 level, progressive worsening upper and lower extremity sensorimotor symptoms, hyperreflexia,  I have referred him to neurosurgeon for decompression surgery,  Gabapentin 300 mg 3 times a day as needed  DIAGNOSTIC DATA (LABS, IMAGING, TESTING) - I reviewed patient records, labs, notes, testing and imaging myself where available.   MEDICAL HISTORY:  Alex West is a 66 year old male, seen in request by his primary care doctor Daisy Floro, for evaluation of bilateral arm numbness, weakness right worse than left  I reviewed and summarized the referring note.PMHX Hypothyroidism Hyperlipidemia Mitral valve regurgitation by echo Elevated liver enzymes in December 2013, ultrasound showed fatty infiltration of liver, with positive ANA, positive actin and antimitochondrial chondral antibody, negative hepatitis ABC, normal ferritin, was seen by [place] hepatologist, liver biopsy in March 2014, chronic inflammation consistent with autoimmune hepatitis, good response to budesonide, transition to Imuran September 2015, he is currently taking Imuran 50 mg 1 and half tablets daily  Since the beginning of 2023, he noticed bilateral arm numbness, initially had neck pain radiating pain to left shoulder, now achiness sensation, bilateral shoulder, upper extremity, persistent numbness of right thumb, left fourth and fifth fingers,  He also noticed the weakness, he used to workout heavy lifting, now is really limited, it  is even difficult for him to open jars,  He denies bowel and bladder incontinence, he noticed gait abnormality, but contributed to his severe left hip pain, MRI of left hip in January 23 showed severe osteoarthritis of left hip, with moderate effusion, degeneration of lateral labrum with laceration of the anterior labrum  Update June 18, 2022: He is accompanied by his wife at today's visit, reported bilateral arm weakness, wasting of the muscles, gait abnormality, moderate left hip pain,  We personally reviewed MRI cervical spine on June 13, 2022, severe spinal stenosis at C3-4, moderate C4-5, with evidence of myelomalacia adjacent to C3-4, variable degree of foraminal narrowing at multiple levels  He does need cervical decompression to prevent further worsening of his cervical myelopathy symptoms  PHYSICAL EXAM:   Vitals:   06/18/22 1551  BP: 128/81  Pulse: 65  Weight: 248 lb (112.5 kg)  Height: 6' (1.829 m)   Not recorded     Body mass index is 33.63 kg/m.  PHYSICAL EXAMNIATION:  Gen: NAD, conversant, well nourised, well groomed                     Cardiovascular: Regular rate rhythm, no peripheral edema, warm, nontender. Eyes: Conjunctivae clear without exudates or hemorrhage Neck: Supple, no carotid bruits. Pulmonary: Clear to auscultation bilaterally   NEUROLOGICAL EXAM:  MENTAL STATUS: Speech/cognition: Awake, alert, oriented to history taking and casual conversation CRANIAL NERVES: CN II: Visual fields are full to confrontation. Pupils are round equal and briskly reactive to light. CN III, IV, VI: extraocular movement are normal. No ptosis. CN V: Facial sensation is intact to light touch CN VII: Face is symmetric with normal eye closure  CN VIII: Hearing is normal to causal conversation.  CN IX, X: Phonation is normal. CN XI: Head turning and shoulder shrug are intact  MOTOR:  UE Shoulder Abduction Shoulder External Rotation Elbow Flexion Elbow  Extension  Wrist Flexion Wrist Extension Grip Finger  Abduction  R 4+ $+ 4+ 5- 5 5 5  5-  L 4  4 4 5 5 5 5  5-   LE Hip Flexion Knee flexion Knee extension Ankle Dorsiflexion Eversion Ankle plantar Flexion Inversion  R 5 5 5 5 5 5 5   L 4 5 5 5 5 5 5     Limited range of motion of left hip due to pain  REFLEXES: Reflexes (R/L) brachioradialis 0/2  biceps 1/2, triceps 2/2, knees 3/3 , and ankles 2/2 . Plantar responses are extensor bilaterally  SENSORY: Intact to light touch, pinprick and vibratory sensation are intact in fingers and toes.  COORDINATION: There is no trunk or limb dysmetria noted.  GAIT/STANCE:  push-up to get up from seated position, mildly antalgic, could perform tandem walking, could stand up on heel and tiptoe,  REVIEW OF SYSTEMS:  Full 14 system review of systems performed and notable only for as above All other review of systems were negative.   ALLERGIES: No Known Allergies  HOME MEDICATIONS: Current Outpatient Medications  Medication Sig Dispense Refill   azaTHIOprine (IMURAN) 50 MG tablet Take by mouth.     levothyroxine (SYNTHROID, LEVOTHROID) 150 MCG tablet Take 150 mcg by mouth daily.     sildenafil (VIAGRA) 100 MG tablet Take 100 mg by mouth as needed for erectile dysfunction.     Current Facility-Administered Medications  Medication Dose Route Frequency Provider Last Rate Last Admin   acetaminophen (TYLENOL) tablet 975 mg  975 mg Oral Q4H PRN Copland, Gwenlyn Found, MD        PAST MEDICAL HISTORY: Past Medical History:  Diagnosis Date   BPH (benign prostatic hyperplasia)    Colon polyp    ED (erectile dysfunction)    Elevated liver enzymes    Gallstones    Hepatitis    History of anemia    Hypercholesterolemia    Hypothyroidism    Mild tricuspid regurgitation    Mitral valve regurgitation    Thyroid disease    Vertigo     PAST SURGICAL HISTORY: Past Surgical History:  Procedure Laterality Date   FLEXIBLE SIGMOIDOSCOPY      FAMILY  HISTORY: Family History  Problem Relation Age of Onset   Cancer Mother        pancreatic   Cancer Father        stomach   Kidney failure Brother    Other Brother        neck tumor   Hypertension Brother    Diabetes Brother    Lupus Daughter    Hypertension Sister    Other Son        boating accident    SOCIAL HISTORY: Social History   Socioeconomic History   Marital status: Married    Spouse name: Not on file   Number of children: Not on file   Years of education: Not on file   Highest education level: Not on file  Occupational History   Occupation: Sales  Tobacco Use   Smoking status: Former   Smokeless tobacco: Never  Substance and Sexual Activity   Alcohol use: No   Drug use: No   Sexual activity: Not on file  Other Topics Concern   Not on file  Social History Narrative   Not on file  Social Determinants of Health   Financial Resource Strain: Not on file  Food Insecurity: Not on file  Transportation Needs: Not on file  Physical Activity: Not on file  Stress: Not on file  Social Connections: Not on file  Intimate Partner Violence: Not on file      Levert Feinstein, M.D. Ph.D.  [facility] [address] [place], [place] [address] Ph: [phone] Fax: [phone]  CC:  Daisy Floro, MD [address] [place],  [place] [address]  Daisy Floro, MD

## 2022-06-18 NOTE — Telephone Encounter (Signed)
Appt made

## 2022-06-18 NOTE — Therapy (Signed)
OUTPATIENT PHYSICAL THERAPY TREATMENT / DISCHARGE SUMMARY   Patient Name: Alex West MRN: [id number] DOB:Apr 12, 1956, 66 y.o., male Today's Date: 06/18/2022   PT End of Session - 06/18/22 0848     Visit Number 7    Date for PT Re-Evaluation 06/18/22    Authorization Type BCBS - VL: 60 (PT/OT/Chiro)    PT Start Time 0848    PT Stop Time 0922    PT Time Calculation (min) 34 min    Activity Tolerance Patient tolerated treatment well    Behavior During Therapy WFL for tasks assessed/performed                   Past Medical History:  Diagnosis Date   BPH (benign prostatic hyperplasia)    Colon polyp    ED (erectile dysfunction)    Elevated liver enzymes    Gallstones    Hepatitis    History of anemia    Hypercholesterolemia    Hypothyroidism    Mild tricuspid regurgitation    Mitral valve regurgitation    Thyroid disease    Vertigo    Past Surgical History:  Procedure Laterality Date   FLEXIBLE SIGMOIDOSCOPY     Patient Active Problem List   Diagnosis Date Noted   Cervical myelopathy (HCC) 06/17/2022   Weakness 06/06/2022   Neck pain 06/06/2022   OA (osteoarthritis) of hip 07/24/2021   Immunosuppressed status (HCC) 09/05/2014   Hepatitis, autoimmune (HCC) 09/05/2014    PCP: Daisy Floro, MD  REFERRING PROVIDER: Myra Rude, MD  REFERRING DIAG: [id number] (ICD-10-CM) - Primary osteoarthritis of left hip  THERAPY DIAG:  Pain in left hip  Difficulty in walking, not elsewhere classified  Other abnormalities of gait and mobility  Muscle weakness (generalized)  RATIONALE FOR EVALUATION AND TREATMENT:  Rehabilitation  ONSET DATE: Acute on chronic in nature.  SUBJECTIVE:   SUBJECTIVE STATEMENT: Pt reports "I feel pretty good this morning". He notes increased pain following his HEP yesterday, but woke up feeling better than normal this morning.  PAIN:  Are you having pain? No  PERTINENT HISTORY: OA, BPH, anemia, hypothyroidism, MVR &  TVR, vertigo   PRECAUTIONS: None  WEIGHT BEARING RESTRICTIONS No  FALLS:  Has patient fallen in last 6 months? No  LIVING ENVIRONMENT: Lives with: lives with their spouse Lives in: House/apartment Stairs: Yes: Internal: 14 steps; on right going up Has following equipment at home: None  OCCUPATION: Retired  PLOF: Independent and Leisure: working out at gym 5x/wk, runs a non-profit  PATIENT GOALS "To get my stride back."   OBJECTIVE:   DIAGNOSTIC FINDINGS:  12/29/21 L hip MRI: 1. Severe osteoarthritis of the left hip. Moderate left hip joint effusion.  2. Degeneration of the left labrum with maceration of the anterior labrum.  PATIENT SURVEYS:  FOTO Hip = 63, predicted = 73 ; D/C = 55  COGNITION:  Overall cognitive status: Within functional limits for tasks assessed     SENSATION: WFL  MUSCLE LENGTH: Hamstrings: mild/mod tight L>R ITB: mod tight B Quads & hip flexors: mod tight L>R Piriformis: mod/severe tight L, mod tight R   LE ROM: B hip ROM mild to moderately limited in all planes with greatest limitation in L hip flexion and IR/ER  LE MMT:  MMT Right 05/07/22 Left 05/07/22 Right 06/07/22 Left 06/07/22 Right 06/18/22 Left 06/18/22  Hip flexion 5 5 5 5 5 5   Hip extension 4 4 4  4+ 4+ 4+  Hip abduction 4-  4- (pain) 4  4- (pain) 4+ 4+  Hip adduction 4 4+ 4+ 4+ 4+ 4+  Hip internal rotation 4+ 4+ 5 5 5 5   Hip external rotation 4+ 4+ 4+ 5 5 5   Knee flexion 4+ 5 5 5 5 5   Knee extension 5 5 5 5 5 5   Ankle dorsiflexion 5 5 5 5  5- 5  Ankle plantarflexion        Ankle inversion        Ankle eversion         (Blank rows = not tested)  GAIT: Distance walked: 60 Assistive device utilized: None Level of assistance: Complete Independence Comments: Decreased stride length due to limited L hip extension     TODAY'S TREATMENT:  06/18/22 THERAPEUTIC EXERCISE: to improve strength and mobility.  Verbal and tactile cues throughout for technique. NuStep L6 x 6  min  GAIT: 270 ft w/o AD - normal gait pattern with full stride length  SELF CARE: Review of recommended frequency for ongoing HEP to maintain good hip flexibility and strength in preparation if he chooses to proceed with THA   06/14/22 THERAPEUTIC EXERCISE: to improve strength and mobility.  Verbal and tactile cues throughout for technique. NuStep L6 x 7 min Standing GTB L/R 4-way hip SLR (flexion, ADD, extension & ABD) x 10 reps each, 2 pole A for balance Qudruped R/L fire hydrant 10 x 3'   06/07/22 THERAPEUTIC EXERCISE: to improve flexibility, strength and mobility.  Verbal and tactile cues throughout for technique. Rec bike L4 x 6 min  MANUAL THERAPY: to reduce muscle tension/TTP and improve flexibility Trigger Point Dry-Needling  Treatment instructions: Expect mild to moderate muscle soreness. Patient verbalized understanding of these instructions and education.  Patient Consent Given: Yes Education handout provided: Previously provided Muscles treated: L iliacus, TFL, L proximal quads RF/VI/VL Electrical stimulation performed: No Parameters: N/A Treatment response/outcome: Twitch Response Elicited and Palpable Increase in Muscle Length STM/DTM to L hip flexors (proximal iliacus and distal iliopsoas), TFL, glutes, and proximal quads (primarily RF) Manual TPR to L hip flexors (proximal iliacus and distal iliopsoas),   SELF CARE: Discussed sleeping positions and recommended trying a pillow between his knees/legs to promote more neutral hip and spine alignment   PATIENT EDUCATION:  Education details: recommended frequency for ongoing HEP at discharge to prevent loss of gains achieved with PT Person educated: Patient Education method: Explanation Education comprehension: verbalized understanding   HOME EXERCISE PROGRAM: Access Code: [email or web address] URL: [email or web address] Date: 06/14/2022 Prepared by: Glenetta Hew  Exercises - Hooklying Hamstring Stretch with  Strap  - 2-3 x daily - 7 x weekly - 3 reps - 30 sec hold - Supine Quadriceps Stretch with Strap on Table  - 2-3 x daily - 7 x weekly - 3 reps - 30 sec hold - Half Kneeling Hip Flexor Stretch with Posterior Pelvic Tilt and Dowel  - 2-3 x daily - 7 x weekly - 3 reps - 30 sec hold - Seated Table Piriformis Stretch  - 2-3 x daily - 7 x weekly - 3 reps - 30 sec hold - Seated Hip Adductor Stretch  - 2-3 x daily - 7 x weekly - 3 reps - 30 sec hold - Standing ITB Stretch  - 2-3 x daily - 7 x weekly - 3 reps - 30 sec hold - Hip Flexor Mobilization with Foam Roll  - 1 x daily - 1-2 min hold - Roller Massager Elongated Hip Flexor Release  - 1 x daily - 1-2 min hold - TFL  Release With [employer] Against Wall  - 1 x daily - 1-2 min hold - Standard Lunge  - 1 x daily - 3-4 x weekly - 2 sets - 10 reps - 3 sec hold - Bridge with Hip Abduction and Resistance  - 1 x daily - 3-4 x weekly - 2 sets - 10 reps - 5 sec hold - Clamshell with Resistance  - 1 x daily - 3-4 x weekly - 2 sets - 10 reps - 3-5 sec hold - Standing Hip Flexion with Anchored Resistance and Chair Support  - 1 x daily - 3-4 x weekly - 2 sets - 10 reps - 3 sec hold - Standing Hip Adduction with Anchored Resistance  - 1 x daily - 3-4 x weekly - 2 sets - 10 reps - 3 sec hold - Standing Hip Extension with Anchored Resistance  - 1 x daily - 3-4 x weekly - 2 sets - 10 reps - 3 sec hold - Standing Hip Abduction with Anchored Resistance  - 1 x daily - 3-4 x weekly - 2 sets - 10 reps - 3 sec hold - Quadruped Hip Abduction and External Rotation  - 1 x daily - 3-4 x weekly - 2 sets - 10 reps - 3 sec hold  Patient Education - Trigger Point Dry Needling  ASSESSMENT:  CLINICAL IMPRESSION: Yvonne "[name]" reports 90% improvement in the muscle tension and tightness in his L hip which has allowed him to resume a normal stride length when walking w/o increased pain or limited ROM. His current issues now seem to be more related to his hip OA with variable  pain and instability still noted w/o predictable triggers - some days with no issues while other days with L hip feeling like it may buckle w/o warning. His B LE strength has improved to to 4+/5 to 5/5 but he continues to note increased pain related to fatigue and the advanced L hip OA when he overdoes things with activity or exercise. We reviewed recommended frequency for ongoing HEP, limiting strengthening exercises to only 3-4 per day and rotating through different exercises on subsequent days to prevent over-fatiguing the muscles. Majority of PT goals now met or partially met. [name] reports good understanding and comfort with the HEP and feels ready to proceed with transition to his HEP and discharge from PT.He still thinks he will need THA but reports he has recently learned that he will need neck surgery due to nerve impingement, so he is hoping to f/u with Dr. Jordan Likes regarding a possible cortisone injection for his L hip to help him hold out on dealing with the THA until he can deal with his neck surgery.   OBJECTIVE IMPAIRMENTS Abnormal gait, decreased activity tolerance, decreased knowledge of condition, difficulty walking, decreased ROM, decreased strength, increased fascial restrictions, impaired perceived functional ability, increased muscle spasms, impaired flexibility, and pain.   ACTIVITY LIMITATIONS community activity, yard work, shopping, and working out .   PERSONAL FACTORS Past/current experiences, Time since onset of injury/illness/exacerbation, and 3+ comorbidities: OA, BPH, anemia, hypothyroidism, MVR & TVR, vertigo  are also affecting patient's functional outcome.    REHAB POTENTIAL: Good  CLINICAL DECISION MAKING: Stable/uncomplicated  EVALUATION COMPLEXITY: Low   GOALS: Goals reviewed with patient? Yes  SHORT TERM GOALS: Target date: 05/28/2022   Patient will be independent with initial HEP. Baseline: Initial HEP provided on eval Goal status: MET  06/04/22  LONG TERM  GOALS: Target date: 06/18/2022   Patient will be independent  with advanced/ongoing HEP to improve outcomes and carryover.  Baseline: initial HEP provided on eval Goal status: MET 06/18/22   2.  Patient will report at least 75% improvement in L hip pain to improve QOL. Baseline: L hip pain up to 10/10 at times with walking Goal status: PARTIALLY MET  06/18/22 - Pt reports 90% improvement in his L hip ROM w/o pain limiting, but only 25% improvement in pain with average pain at worst now only 4-5/10  3.  Patient will demonstrate improved functional LE strength as demonstrated by increased B LE strength to >/= 4+/5. Baseline: Refer to above MMT chart Goal status: MET  06/18/22   4.  Patient will be able to ambulate 600' with LRAD and normal gait pattern including full stride length without increased pain to access community.  Baseline: Limited stride d/t L hip pain Goal status: PARTIALLY MET 06/18/22 - Pt reports ability to take full stride now (improved ROM) but notes L hip unpredictable regarding stability which makes him more hesitant with walking   5.  Patient will report 79 on Hip FOTO to demonstrate improved functional ability. Baseline: 63 Goal status: NOT MET  06/18/22 - FOTO = 55  6.  Patient to report ability to perform ADLs, household, and leisure activities without limitation due to L hip pain, LOM or weakness. Baseline: Limited walking tolerance, has to alter gym workouts Goal status: PARTIALLY MET  06/18/22 - L hip unpredictable regarding pain and stability - some days with no issues noted but other days where hip feels prone to buckling which he attributes to fatigue    PLAN: PT FREQUENCY: 2x/week  PT DURATION: 6 weeks  PLANNED INTERVENTIONS: Therapeutic exercises, Therapeutic activity, Neuromuscular re-education, Balance training, Gait training, Patient/Family education, Joint mobilization, Dry Needling, Electrical stimulation, Moist heat, Taping, Ultrasound, Ionotophoresis  4mg /ml Dexamethasone, Manual therapy, and Re-evaluation  PLAN FOR NEXT SESSION:  transition to HEP with discharge from PT    PHYSICAL THERAPY DISCHARGE SUMMARY  Visits from Start of Care: 7  Current functional level related to goals / functional outcomes:   Refer to above clinical impression.   Remaining deficits:   Advanced L hip OA contributing to limited activity tolerance   Education / Equipment:   HEP   Patient agrees to discharge. Patient goals were partially met. Patient is being discharged due to being pleased with the current functional level.   Marry Guan, PT 06/18/2022, 12:33 PM

## 2022-06-19 ENCOUNTER — Telehealth: Payer: Self-pay | Admitting: Neurology

## 2022-06-19 NOTE — Telephone Encounter (Signed)
Referral for Neurosurgery sent to [facility] [phone]. 

## 2022-06-26 ENCOUNTER — Ambulatory Visit (INDEPENDENT_AMBULATORY_CARE_PROVIDER_SITE_OTHER): Payer: BC Managed Care – PPO | Admitting: Family Medicine

## 2022-06-26 ENCOUNTER — Encounter: Payer: Self-pay | Admitting: Family Medicine

## 2022-06-26 DIAGNOSIS — M1612 Unilateral primary osteoarthritis, left hip: Secondary | ICD-10-CM

## 2022-06-26 NOTE — Progress Notes (Signed)
  Alex West - 66 y.o. male MRN [id number]  Date of birth: [birth year]  SUBJECTIVE:  Including CC & ROS.  No chief complaint on file.   Alex West is a 66 y.o. male that is following up for his left hip pain.  He has tried physical therapy and still notices pain at the end of the day.   Review of Systems See HPI   HISTORY: Past Medical, Surgical, Social, and Family History Reviewed & Updated per EMR.   Pertinent Historical Findings include:  Past Medical History:  Diagnosis Date   BPH (benign prostatic hyperplasia)    Colon polyp    ED (erectile dysfunction)    Elevated liver enzymes    Gallstones    Hepatitis    History of anemia    Hypercholesterolemia    Hypothyroidism    Mild tricuspid regurgitation    Mitral valve regurgitation    Thyroid disease    Vertigo     Past Surgical History:  Procedure Laterality Date   FLEXIBLE SIGMOIDOSCOPY       PHYSICAL EXAM:  VS: BP 118/78 (BP Location: Right Arm, Patient Position: Sitting, Cuff Size: Normal)   Ht '6\' 3"'$  (1.905 m)   Wt 245 lb (111.1 kg)   BMI 30.62 kg/m  Physical Exam Gen: NAD, alert, cooperative with exam, well-appearing MSK:  Neurovascularly intact       ASSESSMENT & PLAN:   OA (osteoarthritis) of hip Acute on chronic in nature.  Still has pain despite conservative efforts. -Counseled on home exercise therapy and supportive care. -Pursue [facility].

## 2022-06-26 NOTE — Assessment & Plan Note (Signed)
Acute on chronic in nature.  Still has pain despite conservative efforts. -Counseled on home exercise therapy and supportive care. -Pursue [facility].

## 2022-07-05 DIAGNOSIS — M4712 Other spondylosis with myelopathy, cervical region: Secondary | ICD-10-CM | POA: Diagnosis not present

## 2022-07-09 ENCOUNTER — Other Ambulatory Visit: Payer: Self-pay | Admitting: Neurosurgery

## 2022-07-12 ENCOUNTER — Telehealth: Payer: Self-pay | Admitting: *Deleted

## 2022-07-12 NOTE — Telephone Encounter (Signed)
  Received: Today Crist Fat, CMA Pt  ask if we can hold the [place] sample for him til after his procedure 8/1?   FYI,   glh

## 2022-07-15 ENCOUNTER — Other Ambulatory Visit: Payer: Self-pay | Admitting: Neurosurgery

## 2022-07-19 ENCOUNTER — Other Ambulatory Visit: Payer: Self-pay

## 2022-07-19 ENCOUNTER — Encounter (HOSPITAL_COMMUNITY)
Admission: RE | Admit: 2022-07-19 | Discharge: 2022-07-19 | Disposition: A | Discharge status: Home / Self Care | Payer: BC Managed Care – PPO | Source: Ambulatory Visit | Attending: Neurosurgery | Admitting: Neurosurgery

## 2022-07-19 ENCOUNTER — Encounter (HOSPITAL_COMMUNITY): Payer: Self-pay

## 2022-07-19 VITALS — BP 109/76 | HR 64 | Temp 98.3°F | Resp 17 | Ht 75.0 in | Wt 243.8 lb

## 2022-07-19 DIAGNOSIS — Z01818 Encounter for other preprocedural examination: Secondary | ICD-10-CM

## 2022-07-19 HISTORY — DX: Unspecified osteoarthritis, unspecified site: M19.90

## 2022-07-19 LAB — CBC
HCT: 40.8 % (ref 39.0–52.0)
Hemoglobin: 14.1 g/dL (ref 13.0–17.0)
MCH: 32.5 pg (ref 26.0–34.0)
MCHC: 34.6 g/dL (ref 30.0–36.0)
MCV: 94 fL (ref 80.0–100.0)
Platelets: 163 10*3/uL (ref 150–400)
RBC: 4.34 MIL/uL (ref 4.22–5.81)
RDW: 12.1 % (ref 11.5–15.5)
WBC: 5.4 10*3/uL (ref 4.0–10.5)
nRBC: 0 % (ref 0.0–0.2)

## 2022-07-19 LAB — COMPREHENSIVE METABOLIC PANEL
ALT: 15 U/L (ref 0–44)
AST: 23 U/L (ref 15–41)
Albumin: 4 g/dL (ref 3.5–5.0)
Alkaline Phosphatase: 50 U/L (ref 38–126)
Anion gap: 5 (ref 5–15)
BUN: 12 mg/dL (ref 8–23)
CO2: 27 mmol/L (ref 22–32)
Calcium: 9.4 mg/dL (ref 8.9–10.3)
Chloride: 108 mmol/L (ref 98–111)
Creatinine, Ser: 1.35 mg/dL — ABNORMAL HIGH (ref 0.61–1.24)
GFR, Estimated: 59 mL/min — ABNORMAL LOW (ref >60)
Glucose, Bld: 94 mg/dL (ref 70–99)
Potassium: 4.3 mmol/L (ref 3.5–5.1)
Sodium: 140 mmol/L (ref 135–145)
Total Bilirubin: 0.8 mg/dL (ref 0.3–1.2)
Total Protein: 7.6 g/dL (ref 6.5–8.1)

## 2022-07-19 LAB — SURGICAL PCR SCREEN
MRSA, PCR: NEGATIVE
Staphylococcus aureus: NEGATIVE

## 2022-07-19 LAB — TYPE AND SCREEN
ABO/RH(D): B POS
Antibody Screen: NEGATIVE

## 2022-07-19 NOTE — Progress Notes (Signed)
Surgical Instructions    Your procedure is scheduled on July 23, 2022 at 7:30 AM.  Report to [facility] Main Entrance "A" at 5:30 AM, then check in with the Admitting office.  Call this number if you have problems the morning of surgery:  [phone]   If you have any questions prior to your surgery date call [phone]: Open Monday-Friday 8am-4pm    Remember:  Do not eat or drink after midnight the night before your surgery.    Take these medicines the morning of surgery with A SIP OF WATER: Levothyroxine (Synthroid), Gabapentin (Neurontin) - if needed  Stop Imuran as of today prior to surgery.  As of today, STOP taking any Aspirin (unless otherwise instructed by your surgeon) Aleve, Naproxen, Ibuprofen, Motrin, Advil, Goody's, BC's, all herbal medications, fish oil, and all vitamins.      Do not wear jewelry. Do not wear lotions, powders, cologne or deodorant. Do not shave 48 hours prior to surgery.  Men may shave face and neck. Do not bring valuables to the hospital.   [facility] is not responsible for any belongings or valuables. .   Do NOT Smoke (Tobacco/Vaping)  24 hours prior to your procedure  If you use a CPAP at night, you may bring your mask for your overnight stay.   Contacts, glasses, hearing aids, dentures or partials may not be worn into surgery, please bring cases for these belongings   For patients admitted to the hospital, discharge time will be determined by your treatment team.   Patients discharged the day of surgery will not be allowed to drive home, and someone needs to stay with them for 24 hours.   SURGICAL WAITING ROOM VISITATION Patients having surgery or a procedure may have no more than 2 support people in the waiting area - these visitors may rotate.   Children under the age of 14 must have an adult with them who is not the patient. If the patient needs to stay at the hospital during part of their recovery, the visitor guidelines for  inpatient rooms apply. Pre-op nurse will coordinate an appropriate time for 1 support person to accompany patient in pre-op.  This support person may not rotate.   Please refer to the [facility] website for the visitor guidelines for Inpatients (after your surgery is over and you are in a regular room).    Special instructions:    Oral Hygiene is also important to reduce your risk of infection.  Remember - BRUSH YOUR TEETH THE MORNING OF SURGERY WITH YOUR REGULAR TOOTHPASTE   [place]- Preparing For Surgery  Before surgery, you can play an important role. Because skin is not sterile, your skin needs to be as free of germs as possible. You can reduce the number of germs on your skin by washing with CHG (chlorahexidine gluconate) Soap before surgery.  CHG is an antiseptic cleaner which kills germs and bonds with the skin to continue killing germs even after washing.     Please do not use if you have an allergy to CHG or antibacterial soaps. If your skin becomes reddened/irritated stop using the CHG.  Do not shave (including legs and underarms) for at least 48 hours prior to first CHG shower. It is OK to shave your face.  Please follow these instructions carefully.     Shower the NIGHT BEFORE SURGERY and the MORNING OF SURGERY with CHG Soap.   If you chose to wash your hair, wash your hair first as  usual with your normal shampoo. After you shampoo, rinse your hair and body thoroughly to remove the shampoo.  Then [employer] and genitals (private parts) with your normal soap and rinse thoroughly to remove soap.  After that Use CHG Soap as you would any other liquid soap. You can apply CHG directly to the skin and wash gently with a scrungie or a clean washcloth.   Apply the CHG Soap to your body ONLY FROM THE NECK DOWN.  Do not use on open wounds or open sores. Avoid contact with your eyes, ears, mouth and genitals (private parts). Wash Face and genitals (private parts)  with your normal  soap.   Wash thoroughly, paying special attention to the area where your surgery will be performed.  Thoroughly rinse your body with warm water from the neck down.  DO NOT shower/wash with your normal soap after using and rinsing off the CHG Soap.  Pat yourself dry with a CLEAN TOWEL.  Wear CLEAN PAJAMAS to bed the night before surgery  Place CLEAN SHEETS on your bed the night before your surgery  DO NOT SLEEP WITH PETS.   Day of Surgery:  Take a shower with CHG soap. Wear Clean/Comfortable clothing the morning of surgery Do not apply any deodorants/lotions.   Remember to brush your teeth WITH YOUR REGULAR TOOTHPASTE.    If you received a COVID test during your pre-op visit, it is requested that you wear a mask when out in public, stay away from anyone that may not be feeling well, and notify your surgeon if you develop symptoms. If you have been in contact with anyone that has tested positive in the last 10 days, please notify your surgeon.    Please read over the following fact sheets that you were given.

## 2022-07-19 NOTE — Progress Notes (Signed)
PCP - Lawerance Cruel, MD Cardiologist - denies  PPM/ICD - denies Device Orders - n/a Rep Notified - n/a  Chest x-ray - n/a EKG - 07/19/2022 Stress Test - denies ECHO - 06/11/2021 Cardiac Cath - denies  Sleep Study - denies CPAP - n/a  Fasting Blood Sugar - n/a  Blood Thinner Instructions: n/a  Aspirin Instructions: Patient was instructed: As of today, STOP taking any Aspirin (unless otherwise instructed by your surgeon) Aleve, Naproxen, Ibuprofen, Motrin, Advil, Goody's, BC's, all herbal medications, fish oil, and all vitamins  ERAS Protcol - n/a  COVID TEST- n/a   Anesthesia review: yes - hx of Mitral Valve Regurgitation and Mitral Tricuspid Regurgitation  Patient denies shortness of breath, fever, cough and chest pain at PAT appointment   All instructions explained to the patient, with a verbal understanding of the material. Patient agrees to go over the instructions while at home for a better understanding. Patient also instructed to self quarantine after being tested for COVID-19. The opportunity to ask questions was provided.

## 2022-07-23 ENCOUNTER — Observation Stay (HOSPITAL_COMMUNITY)
Admission: RE | Admit: 2022-07-23 | Discharge: 2022-07-24 | Disposition: A | Discharge status: Home / Self Care | Payer: BC Managed Care – PPO | Source: Ambulatory Visit | Attending: Neurosurgery | Admitting: Neurosurgery

## 2022-07-23 ENCOUNTER — Encounter (HOSPITAL_COMMUNITY): Payer: Self-pay

## 2022-07-23 ENCOUNTER — Other Ambulatory Visit: Payer: Self-pay

## 2022-07-23 ENCOUNTER — Ambulatory Visit (HOSPITAL_COMMUNITY): Payer: BC Managed Care – PPO | Admitting: Certified Registered"

## 2022-07-23 ENCOUNTER — Ambulatory Visit (HOSPITAL_COMMUNITY)
Admission: RE | Disposition: A | Discharge status: Home / Self Care | Payer: Self-pay | Source: Ambulatory Visit | Attending: Neurosurgery

## 2022-07-23 ENCOUNTER — Ambulatory Visit (HOSPITAL_COMMUNITY): Payer: BC Managed Care – PPO | Admitting: Physician Assistant

## 2022-07-23 ENCOUNTER — Ambulatory Visit (HOSPITAL_COMMUNITY): Payer: BC Managed Care – PPO

## 2022-07-23 DIAGNOSIS — E039 Hypothyroidism, unspecified: Secondary | ICD-10-CM | POA: Insufficient documentation

## 2022-07-23 DIAGNOSIS — G992 Myelopathy in diseases classified elsewhere: Secondary | ICD-10-CM | POA: Diagnosis present

## 2022-07-23 DIAGNOSIS — Z981 Arthrodesis status: Secondary | ICD-10-CM | POA: Diagnosis not present

## 2022-07-23 DIAGNOSIS — Z9889 Other specified postprocedural states: Secondary | ICD-10-CM | POA: Diagnosis not present

## 2022-07-23 DIAGNOSIS — Z79899 Other long term (current) drug therapy: Secondary | ICD-10-CM | POA: Insufficient documentation

## 2022-07-23 DIAGNOSIS — Z87891 Personal history of nicotine dependence: Secondary | ICD-10-CM | POA: Insufficient documentation

## 2022-07-23 DIAGNOSIS — Z01818 Encounter for other preprocedural examination: Secondary | ICD-10-CM

## 2022-07-23 DIAGNOSIS — M4712 Other spondylosis with myelopathy, cervical region: Secondary | ICD-10-CM | POA: Diagnosis not present

## 2022-07-23 DIAGNOSIS — M4322 Fusion of spine, cervical region: Secondary | ICD-10-CM | POA: Diagnosis not present

## 2022-07-23 DIAGNOSIS — M4802 Spinal stenosis, cervical region: Secondary | ICD-10-CM | POA: Insufficient documentation

## 2022-07-23 HISTORY — PX: ANTERIOR CERVICAL DECOMP/DISCECTOMY FUSION: SHX1161

## 2022-07-23 LAB — ABO/RH: ABO/RH(D): B POS

## 2022-07-23 SURGERY — ANTERIOR CERVICAL DECOMPRESSION/DISCECTOMY FUSION 3 LEVELS
Anesthesia: General

## 2022-07-23 MED ORDER — FENTANYL CITRATE (PF) 100 MCG/2ML IJ SOLN
INTRAMUSCULAR | Status: AC
  Filled 2022-07-23: qty 2

## 2022-07-23 MED ORDER — MIDAZOLAM HCL 2 MG/2ML IJ SOLN
INTRAMUSCULAR | Status: AC
  Filled 2022-07-23: qty 2

## 2022-07-23 MED ORDER — SODIUM CHLORIDE 0.9% FLUSH: 3.0000 mL | INTRAVENOUS | Status: DC | PRN

## 2022-07-23 MED ORDER — PROPOFOL 10 MG/ML IV BOLUS
INTRAVENOUS | Status: DC | PRN
  Administered 2022-07-23: 140 mg via INTRAVENOUS

## 2022-07-23 MED ORDER — HYDROMORPHONE HCL 1 MG/ML IJ SOLN: 0.5000 mg | INTRAMUSCULAR | Status: DC | PRN

## 2022-07-23 MED ORDER — CEFAZOLIN SODIUM-DEXTROSE 2-4 GM/100ML-% IV SOLN
INTRAVENOUS | Status: AC
  Filled 2022-07-23: qty 100

## 2022-07-23 MED ORDER — PHENYLEPHRINE HCL-NACL 20-0.9 MG/250ML-% IV SOLN
INTRAVENOUS | Status: DC | PRN
  Administered 2022-07-23: 30 ug/min via INTRAVENOUS

## 2022-07-23 MED ORDER — POLYETHYLENE GLYCOL 3350 17 G PO PACK: 17.0000 g | PACK | Freq: Every day | ORAL | Status: DC | PRN

## 2022-07-23 MED ORDER — CHLORHEXIDINE GLUCONATE CLOTH 2 % EX PADS: 6.0000 | MEDICATED_PAD | Freq: Once | CUTANEOUS | Status: DC

## 2022-07-23 MED ORDER — ROCURONIUM BROMIDE 10 MG/ML (PF) SYRINGE
PREFILLED_SYRINGE | INTRAVENOUS | Status: DC | PRN
  Administered 2022-07-23: 20 mg via INTRAVENOUS
  Administered 2022-07-23: 50 mg via INTRAVENOUS
  Administered 2022-07-23: 30 mg via INTRAVENOUS
  Administered 2022-07-23: 80 mg via INTRAVENOUS

## 2022-07-23 MED ORDER — MIDAZOLAM HCL 2 MG/2ML IJ SOLN
INTRAMUSCULAR | Status: DC | PRN
  Administered 2022-07-23: 2 mg via INTRAVENOUS

## 2022-07-23 MED ORDER — ROCURONIUM BROMIDE 10 MG/ML (PF) SYRINGE
PREFILLED_SYRINGE | INTRAVENOUS | Status: AC
  Filled 2022-07-23: qty 10

## 2022-07-23 MED ORDER — AZATHIOPRINE 50 MG PO TABS
75.0000 mg | ORAL_TABLET | Freq: Every day | ORAL | Status: DC
  Administered 2022-07-24: 75 mg via ORAL
  Filled 2022-07-23: qty 2

## 2022-07-23 MED ORDER — THROMBIN 5000 UNITS EX SOLR
CUTANEOUS | Status: AC
  Filled 2022-07-23: qty 5000

## 2022-07-23 MED ORDER — MENTHOL 3 MG MT LOZG
1.0000 | LOZENGE | OROMUCOSAL | Status: DC | PRN
Start: 2022-07-23 — End: 2022-07-24
  Administered 2022-07-23: 3 mg via ORAL
  Filled 2022-07-23: qty 9

## 2022-07-23 MED ORDER — THROMBIN (RECOMBINANT) 5000 UNITS EX SOLR
CUTANEOUS | Status: DC | PRN
  Administered 2022-07-23: 10 mL via TOPICAL

## 2022-07-23 MED ORDER — CEFAZOLIN SODIUM-DEXTROSE 1-4 GM/50ML-% IV SOLN
1.0000 g | Freq: Three times a day (TID) | INTRAVENOUS | Status: DC
  Administered 2022-07-23 – 2022-07-24 (×2): 1 g via INTRAVENOUS
  Filled 2022-07-23 (×2): qty 50

## 2022-07-23 MED ORDER — ONDANSETRON HCL 4 MG/2ML IJ SOLN
INTRAMUSCULAR | Status: DC | PRN
  Administered 2022-07-23: 4 mg via INTRAVENOUS

## 2022-07-23 MED ORDER — OXYCODONE HCL 5 MG PO TABS: 5.0000 mg | ORAL_TABLET | Freq: Once | ORAL | Status: DC | PRN

## 2022-07-23 MED ORDER — FENTANYL CITRATE (PF) 250 MCG/5ML IJ SOLN
INTRAMUSCULAR | Status: DC | PRN
  Administered 2022-07-23 (×5): 50 ug via INTRAVENOUS
  Administered 2022-07-23: 150 ug via INTRAVENOUS

## 2022-07-23 MED ORDER — THROMBIN 5000 UNITS EX SOLR
CUTANEOUS | Status: AC
  Filled 2022-07-23: qty 15000

## 2022-07-23 MED ORDER — FENTANYL CITRATE (PF) 100 MCG/2ML IJ SOLN
25.0000 ug | INTRAMUSCULAR | Status: DC | PRN
  Administered 2022-07-23 (×3): 50 ug via INTRAVENOUS

## 2022-07-23 MED ORDER — ACETAMINOPHEN 10 MG/ML IV SOLN
INTRAVENOUS | Status: AC
  Filled 2022-07-23: qty 100

## 2022-07-23 MED ORDER — PHENOL 1.4 % MT LIQD: 1.0000 | OROMUCOSAL | Status: DC | PRN

## 2022-07-23 MED ORDER — FENTANYL CITRATE (PF) 250 MCG/5ML IJ SOLN
INTRAMUSCULAR | Status: AC
  Filled 2022-07-23: qty 5

## 2022-07-23 MED ORDER — CHLORHEXIDINE GLUCONATE 0.12 % MT SOLN: 15.0000 mL | Freq: Once | OROMUCOSAL | Status: DC

## 2022-07-23 MED ORDER — FENTANYL CITRATE (PF) 100 MCG/2ML IJ SOLN: 25.0000 ug | INTRAMUSCULAR | Status: DC | PRN

## 2022-07-23 MED ORDER — ACETAMINOPHEN 650 MG RE SUPP: 650.0000 mg | RECTAL | Status: DC | PRN

## 2022-07-23 MED ORDER — LIDOCAINE-EPINEPHRINE 1 %-1:100000 IJ SOLN
INTRAMUSCULAR | Status: DC | PRN
  Administered 2022-07-23: 4 mL

## 2022-07-23 MED ORDER — ONDANSETRON HCL 4 MG PO TABS
4.0000 mg | ORAL_TABLET | Freq: Four times a day (QID) | ORAL | Status: DC | PRN
Start: 2022-07-23 — End: 2022-07-24

## 2022-07-23 MED ORDER — FLEET ENEMA 7-19 GM/118ML RE ENEM: 1.0000 | ENEMA | Freq: Once | RECTAL | Status: DC | PRN

## 2022-07-23 MED ORDER — LACTATED RINGERS IV SOLN
INTRAVENOUS | Status: DC
Start: 2022-07-23 — End: 2022-07-23

## 2022-07-23 MED ORDER — GABAPENTIN 300 MG PO CAPS: 300.0000 mg | ORAL_CAPSULE | Freq: Every day | ORAL | Status: DC | PRN

## 2022-07-23 MED ORDER — CYCLOBENZAPRINE HCL 10 MG PO TABS
10.0000 mg | ORAL_TABLET | Freq: Three times a day (TID) | ORAL | Status: DC | PRN
  Administered 2022-07-23 – 2022-07-24 (×2): 10 mg via ORAL
  Filled 2022-07-23 (×2): qty 1

## 2022-07-23 MED ORDER — POTASSIUM CHLORIDE IN NACL 20-0.9 MEQ/L-% IV SOLN
INTRAVENOUS | Status: DC
Start: 2022-07-23 — End: 2022-07-24
  Filled 2022-07-23: qty 1000

## 2022-07-23 MED ORDER — PROPOFOL 10 MG/ML IV BOLUS
INTRAVENOUS | Status: AC
  Filled 2022-07-23: qty 20

## 2022-07-23 MED ORDER — LACTATED RINGERS IV SOLN: INTRAVENOUS | Status: DC

## 2022-07-23 MED ORDER — 0.9 % SODIUM CHLORIDE (POUR BTL) OPTIME
TOPICAL | Status: DC | PRN
  Administered 2022-07-23: 1000 mL

## 2022-07-23 MED ORDER — ACETAMINOPHEN 10 MG/ML IV SOLN
INTRAVENOUS | Status: DC | PRN
  Administered 2022-07-23: 1000 mg via INTRAVENOUS

## 2022-07-23 MED ORDER — LIDOCAINE-EPINEPHRINE 1 %-1:100000 IJ SOLN
INTRAMUSCULAR | Status: AC
  Filled 2022-07-23: qty 1

## 2022-07-23 MED ORDER — ONDANSETRON HCL 4 MG/2ML IJ SOLN
INTRAMUSCULAR | Status: AC
  Filled 2022-07-23: qty 2

## 2022-07-23 MED ORDER — BUPIVACAINE HCL (PF) 0.5 % IJ SOLN
INTRAMUSCULAR | Status: AC
  Filled 2022-07-23: qty 30

## 2022-07-23 MED ORDER — SUGAMMADEX SODIUM 200 MG/2ML IV SOLN
INTRAVENOUS | Status: DC | PRN
  Administered 2022-07-23: 200 mg via INTRAVENOUS

## 2022-07-23 MED ORDER — OXYCODONE HCL 5 MG PO TABS
10.0000 mg | ORAL_TABLET | ORAL | Status: DC | PRN
  Administered 2022-07-23 – 2022-07-24 (×5): 10 mg via ORAL
  Filled 2022-07-23 (×5): qty 2

## 2022-07-23 MED ORDER — SODIUM CHLORIDE 0.9% FLUSH
3.0000 mL | Freq: Two times a day (BID) | INTRAVENOUS | Status: DC
  Administered 2022-07-24: 3 mL via INTRAVENOUS

## 2022-07-23 MED ORDER — ACETAMINOPHEN 160 MG/5ML PO SOLN: 1000.0000 mg | Freq: Once | ORAL | Status: DC | PRN

## 2022-07-23 MED ORDER — SODIUM CHLORIDE 0.9 % IV SOLN: 250.0000 mL | INTRAVENOUS | Status: DC

## 2022-07-23 MED ORDER — DEXAMETHASONE SODIUM PHOSPHATE 10 MG/ML IJ SOLN
INTRAMUSCULAR | Status: DC | PRN
  Administered 2022-07-23: 10 mg via INTRAVENOUS

## 2022-07-23 MED ORDER — THROMBIN 5000 UNITS EX SOLR
OROMUCOSAL | Status: DC | PRN
  Administered 2022-07-23 (×2): 5 mL via TOPICAL

## 2022-07-23 MED ORDER — ACETAMINOPHEN 325 MG PO TABS
650.0000 mg | ORAL_TABLET | ORAL | Status: DC | PRN
  Administered 2022-07-23 – 2022-07-24 (×2): 650 mg via ORAL
  Filled 2022-07-23 (×2): qty 2

## 2022-07-23 MED ORDER — DEXAMETHASONE SODIUM PHOSPHATE 10 MG/ML IJ SOLN
INTRAMUSCULAR | Status: AC
  Filled 2022-07-23: qty 1

## 2022-07-23 MED ORDER — ACETAMINOPHEN 10 MG/ML IV SOLN: 1000.0000 mg | Freq: Once | INTRAVENOUS | Status: DC | PRN

## 2022-07-23 MED ORDER — CHLORHEXIDINE GLUCONATE 0.12 % MT SOLN
OROMUCOSAL | Status: AC
  Filled 2022-07-23: qty 15

## 2022-07-23 MED ORDER — CEFAZOLIN SODIUM-DEXTROSE 2-4 GM/100ML-% IV SOLN
2.0000 g | INTRAVENOUS | Status: AC
  Administered 2022-07-23 (×2): 2 g via INTRAVENOUS

## 2022-07-23 MED ORDER — LIDOCAINE 2% (20 MG/ML) 5 ML SYRINGE
INTRAMUSCULAR | Status: DC | PRN
  Administered 2022-07-23: 60 mg via INTRAVENOUS

## 2022-07-23 MED ORDER — LIDOCAINE 2% (20 MG/ML) 5 ML SYRINGE
INTRAMUSCULAR | Status: AC
  Filled 2022-07-23: qty 5

## 2022-07-23 MED ORDER — OXYCODONE HCL 5 MG/5ML PO SOLN: 5.0000 mg | Freq: Once | ORAL | Status: DC | PRN

## 2022-07-23 MED ORDER — ORAL CARE MOUTH RINSE: 15.0000 mL | Freq: Once | OROMUCOSAL | Status: DC

## 2022-07-23 MED ORDER — ONDANSETRON HCL 4 MG/2ML IJ SOLN
4.0000 mg | Freq: Four times a day (QID) | INTRAMUSCULAR | Status: DC | PRN
Start: 2022-07-23 — End: 2022-07-24

## 2022-07-23 MED ORDER — EPHEDRINE 5 MG/ML INJ
INTRAVENOUS | Status: AC
  Filled 2022-07-23: qty 5

## 2022-07-23 MED ORDER — LEVOTHYROXINE SODIUM 100 MCG PO TABS
200.0000 ug | ORAL_TABLET | Freq: Every day | ORAL | Status: DC
  Administered 2022-07-24: 200 ug via ORAL
  Filled 2022-07-23: qty 2

## 2022-07-23 MED ORDER — BUPIVACAINE HCL 0.5 % IJ SOLN
INTRAMUSCULAR | Status: DC | PRN
  Administered 2022-07-23: 4 mL

## 2022-07-23 MED ORDER — ACETAMINOPHEN 500 MG PO TABS: 1000.0000 mg | ORAL_TABLET | Freq: Once | ORAL | Status: DC | PRN

## 2022-07-23 MED ORDER — OXYCODONE HCL 5 MG PO TABS: 5.0000 mg | ORAL_TABLET | ORAL | Status: DC | PRN

## 2022-07-23 MED ORDER — DOCUSATE SODIUM 100 MG PO CAPS
100.0000 mg | ORAL_CAPSULE | Freq: Two times a day (BID) | ORAL | Status: DC
  Administered 2022-07-23 – 2022-07-24 (×2): 100 mg via ORAL
  Filled 2022-07-23 (×2): qty 1

## 2022-07-23 SURGICAL SUPPLY — 79 items
APL SKNCLS STERI-STRIP NONHPOA (GAUZE/BANDAGES/DRESSINGS) ×1
BAG COUNTER SPONGE SURGICOUNT (BAG) ×2 IMPLANT
BAG SPNG CNTER NS LX DISP (BAG) ×1
BAND INSRT 18 STRL LF DISP RB (MISCELLANEOUS) ×2
BAND RUBBER #18 3X1/16 STRL (MISCELLANEOUS) ×4 IMPLANT
BASKET BONE COLLECTION (BASKET) IMPLANT
BENZOIN TINCTURE PRP APPL 2/3 (GAUZE/BANDAGES/DRESSINGS) ×2 IMPLANT
BIT DRILL NEURO 2X3.1 SFT TUCH (MISCELLANEOUS) ×1 IMPLANT
BIT DRILL OZARK SU 2.3X16 (DRILL) IMPLANT
BLADE CLIPPER SURG (BLADE) IMPLANT
BLADE SURG 15 STRL LF DISP TIS (BLADE) IMPLANT
BLADE SURG 15 STRL SS (BLADE)
BLADE ULTRA TIP 2M (BLADE) IMPLANT
BUR MATCHSTICK NEURO 3.0 LAGG (BURR) ×2 IMPLANT
CANISTER SUCT 3000ML PPV (MISCELLANEOUS) ×2 IMPLANT
DRAPE C-ARM 42X72 X-RAY (DRAPES) ×4 IMPLANT
DRAPE HALF SHEET 40X57 (DRAPES) IMPLANT
DRAPE LAPAROTOMY 100X72 PEDS (DRAPES) ×2 IMPLANT
DRAPE MICROSCOPE LEICA (MISCELLANEOUS) ×2 IMPLANT
DRILL NEURO 2X3.1 SOFT TOUCH (MISCELLANEOUS) ×2
DRILL OZARK SU 2.3X16 (DRILL) ×2
DRSG MEPILEX BORDER 4X4 (GAUZE/BANDAGES/DRESSINGS) ×2 IMPLANT
DRSG OPSITE 4X5.5 SM (GAUZE/BANDAGES/DRESSINGS) ×4 IMPLANT
DRSG OPSITE POSTOP 3X4 (GAUZE/BANDAGES/DRESSINGS) ×1 IMPLANT
DRSG OPSITE POSTOP 4X6 (GAUZE/BANDAGES/DRESSINGS) ×1 IMPLANT
DURAPREP 26ML APPLICATOR (WOUND CARE) ×2 IMPLANT
ELECT COATED BLADE 2.86 ST (ELECTRODE) ×2 IMPLANT
ELECT REM PT RETURN 9FT ADLT (ELECTROSURGICAL) ×2
ELECTRODE REM PT RTRN 9FT ADLT (ELECTROSURGICAL) ×1 IMPLANT
EVACUATOR 1/8 PVC DRAIN (DRAIN) ×1 IMPLANT
GAUZE 4X4 16PLY ~~LOC~~+RFID DBL (SPONGE) IMPLANT
GAUZE SPONGE 4X4 12PLY STRL LF (GAUZE/BANDAGES/DRESSINGS) ×1 IMPLANT
GLOVE BIOGEL PI IND STRL 7.0 (GLOVE) IMPLANT
GLOVE BIOGEL PI IND STRL 7.5 (GLOVE) ×1 IMPLANT
GLOVE BIOGEL PI INDICATOR 7.0 (GLOVE) ×1
GLOVE BIOGEL PI INDICATOR 7.5 (GLOVE) ×2
GLOVE ECLIPSE 7.5 STRL STRAW (GLOVE) ×3 IMPLANT
GLOVE SURG ENC MOIS LTX SZ8 (GLOVE) ×2 IMPLANT
GLOVE SURG SS PI 6.5 STRL IVOR (GLOVE) ×7 IMPLANT
GLOVE SURG UNDER POLY LF SZ8.5 (GLOVE) ×2 IMPLANT
GOWN STRL REUS W/ TWL LRG LVL3 (GOWN DISPOSABLE) ×2 IMPLANT
GOWN STRL REUS W/ TWL XL LVL3 (GOWN DISPOSABLE) ×1 IMPLANT
GOWN STRL REUS W/TWL 2XL LVL3 (GOWN DISPOSABLE) IMPLANT
GOWN STRL REUS W/TWL LRG LVL3 (GOWN DISPOSABLE) ×4
GOWN STRL REUS W/TWL XL LVL3 (GOWN DISPOSABLE) ×2
HEMOSTAT POWDER KIT SURGIFOAM (HEMOSTASIS) ×3 IMPLANT
KIT BASIN OR (CUSTOM PROCEDURE TRAY) ×2 IMPLANT
KIT TURNOVER KIT B (KITS) ×2 IMPLANT
MARKER SKIN DUAL TIP RULER LAB (MISCELLANEOUS) ×1 IMPLANT
NDL SPNL 22GX3.5 QUINCKE BK (NEEDLE) ×1 IMPLANT
NEEDLE HYPO 22GX1.5 SAFETY (NEEDLE) ×2 IMPLANT
NEEDLE SPNL 22GX3.5 QUINCKE BK (NEEDLE) ×2 IMPLANT
NS IRRIG 1000ML POUR BTL (IV SOLUTION) ×2 IMPLANT
PACK LAMINECTOMY NEURO (CUSTOM PROCEDURE TRAY) ×2 IMPLANT
PAD ARMBOARD 7.5X6 YLW CONV (MISCELLANEOUS) ×6 IMPLANT
PIN DISTRACTION 14MM (PIN) ×4 IMPLANT
PLATE CERV CONS OZARK 3X63 (Plate) ×1 IMPLANT
PUTTY BONE 100 VESUVIUS 2.5CC (Putty) ×1 IMPLANT
SCREW CERV ST OZARK 4X18 (Screw) ×4 IMPLANT
SCREW CERV VA OZARK 4.5X18 (Screw) ×1 IMPLANT
SCREW VA ST OZARK 4X16 (Plate) ×5 IMPLANT
SPACER ANGLD CASCAD 16X13X7 7D (Spacer) ×2 IMPLANT
SPACER LORD CASCAD 13X16X6 7D (Spacer) ×1 IMPLANT
SPIKE FLUID TRANSFER (MISCELLANEOUS) ×2 IMPLANT
SPONGE INTESTINAL PEANUT (DISPOSABLE) ×3 IMPLANT
SPONGE SURGIFOAM ABS GEL SZ50 (HEMOSTASIS) ×2 IMPLANT
STAPLER VISISTAT 35W (STAPLE) IMPLANT
STRIP CLOSURE SKIN 1/2X4 (GAUZE/BANDAGES/DRESSINGS) ×2 IMPLANT
SUT MNCRL AB 4-0 PS2 18 (SUTURE) ×2 IMPLANT
SUT SILK 2 0 TIES 10X30 (SUTURE) ×1 IMPLANT
SUT VIC AB 0 CT1 27 (SUTURE)
SUT VIC AB 0 CT1 27XBRD ANTBC (SUTURE) IMPLANT
SUT VIC AB 2-0 CP2 18 (SUTURE) IMPLANT
SUT VIC AB 3-0 SH 8-18 (SUTURE) ×2 IMPLANT
TAP 10 (MISCELLANEOUS) ×1 IMPLANT
TAPE CLOTH 3X10 TAN LF (GAUZE/BANDAGES/DRESSINGS) ×2 IMPLANT
TOWEL GREEN STERILE (TOWEL DISPOSABLE) ×2 IMPLANT
TOWEL GREEN STERILE FF (TOWEL DISPOSABLE) ×2 IMPLANT
WATER STERILE IRR 1000ML POUR (IV SOLUTION) ×2 IMPLANT

## 2022-07-23 NOTE — Anesthesia Preprocedure Evaluation (Signed)
Anesthesia Evaluation  Patient identified by MRN, date of birth, ID band Patient awake    Reviewed: Allergy & Precautions, NPO status , Patient's Chart, lab work & pertinent test results  History of Anesthesia Complications Negative for: history of anesthetic complications  Airway Mallampati: IV  TM Distance: <3 FB Neck ROM: Full    Dental  (+) Partial Upper, Partial Lower,    Pulmonary neg shortness of breath, neg sleep apnea, neg COPD, neg recent URI, former smoker,    breath sounds clear to auscultation       Cardiovascular + Valvular Problems/Murmurs MR  Rhythm:Regular + Systolic murmurs 1. Left ventricular ejection fraction, by estimation, is 55 to 60%. The  left ventricle has normal function. The left ventricle has no regional  wall motion abnormalities. There is mild concentric left ventricular  hypertrophy. Left ventricular diastolic  parameters are consistent with Grade II diastolic dysfunction  (pseudonormalization). Elevated left ventricular end-diastolic pressure.  2. Right ventricular systolic function is normal. The right ventricular  size is normal. There is normal pulmonary artery systolic pressure.  3. The mitral valve is rheumatic. Moderate mitral valve regurgitation. No  evidence of mitral stenosis.  4. The aortic valve is tricuspid. Aortic valve regurgitation is not  visualized. No aortic stenosis is present.  5. The inferior vena cava is normal in size with greater than 50%  respiratory variability, suggesting right atrial pressure of 3 mmHg.    Neuro/Psych  Neuromuscular disease negative psych ROS   GI/Hepatic negative GI ROS, (+) Hepatitis -, AutoimmuneLab Results      Component                Value               Date                      ALT                      15                  07/19/2022                AST                      23                  07/19/2022                ALKPHOS                   50                  07/19/2022                BILITOT                  0.8                 07/19/2022              Endo/Other  Hypothyroidism   Renal/GU negative Renal ROS     Musculoskeletal  (+) Arthritis ,   Abdominal   Peds  Hematology negative hematology ROS (+) Lab Results      Component                Value  Date                      WBC                      5.4                 07/19/2022                HGB                      14.1                07/19/2022                HCT                      40.8                07/19/2022                MCV                      94.0                07/19/2022                PLT                      163                 07/19/2022              Anesthesia Other Findings   Reproductive/Obstetrics                             Anesthesia Physical Anesthesia Plan  ASA: 2  Anesthesia Plan: General   Post-op Pain Management: Ofirmev IV (intra-op)*   Induction: Intravenous  PONV Risk Score and Plan: 2 and Ondansetron and Dexamethasone  Airway Management Planned: Video Laryngoscope Planned  Additional Equipment: None  Intra-op Plan:   Post-operative Plan: Extubation in OR  Informed Consent: I have reviewed the patients History and Physical, chart, labs and discussed the procedure including the risks, benefits and alternatives for the proposed anesthesia with the patient or authorized representative who has indicated his/her understanding and acceptance.     Dental advisory given  Plan Discussed with: CRNA  Anesthesia Plan Comments:         Anesthesia Quick Evaluation

## 2022-07-23 NOTE — Anesthesia Procedure Notes (Signed)
Procedure Name: Intubation Date/Time: 07/23/2022 8:27 AM  Performed by: Lance Coon, CRNAPre-anesthesia Checklist: Emergency Drugs available, Patient identified, Suction available, Patient being monitored and Timeout performed Patient Re-evaluated:Patient Re-evaluated prior to induction Oxygen Delivery Method: Circle system utilized Preoxygenation: Pre-oxygenation with 100% oxygen Induction Type: IV induction Ventilation: Mask ventilation without difficulty Laryngoscope Size: Glidescope and 4 Grade View: Grade I Tube type: Oral Tube size: 7.5 mm Number of attempts: 1 Airway Equipment and Method: Stylet and Video-laryngoscopy Placement Confirmation: ETT inserted through vocal cords under direct vision, positive ETCO2 and breath sounds checked- equal and bilateral Secured at: 23 cm Tube secured with: Tape Dental Injury: Teeth and Oropharynx as per pre-operative assessment

## 2022-07-23 NOTE — H&P (Signed)
CC: arm weakness  HPI:     Patient is a 66 y.o. male presents with right arm weakness and numbness with muscle bulk loss.  He was found to have severe cervical stenosis with myelomalacia.  Nonsurgical therapies failed to improve his symptoms.    Patient Active Problem List   Diagnosis Date Noted   Gait abnormality 06/18/2022   Cervical myelopathy ([place]) 06/17/2022   Weakness 06/06/2022   Neck pain 06/06/2022   OA (osteoarthritis) of hip 07/24/2021   Immunosuppressed status ([place]) 09/05/2014   Hepatitis, autoimmune ([place]) 09/05/2014   Past Medical History:  Diagnosis Date   Arthritis    BPH (benign prostatic hyperplasia)    Colon polyp    ED (erectile dysfunction)    Elevated liver enzymes    Gallstones    Hepatitis    History of anemia    Hypercholesterolemia    Hypothyroidism    Mild tricuspid regurgitation    Mitral valve regurgitation    Thyroid disease    Vertigo     Past Surgical History:  Procedure Laterality Date   FLEXIBLE SIGMOIDOSCOPY      Facility-Administered Medications Prior to Admission  Medication Dose Route Frequency Provider Last Rate Last Admin   acetaminophen (TYLENOL) tablet 975 mg  975 mg Oral Q4H PRN Copland, Gay Filler, MD       Medications Prior to Admission  Medication Sig Dispense Refill Last Dose   azaTHIOprine (IMURAN) 50 MG tablet Take 75 mg by mouth daily.   07/23/2022 at 0500   gabapentin (NEURONTIN) 300 MG capsule Take 1 capsule (300 mg total) by mouth 3 (three) times daily. (Patient taking differently: Take 300 mg by mouth daily as needed (pain).) 90 capsule 11 07/21/2022   levothyroxine (SYNTHROID) 200 MCG tablet Take 200 mcg by mouth daily before breakfast.   07/23/2022 at 0500   sildenafil (VIAGRA) 100 MG tablet Take 100 mg by mouth as needed for erectile dysfunction.   07/20/2022   tadalafil (CIALIS) 5 MG tablet Take 5 mg by mouth daily as needed (BPH). (Patient not taking: Reported on 07/12/2022)   Not Taking   Allergies  Allergen  Reactions   Tadalafil Other (See Comments)    Headache     Social History   Tobacco Use   Smoking status: Former   Smokeless tobacco: Never  Substance Use Topics   Alcohol use: Yes    Comment: occ    Family History  Problem Relation Age of Onset   Cancer Mother        pancreatic   Cancer Father        stomach   Kidney failure Brother    Other Brother        neck tumor   Hypertension Brother    Diabetes Brother    Lupus Daughter    Hypertension Sister    Other Son        boating accident     Review of Systems Pertinent items are noted in HPI.  Objective:   Patient Vitals for the past 8 hrs:  BP Temp Pulse Resp SpO2 Height Weight  07/23/22 0658 123/86 -- (!) 54 -- -- -- --  07/23/22 0616 (!) 148/96 -- 61 -- -- -- --  07/23/22 0555 (!) 145/99 (!) 97.5 F (36.4 C) (!) 55 17 98 % '6\' 3"'$  (1.905 m) 108.9 kg   No intake/output data recorded. No intake/output data recorded.     General : Alert, cooperative, no distress, appears stated age  Head:  Normocephalic/atraumatic    Eyes: PERRL, conjunctiva/corneas clear, EOM's intact. Fundi could not be visualized Neck: Supple Chest:  Respirations unlabored Chest wall: no tenderness or deformity Heart: Regular rate and rhythm Abdomen: Soft, nontender and nondistended Extremities: warm and well-perfused Skin: normal turgor, color and texture Neurologic:  Alert, oriented x 3.  Eyes open spontaneously. PERRL, EOMI, VFC, no facial droop. V1-3 intact.  No dysarthria, tongue protrusion symmetric.  CNII-XII intact. R biceps wasting, 4/5 strength, + Spurling's bilaterally.  No pronator drift, full strength in legs       Data ReviewCBC:  Lab Results  Component Value Date   WBC 5.4 07/19/2022   RBC 4.34 07/19/2022   BMP:  Lab Results  Component Value Date   GLUCOSE 94 07/19/2022   CO2 27 07/19/2022   BUN 12 07/19/2022   CREATININE 1.35 (H) 07/19/2022   CALCIUM 9.4 07/19/2022   Radiology review:  See clinic  note  Assessment:   Active Problems:   * No active hospital problems. *  Severe cervical stenosis  Plan:   - 3 lvl ACDF today

## 2022-07-23 NOTE — Transfer of Care (Signed)
Immediate Anesthesia Transfer of Care Note  Patient: Alex West  Procedure(s) Performed: Anterior Cervical Discectomy and Fusion Cervical Three-Four/Four-Five/Five-Six  Patient Location: PACU  Anesthesia Type:General  Level of Consciousness: drowsy and patient cooperative  Airway & Oxygen Therapy: Patient Spontanous Breathing  Post-op Assessment: Report given to RN and Post -op Vital signs reviewed and stable  Post vital signs: Reviewed and stable  Last Vitals:  Vitals Value Taken Time  BP 174/88 07/23/22 1308  Temp    Pulse 71 07/23/22 1309  Resp 19 07/23/22 1309  SpO2 96 % 07/23/22 1309  Vitals shown include unvalidated device data.  Last Pain:  Vitals:   07/23/22 0624  PainSc: 4       Patients Stated Pain Goal: 0 (98/92/11 9417)  Complications: No notable events documented.

## 2022-07-23 NOTE — Op Note (Signed)
PREOP DIAGNOSIS: Cervical spondylitic myelopathy  POSTOP DIAGNOSIS: Cervical spondylitic myelopathy  PROCEDURE: 1. Arthrodesis C3-4, anterior interbody technique, including Discectomy for decompression of spinal cord and exiting nerve roots with foraminotomies  2. Arthrodesis, additional level C4-5 anterior interbody technique, including Discectomy for decompression of spinal cord and exiting nerve roots with foraminotomies  3. Arthrodesis, additional level C5-6 anterior interbody technique, including Discectomy for decompression of spinal cord and exiting nerve roots with foraminotomies  4. Placement of intervertebral biomechanical device C3-4 5. Placement of intervertebral biomechanical device C4-5 6. Placement of intervertebral biomechanical device C5-6 7. Placement of anterior instrumentation consisting of interbody plate and screws [id number] 8. Use of morselized bone allograft  9. Use of intraoperative microscope  SURGEON: Dr. Duffy Rhody, MD  ASSISTANT:  Weston Brass, NP.  Please note there were no qualified trainees available to assist with the procedure.  Assistance required for retraction of the visceral structures to allow for safe instrumentation.  ANESTHESIA: General Endotracheal  EBL: 75 ml  IMPLANTS: Stryker [place] C cage 6 mm cage at C3-4, 7 mm cage at C4-5, 7 mm cage at C5-6 4.0 x 18 mm screws at C3 and C6, 4.0 x 16 mm screws at C4 and C5 63 mm Ozark plate [place] DBM  SPECIMENS: None  DRAINS: None  COMPLICATIONS: None immediate  CONDITION: Hemodynamically stable to PACU  HISTORY: This is a 66 yo M who had progressive cervical myeloradiculopathy with weakness in his right arm.  He had severe cervical stenosis including myelomalacia and severe stenosis at C3-4, moderate-to-severe stenosis at C4-5, and severe stenosis at C5-6.  Option of C3-6 ACDF was discussed.   Risks, benefits, alternatives, and expected convalescence were discussed with the patient.   Risks discussed included but were not limited to bleeding, pain, infection, dysphagia, dysphonia, pseudoarthrosis, hardware failure, adjacent segment disease, CSF leak, neurologic deficits, weakness, numbness, paralysis, coma, and death. After all questions were answered, informed consent was obtained.  PROCEDURE IN DETAIL: The patient was brought to the operating room and transferred to the operative table. After induction of general anesthesia, the patient was positioned on the operative table in the supine position with all pressure points meticulously padded. The skin of the neck was then prepped and draped in the usual sterile fashion.  After timeout was conducted, the skin was infiltrated with local anesthetic. Skin incision was then made sharply and Bovie electrocautery was used to dissect the subcutaneous tissue until the platysma was identified. The platysma was then divided and undermined. The sternocleidomastoid muscle was then identified and, utilizing natural fascial planes in the neck, the prevertebral fascia was identified and the carotid sheath was retracted laterally and the trachea and esophagus retracted medially. Again using fluoroscopy, the C3-4 disc space was identified. Bovie electrocautery was used to dissect in the subperiosteal plane and elevate the bilateral longus coli muscles. Self-retaining retractors were then placed. Caspar distraction pins were placed in the adjacent bodies to allow for gentle distraction.  At this point, the microscope was draped and brought into the field, and the remainder of the case was done under the microscope using microdissecting technique.  The disc space was incised sharply and combination of high speed drill, curettes, and rongeurs were use to initially complete a discectomy. The high-speed drill was then used to complete discectomy until the posterior annulus was identified and removed and the posterior longitudinal ligament was identified. Using  a nerve hook, the PLL was elevated, and Kerrison rongeurs were used to remove the posterior longitudinal ligament  and the ventral thecal sac was identified.  Using a combination of curettes and rongeurs, complete decompression of the thecal sac and exiting nerve roots at this level was completed, and verified with easy passage of micro-nerve hook centrally and in the bilateral foramina.  Having completed our decompression, attention was turned to placement of the intervertebral device. Trial spacers were used to select a size 6 mm graft. This graft was then filled with morcellized allograft, and inserted under live fluoroscopy.  Attention was then turned to the C4-5 level. Caspar distraction pin was placed in the adjacent body to allow for gentle distraction of the disc space.  In a similar fashion, discectomy was completed initially with curettes and rongeurs, and completed with the drill. The PLL was again identified, elevated and incised. Using Kerrison rongeurs, decompression of the spinal cord and exiting roots was completed and confirmed with a dissector. Trial spacers were used to select a 7 mm graft. This graft was then filled with morcellized allograft, and inserted under live fluoroscopy.  Attention was then turned to the C5-6 level. Caspar distraction pin was placed in the adjacent body to allow for gentle distraction of the disc space.  In a similar fashion, discectomy was completed initially with curettes and rongeurs, and completed with the drill. The PLL was again identified, elevated and incised. Using Kerrison rongeurs, decompression of the spinal cord and exiting roots was completed and confirmed with a dissector. Trial spacers were used to select a 7 mm graft. This graft was then filled with morcellized allograft, and inserted under live fluoroscopy.  After placement of the intervertebral devices, the caspar pins were removed.  An anterior cervical plate was placed across the interspaces for  anterior fixation.  Using a high-speed drill, the cortex of the cervical vertebral bodies was punctured, and screws inserted in the vertebral bodies. Final fluoroscopic images in AP and lateral projections were taken to confirm good hardware placement.  At this point, after all counts were verified to be correct, meticulous hemostasis was secured using a combination of bipolar electrocautery and passive hemostatics.  A medium Hemovac drain was placed in the deep cervical space and tunneled out the skin and secured with a stitch.  The platysma muscle was then closed using interrupted 3-0 Vicryl sutures, and the skin was closed with a 4-0 monocryl in subcuticular fashion followed by steri-strips. Sterile dressings were then applied and the drapes removed.  The patient tolerated the procedure well and was extubated in the room and taken to the postanesthesia care unit in stable condition.  All counts were correct at the end of the procedure.

## 2022-07-23 NOTE — Progress Notes (Signed)
Orthopedic Tech Progress Note Patient Details:  Alex West 02/28/1956 [id number]  OR RN called requesting an [facility], called in order to [facility] for an [place]  Patient ID: Alex West, male   DOB: 1956/10/25, 66 y.o.   MRN: [id number]  Alex West 07/23/2022, 8:26 AM

## 2022-07-24 DIAGNOSIS — E039 Hypothyroidism, unspecified: Secondary | ICD-10-CM | POA: Diagnosis not present

## 2022-07-24 DIAGNOSIS — M4802 Spinal stenosis, cervical region: Secondary | ICD-10-CM | POA: Diagnosis not present

## 2022-07-24 DIAGNOSIS — Z79899 Other long term (current) drug therapy: Secondary | ICD-10-CM | POA: Diagnosis not present

## 2022-07-24 DIAGNOSIS — Z87891 Personal history of nicotine dependence: Secondary | ICD-10-CM | POA: Diagnosis not present

## 2022-07-24 DIAGNOSIS — M4712 Other spondylosis with myelopathy, cervical region: Secondary | ICD-10-CM | POA: Diagnosis not present

## 2022-07-24 MED ORDER — OXYCODONE HCL 5 MG PO TABS: 5.0000 mg | ORAL_TABLET | ORAL | 0 refills | Status: DC | PRN

## 2022-07-24 MED ORDER — METHOCARBAMOL 750 MG PO TABS: 750.0000 mg | ORAL_TABLET | Freq: Three times a day (TID) | ORAL | 2 refills | Status: DC | PRN

## 2022-07-24 MED FILL — Thrombin For Soln 5000 Unit: CUTANEOUS | Qty: 2 | Status: AC

## 2022-07-24 MED FILL — Thrombin For Soln 5000 Unit: CUTANEOUS | Qty: 5000 | Status: AC

## 2022-07-24 NOTE — Discharge Summary (Signed)
Physician Discharge Summary  Patient ID: Alex West MRN: [id number] DOB/AGE: 01/20/1956 66 y.o.  Admit date: 07/23/2022 Discharge date: 07/24/2022  Admission Diagnoses: Cervical spondylitic myelopathy  Discharge Diagnoses: Cervical spondylitic myelopathy Principal Problem:   Stenosis of cervical spine with myelopathy [facility]   Discharged Condition: good  Hospital Course: The patient was admitted on 07/23/2022 and taken to the operating room where the patient underwent ACDF C3/4, C4.5, and C5/6. The patient tolerated the procedure well and was taken to the recovery room and then to the floor in stable condition. The hospital course was routine. There were no complications. The wound remained clean dry and intact. Pt had appropriate upper back/neck soreness. No complaints of arm pain or new N/T/W. The patient remained afebrile with stable vital signs, and tolerated a regular diet. The patient continued to increase activities, and pain was well controlled with oral pain medications.   Consults: None  Significant Diagnostic Studies: radiology: X-Ray: intraoperative   Treatments: surgery:  1. Arthrodesis C3-4, anterior interbody technique, including Discectomy for decompression of spinal cord and exiting nerve roots with foraminotomies  2. Arthrodesis, additional level C4-5 anterior interbody technique, including Discectomy for decompression of spinal cord and exiting nerve roots with foraminotomies  3. Arthrodesis, additional level C5-6 anterior interbody technique, including Discectomy for decompression of spinal cord and exiting nerve roots with foraminotomies  4. Placement of intervertebral biomechanical device C3-4 5. Placement of intervertebral biomechanical device C4-5 6. Placement of intervertebral biomechanical device C5-6 7. Placement of anterior instrumentation consisting of interbody plate and screws Y4-0-3-4 8. Use of morselized bone allograft  9. Use of intraoperative  microscope    Discharge Exam: Blood pressure 130/81, pulse (!) 57, temperature 97.8 F (36.6 C), resp. rate 16, height '6\' 3"'$  (1.905 m), weight 108.9 kg, SpO2 99 %. Physical Exam: Patient is awake, A/O X 4, conversant, and in good spirits. Eyes open spontaneously. They are in NAD and VSS. Doing well. Speech is fluent and appropriate. MAEW. BUE 5/5 throughout except 4+/5 left triceps and right hand intrinsics 4+/5. BLE 5/5 throughout. Sensation to light touch is intact. PERLA, EOMI. CNs grossly intact. Dressing is clean dry intact. Incision is well approximated with no drainage, erythema, or edema. Hemovac removed. Hard cervical collar in place    Disposition: Discharge disposition: 01-Home or Self Care       Discharge Instructions     Incentive spirometry RT   Complete by: As directed       Allergies as of 07/24/2022       Reactions   Tadalafil Other (See Comments)   Headache        Medication List     TAKE these medications    azaTHIOprine 50 MG tablet Commonly known as: IMURAN Take 75 mg by mouth daily.   gabapentin 300 MG capsule Commonly known as: NEURONTIN Take 1 capsule (300 mg total) by mouth 3 (three) times daily. What changed:  when to take this reasons to take this   levothyroxine 200 MCG tablet Commonly known as: SYNTHROID Take 200 mcg by mouth daily before breakfast.   methocarbamol 750 MG tablet Commonly known as: Robaxin-750 Take 1 tablet (750 mg total) by mouth every 8 (eight) hours as needed for muscle spasms.   oxyCODONE 5 MG immediate release tablet Commonly known as: Roxicodone Take 1-2 tablets (5-10 mg total) by mouth every 4 (four) hours as needed.   sildenafil 100 MG tablet Commonly known as: VIAGRA Take 100 mg by mouth as needed for erectile dysfunction.  tadalafil 5 MG tablet Commonly known as: CIALIS Take 5 mg by mouth daily as needed (BPH).         Signed: Marvis Moeller, DNP, AGNP-C Neurosurgery Nurse  Practitioner  [facility] [place] [address], [place], [place] [address] P: [phone]    F: [phone]  07/24/2022 9:01 AM

## 2022-07-24 NOTE — Evaluation (Signed)
Physical Therapy Evaluation  Patient Details Name: Alex West MRN: [id number] DOB: [birth year] Today's Date: 07/24/2022  History of Present Illness  Pt is a 66 y.o male s/p ACDF C3-6 on 07/23/2022. PMH significant for gait abnormality, cervical myelopathy, autoimmune hepatitis, hypothyroidism, BPH, arthritis, mitral valve regurgitation, and vertigo.   Clinical Impression  Pt admitted with above diagnosis. At the time of PT eval, pt was able to demonstrate transfers and ambulation with gross min guard assist to min assist and RW for support. Pt was educated on precautions, brace application/wearing schedule, appropriate activity progression, and car transfer. Pt currently with functional limitations due to the deficits listed below (see PT Problem List). Pt will benefit from skilled PT to increase their independence and safety with mobility to allow discharge to the venue listed below.         Recommendations for follow up therapy are one component of a multi-disciplinary discharge planning process, led by the attending physician.  Recommendations may be updated based on patient status, additional functional criteria and insurance authorization.  Follow Up Recommendations Home health PT      Assistance Recommended at Discharge Intermittent Supervision/Assistance  Patient can return home with the following  A little help with walking and/or transfers;A little help with bathing/dressing/bathroom;Assistance with cooking/housework;Assist for transportation;Help with stairs or ramp for entrance    Equipment Recommendations Rolling walker (2 wheels)  Recommendations for Other Services       Functional Status Assessment Patient has had a recent decline in their functional status and demonstrates the ability to make significant improvements in function in a reasonable and predictable amount of time.     Precautions / Restrictions Precautions Precautions: Cervical Precaution Booklet Issued: Yes  (comment) Precaution Comments: All education provided and reinforced with handout Required Braces or Orthoses: Cervical Brace Cervical Brace: Hard collar Restrictions Weight Bearing Restrictions: No      Mobility  Bed Mobility               General bed mobility comments: Pt was received sitting up in the recliner. Verbally reviewed log roll and positioning recommendations in the bed.    Transfers Overall transfer level: Needs assistance Equipment used: Rolling walker (2 wheels) Transfers: Sit to/from Stand Sit to Stand: Min guard           General transfer comment: Light guard for safety as pt powered up to full stand. Increased time to gain/maintain standing balance but no overt LOB noted.    Ambulation/Gait Ambulation/Gait assistance: Min assist Gait Distance (Feet): 300 Feet Assistive device: Rolling walker (2 wheels) Gait Pattern/deviations: Step-through pattern, Decreased stride length, Trunk flexed, Ataxic, Knee flexed in stance - right, Knee flexed in stance - left, Decreased dorsiflexion - right, Decreased dorsiflexion - left Gait velocity: Decreased Gait velocity interpretation: 1.31 - 2.62 ft/sec, indicative of limited community ambulator   General Gait Details: VC's for improved posture, closer walker proximity, and forward gaze. Occasional min assist provided for safety. Pt with ataxic appearing gait, with difficulty achieving good heel strike bilaterally.  Stairs Stairs: Yes Stairs assistance: Min guard Stair Management: One rail Left, Step to pattern, Forwards Number of Stairs: 10 General stair comments: VC's for sequencing and general safety. Hands on guarding provided.  Wheelchair Mobility    Modified Rankin (Stroke Patients Only)       Balance Overall balance assessment: Needs assistance Sitting-balance support: No upper extremity supported, Feet supported Sitting balance-Leahy Scale: Good Sitting balance - Comments: Pt using AE to don LB  dressing  Standing balance support: Bilateral upper extremity supported, During functional activity Standing balance-Leahy Scale: Poor Standing balance comment: Reliant on RW                             Pertinent Vitals/Pain Pain Assessment Pain Assessment: Faces Faces Pain Scale: Hurts a little bit Pain Location: operative site Pain Descriptors / Indicators: Discomfort, Operative site guarding Pain Intervention(s): Limited activity within patient's tolerance, Monitored during session, Repositioned    Home Living Family/patient expects to be discharged to:: Private residence Living Arrangements: Spouse/significant other Available Help at Discharge: Family;Available PRN/intermittently (Initially available 24 hours per day) Type of Home: House Home Access: Stairs to enter Entrance Stairs-Rails: Right Entrance Stairs-Number of Steps: 2   Home Layout: Multi-level;Able to live on main level with bedroom/bathroom Home Equipment: None Additional Comments: Pt reporting he does not have any equipment at home.    Prior Function Prior Level of Function : Independent/Modified Independent;Driving (retired)             Mobility Comments: ambulated without AD however states he was furniture walking and leaning against walls for support. ADLs Comments: Reports he was independent     Hand Dominance        Extremity/Trunk Assessment   Upper Extremity Assessment Upper Extremity Assessment: Defer to OT evaluation RUE Deficits / Details: numbness, weakness RUE Sensation: decreased light touch RUE Coordination: decreased fine motor    Lower Extremity Assessment Lower Extremity Assessment: Generalized weakness;LLE deficits/detail LLE Deficits / Details: Pt reports a "bad hip" on the L. Noted ataxic bilaterally and with difficulty advancing LE's at times during gait training. LLE Coordination: decreased gross motor (bilaterally)    Cervical / Trunk Assessment Cervical  / Trunk Assessment: Neck Surgery  Communication   Communication: No difficulties  Cognition Arousal/Alertness: Awake/alert Behavior During Therapy: WFL for tasks assessed/performed Overall Cognitive Status: Within Functional Limits for tasks assessed                                 General Comments: Pt unaware of precautions initially, but demonstrated ability to follow precautions throughout session with occasional cues.        General Comments General comments (skin integrity, edema, etc.): VSS    Exercises     Assessment/Plan    PT Assessment Patient needs continued PT services  PT Problem List Decreased strength;Decreased activity tolerance;Decreased balance;Decreased mobility;Decreased coordination;Decreased knowledge of use of DME;Decreased safety awareness;Decreased knowledge of precautions;Pain       PT Treatment Interventions DME instruction;Gait training;Stair training;Functional mobility training;Therapeutic activities;Balance training;Therapeutic exercise;Patient/family education    PT Goals (Current goals can be found in the Care Plan section)  Acute Rehab PT Goals Patient Stated Goal: Home at d/c PT Goal Formulation: With patient/family Time For Goal Achievement: 07/31/22 Potential to Achieve Goals: Good    Frequency Min 5X/week     Co-evaluation               AM-PAC PT "6 Clicks" Mobility  Outcome Measure Help needed turning from your back to your side while in a flat bed without using bedrails?: A Little Help needed moving from lying on your back to sitting on the side of a flat bed without using bedrails?: A Little Help needed moving to and from a bed to a chair (including a wheelchair)?: A Little Help needed standing up from a chair using your arms (e.g.,  wheelchair or bedside chair)?: A Little Help needed to walk in hospital room?: A Little Help needed climbing 3-5 steps with a railing? : A Little 6 Click Score: 18    End of  Session Equipment Utilized During Treatment: Gait belt;Cervical collar Activity Tolerance: Patient tolerated treatment well Patient left: in chair;with call bell/phone within reach;with family/visitor present Nurse Communication: Mobility status PT Visit Diagnosis: Unsteadiness on feet (R26.81);Pain Pain - part of body:  (neck)    Time: 1093-2355 PT Time Calculation (min) (ACUTE ONLY): 21 min   Charges:   PT Evaluation $PT Eval Low Complexity: 1 Low          Rolinda Roan, PT, DPT Acute Rehabilitation Services Secure Chat Preferred Office: [phone]   Thelma Comp 07/24/2022, 1:52 PM

## 2022-07-24 NOTE — Anesthesia Postprocedure Evaluation (Signed)
Anesthesia Post Note  Patient: Alex West  Procedure(s) Performed: Anterior Cervical Discectomy and Fusion Cervical Three-Four/Four-Five/Five-Six     Patient location during evaluation: PACU Anesthesia Type: General Level of consciousness: awake and alert Pain management: pain level controlled Vital Signs Assessment: post-procedure vital signs reviewed and stable Respiratory status: spontaneous breathing, nonlabored ventilation, respiratory function stable and patient connected to nasal cannula oxygen Cardiovascular status: blood pressure returned to baseline and stable Postop Assessment: no apparent nausea or vomiting Anesthetic complications: no   No notable events documented.  Last Vitals:  Vitals:   07/24/22 0502 07/24/22 0746  BP: (!) 142/97 130/81  Pulse: 72 (!) 57  Resp: 20 16  Temp: 37.2 C 36.6 C  SpO2: 98% 99%    Last Pain:  Vitals:   07/24/22 0643  TempSrc:   PainSc: 3                  Shalan Neault

## 2022-07-24 NOTE — Evaluation (Signed)
Occupational Therapy Evaluation Patient Details Name: Alex West MRN: [id number] DOB: Apr 02, 1956 Today's Date: 07/24/2022   History of Present Illness Pt is a 66 y.o male s/p anterior cervical discectomy and fusion C3-6 on 07/23/2022. PMH significant for gait abnormality, cervical myelopathy, autoimmune hepatitis, hypothyroidism, BPH, arthritis, mitral valve regurgitation, and vertigo.   Clinical Impression   PTA, pt lived with his wife and was independent in ADL, IADL, and driving. Upon eval, pt requiring RW and min guard A for balance during functional mobility. Pt benefited from min verbal cues throughout session for RW management. Pt educated and demonstrating use of compensatory techniques for LB dressing, UB dressing, oral care, toilet transfers, and walk-in shower transfer. Pt requiring up to min A for LB ADLs and brace application at this time, and reporting wife can assist at home. Recommend discharge home with HHOT to optimize safety and independence in ADL and IADL.      Recommendations for follow up therapy are one component of a multi-disciplinary discharge planning process, led by the attending physician.  Recommendations may be updated based on patient status, additional functional criteria and insurance authorization.   Follow Up Recommendations  Home health OT    Assistance Recommended at Discharge Intermittent Supervision/Assistance  Patient can return home with the following A little help with bathing/dressing/bathroom;A little help with walking and/or transfers;Assistance with cooking/housework;Assist for transportation;Help with stairs or ramp for entrance    Functional Status Assessment     Equipment Recommendations  BSC/3in1;Other (comment) (Encouraged pt to order reacher and sock aid. Added to handout)    Recommendations for Other Services PT consult     Precautions / Restrictions Precautions Precautions: Cervical Precaution Booklet Issued: Yes  (comment) Precaution Comments: All education provided and reinforced with handout Required Braces or Orthoses: Cervical Brace Cervical Brace: Hard collar Restrictions Weight Bearing Restrictions: No      Mobility Bed Mobility Overal bed mobility: Needs Assistance Bed Mobility: Rolling, Sidelying to Sit Rolling: Supervision Sidelying to sit: Min guard       General bed mobility comments: Cueing for technique following initial education. Min guard A sidelying<>sit for safety    Transfers Overall transfer level: Needs assistance Equipment used: Rolling walker (2 wheels) Transfers: Sit to/from Stand Sit to Stand: Min guard           General transfer comment: Min guard A for safety. Pt requiring significantly increased time for power up      Balance Overall balance assessment: Needs assistance Sitting-balance support: No upper extremity supported, Feet supported Sitting balance-Leahy Scale: Good Sitting balance - Comments: Pt using AE to don LB dressing   Standing balance support: Bilateral upper extremity supported, During functional activity Standing balance-Leahy Scale: Poor Standing balance comment: Reliant on RW                           ADL either performed or assessed with clinical judgement   ADL Overall ADL's : Needs assistance/impaired Eating/Feeding: Set up;Sitting   Grooming: Standing;Supervision/safety   Upper Body Bathing: Set up;Sitting   Lower Body Bathing: Min guard;Sit to/from stand   Upper Body Dressing : Set up;Sitting Upper Body Dressing Details (indicate cue type and reason): Pt educated and demonstrating donning shirt and brace with set-up. Pt initially requiring min A for brace application, however, when encouraged for second attempt, performing with supervision. Lower Body Dressing: Sit to/from stand;With adaptive equipment;Minimal assistance Lower Body Dressing Details (indicate cue type and reason): Pt educated and  demonstrating use of AE for LBD. Pt requiring min A for use of adaptive strategies, and reporting wife can help at home. Toilet Transfer: Min guard;Comfort height toilet;Rolling walker (2 wheels);Ambulation;Cueing for safety Toilet Transfer Details (indicate cue type and reason): Cueing for safe use of RW and placement during functional transfers. Toileting- Water quality scientist and Hygiene: Min guard;Sit to/from stand   Tub/ Shower Transfer: Walk-in shower;Min guard;Ambulation;BSC/3in1;Rolling walker (2 wheels) Tub/Shower Transfer Details (indicate cue type and reason): Performing walk-in shower transfer with min guard after initial education. educated to place "good leg" in shower first, but pt reporting he feels safer placing "bad leg" in shower first Functional mobility during ADLs: Min guard;Rolling walker (2 wheels)       Vision Baseline Vision/History: 1 Wears glasses Patient Visual Report: No change from baseline Vision Assessment?: No apparent visual deficits Additional Comments: reading from phone at end of session.     Perception     Praxis      Pertinent Vitals/Pain Pain Assessment Pain Assessment: Faces Faces Pain Scale: Hurts a little bit Pain Location: operative site Pain Descriptors / Indicators: Discomfort, Operative site guarding Pain Intervention(s): Limited activity within patient's tolerance, Monitored during session     Hand Dominance     Extremity/Trunk Assessment Upper Extremity Assessment Upper Extremity Assessment: Generalized weakness;RUE deficits/detail RUE Deficits / Details: numbness, weakness RUE Sensation: decreased light touch RUE Coordination: decreased fine motor   Lower Extremity Assessment Lower Extremity Assessment: Defer to PT evaluation   Cervical / Trunk Assessment Cervical / Trunk Assessment: Neck Surgery   Communication Communication Communication: No difficulties   Cognition Arousal/Alertness: Awake/alert Behavior During  Therapy: WFL for tasks assessed/performed Overall Cognitive Status: Within Functional Limits for tasks assessed                                 General Comments: Pt unaware of precautions initially, but demonstrated ability to follow precautions throughout session with occasional cues.     General Comments  VSS    Exercises     Shoulder Instructions      Home Living Family/patient expects to be discharged to:: Private residence Living Arrangements: Spouse/significant other Available Help at Discharge: Family;Available PRN/intermittently (Initially available 24 hours per day) Type of Home: House Home Access: Stairs to enter [employer] of Steps: 2 Entrance Stairs-Rails: Right Home Layout: Multi-level;Able to live on main level with bedroom/bathroom     Bathroom Shower/Tub: Occupational psychologist: Handicapped height     Home Equipment: None   Additional Comments: Pt reporting he does not have any equipment at home.      Prior Functioning/Environment Prior Level of Function : Independent/Modified Independent;Driving (retired)             Mobility Comments: ambulated without AD ADLs Comments: Reports he was independent        OT Problem List: Decreased strength;Decreased activity tolerance;Impaired balance (sitting and/or standing);Decreased safety awareness;Decreased knowledge of use of DME or AE;Decreased knowledge of precautions;Pain      OT Treatment/Interventions: Self-care/ADL training;Therapeutic exercise;DME and/or AE instruction;Therapeutic activities;Patient/family education;Balance training    OT Goals(Current goals can be found in the care plan section) Acute Rehab OT Goals Patient Stated Goal: Get better OT Goal Formulation: With patient Time For Goal Achievement: 08/07/22 Potential to Achieve Goals: Good ADL Goals Pt Will Perform Grooming: with modified independence;standing Pt Will Perform Upper Body Dressing:  with modified independence;sitting Pt Will Perform Lower  Body Dressing: with modified independence;sit to/from stand Pt Will Transfer to Toilet: with modified independence;ambulating;regular height toilet Pt Will Perform Tub/Shower Transfer: Shower transfer;with modified independence;3 in 1;ambulating;rolling walker  OT Frequency: Min 2X/week    Co-evaluation              AM-PAC OT "6 Clicks" Daily Activity     Outcome Measure Help from another person eating meals?: A Little Help from another person taking care of personal grooming?: A Little Help from another person toileting, which includes using toliet, bedpan, or urinal?: A Little Help from another person bathing (including washing, rinsing, drying)?: A Little Help from another person to put on and taking off regular upper body clothing?: A Little Help from another person to put on and taking off regular lower body clothing?: A Little 6 Click Score: 18   End of Session Equipment Utilized During Treatment: Gait belt;Rolling walker (2 wheels) Nurse Communication: Mobility status  Activity Tolerance: Patient tolerated treatment well Patient left: in chair;with call bell/phone within reach  OT Visit Diagnosis: Unsteadiness on feet (R26.81);Muscle weakness (generalized) (M62.81);Other abnormalities of gait and mobility (R26.89);Pain Pain - part of body:  (cervical spine)                Time: 3685-9923 OT Time Calculation (min): 35 min Charges:  OT General Charges $OT Visit: 1 Visit OT Evaluation $OT Eval Low Complexity: 1 Low OT Treatments $Self Care/Home Management : 8-22 mins  Shanda Howells, OTR/L [facility] Acute Rehabilitation Office: [phone]  Alex West 07/24/2022, 10:45 AM

## 2022-07-24 NOTE — Discharge Instructions (Signed)
Wound Care You may shower. Remove dressing in 3 days  Leave incision open to air. Do not scrub directly on incision.  Do not put any creams, lotions, or ointments on incision. Activity Walk each and every day, increasing distance each day. No lifting greater than 5 lbs.  Avoid excessive neck motion. No driving for 2 weeks; may ride as a passenger locally. Wear neck brace at all times except when showering.  If provided soft collar, may wear for comfort unless otherwise instructed. Diet Resume your normal diet.  Return to Work Will be discussed at you follow up appointment. Call Your Doctor If Any of These Occur Redness, drainage, or swelling at the wound.  Temperature greater than 101 degrees. Severe pain not relieved by pain medication. Increased difficulty swallowing. Incision starts to come apart. Follow Up Appt Call [phone]) for problems.  If you have any hardware placed in your spine, you will need an x-ray before your appointment.

## 2022-07-24 NOTE — Progress Notes (Signed)
Patient alert and oriented, mae's well, voiding adequate amount of urine, swallowing without difficulty, no c/o pain at time of discharge. Patient discharged home with family. Script and discharged instructions given to patient. Patient and family stated understanding of instructions given. Patient has an appointment with Dr. Thomas in 2 weeks 

## 2022-07-25 ENCOUNTER — Encounter (HOSPITAL_COMMUNITY): Payer: Self-pay | Admitting: Neurosurgery

## 2022-08-15 NOTE — Therapy (Signed)
OUTPATIENT PHYSICAL THERAPY EVALUATION   Patient Name: Alex West MRN: [id number] DOB:[birth year], 66 y.o., male Today's Date: 08/19/2022    PT End of Session - 08/19/22 1530     Visit Number 1    Date for PT Re-Evaluation 09/30/22    Authorization Type [employer] - VL: 60 (7 used)    PT Start Time 7673    PT Stop Time 1620    PT Time Calculation (min) 50 min    Activity Tolerance Patient tolerated treatment well    Behavior During Therapy WFL for tasks assessed/performed             Past Medical History:  Diagnosis Date   Arthritis    BPH (benign prostatic hyperplasia)    Colon polyp    ED (erectile dysfunction)    Elevated liver enzymes    Gallstones    Hepatitis    History of anemia    Hypercholesterolemia    Hypothyroidism    Mild tricuspid regurgitation    Mitral valve regurgitation    Thyroid disease    Vertigo    Past Surgical History:  Procedure Laterality Date   ANTERIOR CERVICAL DECOMP/DISCECTOMY FUSION N/A 07/23/2022   Procedure: Anterior Cervical Discectomy and Fusion Cervical Three-Four/Four-Five/Five-Six;  Surgeon: Vallarie Mare, MD;  Location: [place] [facility];  Service: Neurosurgery;  Laterality: N/A;  3C   FLEXIBLE SIGMOIDOSCOPY     Patient Active Problem List   Diagnosis Date Noted   Stenosis of cervical spine with myelopathy ([place]) 07/23/2022   Gait abnormality 06/18/2022   Cervical myelopathy ([place]) 06/17/2022   Weakness 06/06/2022   Neck pain 06/06/2022   OA (osteoarthritis) of hip 07/24/2021   Immunosuppressed status ([place]) 09/05/2014   Hepatitis, autoimmune ([place]) 09/05/2014    PCP: Lawerance Cruel, MD  REFERRING PROVIDER: Vallarie Mare, MD  REFERRING DIAG: [phone] (ICD-10-CM) - Cervical spondylosis with myelopathy  THERAPY DIAG:  Other abnormalities of gait and mobility  Muscle weakness (generalized)  Difficulty in walking, not elsewhere classified  Unsteadiness on feet  RATIONALE FOR EVALUATION AND TREATMENT:  Rehabilitation  ONSET DATE: 07/23/2022 - ACDF C3-4, C4-5, C5-6   NEXT MD VISIT: 09/06/22 - surgery f/u; Pt will see his neurologist Marcial Pacas, MD) on 08/21/22   SUBJECTIVE:   SUBJECTIVE STATEMENT: Pt is almost 4 weeks s/p ACDF C3-4, C4-5, C5-6 on 07/23/22. Pt reports he was given instructions to walk as part of his rehab following the neck surgery but has been unable to do so due to his L hip OA. He reports he feels like his current symptoms are consistent with C5 nerve palsy - notes muscle loss in the B deltoids as well as weakness in B UE which he feels started right after the surgery. He also notes weakness and heaviness in his legs. He still has limited ROM in his L hip but denies pain.  PT orders: PT for ambulation and extremity strength, no neck ROM exercises  PAIN:  Are you having pain? No - neck  Are you having pain? No and Yes: NPRS scale: 0/10 currently, but up to 8/10 Pain location: L shoulder Pain description: burning, tingling, radiating at times down L arm Aggravating factors: cold Relieving factors: heat  PERTINENT HISTORY: ACDF C3-4, C4-5, C5-6 on 07/23/22; L hip pain - severe OA; arthiritis; BPH; anemia; hypothyroidism; MVR & TVR; thyroid disease; vertigo  PRECAUTIONS: Cervical - cervical collar & no neck ROM exercises  WEIGHT BEARING RESTRICTIONS: No  FALLS:  Has patient fallen in last 6  months? No  LIVING ENVIRONMENT: Lives with: lives with their spouse Lives in: House/apartment Stairs: Yes: Internal: 14 steps; on right going up and External: 3 steps; on left going up Has following equipment at home: Single point cane and Walker - 2 wheeled  OCCUPATION: Retired  PLOF: Independent and Leisure: play golf, gym 5x/wk   PATIENT GOALS: "I want to get back to walking normally again."   OBJECTIVE:   DIAGNOSTIC FINDINGS:  06/13/22 - Cervical MRI:  IMPRESSION: This MRI of the cervical spine without contrast shows the following: Increase signal within the spinal  cord adjacent to C3-C4 consistent with compressive myelopathy. At C3-C4 and C5-C6 there is severe spinal stenosis, at C4-C5 there is moderate spinal stenosis and at C2-C3, C6-C7 and C7-T1 there is mild spinal stenosis. There are various degrees of foraminal narrowing at every level in the cervical spine.  This is severe at C3-C4 where there is probable bilateral C4 nerve root compression, and moderately severe at C4-C5 and C5-C6 where there is potential for bilateral C5 and C6 nerve root compression.  PATIENT SURVEYS:  NDI  23/50 = 46.0%, moderate disability LEFS 10/80 = 12.5%, 87.5% disability  COGNITION: Overall cognitive status: Within functional limits for tasks assessed     SENSATION: Light touch: Impaired  Intermittent numbness in R thumb and B hands, burning and tingling in L shoulder and UE  MUSCLE LENGTH: Hamstrings: mild/mod tight L>R ITB: mod tight B Piriformis: mod/severe tight L, mod tight R Hip flexors: mod tight L>R Quads: mod tight L>R  POSTURE:  No Significant postural limitations and cervical collar in place  UPPER EXTREMITY ROM: Deferred on eval d/t pt reported post-op precautions  Active ROM Right eval Left eval  Shoulder flexion    Shoulder extension    Shoulder abduction    Shoulder adduction    Shoulder internal rotation    Shoulder external rotation    Elbow flexion    Elbow extension    Wrist flexion    Wrist extension    Wrist ulnar deviation    Wrist radial deviation    Wrist pronation    Wrist supination    (Blank rows = not tested)  UPPER EXTREMITY MMT:  Deferred on eval d/t pt reported post-op precautions  MMT Right eval Left eval  Shoulder flexion    Shoulder extension    Shoulder abduction    Shoulder adduction    Shoulder internal rotation    Shoulder external rotation    Middle trapezius    Lower trapezius    Elbow flexion    Elbow extension    Wrist flexion    Wrist extension    Wrist ulnar deviation    Wrist radial  deviation    Wrist pronation    Wrist supination    Grip strength (lbs)    (Blank rows = not tested)  LOWER EXTREMITY ROM: B hip ROM mild to moderately limited in all planes with greatest limitation in L hip flexion and IR/ER  LOWER EXTREMITY MMT: Tested in sitting  MMT Right eval Left eval  Hip flexion 4+ 4  Hip extension 4 4+  Hip abduction 4+ 4  Hip adduction 4+ 4+  Hip internal rotation 4 4  Hip external rotation 4 4  Knee flexion 4 4  Knee extension 4+ 4+  Ankle dorsiflexion 4 4+  Ankle plantarflexion    Ankle inversion    Ankle eversion     (Blank rows = not tested)  BED MOBILITY:  NT  TRANSFERS: Assistive device utilized: None  Sit to stand: Modified independence Stand to sit: Modified independence Chair to chair:  NT Floor:  NT  GAIT: Distance walked: 60 ft Assistive device utilized: Single point cane Level of assistance: Modified independence Gait pattern: step through pattern, decreased stride length, decreased hip/knee flexion- Right, decreased hip/knee flexion- Left, lateral hip instability, and decreased trunk rotation Comments: 1.89 ft/sec gait speed = limited community ambulator  RAMP: Level of Assistance:  NT Assistive device utilized:  NT Ramp Comments:   CURB:  Level of Assistance:  NT Assistive device utilized:  NT Curb Comments:   STAIRS:  Level of Assistance: SBA  Stair Negotiation Technique: Step to Pattern with Single Rail on Right  Number of Stairs: 14   Height of Stairs: 7  Comments: leads with R LE  FUNCTIONAL TESTS:  5 times sit to stand: 16.63 sec; >15 sec = recurrent fall risk Timed up and go (TUG): 23.93 sec w/o AD, 17.03 sec with SPC; >13.5 sec = high fall risk 10 meter walk test: 17.38 sec with SPC - gait speed = 1.89 ft/sec; ,1.8 ft/sec = risk for recurrent falls Dynamic Gait Index: 12/24 - Scores of 19 or less are predictive of falls in older community living adults   TODAY'S TREATMENT:  08/19/22 Eval  only   PATIENT EDUCATION:  Education details: PT eval findings and anticipated POC Person educated: Patient Education method: Explanation Education comprehension: verbalized understanding  HOME EXERCISE PROGRAM: TBD  ASSESSMENT:  CLINICAL IMPRESSION: Korben Carcione is a 66 y.o. male who was seen today for physical therapy evaluation and treatment for cervical spondylosis with myelopathy s/p ACDF C3-4, C4-5, C5-6 on 07/23/22. He reports worsening UE weakness and deltoid atrophy since surgery which he feels seems consistent with C5 nerve palsy - he has an appointment with his neurologist on 08/21/22 to discuss this. He also notes LE weakness and increased gait instability and has been unable to complete the prescribed progressive walking program as part of his post-op home rehab program due to limitation from severe L hip OA in need of THR. He was previously seen by this PT for his L hip earlier this year with 90% improvement in the muscle tension and tightness in his L hip reported which allowed him to resume a normal stride length when walking w/o increased pain or limited ROM by the end of the PT episode. He noted his hip continued to improve until the neck surgery. He is hoping to f/u with Dr. Raeford Razor to see if he can get another hip injection to tide him over until he recovers from his neck surgery and can proceed with his L THR. Current deficits include intermittent L hip pain, unsteady gait with decreased stride length and dependence on AD, decreased L>R hip AROM, impaired L>R proximal LE flexibility and decreased B LE strength. Standardized balance testing reveals high fall risk or increased risk for recurrent falls across all tests. Karmine "[name]" will benefit from skilled PT to address the above deficits to improve flexibility and overall LE strength to help decrease his hip pain and allow for normal mobility and gait with improved balance and decreased fall risk.  OBJECTIVE  IMPAIRMENTS: Abnormal gait, decreased activity tolerance, decreased balance, decreased endurance, decreased mobility, difficulty walking, decreased strength, decreased safety awareness, increased fascial restrictions, impaired perceived functional ability, increased muscle spasms, impaired flexibility, impaired sensation, improper body mechanics, postural dysfunction, and pain.   ACTIVITY LIMITATIONS: carrying, lifting, bending, sitting, standing, squatting, sleeping, stairs, transfers,  bed mobility, bathing, toileting, dressing, reach over head, hygiene/grooming, locomotion level, and caring for others  PARTICIPATION LIMITATIONS: meal prep, cleaning, laundry, driving, community activity, and yard work  PERSONAL FACTORS: Fitness, Past/current experiences, Time since onset of injury/illness/exacerbation, Transportation, and 3+ comorbidities: ACDF C3-4, C4-5, C5-6 on 07/23/22; L hip pain - severe OA; arthiritis; BPH; anemia; hypothyroidism; MVR & TVR; thyroid disease; vertigo  are also affecting patient's functional outcome.   REHAB POTENTIAL: Good  CLINICAL DECISION MAKING: Evolving/moderate complexity  EVALUATION COMPLEXITY: Moderate   GOALS: Goals reviewed with patient? Yes  SHORT TERM GOALS: Target date: 09/09/2022   Patient will be independent with initial HEP. Baseline:  Goal status: INITIAL  2.  Patient will improve 5x STS time to </= 15 seconds for improved efficiency and safety with transfers Baseline: 16.63 sec Goal status: INITIAL  LONG TERM GOALS: Target date: 09/30/2022   Patient will be independent with advanced/ongoing HEP to improve outcomes and carryover.  Baseline:  Goal status: INITIAL  2.  Patient will demonstrate improved B LE strength to >/= 4+/5 for improved stability and ease of mobility. Baseline:  Goal status: INITIAL  3.  Patient will be able to ambulate 600' with LRAD and normal gait pattern across various surfaces to access community.  Baseline:  Goal  status: INITIAL  4.  Patient will improve gait velocity to at least 2.62 ft/sec for improved gait efficiency and safety as a community ambulator. Baseline:  Goal status: INITIAL  5. Patient will be able to ascend/descend stairs with 1 HR and reciprocal step pattern safely to access home and community.  Baseline:  Goal status: INITIAL  6.  Patient will report 20/80 on LEFS to demonstrate improved functional ability. Baseline: LEFS 10/80 = 12.5% (87.5% disability) Goal status: INITIAL  7.  Patient will demonstrate decreased TUG time to </= 13.5 sec to decrease risk for falls with transitional mobility Baseline: 23.93 sec w/o AD, 17.03 sec with SPC Goal status: INITIAL  9.  Patient will demonstrate at least 19/24 on DGI to decrease risk of falls. Baseline: DGI = 12/24 Goal status: INITIAL     PLAN: PT FREQUENCY: 2x/week  PT DURATION: 6 weeks  PLANNED INTERVENTIONS: Therapeutic exercises, Therapeutic activity, Neuromuscular re-education, Balance training, Gait training, Patient/Family education, Self Care, Joint mobilization, Stair training, DME instructions, Dry Needling, Electrical stimulation, Cryotherapy, Moist heat, Taping, Ultrasound, Ionotophoresis '4mg'$ /ml Dexamethasone, Manual therapy, and Re-evaluation  PLAN FOR NEXT SESSION: Create initial HEP or modify HEP from recent PT episode (Access code: [email or web address]) to address hip flexibility, LE strengthening and balance/gait stability    Percival Spanish, PT 08/19/2022, 7:24 PM

## 2022-08-18 ENCOUNTER — Encounter: Payer: Self-pay | Admitting: Neurology

## 2022-08-19 ENCOUNTER — Other Ambulatory Visit: Payer: Self-pay

## 2022-08-19 ENCOUNTER — Ambulatory Visit: Payer: BC Managed Care – PPO | Attending: Neurosurgery | Admitting: Physical Therapy

## 2022-08-19 ENCOUNTER — Encounter: Payer: Self-pay | Admitting: Physical Therapy

## 2022-08-19 DIAGNOSIS — R262 Difficulty in walking, not elsewhere classified: Secondary | ICD-10-CM | POA: Diagnosis not present

## 2022-08-19 DIAGNOSIS — M25552 Pain in left hip: Secondary | ICD-10-CM | POA: Diagnosis not present

## 2022-08-19 DIAGNOSIS — R2681 Unsteadiness on feet: Secondary | ICD-10-CM | POA: Diagnosis not present

## 2022-08-19 DIAGNOSIS — M6281 Muscle weakness (generalized): Secondary | ICD-10-CM | POA: Diagnosis not present

## 2022-08-19 DIAGNOSIS — R2689 Other abnormalities of gait and mobility: Secondary | ICD-10-CM | POA: Insufficient documentation

## 2022-08-21 ENCOUNTER — Ambulatory Visit: Payer: BC Managed Care – PPO

## 2022-08-21 ENCOUNTER — Encounter: Payer: Self-pay | Admitting: Neurology

## 2022-08-21 ENCOUNTER — Ambulatory Visit (INDEPENDENT_AMBULATORY_CARE_PROVIDER_SITE_OTHER): Payer: BC Managed Care – PPO | Admitting: Neurology

## 2022-08-21 VITALS — BP 148/97 | HR 68 | Ht 75.0 in | Wt 243.5 lb

## 2022-08-21 DIAGNOSIS — R531 Weakness: Secondary | ICD-10-CM

## 2022-08-21 DIAGNOSIS — R2689 Other abnormalities of gait and mobility: Secondary | ICD-10-CM

## 2022-08-21 DIAGNOSIS — R2681 Unsteadiness on feet: Secondary | ICD-10-CM

## 2022-08-21 DIAGNOSIS — R202 Paresthesia of skin: Secondary | ICD-10-CM | POA: Diagnosis not present

## 2022-08-21 DIAGNOSIS — M25552 Pain in left hip: Secondary | ICD-10-CM | POA: Diagnosis not present

## 2022-08-21 DIAGNOSIS — M6281 Muscle weakness (generalized): Secondary | ICD-10-CM

## 2022-08-21 DIAGNOSIS — R262 Difficulty in walking, not elsewhere classified: Secondary | ICD-10-CM | POA: Diagnosis not present

## 2022-08-21 DIAGNOSIS — G959 Disease of spinal cord, unspecified: Secondary | ICD-10-CM

## 2022-08-21 MED ORDER — DULOXETINE HCL 60 MG PO CPEP: 60.0000 mg | ORAL_CAPSULE | Freq: Every day | ORAL | 6 refills | Status: DC

## 2022-08-21 NOTE — Therapy (Signed)
OUTPATIENT PHYSICAL THERAPY TREATMENT   Patient Name: Alex West MRN: [id number] DOB:1956/01/18, 66 y.o., male Today's Date: 08/21/2022    PT End of Session - 08/21/22 1149     Visit Number 2    Date for PT Re-Evaluation 09/30/22    Authorization Type [employer] - VL: 25 (7 used)    PT Start Time 1101    PT Stop Time 1145    PT Time Calculation (min) 44 min    Activity Tolerance Patient tolerated treatment well    Behavior During Therapy WFL for tasks assessed/performed              Past Medical History:  Diagnosis Date   Arthritis    BPH (benign prostatic hyperplasia)    Colon polyp    ED (erectile dysfunction)    Elevated liver enzymes    Gallstones    Hepatitis    History of anemia    Hypercholesterolemia    Hypothyroidism    Mild tricuspid regurgitation    Mitral valve regurgitation    Thyroid disease    Vertigo    Past Surgical History:  Procedure Laterality Date   ANTERIOR CERVICAL DECOMP/DISCECTOMY FUSION N/A 07/23/2022   Procedure: Anterior Cervical Discectomy and Fusion Cervical Three-Four/Four-Five/Five-Six;  Surgeon: Vallarie Mare, MD;  Location: [facility];  Service: Neurosurgery;  Laterality: N/A;  3C   FLEXIBLE SIGMOIDOSCOPY     Patient Active Problem List   Diagnosis Date Noted   Stenosis of cervical spine with myelopathy ([place]) 07/23/2022   Gait abnormality 06/18/2022   Cervical myelopathy ([place]) 06/17/2022   Weakness 06/06/2022   Neck pain 06/06/2022   OA (osteoarthritis) of hip 07/24/2021   Immunosuppressed status ([place]) 09/05/2014   Hepatitis, autoimmune ([place]) 09/05/2014    PCP: Lawerance Cruel, MD  REFERRING PROVIDER: Vallarie Mare, MD  REFERRING DIAG: [phone] (ICD-10-CM) - Cervical spondylosis with myelopathy  THERAPY DIAG:  Other abnormalities of gait and mobility  Muscle weakness (generalized)  Difficulty in walking, not elsewhere classified  Unsteadiness on feet  Pain in left hip  RATIONALE FOR EVALUATION AND  TREATMENT: Rehabilitation  ONSET DATE: 07/23/2022 - ACDF C3-4, C4-5, C5-6   NEXT MD VISIT: 09/06/22 - surgery f/u; Pt will see his neurologist Marcial Pacas, MD) on 08/21/22   SUBJECTIVE:   SUBJECTIVE STATEMENT: Pt reports worries about potential nerve palsy and ongoing weakness in legs.  PT orders: PT for ambulation and extremity strength, no neck ROM exercises  PAIN:  Are you having pain? No - neck  Are you having pain? No  PERTINENT HISTORY: ACDF C3-4, C4-5, C5-6 on 07/23/22; L hip pain - severe OA; arthiritis; BPH; anemia; hypothyroidism; MVR & TVR; thyroid disease; vertigo  PRECAUTIONS: Cervical - cervical collar & no neck ROM exercises  WEIGHT BEARING RESTRICTIONS: No  FALLS:  Has patient fallen in last 6 months? No  LIVING ENVIRONMENT: Lives with: lives with their spouse Lives in: House/apartment Stairs: Yes: Internal: 14 steps; on right going up and External: 3 steps; on left going up Has following equipment at home: Single point cane and Walker - 2 wheeled  OCCUPATION: Retired  PLOF: Independent and Leisure: play golf, gym 5x/wk   PATIENT GOALS: "I want to get back to walking normally again."   OBJECTIVE:   DIAGNOSTIC FINDINGS:  06/13/22 - Cervical MRI:  IMPRESSION: This MRI of the cervical spine without contrast shows the following: Increase signal within the spinal cord adjacent to C3-C4 consistent with compressive myelopathy. At C3-C4 and C5-C6 there  is severe spinal stenosis, at C4-C5 there is moderate spinal stenosis and at C2-C3, C6-C7 and C7-T1 there is mild spinal stenosis. There are various degrees of foraminal narrowing at every level in the cervical spine.  This is severe at C3-C4 where there is probable bilateral C4 nerve root compression, and moderately severe at C4-C5 and C5-C6 where there is potential for bilateral C5 and C6 nerve root compression.  PATIENT SURVEYS:  NDI  23/50 = 46.0%, moderate disability LEFS 10/80 = 12.5%, 87.5%  disability  COGNITION: Overall cognitive status: Within functional limits for tasks assessed     SENSATION: Light touch: Impaired  Intermittent numbness in R thumb and B hands, burning and tingling in L shoulder and UE  MUSCLE LENGTH: Hamstrings: mild/mod tight L>R ITB: mod tight B Piriformis: mod/severe tight L, mod tight R Hip flexors: mod tight L>R Quads: mod tight L>R  POSTURE:  No Significant postural limitations and cervical collar in place  UPPER EXTREMITY ROM: Deferred on eval d/t pt reported post-op precautions  Active ROM Right eval Left eval  Shoulder flexion    Shoulder extension    Shoulder abduction    Shoulder adduction    Shoulder internal rotation    Shoulder external rotation    Elbow flexion    Elbow extension    Wrist flexion    Wrist extension    Wrist ulnar deviation    Wrist radial deviation    Wrist pronation    Wrist supination    (Blank rows = not tested)  UPPER EXTREMITY MMT:  Deferred on eval d/t pt reported post-op precautions  MMT Right eval Left eval  Shoulder flexion    Shoulder extension    Shoulder abduction    Shoulder adduction    Shoulder internal rotation    Shoulder external rotation    Middle trapezius    Lower trapezius    Elbow flexion    Elbow extension    Wrist flexion    Wrist extension    Wrist ulnar deviation    Wrist radial deviation    Wrist pronation    Wrist supination    Grip strength (lbs)    (Blank rows = not tested)  LOWER EXTREMITY ROM: B hip ROM mild to moderately limited in all planes with greatest limitation in L hip flexion and IR/ER  LOWER EXTREMITY MMT: Tested in sitting  MMT Right eval Left eval  Hip flexion 4+ 4  Hip extension 4 4+  Hip abduction 4+ 4  Hip adduction 4+ 4+  Hip internal rotation 4 4  Hip external rotation 4 4  Knee flexion 4 4  Knee extension 4+ 4+  Ankle dorsiflexion 4 4+  Ankle plantarflexion    Ankle inversion    Ankle eversion     (Blank rows = not  tested)  BED MOBILITY:  NT  TRANSFERS: Assistive device utilized: None  Sit to stand: Modified independence Stand to sit: Modified independence Chair to chair:  NT Floor:  NT  GAIT: Distance walked: 60 ft Assistive device utilized: Single point cane Level of assistance: Modified independence Gait pattern: step through pattern, decreased stride length, decreased hip/knee flexion- Right, decreased hip/knee flexion- Left, lateral hip instability, and decreased trunk rotation Comments: 1.89 ft/sec gait speed = limited community ambulator  RAMP: Level of Assistance:  NT Assistive device utilized:  NT Ramp Comments:   CURB:  Level of Assistance:  NT Assistive device utilized:  NT Curb Comments:   STAIRS:  Level of Assistance: SBA  Stair Negotiation Technique: Step to Pattern with Single Rail on Right  Number of Stairs: 14   Height of Stairs: 7  Comments: leads with R LE  FUNCTIONAL TESTS:  5 times sit to stand: 16.63 sec; >15 sec = recurrent fall risk Timed up and go (TUG): 23.93 sec w/o AD, 17.03 sec with SPC; >13.5 sec = high fall risk 10 meter walk test: 17.38 sec with SPC - gait speed = 1.89 ft/sec; ,1.8 ft/sec = risk for recurrent falls Dynamic Gait Index: 12/24 - Scores of 19 or less are predictive of falls in older community living adults   TODAY'S TREATMENT: 08/21/22 TE: Bike L2x89mn Gait 450 ft; 2 lap w/o cane, 4 laps with cane Seated piriformis stretch x 30 sec R LE Seated hamstring stretch x 30 sec Supine figure 4 stretch and KTOS x 30" each Supine SKTC x 30 sec Seated LAQ x 10 Seated march x 10 STS x 10 - cues not to brace onto chair with thigh 08/19/22 Eval only   PATIENT EDUCATION:  Education details: PT eval findings and anticipated POC Person educated: Patient Education method: Explanation Education comprehension: verbalized understanding  HOME EXERCISE PROGRAM: Access Code: [email or web address]: [email or web address] Date:  08/21/2022 Prepared by: BClarene Essex Exercises - Seated Table Hamstring Stretch  - 2 x daily - 7 x weekly - 2 sets - 30 sec hold - Supine Figure 4 Piriformis Stretch  - 2 x daily - 7 x weekly - 2 sets - 30 sec hold - Supine Piriformis Stretch with Foot on Ground  - 2 x daily - 7 x weekly - 2 sets - 30 sec hold - Sit to Stand  - 1 x daily - 3 x weekly - 3 sets - 10 reps  ASSESSMENT:  CLINICAL IMPRESSION: Pt demonstrated a good tolerance for the exercises today. Focused on hip flexibility to improve mobility. Pt was very tight in figure 4 position, pain was noted initially but eased off throughout duration of stretches. During gait, pt showed decreased hip flexion, heel strike on L and decreased step length on R. Pt is to see neurologist later today to discuss issues he is experiencing which he feels is consistent with nerve palsy at C5 level.  OBJECTIVE IMPAIRMENTS: Abnormal gait, decreased activity tolerance, decreased balance, decreased endurance, decreased mobility, difficulty walking, decreased strength, decreased safety awareness, increased fascial restrictions, impaired perceived functional ability, increased muscle spasms, impaired flexibility, impaired sensation, improper body mechanics, postural dysfunction, and pain.   ACTIVITY LIMITATIONS: carrying, lifting, bending, sitting, standing, squatting, sleeping, stairs, transfers, bed mobility, bathing, toileting, dressing, reach over head, hygiene/grooming, locomotion level, and caring for others  PARTICIPATION LIMITATIONS: meal prep, cleaning, laundry, driving, community activity, and yard work  PERSONAL FACTORS: Fitness, Past/current experiences, Time since onset of injury/illness/exacerbation, Transportation, and 3+ comorbidities: ACDF C3-4, C4-5, C5-6 on 07/23/22; L hip pain - severe OA; arthiritis; BPH; anemia; hypothyroidism; MVR & TVR; thyroid disease; vertigo  are also affecting patient's functional outcome.   REHAB POTENTIAL:  Good  CLINICAL DECISION MAKING: Evolving/moderate complexity  EVALUATION COMPLEXITY: Moderate   GOALS: Goals reviewed with patient? Yes  SHORT TERM GOALS: Target date: 09/09/2022   Patient will be independent with initial HEP. Baseline:  Goal status: IN PROGRESS  2.  Patient will improve 5x STS time to </= 15 seconds for improved efficiency and safety with transfers Baseline: 16.63 sec Goal status: IN PROGRESS  LONG TERM GOALS: Target date: 09/30/2022   Patient will be independent with advanced/ongoing HEP to improve  outcomes and carryover.  Baseline:  Goal status: IN PROGRESS  2.  Patient will demonstrate improved B LE strength to >/= 4+/5 for improved stability and ease of mobility. Baseline:  Goal status: IN PROGRESS  3.  Patient will be able to ambulate 600' with LRAD and normal gait pattern across various surfaces to access community.  Baseline:  Goal status: IN PROGRESS  4.  Patient will improve gait velocity to at least 2.62 ft/sec for improved gait efficiency and safety as a community ambulator. Baseline:  Goal status: IN PROGRESS  5. Patient will be able to ascend/descend stairs with 1 HR and reciprocal step pattern safely to access home and community.  Baseline:  Goal status: IN PROGRESS  6.  Patient will report 20/80 on LEFS to demonstrate improved functional ability. Baseline: LEFS 10/80 = 12.5% (87.5% disability) Goal status: IN PROGRESS  7.  Patient will demonstrate decreased TUG time to </= 13.5 sec to decrease risk for falls with transitional mobility Baseline: 23.93 sec w/o AD, 17.03 sec with SPC Goal status: IN PROGRESS  9.  Patient will demonstrate at least 19/24 on DGI to decrease risk of falls. Baseline: DGI = 12/24 Goal status: IN PROGRESS     PLAN: PT FREQUENCY: 2x/week  PT DURATION: 6 weeks  PLANNED INTERVENTIONS: Therapeutic exercises, Therapeutic activity, Neuromuscular re-education, Balance training, Gait training, Patient/Family  education, Self Care, Joint mobilization, Stair training, DME instructions, Dry Needling, Electrical stimulation, Cryotherapy, Moist heat, Taping, Ultrasound, Ionotophoresis '4mg'$ /ml Dexamethasone, Manual therapy, and Re-evaluation  PLAN FOR NEXT SESSION: hip flexibility, LE strengthening and balance/gait stability    Artist Pais, PTA 08/21/2022, 11:49 AM

## 2022-08-21 NOTE — Progress Notes (Addendum)
Chief Complaint  Patient presents with   Follow-up    Rm 15. Accompanied by wife. Worsening symptoms of weakness, tingling, and numbness worse in the upper body.      ASSESSMENT AND PLAN  Alex West is a 66 y.o. male  Cervical stenosis  Presurgically he has severe stenosis at C3-4, C5-6, moderate at C4-5 level, evidence of cord signal abnormality,  Gait abnormality  He continued feels frustrated of gait abnormality, brisk reflex at bilateral patella, previously radiating pain to left hip,  MRI of thoracic, lumbar spine to rule out thoracic and lumbar degenerative disease  Paresthesia  Add on Cymbalta 60 mg daily  Gabapentin 300 mg up to 3 times a day as needed  Addendum: Reviewed neurosurgical evaluation by Dr. Claudette Head September 06, 2022, severe cervical stenosis myelomalacia status post C3-6 ACDF, with continued myelopathic neuropathic pain,  Suggested MRI of cervical spine, DIAGNOSTIC DATA (LABS, IMAGING, TESTING) - I reviewed patient records, labs, notes, testing and imaging myself where available.   MEDICAL HISTORY:  Alex West is a 66 year old male, seen in request by his primary care doctor Lawerance Cruel, for evaluation of bilateral arm numbness, weakness right worse than left  I reviewed and summarized the referring note.PMHX Hypothyroidism Hyperlipidemia Mitral valve regurgitation by echo Elevated liver enzymes in December 2013, ultrasound showed fatty infiltration of liver, with positive ANA, positive actin and antimitochondrial chondral antibody, negative hepatitis ABC, normal ferritin, was seen by [place] hepatologist, liver biopsy in March 2014, chronic inflammation consistent with autoimmune hepatitis, good response to budesonide, transition to Imuran September 2015, he is currently taking Imuran 50 mg 1 and half tablets daily  Since the beginning of 2023, he noticed bilateral arm numbness, initially had neck pain radiating  pain to left shoulder, now achiness sensation, bilateral shoulder, upper extremity, persistent numbness of right thumb, left fourth and fifth fingers,  He also noticed the weakness, he used to workout heavy lifting, now is really limited, it is even difficult for him to open jars,  He denies bowel and bladder incontinence, he noticed gait abnormality, but contributed to his severe left hip pain, MRI of left hip in January 23 showed severe osteoarthritis of left hip, with moderate effusion, degeneration of lateral labrum with laceration of the anterior labrum  Update June 18, 2022: He is accompanied by his wife at today's visit, reported bilateral arm weakness, wasting of the muscles, gait abnormality, moderate left hip pain,  We personally reviewed MRI cervical spine on June 13, 2022, severe spinal stenosis at C3-4, moderate C4-5, with evidence of myelomalacia adjacent to C3-4, variable degree of foraminal narrowing at multiple levels  He does need cervical decompression to prevent further worsening of his cervical myelopathy symptoms  UPDATE August 21 2022: He underwent anterior C3-4, C4-5 C5-6 cervical decompression by Dr. Claudette Head on July 23, 2022, he will wear his hard cervical collar, no longer have significant neck pain  But he is very concerned about persistent bilateral upper extremity paresthesia, weakness, also gait abnormality, but is making progress, now progress from walker to cane, sometimes walk independently at home  He also complains of urinary urgency, constipation, quite frustrated that he is not making the progress like what he is expected  Again personally reviewed presurgical MRI cervical spine, severe spinal stenosis at C3-4, C 5 6 level, C4-5 level, there is also evidence of cord signal abnormality  PHYSICAL EXAM:  Today's Vitals   08/21/22 1609  BP: (!) 148/97  Pulse:  68  Weight: 243 lb 8 oz (110.5 kg)  Height: '6\' 3"'$  (1.905 m)   Body mass index is 30.44  kg/m.   PHYSICAL EXAMNIATION:  Gen: NAD, conversant, well nourised, well groomed                     Cardiovascular: Regular rate rhythm, no peripheral edema, warm, nontender. Eyes: Conjunctivae clear without exudates or hemorrhage Neck: Supple, no carotid bruits. Pulmonary: Clear to auscultation bilaterally   NEUROLOGICAL EXAM:  MENTAL STATUS: Speech/cognition: Awake, alert, oriented to history taking and casual conversation CRANIAL NERVES: CN II: Visual fields are full to confrontation. Pupils are round equal and briskly reactive to light. CN III, IV, VI: extraocular movement are normal. No ptosis. CN V: Facial sensation is intact to light touch CN VII: Face is symmetric with normal eye closure  CN VIII: Hearing is normal to causal conversation. CN IX, X: Phonation is normal. CN XI: Shoulder shrug is symmetric, in rigid cervical collar   MOTOR:  UE Shoulder Abduction Shoulder External Rotation Elbow Flexion Elbow  Extension Wrist Flexion Wrist Extension Grip  R '5 5 5 5 5 5 5  '$ L '4  4 4 5 5 5 5   '$ LE Hip Flexion Knee flexion Knee extension Ankle Dorsiflexion Eversion Ankle plantar Flexion Inversion  R 5- '5 5 5 5 5 5  '$ L '4 5 5 5 5 5 5   '$ -  REFLEXES: Reflexes (R/L) biceps 2/2, triceps 2/2, knees 3/3 , and ankles 2/2 . Plantar responses are extensor bilaterally  SENSORY: Intact to light touch, pinprick and vibratory sensation are intact in fingers and toes.  COORDINATION: There is no trunk or limb dysmetria noted.  GAIT/STANCE:  push-up to get up from seated position, cautious, rely on his cane,  REVIEW OF SYSTEMS:  Full 14 system review of systems performed and notable only for as above All other review of systems were negative.   ALLERGIES: Allergies  Allergen Reactions   Tadalafil Other (See Comments)    Headache     HOME MEDICATIONS: Current Outpatient Medications  Medication Sig Dispense Refill   azaTHIOprine (IMURAN) 50 MG tablet Take 75 mg by mouth  daily.     gabapentin (NEURONTIN) 300 MG capsule Take 1 capsule (300 mg total) by mouth 3 (three) times daily. (Patient taking differently: Take 300 mg by mouth daily as needed (pain).) 90 capsule 11   levothyroxine (SYNTHROID) 200 MCG tablet Take 200 mcg by mouth daily before breakfast.     sildenafil (VIAGRA) 100 MG tablet Take 100 mg by mouth as needed for erectile dysfunction.     tadalafil (CIALIS) 5 MG tablet Take 5 mg by mouth daily as needed (BPH).     Current Facility-Administered Medications  Medication Dose Route Frequency Provider Last Rate Last Admin   acetaminophen (TYLENOL) tablet 975 mg  975 mg Oral Q4H PRN Copland, Gay Filler, MD        PAST MEDICAL HISTORY: Past Medical History:  Diagnosis Date   Arthritis    BPH (benign prostatic hyperplasia)    Colon polyp    ED (erectile dysfunction)    Elevated liver enzymes    Gallstones    Hepatitis    History of anemia    Hypercholesterolemia    Hypothyroidism    Mild tricuspid regurgitation    Mitral valve regurgitation    Thyroid disease    Vertigo     PAST SURGICAL HISTORY: Past Surgical History:  Procedure Laterality Date  ANTERIOR CERVICAL DECOMP/DISCECTOMY FUSION N/A 07/23/2022   Procedure: Anterior Cervical Discectomy and Fusion Cervical Three-Four/Four-Five/Five-Six;  Surgeon: Vallarie Mare, MD;  Location: [facility];  Service: Neurosurgery;  Laterality: N/A;  3C   FLEXIBLE SIGMOIDOSCOPY      FAMILY HISTORY: Family History  Problem Relation Age of Onset   Cancer Mother        pancreatic   Cancer Father        stomach   Kidney failure Brother    Other Brother        neck tumor   Hypertension Brother    Diabetes Brother    Lupus Daughter    Hypertension Sister    Other Son        boating accident    SOCIAL HISTORY: Social History   Socioeconomic History   Marital status: Married    Spouse name: Not on file   Number of children: Not on file   Years of education: Not on file   Highest  education level: Not on file  Occupational History   Occupation: Sales  Tobacco Use   Smoking status: Former   Smokeless tobacco: Never  Substance and Sexual Activity   Alcohol use: Yes    Comment: occ   Drug use: No   Sexual activity: Not on file  Other Topics Concern   Not on file  Social History Narrative   Not on file   Social Determinants of Health   Financial Resource Strain: Not on file  Food Insecurity: Not on file  Transportation Needs: Not on file  Physical Activity: Not on file  Stress: Not on file  Social Connections: Not on file  Intimate Partner Violence: Not on file      Marcial Pacas, M.D. Ph.D.  [facility] [address] [place], [place] [address] Ph: [phone] Fax: [phone]  CC:  Lawerance Cruel, MD [place],  [place] [address]  Lawerance Cruel, MD   Total time spent reviewing the chart, obtaining history, examined patient, ordering tests, documentation, consultations and family, care coordination was 45 mins

## 2022-08-28 ENCOUNTER — Encounter: Payer: Self-pay | Admitting: Physical Therapy

## 2022-08-28 ENCOUNTER — Ambulatory Visit: Payer: BC Managed Care – PPO | Attending: Neurosurgery | Admitting: Physical Therapy

## 2022-08-28 ENCOUNTER — Telehealth: Payer: Self-pay | Admitting: Neurology

## 2022-08-28 DIAGNOSIS — M6281 Muscle weakness (generalized): Secondary | ICD-10-CM | POA: Insufficient documentation

## 2022-08-28 DIAGNOSIS — R2681 Unsteadiness on feet: Secondary | ICD-10-CM | POA: Insufficient documentation

## 2022-08-28 DIAGNOSIS — R2689 Other abnormalities of gait and mobility: Secondary | ICD-10-CM | POA: Insufficient documentation

## 2022-08-28 DIAGNOSIS — R262 Difficulty in walking, not elsewhere classified: Secondary | ICD-10-CM | POA: Insufficient documentation

## 2022-08-28 DIAGNOSIS — R531 Weakness: Secondary | ICD-10-CM

## 2022-08-28 DIAGNOSIS — M542 Cervicalgia: Secondary | ICD-10-CM

## 2022-08-28 NOTE — Therapy (Signed)
OUTPATIENT PHYSICAL THERAPY TREATMENT   Patient Name: Alex West MRN: [id number] DOB:06/18/1956, 66 y.o., male Today's Date: 08/28/2022    PT End of Session - 08/28/22 0851     Visit Number 3    Date for PT Re-Evaluation 09/30/22    Authorization Type BC[employer] - VL: 25 (7 used)    PT Start Time 0851    PT Stop Time 0935    PT Time Calculation (min) 44 min    Activity Tolerance Patient tolerated treatment well    Behavior During Therapy WFL for tasks assessed/performed               Past Medical History:  Diagnosis Date   Arthritis    BPH (benign prostatic hyperplasia)    Colon polyp    ED (erectile dysfunction)    Elevated liver enzymes    Gallstones    Hepatitis    History of anemia    Hypercholesterolemia    Hypothyroidism    Mild tricuspid regurgitation    Mitral valve regurgitation    Thyroid disease    Vertigo    Past Surgical History:  Procedure Laterality Date   ANTERIOR CERVICAL DECOMP/DISCECTOMY FUSION N/A 07/23/2022   Procedure: Anterior Cervical Discectomy and Fusion Cervical Three-Four/Four-Five/Five-Six;  Surgeon: Vallarie Mare, MD;  Location: [place];  Service: Neurosurgery;  Laterality: N/A;  3C   FLEXIBLE SIGMOIDOSCOPY     Patient Active Problem List   Diagnosis Date Noted   Paresthesia 08/21/2022   Stenosis of cervical spine with myelopathy ([place]) 07/23/2022   Gait abnormality 06/18/2022   Cervical myelopathy ([place]) 06/17/2022   Weakness 06/06/2022   Neck pain 06/06/2022   OA (osteoarthritis) of hip 07/24/2021   Immunosuppressed status ([place]) 09/05/2014   Hepatitis, autoimmune ([place]) 09/05/2014    PCP: Lawerance Cruel, MD  REFERRING PROVIDER: Vallarie Mare, MD  REFERRING DIAG: [phone] (ICD-10-CM) - Cervical spondylosis with myelopathy  THERAPY DIAG:  Other abnormalities of gait and mobility  Muscle weakness (generalized)  Difficulty in walking, not elsewhere classified  Unsteadiness on feet  RATIONALE FOR  EVALUATION AND TREATMENT: Rehabilitation  ONSET DATE: 07/23/2022 - ACDF C3-4, C4-5, C5-6   NEXT MD VISIT: 09/06/22 - surgery f/u  SUBJECTIVE:   SUBJECTIVE STATEMENT: Pt forgot his neck brace in the car having taken it off to drive. Legs still feel like sand bags.  PT orders: PT for ambulation and extremity strength, no neck ROM exercises  PAIN:  Are you having pain? No  PERTINENT HISTORY: ACDF C3-4, C4-5, C5-6 on 07/23/22; L hip pain - severe OA; arthiritis; BPH; anemia; hypothyroidism; MVR & TVR; thyroid disease; vertigo  PRECAUTIONS: Cervical - cervical collar & no neck ROM exercises  WEIGHT BEARING RESTRICTIONS: No  FALLS:  Has patient fallen in last 6 months? No  LIVING ENVIRONMENT: Lives with: lives with their spouse Lives in: House/apartment Stairs: Yes: Internal: 14 steps; on right going up and External: 3 steps; on left going up Has following equipment at home: Single point cane and Walker - 2 wheeled  OCCUPATION: Retired  PLOF: Independent and Leisure: play golf, gym 5x/wk   PATIENT GOALS: "I want to get back to walking normally again."   OBJECTIVE:   DIAGNOSTIC FINDINGS:  06/13/22 - Cervical MRI:  IMPRESSION: This MRI of the cervical spine without contrast shows the following: Increase signal within the spinal cord adjacent to C3-C4 consistent with compressive myelopathy. At C3-C4 and C5-C6 there is severe spinal stenosis, at C4-C5 there is moderate spinal stenosis  and at C2-C3, C6-C7 and C7-T1 there is mild spinal stenosis. There are various degrees of foraminal narrowing at every level in the cervical spine.  This is severe at C3-C4 where there is probable bilateral C4 nerve root compression, and moderately severe at C4-C5 and C5-C6 where there is potential for bilateral C5 and C6 nerve root compression.  PATIENT SURVEYS:  NDI  23/50 = 46.0%, moderate disability LEFS 10/80 = 12.5%, 87.5% disability  COGNITION: Overall cognitive status: Within functional  limits for tasks assessed     SENSATION: Light touch: Impaired  Intermittent numbness in R thumb and B hands, burning and tingling in L shoulder and UE  MUSCLE LENGTH: Hamstrings: mild/mod tight L>R ITB: mod tight B Piriformis: mod/severe tight L, mod tight R Hip flexors: mod tight L>R Quads: mod tight L>R  POSTURE:  No Significant postural limitations and cervical collar in place  UPPER EXTREMITY ROM: Deferred on eval d/t pt reported post-op precautions  Active ROM Right eval Left eval  Shoulder flexion    Shoulder extension    Shoulder abduction    Shoulder adduction    Shoulder internal rotation    Shoulder external rotation    Elbow flexion    Elbow extension    Wrist flexion    Wrist extension    Wrist ulnar deviation    Wrist radial deviation    Wrist pronation    Wrist supination    (Blank rows = not tested)  UPPER EXTREMITY MMT:  Deferred on eval d/t pt reported post-op precautions  MMT Right eval Left eval  Shoulder flexion    Shoulder extension    Shoulder abduction    Shoulder adduction    Shoulder internal rotation    Shoulder external rotation    Middle trapezius    Lower trapezius    Elbow flexion    Elbow extension    Wrist flexion    Wrist extension    Wrist ulnar deviation    Wrist radial deviation    Wrist pronation    Wrist supination    Grip strength (lbs)    (Blank rows = not tested)  LOWER EXTREMITY ROM: B hip ROM mild to moderately limited in all planes with greatest limitation in L hip flexion and IR/ER  LOWER EXTREMITY MMT: Tested in sitting  MMT Right eval Left eval  Hip flexion 4+ 4  Hip extension 4 4+  Hip abduction 4+ 4  Hip adduction 4+ 4+  Hip internal rotation 4 4  Hip external rotation 4 4  Knee flexion 4 4  Knee extension 4+ 4+  Ankle dorsiflexion 4 4+  Ankle plantarflexion    Ankle inversion    Ankle eversion     (Blank rows = not tested)  BED MOBILITY:  NT  TRANSFERS: Assistive device utilized:  None  Sit to stand: Modified independence Stand to sit: Modified independence Chair to chair:  NT Floor:  NT  GAIT: Distance walked: 60 ft Assistive device utilized: Single point cane Level of assistance: Modified independence Gait pattern: step through pattern, decreased stride length, decreased hip/knee flexion- Right, decreased hip/knee flexion- Left, lateral hip instability, and decreased trunk rotation Comments: 1.89 ft/sec gait speed = limited community ambulator  RAMP: Level of Assistance:  NT Assistive device utilized:  NT Ramp Comments:   CURB:  Level of Assistance:  NT Assistive device utilized:  NT Curb Comments:   STAIRS:  Level of Assistance: SBA  Stair Negotiation Technique: Step to Pattern with Single Rail on  Right  Number of Stairs: 14   Height of Stairs: 7  Comments: leads with R LE  FUNCTIONAL TESTS:  5 times sit to stand: 16.63 sec; >15 sec = recurrent fall risk Timed up and go (TUG): 23.93 sec w/o AD, 17.03 sec with SPC; >13.5 sec = high fall risk 10 meter walk test: 17.38 sec with SPC - gait speed = 1.89 ft/sec; ,1.8 ft/sec = risk for recurrent falls Dynamic Gait Index: 12/24 - Scores of 19 or less are predictive of falls in older community living adults   TODAY'S TREATMENT:  08/28/22 THERAPEUTIC EXERCISE: to improve flexibility, strength and mobility.  Verbal and tactile cues throughout for technique. Standing alt B hip ABD with looped RTB at ankles x 10, cues to maintain upright posture - UE support on counter Standing alt B hip extension with looped RTB at ankles x 10, cues to maintain upright posture - UE support on counter Standing alt B hip flexion march with looped RTB at midfoot x 10, cues to maintain upright posture - UE support on counter Standing R/L HS curl with looped RTB at ankles x 10, cues to maintain upright posture - UE support on counter BATCA B knee flexion 35# 2 x 10 - cues for slow pace and good eccentric control BATCA B con & L/R  single leg eccentric knee flexion 25# 2 x 10 - cues for slow pace and good eccentric control BATCA B knee extension 25# 3 x 10 - cues for slow pace and good eccentric control   08/21/22 TE: Bike L2x36mn Gait 450 ft; 2 lap w/o cane, 4 laps with cane Seated piriformis stretch x 30 sec R LE Seated hamstring stretch x 30 sec Supine figure 4 stretch and KTOS x 30" each Supine SKTC x 30 sec Seated LAQ x 10 Seated march x 10 STS x 10 - cues not to brace onto chair with thigh   08/19/22 Eval only   PATIENT EDUCATION:  Education details:  cervical precautions Person educated: Patient Education method: Explanation Education comprehension: verbalized understanding  HOME EXERCISE PROGRAM: Access Code: [email or web address]: [email or web address] Date: 08/28/2022 Prepared by: JAnnie Paras Exercises - Seated Table Hamstring Stretch  - 2 x daily - 7 x weekly - 2 sets - 30 sec hold - Supine Figure 4 Piriformis Stretch  - 2 x daily - 7 x weekly - 2 sets - 30 sec hold - Supine Piriformis Stretch with Foot on Ground  - 2 x daily - 7 x weekly - 2 sets - 30 sec hold - Sit to Stand  - 1 x daily - 3 x weekly - 3 sets - 10 reps - Standing Hip Abduction with Resistance at Ankles and Counter Support  - 1 x daily - 3 x weekly - 2 sets - 10 reps - 3 sec hold - Standing Hip Extension with Resistance at Ankles and Counter Support  - 1 x daily - 3 x weekly - 2 sets - 10 reps - 3 sec hold - Marching with Resistance  - 1 x daily - 3 x weekly - 2 sets - 10 reps - 3 sec hold - Standing Hamstring Curl with Resistance  - 1 x daily - 3 x weekly - 2 sets - 10 reps - 3 sec hold  ASSESSMENT:  CLINICAL IMPRESSION: VTimothy"[name]" reports no concerns with HEP and denies need for review today. Progressed hip and knee strengthening with close monitoring for postural alignment, maintaining neutral cervical and thoracolumbar spine and avoiding  upper body stress as pt w/o cervical collar brace today. Reinforced  need for continued full-time use of cervical collar until cleared by MD to discontinue use.   OBJECTIVE IMPAIRMENTS: Abnormal gait, decreased activity tolerance, decreased balance, decreased endurance, decreased mobility, difficulty walking, decreased strength, decreased safety awareness, increased fascial restrictions, impaired perceived functional ability, increased muscle spasms, impaired flexibility, impaired sensation, improper body mechanics, postural dysfunction, and pain.   ACTIVITY LIMITATIONS: carrying, lifting, bending, sitting, standing, squatting, sleeping, stairs, transfers, bed mobility, bathing, toileting, dressing, reach over head, hygiene/grooming, locomotion level, and caring for others  PARTICIPATION LIMITATIONS: meal prep, cleaning, laundry, driving, community activity, and yard work  PERSONAL FACTORS: Fitness, Past/current experiences, Time since onset of injury/illness/exacerbation, Transportation, and 3+ comorbidities: ACDF C3-4, C4-5, C5-6 on 07/23/22; L hip pain - severe OA; arthiritis; BPH; anemia; hypothyroidism; MVR & TVR; thyroid disease; vertigo  are also affecting patient's functional outcome.   REHAB POTENTIAL: Good  CLINICAL DECISION MAKING: Evolving/moderate complexity  EVALUATION COMPLEXITY: Moderate   GOALS: Goals reviewed with patient? Yes  SHORT TERM GOALS: Target date: 09/09/2022   Patient will be independent with initial HEP. Baseline:  Goal status: IN PROGRESS  2.  Patient will improve 5x STS time to </= 15 seconds for improved efficiency and safety with transfers Baseline: 16.63 sec Goal status: IN PROGRESS  LONG TERM GOALS: Target date: 09/30/2022   Patient will be independent with advanced/ongoing HEP to improve outcomes and carryover.  Baseline:  Goal status: IN PROGRESS  2.  Patient will demonstrate improved B LE strength to >/= 4+/5 for improved stability and ease of mobility. Baseline:  Goal status: IN PROGRESS  3.  Patient will be  able to ambulate 600' with LRAD and normal gait pattern across various surfaces to access community.  Baseline:  Goal status: IN PROGRESS  4.  Patient will improve gait velocity to at least 2.62 ft/sec for improved gait efficiency and safety as a community ambulator. Baseline:  Goal status: IN PROGRESS  5. Patient will be able to ascend/descend stairs with 1 HR and reciprocal step pattern safely to access home and community.  Baseline:  Goal status: IN PROGRESS  6.  Patient will report 20/80 on LEFS to demonstrate improved functional ability. Baseline: LEFS 10/80 = 12.5% (87.5% disability) Goal status: IN PROGRESS  7.  Patient will demonstrate decreased TUG time to </= 13.5 sec to decrease risk for falls with transitional mobility Baseline: 23.93 sec w/o AD, 17.03 sec with SPC Goal status: IN PROGRESS  9.  Patient will demonstrate at least 19/24 on DGI to decrease risk of falls. Baseline: DGI = 12/24 Goal status: IN PROGRESS     PLAN: PT FREQUENCY: 2x/week  PT DURATION: 6 weeks  PLANNED INTERVENTIONS: Therapeutic exercises, Therapeutic activity, Neuromuscular re-education, Balance training, Gait training, Patient/Family education, Self Care, Joint mobilization, Stair training, DME instructions, Dry Needling, Electrical stimulation, Cryotherapy, Moist heat, Taping, Ultrasound, Ionotophoresis '4mg'$ /ml Dexamethasone, Manual therapy, and Re-evaluation  PLAN FOR NEXT SESSION: hip flexibility, LE strengthening and balance/gait stability    Percival Spanish, PT 08/28/2022, 10:49 AM

## 2022-08-28 NOTE — Telephone Encounter (Signed)
MRI thoracic wo contrast & MRI lumbar wo contrast scheduled at [facility] for 09/04/22 at 10:00 am.   BCBS auth: [id number] (08/28/22-09/26/22)

## 2022-09-03 ENCOUNTER — Ambulatory Visit: Payer: BC Managed Care – PPO | Admitting: Physical Therapy

## 2022-09-03 ENCOUNTER — Encounter: Payer: Self-pay | Admitting: Physical Therapy

## 2022-09-03 DIAGNOSIS — R2689 Other abnormalities of gait and mobility: Secondary | ICD-10-CM

## 2022-09-03 DIAGNOSIS — R2681 Unsteadiness on feet: Secondary | ICD-10-CM

## 2022-09-03 DIAGNOSIS — R262 Difficulty in walking, not elsewhere classified: Secondary | ICD-10-CM

## 2022-09-03 DIAGNOSIS — M6281 Muscle weakness (generalized): Secondary | ICD-10-CM

## 2022-09-03 NOTE — Therapy (Signed)
OUTPATIENT PHYSICAL THERAPY TREATMENT / PROGRESS NOTE   Patient Name: Braydon Kullman MRN: [id number] DOB:Jun 10, 1956, 66 y.o., male Today's Date: 09/03/2022  Progress Note  Reporting Period 08/19/2022 to 09/03/2022  See note below for Objective Data and Assessment of Progress/Goals.      PT End of Session - 09/03/22 1015     Visit Number 4    Date for PT Re-Evaluation 09/30/22    Authorization Type [employer] - VL: 60 (7 used)    Progress Note Due on Visit 18   MD PN on visit #4 - 09/03/22   PT Start Time 4163    PT Stop Time 1101    PT Time Calculation (min) 46 min    Activity Tolerance Patient tolerated treatment well    Behavior During Therapy WFL for tasks assessed/performed                Past Medical History:  Diagnosis Date   Arthritis    BPH (benign prostatic hyperplasia)    Colon polyp    ED (erectile dysfunction)    Elevated liver enzymes    Gallstones    Hepatitis    History of anemia    Hypercholesterolemia    Hypothyroidism    Mild tricuspid regurgitation    Mitral valve regurgitation    Thyroid disease    Vertigo    Past Surgical History:  Procedure Laterality Date   ANTERIOR CERVICAL DECOMP/DISCECTOMY FUSION N/A 07/23/2022   Procedure: Anterior Cervical Discectomy and Fusion Cervical Three-Four/Four-Five/Five-Six;  Surgeon: Vallarie Mare, MD;  Location: [facility];  Service: Neurosurgery;  Laterality: N/A;  3C   FLEXIBLE SIGMOIDOSCOPY     Patient Active Problem List   Diagnosis Date Noted   Paresthesia 08/21/2022   Stenosis of cervical spine with myelopathy ([place]) 07/23/2022   Gait abnormality 06/18/2022   Cervical myelopathy ([place]) 06/17/2022   Weakness 06/06/2022   Neck pain 06/06/2022   OA (osteoarthritis) of hip 07/24/2021   Immunosuppressed status ([place]) 09/05/2014   Hepatitis, autoimmune ([place]) 09/05/2014    PCP: Lawerance Cruel, MD  REFERRING PROVIDER: Vallarie Mare, MD  REFERRING DIAG: [phone] (ICD-10-CM) - Cervical  spondylosis with myelopathy  THERAPY DIAG:  Other abnormalities of gait and mobility  Muscle weakness (generalized)  Difficulty in walking, not elsewhere classified  Unsteadiness on feet  RATIONALE FOR EVALUATION AND TREATMENT: Rehabilitation  ONSET DATE: 07/23/2022 - ACDF C3-4, C4-5, C5-6   NEXT MD VISIT: 09/06/22 - surgery f/u  SUBJECTIVE:   SUBJECTIVE STATEMENT: "Today is a terrible day - my legs feel like they have 20# bags attached". He reports his L hip has been acting up more, so he has an appt with Dr. Raeford Razor on 09/09/22 to see about an injection for the hip. He also notes compensating for the hip is starting to bother his back.  PT orders: PT for ambulation and extremity strength, no neck ROM exercises  PAIN:  Are you having pain? Yes: NPRS scale:  8/10 Pain location: L hip Pain description: dull Aggravating factors: certain movements Relieving factors: heat & ice  PERTINENT HISTORY: ACDF C3-4, C4-5, C5-6 on 07/23/22; L hip pain - severe OA; arthiritis; BPH; anemia; hypothyroidism; MVR & TVR; thyroid disease; vertigo  PRECAUTIONS: Cervical - cervical collar & no neck ROM exercises  WEIGHT BEARING RESTRICTIONS: No  FALLS:  Has patient fallen in last 6 months? No  LIVING ENVIRONMENT: Lives with: lives with their spouse Lives in: House/apartment Stairs: Yes: Internal: 14 steps; on right going up and  External: 3 steps; on left going up Has following equipment at home: Single point cane and Walker - 2 wheeled  OCCUPATION: Retired  PLOF: Independent and Leisure: play golf, gym 5x/wk   PATIENT GOALS: "I want to get back to walking normally again."   OBJECTIVE:   DIAGNOSTIC FINDINGS:  06/13/22 - Cervical MRI:  IMPRESSION: This MRI of the cervical spine without contrast shows the following: Increase signal within the spinal cord adjacent to C3-C4 consistent with compressive myelopathy. At C3-C4 and C5-C6 there is severe spinal stenosis, at C4-C5 there is moderate  spinal stenosis and at C2-C3, C6-C7 and C7-T1 there is mild spinal stenosis. There are various degrees of foraminal narrowing at every level in the cervical spine.  This is severe at C3-C4 where there is probable bilateral C4 nerve root compression, and moderately severe at C4-C5 and C5-C6 where there is potential for bilateral C5 and C6 nerve root compression.  PATIENT SURVEYS:  NDI  23/50 = 46.0%, moderate disability LEFS 10/80 = 12.5%, 87.5% disability  COGNITION: Overall cognitive status: Within functional limits for tasks assessed     SENSATION: Light touch: Impaired  Intermittent numbness in R thumb and B hands, burning and tingling in L shoulder and UE  MUSCLE LENGTH: Hamstrings: mild/mod tight L>R ITB: mod tight B Piriformis: mod/severe tight L, mod tight R Hip flexors: mod tight L>R Quads: mod tight L>R  POSTURE:  No Significant postural limitations and cervical collar in place  UPPER EXTREMITY ROM: Deferred on eval d/t pt reported post-op precautions  Active ROM Right eval Left eval  Shoulder flexion    Shoulder extension    Shoulder abduction    Shoulder adduction    Shoulder internal rotation    Shoulder external rotation    Elbow flexion    Elbow extension    Wrist flexion    Wrist extension    Wrist ulnar deviation    Wrist radial deviation    Wrist pronation    Wrist supination    (Blank rows = not tested)  UPPER EXTREMITY MMT:  Deferred on eval d/t pt reported post-op precautions  MMT Right eval Left eval  Shoulder flexion    Shoulder extension    Shoulder abduction    Shoulder adduction    Shoulder internal rotation    Shoulder external rotation    Middle trapezius    Lower trapezius    Elbow flexion    Elbow extension    Wrist flexion    Wrist extension    Wrist ulnar deviation    Wrist radial deviation    Wrist pronation    Wrist supination    Grip strength (lbs)    (Blank rows = not tested)  LOWER EXTREMITY ROM: B hip ROM mild  to moderately limited in all planes with greatest limitation in L hip flexion and IR/ER  LOWER EXTREMITY MMT: Tested in sitting on eval; *standard testing positions except extension tested in sidelying on 09/03/22  MMT Right eval Left eval Right 09/03/22 Left 09/03/22  Hip flexion 4+ 4 5 4+  Hip extension 4 4+ 4+ * 4 *  Hip abduction 4+ 4 4+ 4  Hip adduction 4+ 4+ 5 4+  Hip internal rotation 4 4 4+ 4+  Hip external rotation 4 4 4+ 4+  Knee flexion _0 4+  Knee extension 4+ 4+ 5 5  Ankle dorsiflexion 4 4+ 4+ 4+  Ankle plantarflexion   5 5-  Ankle inversion  Ankle eversion       (Blank rows = not tested)  BED MOBILITY:  NT  TRANSFERS: Assistive device utilized: None  Sit to stand: Modified independence Stand to sit: Modified independence Chair to chair:  NT Floor:  NT  GAIT: Distance walked: 60 ft Assistive device utilized: Single point cane Level of assistance: Modified independence Gait pattern: step through pattern, decreased stride length, decreased hip/knee flexion- Right, decreased hip/knee flexion- Left, lateral hip instability, and decreased trunk rotation Comments: 1.89 ft/sec gait speed = limited community ambulator  RAMP: Level of Assistance:  NT Assistive device utilized:  NT Ramp Comments:   CURB:  Level of Assistance:  NT Assistive device utilized:  NT Curb Comments:   STAIRS:  Level of Assistance: SBA  Stair Negotiation Technique: Step to Pattern with Single Rail on Right  Number of Stairs: 14   Height of Stairs: 7  Comments: leads with R LE  FUNCTIONAL TESTS:  5 times sit to stand: 16.63 sec; >15 sec = recurrent fall risk Timed up and go (TUG): 23.93 sec w/o AD, 17.03 sec with SPC; >13.5 sec = high fall risk 10 meter walk test: 17.38 sec with SPC - gait speed = 1.89 ft/sec; ,1.8 ft/sec = risk for recurrent falls Dynamic Gait Index: 12/24 - Scores of 19 or less are predictive of falls in older community living adults   TODAY'S  TREATMENT:  09/03/22 THERAPEUTIC EXERCISE: to improve flexibility, strength and mobility.  Verbal and tactile cues throughout for technique. NuStep - L5 x 7 min (LE only)  THERAPEUTIC ACTIVITIES: 5xSTS = 14.50 sec TUG = 12.94 sec w/o AD; 10.85 sec with SPC 10MWT = 10.79 sec with and w/o SPC  Gait speed = 3.04 ft/sec  MMT  Goal assessment   08/28/22 THERAPEUTIC EXERCISE: to improve flexibility, strength and mobility.  Verbal and tactile cues throughout for technique. Standing alt B hip ABD with looped RTB at ankles x 10, cues to maintain upright posture - UE support on counter Standing alt B hip extension with looped RTB at ankles x 10, cues to maintain upright posture - UE support on counter Standing alt B hip flexion march with looped RTB at midfoot x 10, cues to maintain upright posture - UE support on counter Standing R/L HS curl with looped RTB at ankles x 10, cues to maintain upright posture - UE support on counter BATCA B knee flexion 35# 2 x 10 - cues for slow pace and good eccentric control BATCA B con & L/R single leg eccentric knee flexion 25# 2 x 10 - cues for slow pace and good eccentric control BATCA B knee extension 25# 3 x 10 - cues for slow pace and good eccentric control   08/21/22 TE: Bike L2x74min Gait 450 ft; 2 lap w/o cane, 4 laps with cane Seated piriformis stretch x 30 sec R LE Seated hamstring stretch x 30 sec Supine figure 4 stretch and KTOS x 30" each Supine SKTC x 30 sec Seated LAQ x 10 Seated march x 10 STS x 10 - cues not to brace onto chair with thigh   PATIENT EDUCATION:  Education details:  cervical precautions Person educated: Patient Education method: Explanation Education comprehension: verbalized understanding  HOME EXERCISE PROGRAM: Access Code: [email or web address] URL: [email or web address] Date: 08/28/2022 Prepared by: Annie Paras  Exercises - Seated Table Hamstring Stretch  - 2 x daily - 7 x weekly - 2 sets - 30 sec hold -  Supine Figure 4 Piriformis Stretch  - 2 x  daily - 7 x weekly - 2 sets - 30 sec hold - Supine Piriformis Stretch with Foot on Ground  - 2 x daily - 7 x weekly - 2 sets - 30 sec hold - Sit to Stand  - 1 x daily - 3 x weekly - 3 sets - 10 reps - Standing Hip Abduction with Resistance at Ankles and Counter Support  - 1 x daily - 3 x weekly - 2 sets - 10 reps - 3 sec hold - Standing Hip Extension with Resistance at Ankles and Counter Support  - 1 x daily - 3 x weekly - 2 sets - 10 reps - 3 sec hold - Marching with Resistance  - 1 x daily - 3 x weekly - 2 sets - 10 reps - 3 sec hold - Standing Hamstring Curl with Resistance  - 1 x daily - 3 x weekly - 2 sets - 10 reps - 3 sec hold  ASSESSMENT:  CLINICAL IMPRESSION: Giann "[name]" reports he is trying to "rehab" every day - walking, using his home gym, and doing the HEP (STG #1 met). He notes the advanced L hip OA continues to limit his mobility, therefore he has an appt with Dr. Raeford Razor on 09/09/22 to see about another hip injection. He also notes continued feeling of heaviness in his legs but overall B LE strength improved per MMT with majority of muscle grades 4+/5 to 5/5 (only exception being L hip d/t OA and associated pain). Improvements also noted on all standardized balance testing with goals met for 5xSTS, TUG and gait speed. [name] is progressing well with PT but feels that the L hip OA limits him when it comes to walking and some exercises - he was inquiring about possible of aquatic PT to help reduce the stress on his hip while exercising. Encouraged him to inquire about clearance from MD to include aquatic PT and if cleared by MD, will plan for hybrid therapy with 1 visit each land- and aquatic-based per week.  OBJECTIVE IMPAIRMENTS: Abnormal gait, decreased activity tolerance, decreased balance, decreased endurance, decreased mobility, difficulty walking, decreased strength, decreased safety awareness, increased fascial restrictions, impaired  perceived functional ability, increased muscle spasms, impaired flexibility, impaired sensation, improper body mechanics, postural dysfunction, and pain.   ACTIVITY LIMITATIONS: carrying, lifting, bending, sitting, standing, squatting, sleeping, stairs, transfers, bed mobility, bathing, toileting, dressing, reach over head, hygiene/grooming, locomotion level, and caring for others  PARTICIPATION LIMITATIONS: meal prep, cleaning, laundry, driving, community activity, and yard work  PERSONAL FACTORS: Fitness, Past/current experiences, Time since onset of injury/illness/exacerbation, Transportation, and 3+ comorbidities: ACDF C3-4, C4-5, C5-6 on 07/23/22; L hip pain - severe OA; arthiritis; BPH; anemia; hypothyroidism; MVR & TVR; thyroid disease; vertigo  are also affecting patient's functional outcome.   REHAB POTENTIAL: Good  CLINICAL DECISION MAKING: Evolving/moderate complexity  EVALUATION COMPLEXITY: Moderate   GOALS: Goals reviewed with patient? Yes  SHORT TERM GOALS: Target date: 09/09/2022   Patient will be independent with initial HEP. Baseline:  Goal status: MET  09/03/22  2.  Patient will improve 5x STS time to </= 15 seconds for improved efficiency and safety with transfers Baseline: 16.63 sec Goal status: MET  09/04/11 - 5xSTS = 14.50 sec  LONG TERM GOALS: Target date: 09/30/2022   Patient will be independent with advanced/ongoing HEP to improve outcomes and carryover.  Baseline:  Goal status: IN PROGRESS  2.  Patient will demonstrate improved B LE strength to >/= 4+/5 for improved stability and ease of mobility.  Baseline:  Goal status: PARTIALLY MET  09/03/22 - Refer to above MMT table - Met for all but L hip (advanced OA)  3.  Patient will be able to ambulate 600' with LRAD and normal gait pattern across various surfaces to access community.  Baseline:  Goal status: IN PROGRESS  4.  Patient will improve gait velocity to at least 2.62 ft/sec for improved gait efficiency  and safety as a community ambulator. Baseline:  Goal status: MET  09/03/22 - Gait speed = 3.04 ft/sec with and w/o SPC  5. Patient will be able to ascend/descend stairs with 1 HR and reciprocal step pattern safely to access home and community.  Baseline:  Goal status: IN PROGRESS  6.  Patient will report 20/80 on LEFS to demonstrate improved functional ability. Baseline: LEFS 10/80 = 12.5% (87.5% disability) Goal status: IN PROGRESS  7.  Patient will demonstrate decreased TUG time to </= 13.5 sec to decrease risk for falls with transitional mobility Baseline: 23.93 sec w/o AD, 17.03 sec with SPC Goal status: MET  09/03/22 - TUG = 12.94 sec w/o AD; 10.85 sec with SPC  9.  Patient will demonstrate at least 19/24 on DGI to decrease risk of falls. Baseline: DGI = 12/24 Goal status: IN PROGRESS     PLAN: PT FREQUENCY: 2x/week  PT DURATION: 6 weeks  PLANNED INTERVENTIONS: Therapeutic exercises, Therapeutic activity, Neuromuscular re-education, Balance training, Gait training, Patient/Family education, Self Care, Joint mobilization, Stair training, DME instructions, Dry Needling, Electrical stimulation, Cryotherapy, Moist heat, Taping, Ultrasound, Ionotophoresis 52m/ml Dexamethasone, Manual therapy, and Re-evaluation  PLAN FOR NEXT SESSION: hip flexibility, LE strengthening and balance/gait stability    JPercival Spanish PT 09/03/2022, 11:22 AM

## 2022-09-04 ENCOUNTER — Ambulatory Visit: Payer: BC Managed Care – PPO

## 2022-09-05 DIAGNOSIS — D849 Immunodeficiency, unspecified: Secondary | ICD-10-CM | POA: Diagnosis not present

## 2022-09-05 DIAGNOSIS — K760 Fatty (change of) liver, not elsewhere classified: Secondary | ICD-10-CM | POA: Diagnosis not present

## 2022-09-05 DIAGNOSIS — K7401 Hepatic fibrosis, early fibrosis: Secondary | ICD-10-CM | POA: Diagnosis not present

## 2022-09-05 DIAGNOSIS — K754 Autoimmune hepatitis: Secondary | ICD-10-CM | POA: Diagnosis not present

## 2022-09-06 ENCOUNTER — Encounter: Payer: BC Managed Care – PPO | Admitting: Physical Therapy

## 2022-09-06 DIAGNOSIS — M4712 Other spondylosis with myelopathy, cervical region: Secondary | ICD-10-CM | POA: Diagnosis not present

## 2022-09-09 ENCOUNTER — Ambulatory Visit: Payer: Self-pay

## 2022-09-09 ENCOUNTER — Ambulatory Visit: Payer: BC Managed Care – PPO

## 2022-09-09 ENCOUNTER — Encounter: Payer: Self-pay | Admitting: Family Medicine

## 2022-09-09 ENCOUNTER — Ambulatory Visit (INDEPENDENT_AMBULATORY_CARE_PROVIDER_SITE_OTHER): Payer: BC Managed Care – PPO | Admitting: Family Medicine

## 2022-09-09 VITALS — BP 92/57 | Ht 75.0 in | Wt 243.0 lb

## 2022-09-09 DIAGNOSIS — M1612 Unilateral primary osteoarthritis, left hip: Secondary | ICD-10-CM

## 2022-09-09 MED ORDER — TRIAMCINOLONE ACETONIDE 32 MG IX SRER
32.0000 mg | Freq: Once | INTRA_ARTICULAR | Status: AC
  Administered 2022-09-09: 32 mg via INTRA_ARTICULAR

## 2022-09-09 NOTE — Assessment & Plan Note (Signed)
Completed zilretta injection.  ?

## 2022-09-09 NOTE — Patient Instructions (Signed)
Good to see you ?Please use ice as needed  ?Please send me a message in MyChart with any questions or updates.  ?Please see me back in 3 months.  ? ?--Dr. Carolyna Yerian ? ?

## 2022-09-09 NOTE — Progress Notes (Signed)
  Alex West - 66 y.o. male MRN [id number]  Date of birth: 1956-10-25  SUBJECTIVE:  Including CC & ROS.  No chief complaint on file.   Kreston Ahrendt is a 66 y.o. male that is  here for a hip injection.   Review of Systems See HPI   HISTORY: Past Medical, Surgical, Social, and Family History Reviewed & Updated per EMR.   Pertinent Historical Findings include:  Past Medical History:  Diagnosis Date   Arthritis    BPH (benign prostatic hyperplasia)    Colon polyp    ED (erectile dysfunction)    Elevated liver enzymes    Gallstones    Hepatitis    History of anemia    Hypercholesterolemia    Hypothyroidism    Mild tricuspid regurgitation    Mitral valve regurgitation    Thyroid disease    Vertigo     Past Surgical History:  Procedure Laterality Date   ANTERIOR CERVICAL DECOMP/DISCECTOMY FUSION N/A 07/23/2022   Procedure: Anterior Cervical Discectomy and Fusion Cervical Three-Four/Four-Five/Five-Six;  Surgeon: Vallarie Mare, MD;  Location: [facility];  Service: Neurosurgery;  Laterality: N/A;  3C   FLEXIBLE SIGMOIDOSCOPY       PHYSICAL EXAM:  VS: BP (!) 92/57 (BP Location: Right Arm, Patient Position: Sitting)   Ht '6\' 3"'$  (1.905 m)   Wt 243 lb (110.2 kg)   BMI 30.37 kg/m  Physical Exam Gen: NAD, alert, cooperative with exam, well-appearing MSK:  Neurovascularly intact     Aspiration/Injection Procedure Note Caylen Yardley Feb 24, 1956  Procedure: Injection Indications: left hip pain  Procedure Details Consent: Risks of procedure as well as the alternatives and risks of each were explained to the (patient/caregiver).  Consent for procedure obtained. Time Out: Verified patient identification, verified procedure, site/side was marked, verified correct patient position, special equipment/implants available, medications/allergies/relevent history reviewed, required imaging and test results available.  Performed.  The area was cleaned with iodine and  alcohol swabs.    The left hip joint knee superior lateral suprapatellar pouch was injected using 3 cc of 1% lidocaine on a 25-gauge 1-1/2 inch needle.  An 18-gauge 1-1/2 needle was used to achieve aspiration.  The syringe was switched and a mixture containing 5 cc's of 32 mg Zilretta and 4 cc's of 0.25% bupivacaine was injected.  Ultrasound was used. Images were obtained in long views showing the injection.    A sterile dressing was applied.  Patient did tolerate procedure well.    ASSESSMENT & PLAN:   OA (osteoarthritis) of hip Completed zilretta injection.

## 2022-09-10 NOTE — Telephone Encounter (Signed)
Dr. Marcello Moores at [facility] is requesting we add a MRI cervical spine wo when this patient comes in for lumbar and thoracic. Can you put in the order please? Thank you

## 2022-09-10 NOTE — Telephone Encounter (Signed)
Orders Placed This Encounter  Procedures  . MR CERVICAL SPINE WO CONTRAST    

## 2022-09-10 NOTE — Addendum Note (Signed)
Addended by: Marcial Pacas on: 09/10/2022 02:36 PM   Modules accepted: Orders

## 2022-09-11 ENCOUNTER — Encounter: Payer: Self-pay | Admitting: Physical Therapy

## 2022-09-11 ENCOUNTER — Ambulatory Visit: Payer: BC Managed Care – PPO | Admitting: Physical Therapy

## 2022-09-11 DIAGNOSIS — R2681 Unsteadiness on feet: Secondary | ICD-10-CM

## 2022-09-11 DIAGNOSIS — R262 Difficulty in walking, not elsewhere classified: Secondary | ICD-10-CM | POA: Diagnosis not present

## 2022-09-11 DIAGNOSIS — R2689 Other abnormalities of gait and mobility: Secondary | ICD-10-CM | POA: Diagnosis not present

## 2022-09-11 DIAGNOSIS — M6281 Muscle weakness (generalized): Secondary | ICD-10-CM

## 2022-09-11 NOTE — Telephone Encounter (Signed)
MRI thoracic wo contrast, MRI lumbar wo contrast, and cervical spine wo contrast scheduled at [facility] for 09/18/22 at 10:00 am.

## 2022-09-11 NOTE — Therapy (Signed)
OUTPATIENT PHYSICAL THERAPY TREATMENT   Patient Name: Masayuki Sakai MRN: [id number] DOB:1956-02-15, 66 y.o., male Today's Date: 09/11/2022  .      PT End of Session - 09/11/22 0930     Visit Number 5    Date for PT Re-Evaluation 09/30/22    Authorization Type BCBS - VL: 60 (7 used)    Progress Note Due on Visit 61   MD PN on visit #4 - 09/03/22   PT Start Time 0930    PT Stop Time 1017    PT Time Calculation (min) 47 min    Activity Tolerance Patient tolerated treatment well    Behavior During Therapy WFL for tasks assessed/performed                 Past Medical History:  Diagnosis Date   Arthritis    BPH (benign prostatic hyperplasia)    Colon polyp    ED (erectile dysfunction)    Elevated liver enzymes    Gallstones    Hepatitis    History of anemia    Hypercholesterolemia    Hypothyroidism    Mild tricuspid regurgitation    Mitral valve regurgitation    Thyroid disease    Vertigo    Past Surgical History:  Procedure Laterality Date   ANTERIOR CERVICAL DECOMP/DISCECTOMY FUSION N/A 07/23/2022   Procedure: Anterior Cervical Discectomy and Fusion Cervical Three-Four/Four-Five/Five-Six;  Surgeon: Vallarie Mare, MD;  Location: [place];  Service: Neurosurgery;  Laterality: N/A;  3C   FLEXIBLE SIGMOIDOSCOPY     Patient Active Problem List   Diagnosis Date Noted   Paresthesia 08/21/2022   Stenosis of cervical spine with myelopathy ([place]) 07/23/2022   Gait abnormality 06/18/2022   Cervical myelopathy ([place]) 06/17/2022   Weakness 06/06/2022   Neck pain 06/06/2022   OA (osteoarthritis) of hip 07/24/2021   Immunosuppressed status ([place]) 09/05/2014   Hepatitis, autoimmune ([place]) 09/05/2014    PCP: Lawerance Cruel, MD  REFERRING PROVIDER: Vallarie Mare, MD  REFERRING DIAG: [phone] (ICD-10-CM) - Cervical spondylosis with myelopathy  THERAPY DIAG:  Other abnormalities of gait and mobility  Muscle weakness (generalized)  Difficulty in  walking, not elsewhere classified  Unsteadiness on feet  RATIONALE FOR EVALUATION AND TREATMENT: Rehabilitation  ONSET DATE: 07/23/2022 - ACDF C3-4, C4-5, C5-6   NEXT MD VISIT: 09/06/22 - surgery f/u  SUBJECTIVE:   SUBJECTIVE STATEMENT: Pt reports his f/u with the surgeon went well, with MD stating hardware healing in place as anticipated and he was told he could wean from the cervical collar as well as lift the post-op restrictions. MD also okay with aquatic PT. He states his L hip is really bothering him this week following the injection from [facility] on Monday - MD told him it could take up to 5 days before he noted benefit from the injection.  PAIN:  Are you having pain? Yes: NPRS scale: 10/10 Pain location: L hip Pain description: grinding in the socket Aggravating factors: certain movements Relieving factors: heat & ice  PERTINENT HISTORY: ACDF C3-4, C4-5, C5-6 on 07/23/22; L hip pain - severe OA; arthiritis; BPH; anemia; hypothyroidism; MVR & TVR; thyroid disease; vertigo  PRECAUTIONS: None   WEIGHT BEARING RESTRICTIONS: No  FALLS:  Has patient fallen in last 6 months? No  LIVING ENVIRONMENT: Lives with: lives with their spouse Lives in: House/apartment Stairs: Yes: Internal: 14 steps; on right going up and External: 3 steps; on left going up Has following equipment at home: Single point cane and  Walker - 2 wheeled  OCCUPATION: Retired  PLOF: Independent and Leisure: play golf, gym 5x/wk   PATIENT GOALS: "I want to get back to walking normally again."   OBJECTIVE:   DIAGNOSTIC FINDINGS:  06/13/22 - Cervical MRI:  IMPRESSION: This MRI of the cervical spine without contrast shows the following: Increase signal within the spinal cord adjacent to C3-C4 consistent with compressive myelopathy. At C3-C4 and C5-C6 there is severe spinal stenosis, at C4-C5 there is moderate spinal stenosis and at C2-C3, C6-C7 and C7-T1 there is mild spinal stenosis. There are various  degrees of foraminal narrowing at every level in the cervical spine.  This is severe at C3-C4 where there is probable bilateral C4 nerve root compression, and moderately severe at C4-C5 and C5-C6 where there is potential for bilateral C5 and C6 nerve root compression.  PATIENT SURVEYS:  NDI  23/50 = 46.0%, moderate disability LEFS 10/80 = 12.5%, 87.5% disability  COGNITION: Overall cognitive status: Within functional limits for tasks assessed     SENSATION: Light touch: Impaired  Intermittent numbness in R thumb and B hands, burning and tingling in L shoulder and UE  MUSCLE LENGTH: Hamstrings: mild/mod tight L>R ITB: mod tight B Piriformis: mod/severe tight L, mod tight R Hip flexors: mod tight L>R Quads: mod tight L>R  POSTURE:  No Significant postural limitations and cervical collar in place  CERVICAL ROM:   Active ROM AROM (deg) 09/11/22  Flexion 32  Extension 44  Right lateral flexion 23  Left lateral flexion 13  Right rotation 52  Left rotation 51   (Blank rows = not tested)   UPPER EXTREMITY ROM: Deferred on eval d/t pt reported post-op precautions  Active ROM Right 09/11/22 Left 09/11/22  Shoulder flexion 131 139  Shoulder extension 36 39  Shoulder abduction 157 151  Shoulder adduction    Shoulder internal rotation FIR to L2 FIR to L1  Shoulder external rotation FER - unable to reach behind FER - unable to reach behind  Elbow flexion    Elbow extension    Wrist flexion    Wrist extension    Wrist ulnar deviation    Wrist radial deviation    Wrist pronation    Wrist supination    (Blank rows = not tested)  UPPER EXTREMITY MMT:  Deferred on eval d/t pt reported post-op precautions  MMT Right 09/11/22 Left 09/11/22  Shoulder flexion 4+ 4+  Shoulder extension 4 4-  Shoulder abduction 4+ 4+  Shoulder adduction    Shoulder internal rotation 4+ 4+  Shoulder external rotation 4 4  Middle trapezius    Lower trapezius    Elbow flexion 5 4+  Elbow  extension 4+ 4+  Wrist flexion    Wrist extension    Wrist ulnar deviation    Wrist radial deviation    Wrist pronation    Wrist supination    Grip strength (lbs) 75 68  (Blank rows = not tested)  LOWER EXTREMITY ROM: B hip ROM mild to moderately limited in all planes with greatest limitation in L hip flexion and IR/ER  LOWER EXTREMITY MMT: Tested in sitting on eval; *standard testing positions except extension tested in sidelying on 09/03/22  MMT Right eval Left eval Right 09/03/22 Left 09/03/22  Hip flexion 4+ 4 5 4+  Hip extension 4 4+ 4+ * 4 *  Hip abduction 4+ 4 4+ 4  Hip adduction 4+ 4+ 5 4+  Hip internal rotation 4 4 4+ 4+  Hip external rotation  4 4 4+ 4+  Knee flexion 4 4 5  4+  Knee extension 4+ 4+ 5 5  Ankle dorsiflexion 4 4+ 4+ 4+  Ankle plantarflexion   5 5-  Ankle inversion      Ankle eversion       (Blank rows = not tested)  BED MOBILITY:  NT  TRANSFERS: Assistive device utilized: None  Sit to stand: Modified independence Stand to sit: Modified independence Chair to chair:  NT Floor:  NT  GAIT: Distance walked: 60 ft Assistive device utilized: Single point cane Level of assistance: Modified independence Gait pattern: step through pattern, decreased stride length, decreased hip/knee flexion- Right, decreased hip/knee flexion- Left, lateral hip instability, and decreased trunk rotation Comments: 1.89 ft/sec gait speed = limited community ambulator  RAMP: Level of Assistance:  NT Assistive device utilized:  NT Ramp Comments:   CURB:  Level of Assistance:  NT Assistive device utilized:  NT Curb Comments:   STAIRS:  Level of Assistance: SBA  Stair Negotiation Technique: Step to Pattern with Single Rail on Right  Number of Stairs: 14   Height of Stairs: 7  Comments: leads with R LE  FUNCTIONAL TESTS:  5 times sit to stand: 16.63 sec; >15 sec = recurrent fall risk Timed up and go (TUG): 23.93 sec w/o AD, 17.03 sec with SPC; >13.5 sec = high  fall risk 10 meter walk test: 17.38 sec with SPC - gait speed = 1.89 ft/sec; ,1.8 ft/sec = risk for recurrent falls Dynamic Gait Index: 12/24 - Scores of 19 or less are predictive of falls in older community living adults   TODAY'S TREATMENT:  09/11/22 THERAPEUTIC EXERCISE: to improve flexibility, strength and mobility.  Verbal and tactile cues throughout for technique. UBE x 6 min (3' each fwd & back) Seated GTB scap retraction row 15 x 3" Seated GTB scap retraction + shoulder extension to neutral 15 x 3" Seated GTB scap retraction + B shoulder ER 15 x 3"  THERAPEUTIC ACTIVITIES: Cervical and UE ROM assessment UE  MMT   09/03/22 THERAPEUTIC EXERCISE: to improve flexibility, strength and mobility.  Verbal and tactile cues throughout for technique. NuStep - L5 x 7 min (LE only)  THERAPEUTIC ACTIVITIES: 5xSTS = 14.50 sec TUG = 12.94 sec w/o AD; 10.85 sec with SPC 11/03/22 = 10.79 sec with and w/o SPC  Gait speed = 3.04 ft/sec  MMT  Goal assessment   08/28/22 THERAPEUTIC EXERCISE: to improve flexibility, strength and mobility.  Verbal and tactile cues throughout for technique. Standing alt B hip ABD with looped RTB at ankles x 10, cues to maintain upright posture - UE support on counter Standing alt B hip extension with looped RTB at ankles x 10, cues to maintain upright posture - UE support on counter Standing alt B hip flexion march with looped RTB at midfoot x 10, cues to maintain upright posture - UE support on counter Standing R/L HS curl with looped RTB at ankles x 10, cues to maintain upright posture - UE support on counter BATCA B knee flexion 35# 2 x 10 - cues for slow pace and good eccentric control BATCA B con & L/R single leg eccentric knee flexion 25# 2 x 10 - cues for slow pace and good eccentric control BATCA B knee extension 25# 3 x 10 - cues for slow pace and good eccentric control   PATIENT EDUCATION:  Education details: HEP update - UE postural  strengthening Person educated: Patient Education method: Explanation Education comprehension: verbalized understanding  HOME EXERCISE PROGRAM: Access Code: [email or web address] URL: [email or web address] Date: 09/11/2022 Prepared by: Annie Paras  Exercises - Seated Table Hamstring Stretch  - 2 x daily - 7 x weekly - 2 sets - 30 sec hold - Supine Figure 4 Piriformis Stretch  - 2 x daily - 7 x weekly - 2 sets - 30 sec hold - Supine Piriformis Stretch with Foot on Ground  - 2 x daily - 7 x weekly - 2 sets - 30 sec hold - Sit to Stand  - 1 x daily - 3 x weekly - 3 sets - 10 reps - Standing Hip Abduction with Resistance at Ankles and Counter Support  - 1 x daily - 3 x weekly - 2 sets - 10 reps - 3 sec hold - Standing Hip Extension with Resistance at Ankles and Counter Support  - 1 x daily - 3 x weekly - 2 sets - 10 reps - 3 sec hold - Marching with Resistance  - 1 x daily - 3 x weekly - 2 sets - 10 reps - 3 sec hold - Standing Hamstring Curl with Resistance  - 1 x daily - 3 x weekly - 2 sets - 10 reps - 3 sec hold - Standing Shoulder Row with Anchored Resistance  - 1 x daily - 3-4 x weekly - 2 sets - 10 reps - 5 sec hold - Scapular Retraction with Resistance Advanced  - 1 x daily - 3-4 x weekly - 2 sets - 10 reps - 5 sec hold - Shoulder External Rotation and Scapular Retraction with Resistance  - 1 x daily - 3 x weekly - 2 sets - 10 reps - 3-5 sec hold   ASSESSMENT:  CLINICAL IMPRESSION: Lynnae Sandhoff "[name]" reports MD states he is recovering well post-op and lifted his restrictions with clearance to wean from the cervical collar, although pt noting he still wears the collar at times when exercising to support/stabilize his neck. Assessed cervical and UE ROM as well as UE MMT with mild deficits noted, most pronounced in cervical mobility - explained to pt that cervical motion will likely remain more limited due to fusion and pt responding that motion was somewhat limited pre-op. Therapeutic  exercises focusing on postural strengthening with cues necessary to encourage upright trunk and head posture while focusing on scapular muscle engagement - exercises performed in sitting today due to increased L hip pain but demonstration provided for standing alternatives as hip pain allows. Per pt, MD okay with initiating aquatic therapy, therefore schedule modified to allow for hybrid therapy with 1 visit each land- and aquatic-based per week as availability of aquatic appointments allows.  OBJECTIVE IMPAIRMENTS: Abnormal gait, decreased activity tolerance, decreased balance, decreased endurance, decreased mobility, difficulty walking, decreased strength, decreased safety awareness, increased fascial restrictions, impaired perceived functional ability, increased muscle spasms, impaired flexibility, impaired sensation, improper body mechanics, postural dysfunction, and pain.   ACTIVITY LIMITATIONS: carrying, lifting, bending, sitting, standing, squatting, sleeping, stairs, transfers, bed mobility, bathing, toileting, dressing, reach over head, hygiene/grooming, locomotion level, and caring for others  PARTICIPATION LIMITATIONS: meal prep, cleaning, laundry, driving, community activity, and yard work  PERSONAL FACTORS: Fitness, Past/current experiences, Time since onset of injury/illness/exacerbation, Transportation, and 3+ comorbidities: ACDF C3-4, C4-5, C5-6 on 07/23/22; L hip pain - severe OA; arthiritis; BPH; anemia; hypothyroidism; MVR & TVR; thyroid disease; vertigo  are also affecting patient's functional outcome.   REHAB POTENTIAL: Good  CLINICAL DECISION MAKING: Evolving/moderate complexity  EVALUATION COMPLEXITY: Moderate   GOALS: Goals reviewed with  patient? Yes  SHORT TERM GOALS: Target date: 09/09/2022   Patient will be independent with initial HEP. Baseline:  Goal status: MET  09/03/22  2.  Patient will improve 5x STS time to </= 15 seconds for improved efficiency and safety with  transfers Baseline: 16.63 sec Goal status: MET  09/04/11 - 5xSTS = 14.50 sec  LONG TERM GOALS: Target date: 09/30/2022   Patient will be independent with advanced/ongoing HEP to improve outcomes and carryover.  Baseline:  Goal status: IN PROGRESS  2.  Patient will demonstrate improved B LE strength to >/= 4+/5 for improved stability and ease of mobility. Baseline:  Goal status: PARTIALLY MET  09/03/22 - Refer to above MMT table - Met for all but L hip (advanced OA)  3.  Patient will be able to ambulate 600' with LRAD and normal gait pattern across various surfaces to access community.  Baseline:  Goal status: IN PROGRESS  4.  Patient will improve gait velocity to at least 2.62 ft/sec for improved gait efficiency and safety as a community ambulator. Baseline:  Goal status: MET  09/03/22 - Gait speed = 3.04 ft/sec with and w/o SPC  5. Patient will be able to ascend/descend stairs with 1 HR and reciprocal step pattern safely to access home and community.  Baseline:  Goal status: IN PROGRESS  6.  Patient will report 20/80 on LEFS to demonstrate improved functional ability. Baseline: LEFS 10/80 = 12.5% (87.5% disability) Goal status: IN PROGRESS  7.  Patient will demonstrate decreased TUG time to </= 13.5 sec to decrease risk for falls with transitional mobility Baseline: 23.93 sec w/o AD, 17.03 sec with SPC Goal status: MET  09/03/22 - TUG = 12.94 sec w/o AD; 10.85 sec with SPC  9.  Patient will demonstrate at least 19/24 on DGI to decrease risk of falls. Baseline: DGI = 12/24 Goal status: IN PROGRESS     PLAN: PT FREQUENCY: 2x/week  PT DURATION: 6 weeks  PLANNED INTERVENTIONS: Therapeutic exercises, Therapeutic activity, Neuromuscular re-education, Balance training, Gait training, Patient/Family education, Self Care, Joint mobilization, Stair training, DME instructions, Dry Needling, Electrical stimulation, Cryotherapy, Moist heat, Taping, Ultrasound, Ionotophoresis 4mg /ml  Dexamethasone, Manual therapy, and Re-evaluation  PLAN FOR NEXT SESSION: hip flexibility; LE strengthening; balance/gait stability; postural and UE strengthening; hybrid therapy with 1 visit each land- and aquatic-based per week.   Percival Spanish, PT 09/11/2022, 12:43 PM

## 2022-09-11 NOTE — Telephone Encounter (Signed)
Cervical spine [facility]: [phone] exp. 09/09/22-10/08/22

## 2022-09-12 ENCOUNTER — Encounter: Payer: Self-pay | Admitting: Family Medicine

## 2022-09-16 ENCOUNTER — Ambulatory Visit: Payer: BC Managed Care – PPO

## 2022-09-18 ENCOUNTER — Ambulatory Visit (INDEPENDENT_AMBULATORY_CARE_PROVIDER_SITE_OTHER): Payer: BC Managed Care – PPO

## 2022-09-18 DIAGNOSIS — R202 Paresthesia of skin: Secondary | ICD-10-CM | POA: Diagnosis not present

## 2022-09-18 DIAGNOSIS — R531 Weakness: Secondary | ICD-10-CM

## 2022-09-18 DIAGNOSIS — M542 Cervicalgia: Secondary | ICD-10-CM | POA: Diagnosis not present

## 2022-09-18 DIAGNOSIS — G959 Disease of spinal cord, unspecified: Secondary | ICD-10-CM | POA: Diagnosis not present

## 2022-09-19 ENCOUNTER — Ambulatory Visit: Payer: BC Managed Care – PPO | Admitting: Physical Therapy

## 2022-09-19 ENCOUNTER — Encounter: Payer: Self-pay | Admitting: Physical Therapy

## 2022-09-19 DIAGNOSIS — R2689 Other abnormalities of gait and mobility: Secondary | ICD-10-CM

## 2022-09-19 DIAGNOSIS — R2681 Unsteadiness on feet: Secondary | ICD-10-CM | POA: Diagnosis not present

## 2022-09-19 DIAGNOSIS — R262 Difficulty in walking, not elsewhere classified: Secondary | ICD-10-CM | POA: Diagnosis not present

## 2022-09-19 DIAGNOSIS — M6281 Muscle weakness (generalized): Secondary | ICD-10-CM | POA: Diagnosis not present

## 2022-09-19 NOTE — Therapy (Signed)
OUTPATIENT PHYSICAL THERAPY TREATMENT   Patient Name: Alex West MRN: [id number] DOB:02-20-1956, 66 y.o., male Today's Date: 09/19/2022  .      PT End of Session - 09/19/22 0931     Visit Number 6    Date for PT Re-Evaluation 09/30/22    Authorization Type [employer] - VL: 60 (7 used)    Progress Note Due on Visit 81   MD PN on visit #4 - 09/03/22   PT Start Time 0931    PT Stop Time 1017    PT Time Calculation (min) 46 min    Activity Tolerance Patient tolerated treatment well    Behavior During Therapy WFL for tasks assessed/performed                  Past Medical History:  Diagnosis Date   Arthritis    BPH (benign prostatic hyperplasia)    Colon polyp    ED (erectile dysfunction)    Elevated liver enzymes    Gallstones    Hepatitis    History of anemia    Hypercholesterolemia    Hypothyroidism    Mild tricuspid regurgitation    Mitral valve regurgitation    Thyroid disease    Vertigo    Past Surgical History:  Procedure Laterality Date   ANTERIOR CERVICAL DECOMP/DISCECTOMY FUSION N/A 07/23/2022   Procedure: Anterior Cervical Discectomy and Fusion Cervical Three-Four/Four-Five/Five-Six;  Surgeon: Vallarie Mare, MD;  Location: [facility];  Service: Neurosurgery;  Laterality: N/A;  3C   FLEXIBLE SIGMOIDOSCOPY     Patient Active Problem List   Diagnosis Date Noted   Paresthesia 08/21/2022   Stenosis of cervical spine with myelopathy ([place]) 07/23/2022   Gait abnormality 06/18/2022   Cervical myelopathy ([place]) 06/17/2022   Weakness 06/06/2022   Neck pain 06/06/2022   OA (osteoarthritis) of hip 07/24/2021   Immunosuppressed status ([place]) 09/05/2014   Hepatitis, autoimmune ([place]) 09/05/2014    PCP: Lawerance Cruel, MD  REFERRING PROVIDER: Vallarie Mare, MD  REFERRING DIAG: [phone] (ICD-10-CM) - Cervical spondylosis with myelopathy  THERAPY DIAG:  Other abnormalities of gait and mobility  Muscle weakness (generalized)  Difficulty in  walking, not elsewhere classified  Unsteadiness on feet  RATIONALE FOR EVALUATION AND TREATMENT: Rehabilitation  ONSET DATE: 07/23/2022 - ACDF C3-4, C4-5, C5-6   NEXT MD VISIT: 09/06/22 - surgery f/u  SUBJECTIVE:   SUBJECTIVE STATEMENT: Pt reports he has not had any relief from the hip injection. He also continues to have the nerve pain from his neck so he had another cervical, thoracic and lumbar MRI yesterday to see if the MD can identify why he is still having pain - results pending.  PAIN:  Are you having pain? Yes: NPRS scale:  8/10 Pain location: L hip when moving Pain description: grinding in the socket Aggravating factors: certain movements Relieving factors: heat & ice  Are you having pain? Yes: NPRS scale:  7 /10 Pain location: B shoulder and arms Pain description: nerve pain and weakness Aggravating factors: "just there" Relieving factors: gabapentin   PERTINENT HISTORY: ACDF C3-4, C4-5, C5-6 on 07/23/22; L hip pain - severe OA; arthiritis; BPH; anemia; hypothyroidism; MVR & TVR; thyroid disease; vertigo  PRECAUTIONS: None   WEIGHT BEARING RESTRICTIONS: No  FALLS:  Has patient fallen in last 6 months? No  LIVING ENVIRONMENT: Lives with: lives with their spouse Lives in: House/apartment Stairs: Yes: Internal: 14 steps; on right going up and External: 3 steps; on left going up Has following equipment  at home: Single point cane and Walker - 2 wheeled  OCCUPATION: Retired  PLOF: Independent and Leisure: play golf, gym 5x/wk   PATIENT GOALS: "I want to get back to walking normally again."   OBJECTIVE:   DIAGNOSTIC FINDINGS:  06/13/22 - Cervical MRI:  IMPRESSION: This MRI of the cervical spine without contrast shows the following: Increase signal within the spinal cord adjacent to C3-C4 consistent with compressive myelopathy. At C3-C4 and C5-C6 there is severe spinal stenosis, at C4-C5 there is moderate spinal stenosis and at C2-C3, C6-C7 and C7-T1 there is  mild spinal stenosis. There are various degrees of foraminal narrowing at every level in the cervical spine.  This is severe at C3-C4 where there is probable bilateral C4 nerve root compression, and moderately severe at C4-C5 and C5-C6 where there is potential for bilateral C5 and C6 nerve root compression.  PATIENT SURVEYS:  NDI  23/50 = 46.0%, moderate disability LEFS 10/80 = 12.5%, 87.5% disability  COGNITION: Overall cognitive status: Within functional limits for tasks assessed     SENSATION: Light touch: Impaired  Intermittent numbness in R thumb and B hands, burning and tingling in L shoulder and UE  MUSCLE LENGTH: Hamstrings: mild/mod tight L>R ITB: mod tight B Piriformis: mod/severe tight L, mod tight R Hip flexors: mod tight L>R Quads: mod tight L>R  POSTURE:  No Significant postural limitations and cervical collar in place  CERVICAL ROM:   Active ROM AROM (deg) 09/11/22  Flexion 32  Extension 44  Right lateral flexion 23  Left lateral flexion 13  Right rotation 52  Left rotation 51   (Blank rows = not tested)   UPPER EXTREMITY ROM: Deferred on eval d/t pt reported post-op precautions  Active ROM Right 09/11/22 Left 09/11/22  Shoulder flexion 131 139  Shoulder extension 36 39  Shoulder abduction 157 151  Shoulder adduction    Shoulder internal rotation FIR to L2 FIR to L1  Shoulder external rotation FER - unable to reach behind FER - unable to reach behind  Elbow flexion    Elbow extension    Wrist flexion    Wrist extension    Wrist ulnar deviation    Wrist radial deviation    Wrist pronation    Wrist supination    (Blank rows = not tested)  UPPER EXTREMITY MMT:  Deferred on eval d/t pt reported post-op precautions  MMT Right 09/11/22 Left 09/11/22  Shoulder flexion 4+ 4+  Shoulder extension 4 4-  Shoulder abduction 4+ 4+  Shoulder adduction    Shoulder internal rotation 4+ 4+  Shoulder external rotation 4 4  Middle trapezius    Lower  trapezius    Elbow flexion 5 4+  Elbow extension 4+ 4+  Wrist flexion    Wrist extension    Wrist ulnar deviation    Wrist radial deviation    Wrist pronation    Wrist supination    Grip strength (lbs) 75 68  (Blank rows = not tested)  LOWER EXTREMITY ROM: B hip ROM mild to moderately limited in all planes with greatest limitation in L hip flexion and IR/ER  LOWER EXTREMITY MMT: Tested in sitting on eval; *standard testing positions except extension tested in sidelying on 09/03/22  MMT Right eval Left eval Right 09/03/22 Left 09/03/22  Hip flexion 4+ 4 5 4+  Hip extension 4 4+ 4+ * 4 *  Hip abduction 4+ 4 4+ 4  Hip adduction 4+ 4+ 5 4+  Hip internal rotation 4 4  4+ 4+  Hip external rotation 4 4 4+ 4+  Knee flexion _0 4+  Knee extension 4+ 4+ 5 5  Ankle dorsiflexion 4 4+ 4+ 4+  Ankle plantarflexion   5 5-  Ankle inversion      Ankle eversion       (Blank rows = not tested)  BED MOBILITY:  NT  TRANSFERS: Assistive device utilized: None  Sit to stand: Modified independence Stand to sit: Modified independence Chair to chair:  NT Floor:  NT  GAIT: Distance walked: 60 ft Assistive device utilized: Single point cane Level of assistance: Modified independence Gait pattern: step through pattern, decreased stride length, decreased hip/knee flexion- Right, decreased hip/knee flexion- Left, lateral hip instability, and decreased trunk rotation Comments: 1.89 ft/sec gait speed = limited community ambulator  RAMP: Level of Assistance:  NT Assistive device utilized:  NT Ramp Comments:   CURB:  Level of Assistance:  NT Assistive device utilized:  NT Curb Comments:   STAIRS:  Level of Assistance: SBA  Stair Negotiation Technique: Step to Pattern with Single Rail on Right  Number of Stairs: 14   Height of Stairs: 7  Comments: leads with R LE  FUNCTIONAL TESTS:  5 times sit to stand: 16.63 sec; >15 sec = recurrent fall risk Timed up and go (TUG): 23.93 sec w/o AD,  17.03 sec with SPC; >13.5 sec = high fall risk 10 meter walk test: 17.38 sec with SPC - gait speed = 1.89 ft/sec; ,1.8 ft/sec = risk for recurrent falls Dynamic Gait Index: 12/24 - Scores of 19 or less are predictive of falls in older community living adults   TODAY'S TREATMENT:  09/19/22 THERAPEUTIC EXERCISE: to improve flexibility, strength and mobility.  Verbal and tactile cues throughout for technique. UBE L3.0 x 6 min (3' each fwd & back) Standing GTB scap retraction row 15 x 3" - cues to avoid excessive shoulder extension and upward rotation of elbows Standing GTB scap retraction + shoulder extension to neutral 15 x 3" Standing GTB scap retraction + B shoulder ER standing with back along doorframe 15 x 3" - cues to keep elbows tucked at sides Standing RTB scap retraction + shoulder horiz ABD 10 x 3" Standing RTB scap retraction + shoulder horiz ABD diagonals 10 x 3" - cues to avoid lateral trunk lean L mod thomas hip flexor stretch 3 x 30-60"  MANUAL THERAPY: To promote normalized muscle tension, improved flexibility, improved joint mobility, increased ROM, and reduced pain. STM/DTM and manual TPR to L hip flexors    09/11/22 THERAPEUTIC EXERCISE: to improve flexibility, strength and mobility.  Verbal and tactile cues throughout for technique. UBE x 6 min (3' each fwd & back) Seated GTB scap retraction row 15 x 3" Seated GTB scap retraction + shoulder extension to neutral 15 x 3" Seated GTB scap retraction + B shoulder ER 15 x 3"  THERAPEUTIC ACTIVITIES: Cervical and UE ROM assessment UE  MMT   09/03/22 THERAPEUTIC EXERCISE: to improve flexibility, strength and mobility.  Verbal and tactile cues throughout for technique. NuStep - L5 x 7 min (LE only)  THERAPEUTIC ACTIVITIES: 5xSTS = 14.50 sec TUG = 12.94 sec w/o AD; 10.85 sec with SPC 10MWT = 10.79 sec with and w/o SPC  Gait speed = 3.04 ft/sec  MMT  Goal assessment   PATIENT EDUCATION:  Education details:  resume  hip stretches from prior PT episode Person educated: Patient Education method: Explanation Education comprehension: verbalized understanding  HOME EXERCISE PROGRAM: Access  Code: [email or web address] URL: [email or web address] Date: 09/11/2022 Prepared by: Annie Paras  Exercises - Seated Table Hamstring Stretch  - 2 x daily - 7 x weekly - 2 sets - 30 sec hold - Supine Figure 4 Piriformis Stretch  - 2 x daily - 7 x weekly - 2 sets - 30 sec hold - Supine Piriformis Stretch with Foot on Ground  - 2 x daily - 7 x weekly - 2 sets - 30 sec hold - Sit to Stand  - 1 x daily - 3 x weekly - 3 sets - 10 reps - Standing Hip Abduction with Resistance at Ankles and Counter Support  - 1 x daily - 3 x weekly - 2 sets - 10 reps - 3 sec hold - Standing Hip Extension with Resistance at Ankles and Counter Support  - 1 x daily - 3 x weekly - 2 sets - 10 reps - 3 sec hold - Marching with Resistance  - 1 x daily - 3 x weekly - 2 sets - 10 reps - 3 sec hold - Standing Hamstring Curl with Resistance  - 1 x daily - 3 x weekly - 2 sets - 10 reps - 3 sec hold - Standing Shoulder Row with Anchored Resistance  - 1 x daily - 3-4 x weekly - 2 sets - 10 reps - 5 sec hold - Scapular Retraction with Resistance Advanced  - 1 x daily - 3-4 x weekly - 2 sets - 10 reps - 5 sec hold - Shoulder External Rotation and Scapular Retraction with Resistance  - 1 x daily - 3 x weekly - 2 sets - 10 reps - 3-5 sec hold   ASSESSMENT:  CLINICAL IMPRESSION: Gerhard "[name]" reports he had repeat cervical, thoracic and lumbar MRIs yesterday in an attempt to determine why he is still having the radicular pain since the post-op x-rays show good fixation of fusion hardware. He also continues to be limited with mobility due to L hip pain from severe OA which did not respond to latest hip injection. Continue focus on postural and UE strengthening today with cues necessary for pacing and proper movement patterns. L hip pain limiting standing  tolerance during upper body exercises, therefore shifted focus temporarily to address hip flexor tightness to relieve pressure across arthritic hip joint - pt noting improved ability to straighten his hip with decreased pain and improved standing tolerance following MT. Pt to resume hip stretches from prior PT episode to reduce tension/pressure through L hip joint until able to proceed with THA.   OBJECTIVE IMPAIRMENTS: Abnormal gait, decreased activity tolerance, decreased balance, decreased endurance, decreased mobility, difficulty walking, decreased strength, decreased safety awareness, increased fascial restrictions, impaired perceived functional ability, increased muscle spasms, impaired flexibility, impaired sensation, improper body mechanics, postural dysfunction, and pain.   ACTIVITY LIMITATIONS: carrying, lifting, bending, sitting, standing, squatting, sleeping, stairs, transfers, bed mobility, bathing, toileting, dressing, reach over head, hygiene/grooming, locomotion level, and caring for others  PARTICIPATION LIMITATIONS: meal prep, cleaning, laundry, driving, community activity, and yard work  PERSONAL FACTORS: Fitness, Past/current experiences, Time since onset of injury/illness/exacerbation, Transportation, and 3+ comorbidities: ACDF C3-4, C4-5, C5-6 on 07/23/22; L hip pain - severe OA; arthiritis; BPH; anemia; hypothyroidism; MVR & TVR; thyroid disease; vertigo  are also affecting patient's functional outcome.   REHAB POTENTIAL: Good  CLINICAL DECISION MAKING: Evolving/moderate complexity  EVALUATION COMPLEXITY: Moderate   GOALS: Goals reviewed with patient? Yes  SHORT TERM GOALS: Target date: 09/09/2022   Patient will be independent with initial  HEP. Baseline:  Goal status: MET  09/03/22  2.  Patient will improve 5x STS time to </= 15 seconds for improved efficiency and safety with transfers Baseline: 16.63 sec Goal status: MET  09/04/11 - 5xSTS = 14.50 sec  LONG TERM GOALS:  Target date: 09/30/2022   Patient will be independent with advanced/ongoing HEP to improve outcomes and carryover.  Baseline:  Goal status: IN PROGRESS  2.  Patient will demonstrate improved B LE strength to >/= 4+/5 for improved stability and ease of mobility. Baseline:  Goal status: PARTIALLY MET  09/03/22 - Refer to above MMT table - Met for all but L hip (advanced OA)  3.  Patient will be able to ambulate 600' with LRAD and normal gait pattern across various surfaces to access community.  Baseline:  Goal status: IN PROGRESS  4.  Patient will improve gait velocity to at least 2.62 ft/sec for improved gait efficiency and safety as a community ambulator. Baseline:  Goal status: MET  09/03/22 - Gait speed = 3.04 ft/sec with and w/o SPC  5. Patient will be able to ascend/descend stairs with 1 HR and reciprocal step pattern safely to access home and community.  Baseline:  Goal status: IN PROGRESS  6.  Patient will report 20/80 on LEFS to demonstrate improved functional ability. Baseline: LEFS 10/80 = 12.5% (87.5% disability) Goal status: IN PROGRESS  7.  Patient will demonstrate decreased TUG time to </= 13.5 sec to decrease risk for falls with transitional mobility Baseline: 23.93 sec w/o AD, 17.03 sec with SPC Goal status: MET  09/03/22 - TUG = 12.94 sec w/o AD; 10.85 sec with SPC  9.  Patient will demonstrate at least 19/24 on DGI to decrease risk of falls. Baseline: DGI = 12/24 Goal status: IN PROGRESS     PLAN: PT FREQUENCY: 2x/week  PT DURATION: 6 weeks  PLANNED INTERVENTIONS: Therapeutic exercises, Therapeutic activity, Neuromuscular re-education, Balance training, Gait training, Patient/Family education, Self Care, Joint mobilization, Stair training, DME instructions, Dry Needling, Electrical stimulation, Cryotherapy, Moist heat, Taping, Ultrasound, Ionotophoresis 84m/ml Dexamethasone, Manual therapy, and Re-evaluation  PLAN FOR NEXT SESSION: hip flexibility; LE  strengthening; balance/gait stability; postural and UE strengthening; hybrid therapy with 1 visit each land- and aquatic-based per week.   JPercival Spanish PT 09/19/2022, 12:36 PM

## 2022-09-20 ENCOUNTER — Encounter: Payer: Self-pay | Admitting: Neurology

## 2022-09-23 ENCOUNTER — Encounter: Payer: Self-pay | Admitting: Physical Therapy

## 2022-09-23 ENCOUNTER — Telehealth: Payer: Self-pay | Admitting: Neurology

## 2022-09-23 ENCOUNTER — Ambulatory Visit: Payer: BC Managed Care – PPO | Attending: Neurosurgery | Admitting: Physical Therapy

## 2022-09-23 DIAGNOSIS — M6281 Muscle weakness (generalized): Secondary | ICD-10-CM | POA: Diagnosis not present

## 2022-09-23 DIAGNOSIS — R2681 Unsteadiness on feet: Secondary | ICD-10-CM | POA: Insufficient documentation

## 2022-09-23 DIAGNOSIS — G959 Disease of spinal cord, unspecified: Secondary | ICD-10-CM

## 2022-09-23 DIAGNOSIS — R2689 Other abnormalities of gait and mobility: Secondary | ICD-10-CM | POA: Diagnosis not present

## 2022-09-23 DIAGNOSIS — M25552 Pain in left hip: Secondary | ICD-10-CM | POA: Diagnosis not present

## 2022-09-23 DIAGNOSIS — R262 Difficulty in walking, not elsewhere classified: Secondary | ICD-10-CM | POA: Insufficient documentation

## 2022-09-23 NOTE — Therapy (Signed)
OUTPATIENT PHYSICAL THERAPY TREATMENT   Patient Name: Alex West MRN: [id number] DOB:1956-01-11, 66 y.o., male Today's Date: 09/23/2022  .      PT End of Session - 09/23/22 0933     Visit Number 7    Date for PT Re-Evaluation 09/30/22    Authorization Type [employer] - VL: 60 (7 used)    Progress Note Due on Visit 89   MD PN on visit #4 - 09/03/22   PT Start Time 4158    PT Stop Time 1016    PT Time Calculation (min) 43 min    Activity Tolerance Patient tolerated treatment well    Behavior During Therapy WFL for tasks assessed/performed                   Past Medical History:  Diagnosis Date   Arthritis    BPH (benign prostatic hyperplasia)    Colon polyp    ED (erectile dysfunction)    Elevated liver enzymes    Gallstones    Hepatitis    History of anemia    Hypercholesterolemia    Hypothyroidism    Mild tricuspid regurgitation    Mitral valve regurgitation    Thyroid disease    Vertigo    Past Surgical History:  Procedure Laterality Date   ANTERIOR CERVICAL DECOMP/DISCECTOMY FUSION N/A 07/23/2022   Procedure: Anterior Cervical Discectomy and Fusion Cervical Three-Four/Four-Five/Five-Six;  Surgeon: Vallarie Mare, MD;  Location: [facility];  Service: Neurosurgery;  Laterality: N/A;  3C   FLEXIBLE SIGMOIDOSCOPY     Patient Active Problem List   Diagnosis Date Noted   Paresthesia 08/21/2022   Stenosis of cervical spine with myelopathy ([place]) 07/23/2022   Gait abnormality 06/18/2022   Cervical myelopathy ([place]) 06/17/2022   Weakness 06/06/2022   Neck pain 06/06/2022   OA (osteoarthritis) of hip 07/24/2021   Immunosuppressed status ([place]) 09/05/2014   Hepatitis, autoimmune ([place]) 09/05/2014    PCP: Lawerance Cruel, MD  REFERRING PROVIDER: Vallarie Mare, MD  REFERRING DIAG: [phone] (ICD-10-CM) - Cervical spondylosis with myelopathy  THERAPY DIAG:  Other abnormalities of gait and mobility  Muscle weakness (generalized)  Difficulty in  walking, not elsewhere classified  Unsteadiness on feet  RATIONALE FOR EVALUATION AND TREATMENT: Rehabilitation  ONSET DATE: 07/23/2022 - ACDF C3-4, C4-5, C5-6   NEXT MD VISIT: 09/06/22 - surgery f/u  SUBJECTIVE:   SUBJECTIVE STATEMENT: Pt reports his hip is a lot better today - not sure if it was the MT last visit or the shot is kicking in or both. Still notes heaviness in his limbs  PAIN:  Are you having pain? Yes: NPRS scale: 4-5/10 Pain location: L hip when moving Pain description: grinding in the socket Aggravating factors: certain movements Relieving factors: heat & ice  Are you having pain? No - B shoulder and arms   PERTINENT HISTORY: ACDF C3-4, C4-5, C5-6 on 07/23/22; L hip pain - severe OA; arthiritis; BPH; anemia; hypothyroidism; MVR & TVR; thyroid disease; vertigo  PRECAUTIONS: None   WEIGHT BEARING RESTRICTIONS: No  FALLS:  Has patient fallen in last 6 months? No  LIVING ENVIRONMENT: Lives with: lives with their spouse Lives in: House/apartment Stairs: Yes: Internal: 14 steps; on right going up and External: 3 steps; on left going up Has following equipment at home: Single point cane and Walker - 2 wheeled  OCCUPATION: Retired  PLOF: Independent and Leisure: play golf, gym 5x/wk   PATIENT GOALS: "I want to get back to walking normally again."  OBJECTIVE:   DIAGNOSTIC FINDINGS:  09/18/22 - Cervical MRI: IMPRESSION: MRI cervical spine (without) demonstrating: -Anterior cervical discectomy and fusion with metal hardware spanning C3, C4, C5 and C6 levels. -Moderate spinal stenosis at C3-4, C4-5 and C5-6 levels.  Mild myelomalacia noted at C4 level.  20 -Multilevel foraminal stenosis spanning C2-3 down to C7-T1 levels as above. -Compared to MRI from 06/13/2022 there have been interval surgical changes as above. Spinal stenosis has slightly improved from prior study. 09/18/22 - Thoracic MRI: IMPRESSION: MRI thoracic spine without contrast demonstrating: - At  T10-11 broad disc bulging, facet and ligamentum flavum hypertrophy with mild spinal stenosis and mild bilateral foraminal stenosis; subtle increased T2 signal within the spinal cord at T10 level (series 12 image 28, 29) may reflect mild spinal cord edema, syringomyelia or artifact. 09/18/22 - Lumbar MRI: IMPRESSION: MRI lumbar spine (without) demonstrating: - At L3-4: Disc bulging and facet vertebrae with moderate spinal stenosis and mild bilateral foraminal stenosis. - At L4-5: Disc bulging and facet hypertrophy with moderate spinal stenosis and mild bilateral foraminal stenosis. - At L5-S1: Disc bulging and facet hypertrophy with moderate right and mild left foraminal stenosis. - Lumbarization of S1 segment with transitional features.  Lowest visualized intervertebral disc level is labeled S1-2 and the study.  Note enumeration scheme if intervention is planned.  06/13/22 - Cervical MRI:  IMPRESSION: This MRI of the cervical spine without contrast shows the following: Increase signal within the spinal cord adjacent to C3-C4 consistent with compressive myelopathy. At C3-C4 and C5-C6 there is severe spinal stenosis, at C4-C5 there is moderate spinal stenosis and at C2-C3, C6-C7 and C7-T1 there is mild spinal stenosis. There are various degrees of foraminal narrowing at every level in the cervical spine.  This is severe at C3-C4 where there is probable bilateral C4 nerve root compression, and moderately severe at C4-C5 and C5-C6 where there is potential for bilateral C5 and C6 nerve root compression.  PATIENT SURVEYS:  NDI  23/50 = 46.0%, moderate disability LEFS 10/80 = 12.5%, 87.5% disability  COGNITION: Overall cognitive status: Within functional limits for tasks assessed     SENSATION: Light touch: Impaired  Intermittent numbness in R thumb and B hands, burning and tingling in L shoulder and UE  MUSCLE LENGTH: Hamstrings: mild/mod tight L>R ITB: mod tight B Piriformis: mod/severe tight L,  mod tight R Hip flexors: mod tight L>R Quads: mod tight L>R  POSTURE:  No Significant postural limitations and cervical collar in place  CERVICAL ROM:   Active ROM AROM (deg) 09/11/22  Flexion 32  Extension 44  Right lateral flexion 23  Left lateral flexion 13  Right rotation 52  Left rotation 51   (Blank rows = not tested)   UPPER EXTREMITY ROM: Deferred on eval d/t pt reported post-op precautions  Active ROM Right 09/11/22 Left 09/11/22  Shoulder flexion 131 139  Shoulder extension 36 39  Shoulder abduction 157 151  Shoulder adduction    Shoulder internal rotation FIR to L2 FIR to L1  Shoulder external rotation FER - unable to reach behind FER - unable to reach behind  Elbow flexion    Elbow extension    Wrist flexion    Wrist extension    Wrist ulnar deviation    Wrist radial deviation    Wrist pronation    Wrist supination    (Blank rows = not tested)  UPPER EXTREMITY MMT:  Deferred on eval d/t pt reported post-op precautions  MMT Right 09/11/22 Left 09/11/22  Shoulder flexion 4+ 4+  Shoulder extension 4 4-  Shoulder abduction 4+ 4+  Shoulder adduction    Shoulder internal rotation 4+ 4+  Shoulder external rotation 4 4  Middle trapezius    Lower trapezius    Elbow flexion 5 4+  Elbow extension 4+ 4+  Wrist flexion    Wrist extension    Wrist ulnar deviation    Wrist radial deviation    Wrist pronation    Wrist supination    Grip strength (lbs) 75 68  (Blank rows = not tested)  LOWER EXTREMITY ROM: B hip ROM mild to moderately limited in all planes with greatest limitation in L hip flexion and IR/ER  LOWER EXTREMITY MMT: Tested in sitting on eval; *standard testing positions except extension tested in sidelying on 09/03/22  MMT Right eval Left eval Right 09/03/22 Left 09/03/22  Hip flexion 4+ 4 5 4+  Hip extension 4 4+ 4+ * 4 *  Hip abduction 4+ 4 4+ 4  Hip adduction 4+ 4+ 5 4+  Hip internal rotation 4 4 4+ 4+  Hip external rotation 4 4 4+ 4+   Knee flexion $RemoveBefo'4 4 5 'RAVeHCgVFPT$ 4+  Knee extension 4+ 4+ 5 5  Ankle dorsiflexion 4 4+ 4+ 4+  Ankle plantarflexion   5 5-  Ankle inversion      Ankle eversion       (Blank rows = not tested)  BED MOBILITY:  NT  TRANSFERS: Assistive device utilized: None  Sit to stand: Modified independence Stand to sit: Modified independence Chair to chair:  NT Floor:  NT  GAIT: Distance walked: 60 ft Assistive device utilized: Single point cane Level of assistance: Modified independence Gait pattern: step through pattern, decreased stride length, decreased hip/knee flexion- Right, decreased hip/knee flexion- Left, lateral hip instability, and decreased trunk rotation Comments: 1.89 ft/sec gait speed = limited community ambulator  RAMP: Level of Assistance:  NT Assistive device utilized:  NT Ramp Comments:   CURB:  Level of Assistance:  NT Assistive device utilized:  NT Curb Comments:   STAIRS:  Level of Assistance: SBA  Stair Negotiation Technique: Step to Pattern with Single Rail on Right  Number of Stairs: 14   Height of Stairs: 7  Comments: leads with R LE  FUNCTIONAL TESTS:  5 times sit to stand: 16.63 sec; >15 sec = recurrent fall risk Timed up and go (TUG): 23.93 sec w/o AD, 17.03 sec with SPC; >13.5 sec = high fall risk 10 meter walk test: 17.38 sec with SPC - gait speed = 1.89 ft/sec; ,1.8 ft/sec = risk for recurrent falls Dynamic Gait Index: 12/24 - Scores of 19 or less are predictive of falls in older community living adults   TODAY'S TREATMENT:  09/23/22 THERAPEUTIC EXERCISE: to improve flexibility, strength and mobility.  Verbal and tactile cues throughout for technique. Rec Bike L3 x 7 min  Bridge + GTB clam 10 x 3", 2 sets Sustained bridge + GTB march x 10 R/L sidelying GTB clam 10 x 3" Hooklying dead bug 10 x 3" TrA + sequential 90/90 march 10 x 3" R/L single leg bridge 10 x 3" Hooklying TrA + GTB B shoulder extension 10 x 3"  Hooklying TrA + GTB B scap retraction +  shoulder horiz ABD 10 x 3" Hooklying TrA + GTB B scap retraction + shoulder horiz ABD diagonals 10 x 3"  Quadruped alt UE & LE raises x 10 each L mod thomas hip flexor stretch 3 x 30-60"  09/19/22 THERAPEUTIC EXERCISE: to improve flexibility, strength and mobility.  Verbal and tactile cues throughout for technique. UBE L3.0 x 6 min (3' each fwd & back) Standing GTB scap retraction row 15 x 3" - cues to avoid excessive shoulder extension and upward rotation of elbows Standing GTB scap retraction + shoulder extension to neutral 15 x 3" Standing GTB scap retraction + B shoulder ER standing with back along doorframe 15 x 3" - cues to keep elbows tucked at sides Standing RTB scap retraction + shoulder horiz ABD 10 x 3" Standing RTB scap retraction + shoulder horiz ABD diagonals 10 x 3" - cues to avoid lateral trunk lean L mod thomas hip flexor stretch 3 x 30-60"  MANUAL THERAPY: To promote normalized muscle tension, improved flexibility, improved joint mobility, increased ROM, and reduced pain. STM/DTM and manual TPR to L hip flexors   09/11/22 THERAPEUTIC EXERCISE: to improve flexibility, strength and mobility.  Verbal and tactile cues throughout for technique. UBE x 6 min (3' each fwd & back) Seated GTB scap retraction row 15 x 3" Seated GTB scap retraction + shoulder extension to neutral 15 x 3" Seated GTB scap retraction + B shoulder ER 15 x 3"  THERAPEUTIC ACTIVITIES: Cervical and UE ROM assessment UE  MMT   PATIENT EDUCATION:  Education details:  resume hip stretches from prior PT episode Person educated: Patient Education method: Explanation Education comprehension: verbalized understanding  HOME EXERCISE PROGRAM: Access Code: [email or web address] URL: [email or web address] Date: 09/11/2022 Prepared by: Annie Paras  Exercises - Seated Table Hamstring Stretch  - 2 x daily - 7 x weekly - 2 sets - 30 sec hold - Supine Figure 4 Piriformis Stretch  - 2 x daily - 7 x  weekly - 2 sets - 30 sec hold - Supine Piriformis Stretch with Foot on Ground  - 2 x daily - 7 x weekly - 2 sets - 30 sec hold - Sit to Stand  - 1 x daily - 3 x weekly - 3 sets - 10 reps - Standing Hip Abduction with Resistance at Ankles and Counter Support  - 1 x daily - 3 x weekly - 2 sets - 10 reps - 3 sec hold - Standing Hip Extension with Resistance at Ankles and Counter Support  - 1 x daily - 3 x weekly - 2 sets - 10 reps - 3 sec hold - Marching with Resistance  - 1 x daily - 3 x weekly - 2 sets - 10 reps - 3 sec hold - Standing Hamstring Curl with Resistance  - 1 x daily - 3 x weekly - 2 sets - 10 reps - 3 sec hold - Standing Shoulder Row with Anchored Resistance  - 1 x daily - 3-4 x weekly - 2 sets - 10 reps - 5 sec hold - Scapular Retraction with Resistance Advanced  - 1 x daily - 3-4 x weekly - 2 sets - 10 reps - 5 sec hold - Shoulder External Rotation and Scapular Retraction with Resistance  - 1 x daily - 3 x weekly - 2 sets - 10 reps - 3-5 sec hold   ASSESSMENT:  CLINICAL IMPRESSION: Lynnae Sandhoff "Alex West" reports his R hip pain is much better today, most likely due to combination of MT last visit and the cortisone injection provided by the MD. Able to progress core and lumbopelvic strengthening today with less interference from hip pain. Exercises alternating with mix of UE and LE while focusing of core activation to allow for rest breaks/recovery  while promoting UE/LE strengthening. Cues necessary for pacing and alignment with some exercises. Pt noting exercises are challenging but denies any increased pain. Alex West requesting to finish session with hip flexor stretches - mod thomas version performed but seated and standing alternatives also reviewed.  OBJECTIVE IMPAIRMENTS: Abnormal gait, decreased activity tolerance, decreased balance, decreased endurance, decreased mobility, difficulty walking, decreased strength, decreased safety awareness, increased fascial restrictions, impaired  perceived functional ability, increased muscle spasms, impaired flexibility, impaired sensation, improper body mechanics, postural dysfunction, and pain.   ACTIVITY LIMITATIONS: carrying, lifting, bending, sitting, standing, squatting, sleeping, stairs, transfers, bed mobility, bathing, toileting, dressing, reach over head, hygiene/grooming, locomotion level, and caring for others  PARTICIPATION LIMITATIONS: meal prep, cleaning, laundry, driving, community activity, and yard work  PERSONAL FACTORS: Fitness, Past/current experiences, Time since onset of injury/illness/exacerbation, Transportation, and 3+ comorbidities: ACDF C3-4, C4-5, C5-6 on 07/23/22; L hip pain - severe OA; arthiritis; BPH; anemia; hypothyroidism; MVR & TVR; thyroid disease; vertigo  are also affecting patient's functional outcome.   REHAB POTENTIAL: Good  CLINICAL DECISION MAKING: Evolving/moderate complexity  EVALUATION COMPLEXITY: Moderate   GOALS: Goals reviewed with patient? Yes  SHORT TERM GOALS: Target date: 09/09/2022   Patient will be independent with initial HEP. Baseline:  Goal status: MET  09/03/22  2.  Patient will improve 5x STS time to </= 15 seconds for improved efficiency and safety with transfers Baseline: 16.63 sec Goal status: MET  09/04/11 - 5xSTS = 14.50 sec  LONG TERM GOALS: Target date: 09/30/2022   Patient will be independent with advanced/ongoing HEP to improve outcomes and carryover.  Baseline:  Goal status: IN PROGRESS  2.  Patient will demonstrate improved B LE strength to >/= 4+/5 for improved stability and ease of mobility. Baseline:  Goal status: PARTIALLY MET  09/03/22 - Refer to above MMT table - Met for all but L hip (advanced OA)  3.  Patient will be able to ambulate 600' with LRAD and normal gait pattern across various surfaces to access community.  Baseline:  Goal status: IN PROGRESS  4.  Patient will improve gait velocity to at least 2.62 ft/sec for improved gait efficiency  and safety as a community ambulator. Baseline:  Goal status: MET  09/03/22 - Gait speed = 3.04 ft/sec with and w/o SPC  5. Patient will be able to ascend/descend stairs with 1 HR and reciprocal step pattern safely to access home and community.  Baseline:  Goal status: IN PROGRESS  6.  Patient will report 20/80 on LEFS to demonstrate improved functional ability. Baseline: LEFS 10/80 = 12.5% (87.5% disability) Goal status: IN PROGRESS  7.  Patient will demonstrate decreased TUG time to </= 13.5 sec to decrease risk for falls with transitional mobility Baseline: 23.93 sec w/o AD, 17.03 sec with SPC Goal status: MET  09/03/22 - TUG = 12.94 sec w/o AD; 10.85 sec with SPC  9.  Patient will demonstrate at least 19/24 on DGI to decrease risk of falls. Baseline: DGI = 12/24 Goal status: IN PROGRESS     PLAN: PT FREQUENCY: 2x/week  PT DURATION: 6 weeks  PLANNED INTERVENTIONS: Therapeutic exercises, Therapeutic activity, Neuromuscular re-education, Balance training, Gait training, Patient/Family education, Self Care, Joint mobilization, Stair training, DME instructions, Dry Needling, Electrical stimulation, Cryotherapy, Moist heat, Taping, Ultrasound, Ionotophoresis 4mg /ml Dexamethasone, Manual therapy, and Re-evaluation  PLAN FOR NEXT SESSION: hip flexibility; LE strengthening; balance/gait stability; postural and UE strengthening; hybrid therapy with 1 visit each land- and aquatic-based per week.   Percival Spanish, PT  09/23/2022, 10:19 AM

## 2022-09-23 NOTE — Telephone Encounter (Signed)
Please call patient, MRI of the cervical showed some of anterior cervical fusion C3 C4-5-6 level, evidence of spinal cord signal abnormality at C4, slight improvement of spinal canal stenosis compared to previous imaging study  MRI of lumbar spine also showed multilevel degenerative changes moderate spinal stenosis  MRI of thoracic spine showed multilevel degenerative changes, T2 signal abnormality within T10  I have followed the report Dr. Marcello Moores and Lawerance Cruel, MD   I also ordered EMG/NCS for further evaluation, we may review MRIs in detail at that visit.   MRI cervical spine (without) demonstrating: -Anterior cervical discectomy and fusion with metal hardware spanning C3, C4, C5 and C6 levels. -Moderate spinal stenosis at C3-4, C4-5 and C5-6 levels.  Mild myelomalacia noted at C4 level.  20 -Multilevel foraminal stenosis spanning C2-3 down to C7-T1 levels as above. -Compared to MRI from 06/13/2022 there have been interval surgical changes as above. Spinal stenosis has slightly improved from prior study.   MRI thoracic spine without contrast demonstrating: - At T10-11 broad disc bulging, facet and ligamentum flavum hypertrophy with mild spinal stenosis and mild bilateral foraminal stenosis; subtle increased T2 signal within the spinal cord at T10 level (series 12 image 28, 29) may reflect mild spinal cord edema, syringomyelia or artifact.    MRI lumbar spine (without) demonstrating: - At L3-4: Disc bulging and facet vertebrae with moderate spinal stenosis and mild bilateral foraminal stenosis. - At L4-5: Disc bulging and facet hypertrophy with moderate spinal stenosis and mild bilateral foraminal stenosis. - At L5-S1: Disc bulging and facet hypertrophy with moderate right and mild left foraminal stenosis. - Lumbarization of S1 segment with transitional features.  Lowest visualized intervertebral disc level is labeled S1-2 and the study.  Note enumeration scheme if intervention is  planned.

## 2022-09-25 NOTE — Telephone Encounter (Signed)
Pt verified by name and DOB,  results given per provider, pt voiced understanding all question answered.  Pt will discuss mri further during EMG/NCS appt.

## 2022-09-26 ENCOUNTER — Encounter: Payer: Self-pay | Admitting: Physical Therapy

## 2022-09-26 ENCOUNTER — Ambulatory Visit: Payer: BC Managed Care – PPO | Admitting: Physical Therapy

## 2022-09-26 DIAGNOSIS — R262 Difficulty in walking, not elsewhere classified: Secondary | ICD-10-CM | POA: Diagnosis not present

## 2022-09-26 DIAGNOSIS — R2689 Other abnormalities of gait and mobility: Secondary | ICD-10-CM

## 2022-09-26 DIAGNOSIS — M6281 Muscle weakness (generalized): Secondary | ICD-10-CM

## 2022-09-26 DIAGNOSIS — M25552 Pain in left hip: Secondary | ICD-10-CM | POA: Diagnosis not present

## 2022-09-26 DIAGNOSIS — R2681 Unsteadiness on feet: Secondary | ICD-10-CM

## 2022-09-26 NOTE — Therapy (Signed)
OUTPATIENT PHYSICAL THERAPY TREATMENT / RE-CERTIFICATION   Patient Name: Alex West MRN: [id number] DOB:1956/07/12, 66 y.o., male Today's Date: 09/26/2022  Progress Note  Reporting Period 09/06/2022 to 09/26/2022  See note below for Objective Data and Assessment of Progress/Goals.      PT End of Session - 09/26/22 0934     Visit Number 8    Date for PT Re-Evaluation 11/07/22    Authorization Type [employer] - VL: 39 (7 used earlier this year)    Progress Note Due on Visit 78   Recert on visit #8 - 25/9/56   PT Start Time 0934    PT Stop Time 1016    PT Time Calculation (min) 42 min    Activity Tolerance Patient tolerated treatment well    Behavior During Therapy WFL for tasks assessed/performed                    Past Medical History:  Diagnosis Date   Arthritis    BPH (benign prostatic hyperplasia)    Colon polyp    ED (erectile dysfunction)    Elevated liver enzymes    Gallstones    Hepatitis    History of anemia    Hypercholesterolemia    Hypothyroidism    Mild tricuspid regurgitation    Mitral valve regurgitation    Thyroid disease    Vertigo    Past Surgical History:  Procedure Laterality Date   ANTERIOR CERVICAL DECOMP/DISCECTOMY FUSION N/A 07/23/2022   Procedure: Anterior Cervical Discectomy and Fusion Cervical Three-Four/Four-Five/Five-Six;  Surgeon: Vallarie Mare, MD;  Location: [facility];  Service: Neurosurgery;  Laterality: N/A;  3C   FLEXIBLE SIGMOIDOSCOPY     Patient Active Problem List   Diagnosis Date Noted   Paresthesia 08/21/2022   Stenosis of cervical spine with myelopathy ([place]) 07/23/2022   Gait abnormality 06/18/2022   Cervical myelopathy ([place]) 06/17/2022   Weakness 06/06/2022   Neck pain 06/06/2022   OA (osteoarthritis) of hip 07/24/2021   Immunosuppressed status ([place]) 09/05/2014   Hepatitis, autoimmune ([place]) 09/05/2014    PCP: Lawerance Cruel, MD  REFERRING PROVIDER: Vallarie Mare, MD  REFERRING DIAG:  [phone] (ICD-10-CM) - Cervical spondylosis with myelopathy  THERAPY DIAG:  Other abnormalities of gait and mobility  Muscle weakness (generalized)  Difficulty in walking, not elsewhere classified  Unsteadiness on feet  RATIONALE FOR EVALUATION AND TREATMENT: Rehabilitation  ONSET DATE: 07/23/2022 - ACDF C3-4, C4-5, C5-6   NEXT MD VISIT: 09/06/22 - surgery f/u  SUBJECTIVE:   SUBJECTIVE STATEMENT: Pt reports increased heaviness in both arms and legs today - he states this varies from day to day. He has not had the f/u with the MD re: his recent MRIs yet.  PAIN:  Are you having pain? No - L hip  Are you having pain? No - B shoulder and arms  PERTINENT HISTORY: ACDF C3-4, C4-5, C5-6 on 07/23/22; L hip pain - severe OA; arthiritis; BPH; anemia; hypothyroidism; MVR & TVR; thyroid disease; vertigo  PRECAUTIONS: None   WEIGHT BEARING RESTRICTIONS: No  FALLS:  Has patient fallen in last 6 months? No  LIVING ENVIRONMENT: Lives with: lives with their spouse Lives in: House/apartment Stairs: Yes: Internal: 14 steps; on right going up and External: 3 steps; on left going up Has following equipment at home: Single point cane and Walker - 2 wheeled  OCCUPATION: Retired  PLOF: Independent and Leisure: play golf, gym 5x/wk   PATIENT GOALS: "I want to get back to walking normally  again."   OBJECTIVE:   DIAGNOSTIC FINDINGS:  09/18/22 - Cervical MRI: IMPRESSION: MRI cervical spine (without) demonstrating: - Anterior cervical discectomy and fusion with metal hardware spanning C3, C4, C5 and C6 levels. - Moderate spinal stenosis at C3-4, C4-5 and C5-6 levels.  Mild myelomalacia noted at C4 level.  20 - Multilevel foraminal stenosis spanning C2-3 down to C7-T1 levels as above. - Compared to MRI from 06/13/2022 there have been interval surgical changes as above. Spinal stenosis has slightly improved from prior study. 09/18/22 - Thoracic MRI: IMPRESSION: MRI thoracic spine without contrast  demonstrating: - At T10-11 broad disc bulging, facet and ligamentum flavum hypertrophy with mild spinal stenosis and mild bilateral foraminal stenosis; subtle increased T2 signal within the spinal cord at T10 level (series 12 image 28, 29) may reflect mild spinal cord edema, syringomyelia or artifact. 09/18/22 - Lumbar MRI: IMPRESSION: MRI lumbar spine (without) demonstrating: - At L3-4: Disc bulging and facet vertebrae with moderate spinal stenosis and mild bilateral foraminal stenosis. - At L4-5: Disc bulging and facet hypertrophy with moderate spinal stenosis and mild bilateral foraminal stenosis. - At L5-S1: Disc bulging and facet hypertrophy with moderate right and mild left foraminal stenosis. - Lumbarization of S1 segment with transitional features.  Lowest visualized intervertebral disc level is labeled S1-2 and the study.  Note enumeration scheme if intervention is planned.  06/13/22 - Cervical MRI:  IMPRESSION: This MRI of the cervical spine without contrast shows the following: Increase signal within the spinal cord adjacent to C3-C4 consistent with compressive myelopathy. At C3-C4 and C5-C6 there is severe spinal stenosis, at C4-C5 there is moderate spinal stenosis and at C2-C3, C6-C7 and C7-T1 there is mild spinal stenosis. There are various degrees of foraminal narrowing at every level in the cervical spine.  This is severe at C3-C4 where there is probable bilateral C4 nerve root compression, and moderately severe at C4-C5 and C5-C6 where there is potential for bilateral C5 and C6 nerve root compression.  PATIENT SURVEYS:  NDI  23/50 = 46.0%, moderate disability LEFS 10/80 = 12.5%, 87.5% disability  COGNITION: Overall cognitive status: Within functional limits for tasks assessed     SENSATION: Light touch: Impaired  Intermittent numbness in R thumb and B hands, burning and tingling in L shoulder and UE  MUSCLE LENGTH: Hamstrings: mild/mod tight L>R ITB: mod tight  B Piriformis: mod/severe tight L, mod tight R Hip flexors: mod tight L>R Quads: mod tight L>R  POSTURE:  No Significant postural limitations and cervical collar in place  CERVICAL ROM:   Active ROM AROM (deg) 09/11/22  Flexion 32  Extension 44  Right lateral flexion 23  Left lateral flexion 13  Right rotation 52  Left rotation 51   (Blank rows = not tested)   UPPER EXTREMITY ROM: Deferred on eval d/t pt reported post-op precautions  Active ROM Right 09/11/22 Left 09/11/22 Right 09/26/22 Left 09/26/22  Shoulder flexion 131 139 124 124  Shoulder extension 36 39 38 32  Shoulder abduction 157 151 104 105  Shoulder adduction      Shoulder internal rotation FIR to L2 FIR to L1    Shoulder external rotation FER - unable to reach behind FER - unable to reach behind    Elbow flexion      Elbow extension      Wrist flexion      Wrist extension      Wrist ulnar deviation      Wrist radial deviation  Wrist pronation      Wrist supination      (Blank rows = not tested)  UPPER EXTREMITY MMT:  Deferred on eval d/t pt reported post-op precautions  MMT Right 09/11/22 Left 09/11/22 Right 09/26/22 Left 09/26/22  Shoulder flexion 4+ 4+ 4+ 4+  Shoulder extension 4 4- 4+ 4+  Shoulder abduction 4+ 4+ 4+ 4+  Shoulder adduction      Shoulder internal rotation 4+ 4+ 4+ 4+  Shoulder external rotation _0 Middle trapezius      Lower trapezius      Elbow flexion 5 4+ 5 5  Elbow extension 4+ 4+ 4+ 4+  Wrist flexion      Wrist extension      Wrist ulnar deviation      Wrist radial deviation      Wrist pronation      Wrist supination      Grip strength (lbs) 75 68    (Blank rows = not tested)  LOWER EXTREMITY ROM: B hip ROM mild to moderately limited in all planes with greatest limitation in L hip flexion and IR/ER  LOWER EXTREMITY MMT: Tested in sitting on eval; *standard testing positions except extension tested in sidelying on 09/03/22  MMT Right eval Left eval  Right 09/03/22 Left 09/03/22 Right 09/26/22 Left 09/26/22  Hip flexion 4+ 4 5 4+ 4+ 4  Hip extension 4 4+ 4+ * 4 * 4- 4-  Hip abduction 4+ 4 4+ 4 4 seated 4- seated  Hip adduction 4+ 4+ 5 4+ 4+ seated 4- seated  Hip internal rotation 4 4 4+ 4+ 4 4  Hip external rotation 4 4 4+ 4+ 4 4  Knee flexion _1 4+ 4+ 4+  Knee extension 4+ 4+ _2 4+  Ankle dorsiflexion 4 4+ 4+ 4+ 5 5  Ankle plantarflexion   5 5- 5- 20 SLS HR (Limited lift) 4 13 SLS HR  Ankle inversion        Ankle eversion         (Blank rows = not tested)  BED MOBILITY:  NT  TRANSFERS: Assistive device utilized: None  Sit to stand: Modified independence Stand to sit: Modified independence Chair to chair:  NT Floor:  NT  GAIT: Distance walked: 60 ft Assistive device utilized: Single point cane Level of assistance: Modified independence Gait pattern: step through pattern, decreased stride length, decreased hip/knee flexion- Right, decreased hip/knee flexion- Left, lateral hip instability, and decreased trunk rotation Comments: 1.89 ft/sec gait speed = limited community ambulator  RAMP: Level of Assistance:  NT Assistive device utilized:  NT Ramp Comments:   CURB:  Level of Assistance:  NT Assistive device utilized:  NT Curb Comments:   STAIRS:  Level of Assistance: SBA  Stair Negotiation Technique: Step to Pattern with Single Rail on Right  Number of Stairs: 14   Height of Stairs: 7  Comments: leads with R LE  FUNCTIONAL TESTS:  5 times sit to stand: 16.63 sec; >15 sec = recurrent fall risk Timed up and go (TUG): 23.93 sec w/o AD, 17.03 sec with SPC; >13.5 sec = high fall risk 10 meter walk test: 17.38 sec with SPC - gait speed = 1.89 ft/sec; ,1.8 ft/sec = risk for recurrent falls Dynamic Gait Index: 12/24 - Scores of 19 or less are predictive of falls in older community living adults   TODAY'S TREATMENT:  09/26/22 THERAPEUTIC EXERCISE: to improve flexibility, strength and mobility.  Verbal  and  tactile cues throughout for technique. Rec Bike L3 x 7 min   THERAPEUTIC ACTIVITIES: ROM MMT 5 times sit to stand: 12.84 sec Timed up and go (TUG): 11.56 sec w/o AD, 10.44 sec with SPC 10 meter walk test: 11.74 sec w/o AD, 11.81 with SPC - gait speed = 2.79 ft/sec w/o AD, 2.78 ft/sec with SPC - gait w/o AD with wider BOS and mild ataxia Dynamic Gait Index: 13/24 - Scores of 19 or less are predictive of falls in older community living adults   09/23/22 THERAPEUTIC EXERCISE: to improve flexibility, strength and mobility.  Verbal and tactile cues throughout for technique. Rec Bike L3 x 7 min  Bridge + GTB clam 10 x 3", 2 sets Sustained bridge + GTB march x 10 R/L sidelying GTB clam 10 x 3" Hooklying dead bug 10 x 3" TrA + sequential 90/90 march 10 x 3" R/L single leg bridge 10 x 3" Hooklying TrA + GTB B shoulder extension 10 x 3"  Hooklying TrA + GTB B scap retraction + shoulder horiz ABD 10 x 3" Hooklying TrA + GTB B scap retraction + shoulder horiz ABD diagonals 10 x 3"  Quadruped alt UE & LE raises x 10 each L mod thomas hip flexor stretch 3 x 30-60"   09/19/22 THERAPEUTIC EXERCISE: to improve flexibility, strength and mobility.  Verbal and tactile cues throughout for technique. UBE L3.0 x 6 min (3' each fwd & back) Standing GTB scap retraction row 15 x 3" - cues to avoid excessive shoulder extension and upward rotation of elbows Standing GTB scap retraction + shoulder extension to neutral 15 x 3" Standing GTB scap retraction + B shoulder ER standing with back along doorframe 15 x 3" - cues to keep elbows tucked at sides Standing RTB scap retraction + shoulder horiz ABD 10 x 3" Standing RTB scap retraction + shoulder horiz ABD diagonals 10 x 3" - cues to avoid lateral trunk lean L mod thomas hip flexor stretch 3 x 30-60"  MANUAL THERAPY: To promote normalized muscle tension, improved flexibility, improved joint mobility, increased ROM, and reduced pain. STM/DTM and manual TPR  to L hip flexors   PATIENT EDUCATION:  Education details:  resume hip stretches from prior PT episode Person educated: Patient Education method: Explanation Education comprehension: verbalized understanding  HOME EXERCISE PROGRAM: Access Code: [email or web address] URL: [email or web address] Date: 09/11/2022 Prepared by: Annie Paras  Exercises - Seated Table Hamstring Stretch  - 2 x daily - 7 x weekly - 2 sets - 30 sec hold - Supine Figure 4 Piriformis Stretch  - 2 x daily - 7 x weekly - 2 sets - 30 sec hold - Supine Piriformis Stretch with Foot on Ground  - 2 x daily - 7 x weekly - 2 sets - 30 sec hold - Sit to Stand  - 1 x daily - 3 x weekly - 3 sets - 10 reps - Standing Hip Abduction with Resistance at Ankles and Counter Support  - 1 x daily - 3 x weekly - 2 sets - 10 reps - 3 sec hold - Standing Hip Extension with Resistance at Ankles and Counter Support  - 1 x daily - 3 x weekly - 2 sets - 10 reps - 3 sec hold - Marching with Resistance  - 1 x daily - 3 x weekly - 2 sets - 10 reps - 3 sec hold - Standing Hamstring Curl with Resistance  - 1 x daily - 3 x weekly - 2 sets - 10  reps - 3 sec hold - Standing Shoulder Row with Anchored Resistance  - 1 x daily - 3-4 x weekly - 2 sets - 10 reps - 5 sec hold - Scapular Retraction with Resistance Advanced  - 1 x daily - 3-4 x weekly - 2 sets - 10 reps - 5 sec hold - Shoulder External Rotation and Scapular Retraction with Resistance  - 1 x daily - 3 x weekly - 2 sets - 10 reps - 3-5 sec hold   ASSESSMENT:  CLINICAL IMPRESSION: Alex "[name]" denies pain today but notes increased feeling of heaviness in all extremities. B shoulder ROM more limited today due to feeling of heaviness/weakness but actual UE MMT slightly improved. LE weakness more pronounced today with lateral hip testing completed in sitting rather than sidelying as pt afraid that sidelying would flare his L hip back up. Balance testing also revealing fluctuations today with  slight improvement noted on 5xSTS, TUG and DGI but declining gait speed with increased BOS and mild ataxia apparent when ambulating w/o AD. [name] frustrated by lack of perceived improvement following cervical fusion along with ongoing UE and LE heaviness/weakness. Given ongoing strength, mobility/gait and balance deficits, will recommend recert for additional 2x/wk x 4-6 weeks incorporating aquatic therapy for 1 visit per week utilizing the buoyancy and hydrostatic pressure of water for support and to offload joints, esp his L hip due to advanced OA, along with the viscosity of the water for resistance of strengthening and water current perturbations to provide challenge to standing balance for increased core activation.  OBJECTIVE IMPAIRMENTS: Abnormal gait, decreased activity tolerance, decreased balance, decreased endurance, decreased mobility, difficulty walking, decreased strength, decreased safety awareness, increased fascial restrictions, impaired perceived functional ability, increased muscle spasms, impaired flexibility, impaired sensation, improper body mechanics, postural dysfunction, and pain.   ACTIVITY LIMITATIONS: carrying, lifting, bending, sitting, standing, squatting, sleeping, stairs, transfers, bed mobility, bathing, toileting, dressing, reach over head, hygiene/grooming, locomotion level, and caring for others  PARTICIPATION LIMITATIONS: meal prep, cleaning, laundry, driving, community activity, and yard work  PERSONAL FACTORS: Fitness, Past/current experiences, Time since onset of injury/illness/exacerbation, Transportation, and 3+ comorbidities: ACDF C3-4, C4-5, C5-6 on 07/23/22; L hip pain - severe OA; arthiritis; BPH; anemia; hypothyroidism; MVR & TVR; thyroid disease; vertigo  are also affecting patient's functional outcome.   REHAB POTENTIAL: Good  CLINICAL DECISION MAKING: Evolving/moderate complexity  EVALUATION COMPLEXITY: Moderate   GOALS: Goals reviewed with patient?  Yes  SHORT TERM GOALS: Target date: 09/09/2022   Patient will be independent with initial HEP. Baseline:  Goal status: MET  09/03/22  2.  Patient will improve 5x STS time to </= 15 seconds for improved efficiency and safety with transfers Baseline: 16.63 sec Goal status: MET  09/04/11 - 5xSTS = 14.50 sec; 09/26/22 - 12.84 sec  LONG TERM GOALS: Target date: 09/30/2022; extended to 11/07/22  Patient will be independent with advanced/ongoing HEP to improve outcomes and carryover.  Baseline:  Goal status: IN PROGRESS  09/26/22 - met for current HEP  2.  Patient will demonstrate improved B LE strength to >/= 4+/5 for improved stability and ease of mobility. Baseline:  Goal status: IN PROGRESS  09/26/22 - Refer to above MMT table - strength fluctuating from last testing   3.  Patient will be able to ambulate 600' with LRAD and normal gait pattern across various surfaces to access community.  Baseline:  Goal status: IN PROGRESS  4.  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 Baseline:  Goal status: MET  09/03/22 - Gait speed = 3.04 ft/sec with and w/o SPC; 09/26/22 - 2.79 ft/sec w/o AD, 2.78 ft/sec with SPC - gait w/o AD with wider BOS and mild ataxia  5. Patient will be able to ascend/descend stairs with 1 HR and reciprocal step pattern safely to access home and community.  Baseline:  Goal status: MET  09/26/22  6.  Patient will report 20/80 on LEFS to demonstrate improved functional ability. Baseline: LEFS 10/80 = 12.5% (87.5% disability) Goal status: IN PROGRESS  7.  Patient will demonstrate decreased TUG time to </= 13.5 sec to decrease risk for falls with transitional mobility Baseline: 23.93 sec w/o AD, 17.03 sec with SPC Goal status: MET  09/03/22 - TUG = 12.94 sec w/o AD; 10.85 sec with SPC; 09/26/22 - 11.56 sec w/o AD, 10.44 sec with SPC  9.  Patient will demonstrate at least 19/24 on DGI to decrease  risk of falls. Baseline: DGI = 12/24 Goal status: IN PROGRESS 09/26/22 - 13/24    PLAN: PT FREQUENCY: 2x/week  PT DURATION: 4-6 weeks  PLANNED INTERVENTIONS: Therapeutic exercises, Therapeutic activity, Neuromuscular re-education, Balance training, Gait training, Patient/Family education, Self Care, Joint mobilization, Stair training, DME instructions, Dry Needling, Electrical stimulation, Cryotherapy, Moist heat, Taping, Ultrasound, Ionotophoresis 69m/ml Dexamethasone, Manual therapy, and Re-evaluation  PLAN FOR NEXT SESSION: hip flexibility; LE strengthening; balance/gait stability; postural and UE strengthening; hybrid therapy with 1 visit each land- and aquatic-based per week.   JPercival Spanish PT 09/26/2022, 1:52 PM

## 2022-09-27 DIAGNOSIS — M4712 Other spondylosis with myelopathy, cervical region: Secondary | ICD-10-CM | POA: Diagnosis not present

## 2022-09-29 NOTE — Therapy (Signed)
OUTPATIENT PHYSICAL THERAPY TREATMENT / RE-CERTIFICATION   Patient Name: Alex West MRN: [id number] DOB:04/16/1956, 66 y.o., male Today's Date: 09/30/2022    PT End of Session -     Visit Number 9   Number of Visits    Date for PT Re-Evaluation 11/07/2022   Authorization Type [employer]   PT Start Time  901   PT Stop Time 945   PT Time Calculation (min) 44   Activity Tolerance Pt tolerated treatment well   Behavior During Therapy  Wfl for tasks           Past Medical History:  Diagnosis Date   Arthritis    BPH (benign prostatic hyperplasia)    Colon polyp    ED (erectile dysfunction)    Elevated liver enzymes    Gallstones    Hepatitis    History of anemia    Hypercholesterolemia    Hypothyroidism    Mild tricuspid regurgitation    Mitral valve regurgitation    Thyroid disease    Vertigo    Past Surgical History:  Procedure Laterality Date   ANTERIOR CERVICAL DECOMP/DISCECTOMY FUSION N/A 07/23/2022   Procedure: Anterior Cervical Discectomy and Fusion Cervical Three-Four/Four-Five/Five-Six;  Surgeon: Thomas, Jonathan G, MD;  Location: [facility] OR;  Service: Neurosurgery;  Laterality: N/A;  3C   FLEXIBLE SIGMOIDOSCOPY     Patient Active Problem List   Diagnosis Date Noted   Paresthesia 08/21/2022   Stenosis of cervical spine with myelopathy (HCC) 07/23/2022   Gait abnormality 06/18/2022   Cervical myelopathy (HCC) 06/17/2022   Weakness 06/06/2022   Neck pain 06/06/2022   OA (osteoarthritis) of hip 07/24/2021   Immunosuppressed status (HCC) 09/05/2014   Hepatitis, autoimmune (HCC) 09/05/2014    PCP: Ross, Charles Alan, MD  REFERRING PROVIDER: Thomas, Jonathan G, MD  REFERRING DIAG: M47.12 (ICD-10-CM) - Cervical spondylosis with myelopathy  THERAPY DIAG:  Other abnormalities of gait and mobility  Muscle weakness (generalized)  Difficulty in walking, not elsewhere classified  Unsteadiness on feet  Pain in left hip  RATIONALE FOR EVALUATION AND  TREATMENT: Rehabilitation  ONSET DATE: 07/23/2022 - ACDF C3-4, C4-5, C5-6   NEXT MD VISIT: 09/06/22 - surgery f/u  SUBJECTIVE:   SUBJECTIVE STATEMENT: Pt reports  he is looking forward to the pool session.  No pain.  Arms and legs continue to feel heavy   PAIN:  Are you having pain? No - L hip  Are you having pain? No - B shoulder and arms  PERTINENT HISTORY: ACDF C3-4, C4-5, C5-6 on 07/23/22; L hip pain - severe OA; arthiritis; BPH; anemia; hypothyroidism; MVR & TVR; thyroid disease; vertigo  PRECAUTIONS: None   WEIGHT BEARING RESTRICTIONS: No  FALLS:  Has patient fallen in last 6 months? No  LIVING ENVIRONMENT: Lives with: lives with their spouse Lives in: House/apartment Stairs: Yes: Internal: 14 steps; on right going up and External: 3 steps; on left going up Has following equipment at home: Single point cane and Walker - 2 wheeled  OCCUPATION: Retired  PLOF: Independent and Leisure: play golf, gym 5x/wk   PATIENT GOALS: "I want to get back to walking normally again."   OBJECTIVE:   DIAGNOSTIC FINDINGS:  09/18/22 - Cervical MRI: IMPRESSION: MRI cervical spine (without) demonstrating: - Anterior cervical discectomy and fusion with metal hardware spanning C3, C4, C5 and C6 levels. - Moderate spinal stenosis at C3-4, C4-5 and C5-6 levels.  Mild myelomalacia noted at C4 level.  20 - Multilevel foraminal stenosis spanning C2-3 down to C7-T1   levels as above. - Compared to MRI from 06/13/2022 there have been interval surgical changes as above. Spinal stenosis has slightly improved from prior study. 09/18/22 - Thoracic MRI: IMPRESSION: MRI thoracic spine without contrast demonstrating: - At T10-11 broad disc bulging, facet and ligamentum flavum hypertrophy with mild spinal stenosis and mild bilateral foraminal stenosis; subtle increased T2 signal within the spinal cord at T10 level (series 12 image 28, 29) may reflect mild spinal cord edema, syringomyelia or artifact. 09/18/22  - Lumbar MRI: IMPRESSION: MRI lumbar spine (without) demonstrating: - At L3-4: Disc bulging and facet vertebrae with moderate spinal stenosis and mild bilateral foraminal stenosis. - At L4-5: Disc bulging and facet hypertrophy with moderate spinal stenosis and mild bilateral foraminal stenosis. - At L5-S1: Disc bulging and facet hypertrophy with moderate right and mild left foraminal stenosis. - Lumbarization of S1 segment with transitional features.  Lowest visualized intervertebral disc level is labeled S1-2 and the study.  Note enumeration scheme if intervention is planned.  06/13/22 - Cervical MRI:  IMPRESSION: This MRI of the cervical spine without contrast shows the following: Increase signal within the spinal cord adjacent to C3-C4 consistent with compressive myelopathy. At C3-C4 and C5-C6 there is severe spinal stenosis, at C4-C5 there is moderate spinal stenosis and at C2-C3, C6-C7 and C7-T1 there is mild spinal stenosis. There are various degrees of foraminal narrowing at every level in the cervical spine.  This is severe at C3-C4 where there is probable bilateral C4 nerve root compression, and moderately severe at C4-C5 and C5-C6 where there is potential for bilateral C5 and C6 nerve root compression.  PATIENT SURVEYS:  NDI  23/50 = 46.0%, moderate disability LEFS 10/80 = 12.5%, 87.5% disability  COGNITION: Overall cognitive status: Within functional limits for tasks assessed     SENSATION: Light touch: Impaired  Intermittent numbness in R thumb and B hands, burning and tingling in L shoulder and UE  MUSCLE LENGTH: Hamstrings: mild/mod tight L>R ITB: mod tight B Piriformis: mod/severe tight L, mod tight R Hip flexors: mod tight L>R Quads: mod tight L>R  POSTURE:  No Significant postural limitations and cervical collar in place  CERVICAL ROM:   Active ROM AROM (deg) 09/11/22  Flexion 32  Extension 44  Right lateral flexion 23  Left lateral flexion 13  Right rotation  52  Left rotation 51   (Blank rows = not tested)   UPPER EXTREMITY ROM: Deferred on eval d/t pt reported post-op precautions  Active ROM Right 09/11/22 Left 09/11/22 Right 09/26/22 Left 09/26/22  Shoulder flexion 131 139 124 124  Shoulder extension 36 39 38 32  Shoulder abduction 157 151 104 105  Shoulder adduction      Shoulder internal rotation FIR to L2 FIR to L1    Shoulder external rotation FER - unable to reach behind FER - unable to reach behind    Elbow flexion      Elbow extension      Wrist flexion      Wrist extension      Wrist ulnar deviation      Wrist radial deviation      Wrist pronation      Wrist supination      (Blank rows = not tested)  UPPER EXTREMITY MMT:  Deferred on eval d/t pt reported post-op precautions  MMT Right 09/11/22 Left 09/11/22 Right 09/26/22 Left 09/26/22  Shoulder flexion 4+ 4+ 4+ 4+  Shoulder extension 4 4- 4+ 4+  Shoulder abduction 4+ 4+ 4+ 4+    Shoulder adduction      Shoulder internal rotation 4+ 4+ 4+ 4+  Shoulder external rotation _0 Middle trapezius      Lower trapezius      Elbow flexion 5 4+ 5 5  Elbow extension 4+ 4+ 4+ 4+  Wrist flexion      Wrist extension      Wrist ulnar deviation      Wrist radial deviation      Wrist pronation      Wrist supination      Grip strength (lbs) 75 68    (Blank rows = not tested)  LOWER EXTREMITY ROM: B hip ROM mild to moderately limited in all planes with greatest limitation in L hip flexion and IR/ER  LOWER EXTREMITY MMT: Tested in sitting on eval; *standard testing positions except extension tested in sidelying on 09/03/22  MMT Right eval Left eval Right 09/03/22 Left 09/03/22 Right 09/26/22 Left 09/26/22  Hip flexion 4+ 4 5 4+ 4+ 4  Hip extension 4 4+ 4+ * 4 * 4- 4-  Hip abduction 4+ 4 4+ 4 4 seated 4- seated  Hip adduction 4+ 4+ 5 4+ 4+ seated 4- seated  Hip internal rotation 4 4 4+ 4+ 4 4  Hip external rotation 4 4 4+ 4+ 4 4  Knee flexion _1 4+ 4+ 4+  Knee  extension 4+ 4+ _2 4+  Ankle dorsiflexion 4 4+ 4+ 4+ 5 5  Ankle plantarflexion   5 5- 5- 20 SLS HR (Limited lift) 4 13 SLS HR  Ankle inversion        Ankle eversion         (Blank rows = not tested)  BED MOBILITY:  NT  TRANSFERS: Assistive device utilized: None  Sit to stand: Modified independence Stand to sit: Modified independence Chair to chair:  NT Floor:  NT  GAIT: Distance walked: 60 ft Assistive device utilized: Single point cane Level of assistance: Modified independence Gait pattern: step through pattern, decreased stride length, decreased hip/knee flexion- Right, decreased hip/knee flexion- Left, lateral hip instability, and decreased trunk rotation Comments: 1.89 ft/sec gait speed = limited community ambulator  RAMP: Level of Assistance:  NT Assistive device utilized:  NT Ramp Comments:   CURB:  Level of Assistance:  NT Assistive device utilized:  NT Curb Comments:   STAIRS:  Level of Assistance: SBA  Stair Negotiation Technique: Step to Pattern with Single Rail on Right  Number of Stairs: 14   Height of Stairs: 7  Comments: leads with R LE  FUNCTIONAL TESTS:  5 times sit to stand: 16.63 sec; >15 sec = recurrent fall risk Timed up and go (TUG): 23.93 sec w/o AD, 17.03 sec with SPC; >13.5 sec = high fall risk 10 meter walk test: 17.38 sec with SPC - gait speed = 1.89 ft/sec; ,1.8 ft/sec = risk for recurrent falls Dynamic Gait Index: 12/24 - Scores of 19 or less are predictive of falls in older community living adults   TODAY'S TREATMENT:  09/30/22  Pt seen for aquatic therapy today.  Treatment took place in water 3.25-4.5 ft in depth at the [facility]. Temp of water was 91.  Pt entered/exited the pool via stairs step to pattern with hand rail.    *Intro to setting *Gait training/Walking forward, back and side stepping with cues for heel strike, toe off and step length/marching  *Side lunges with yellow hand buoys x 4 widths, Ue  add/abd *  Holding to wall hip circles; df then pf with decreased ue support for added core engagement and balance challenge    - add/abd; hip opener, squats x 10 *4 ft heel walking then toe walking 2 widths ea *Cycling on noodle with hand buoys for " handlebars". Demonstration and vc for gaining balance and relaxed execution.   Pt requires the buoyancy and hydrostatic pressure of water for support, and to offload joints by unweighting joint load by at least 50 % in navel deep water and by at least 75-80% in chest to neck deep water.  Viscosity of the water is needed for resistance of strengthening. Water current perturbations provides challenge to standing balance requiring increased core activation.        09/26/22 THERAPEUTIC EXERCISE: to improve flexibility, strength and mobility.  Verbal and tactile cues throughout for technique. Rec Bike L3 x 7 min   THERAPEUTIC ACTIVITIES: ROM MMT 5 times sit to stand: 12.84 sec Timed up and go (TUG): 11.56 sec w/o AD, 10.44 sec with SPC 10 meter walk test: 11.74 sec w/o AD, 11.81 with SPC - gait speed = 2.79 ft/sec w/o AD, 2.78 ft/sec with SPC - gait w/o AD with wider BOS and mild ataxia Dynamic Gait Index: 13/24 - Scores of 19 or less are predictive of falls in older community living adults   09/23/22 THERAPEUTIC EXERCISE: to improve flexibility, strength and mobility.  Verbal and tactile cues throughout for technique. Rec Bike L3 x 7 min  Bridge + GTB clam 10 x 3", 2 sets Sustained bridge + GTB march x 10 R/L sidelying GTB clam 10 x 3" Hooklying dead bug 10 x 3" TrA + sequential 90/90 march 10 x 3" R/L single leg bridge 10 x 3" Hooklying TrA + GTB B shoulder extension 10 x 3"  Hooklying TrA + GTB B scap retraction + shoulder horiz ABD 10 x 3" Hooklying TrA + GTB B scap retraction + shoulder horiz ABD diagonals 10 x 3"  Quadruped alt UE & LE raises x 10 each L mod thomas hip flexor stretch 3 x 30-60"   09/19/22 THERAPEUTIC EXERCISE:  to improve flexibility, strength and mobility.  Verbal and tactile cues throughout for technique. UBE L3.0 x 6 min (3' each fwd & back) Standing GTB scap retraction row 15 x 3" - cues to avoid excessive shoulder extension and upward rotation of elbows Standing GTB scap retraction + shoulder extension to neutral 15 x 3" Standing GTB scap retraction + B shoulder ER standing with back along doorframe 15 x 3" - cues to keep elbows tucked at sides Standing RTB scap retraction + shoulder horiz ABD 10 x 3" Standing RTB scap retraction + shoulder horiz ABD diagonals 10 x 3" - cues to avoid lateral trunk lean L mod thomas hip flexor stretch 3 x 30-60"  MANUAL THERAPY: To promote normalized muscle tension, improved flexibility, improved joint mobility, increased ROM, and reduced pain. STM/DTM and manual TPR to L hip flexors   PATIENT EDUCATION:  Education details:  resume hip stretches from prior PT episode Person educated: Patient Education method: Explanation Education comprehension: verbalized understanding  HOME EXERCISE PROGRAM: Access Code: [email or web address] URL: [email or web address] Date: 09/11/2022 Prepared by: JoAnne Kreis  Exercises - Seated Table Hamstring Stretch  - 2 x daily - 7 x weekly - 2 sets - 30 sec hold - Supine Figure 4 Piriformis Stretch  - 2 x daily - 7 x weekly - 2 sets - 30 sec hold - Supine Piriformis Stretch with Foot   on Ground  - 2 x daily - 7 x weekly - 2 sets - 30 sec hold - Sit to Stand  - 1 x daily - 3 x weekly - 3 sets - 10 reps - Standing Hip Abduction with Resistance at Ankles and Counter Support  - 1 x daily - 3 x weekly - 2 sets - 10 reps - 3 sec hold - Standing Hip Extension with Resistance at Ankles and Counter Support  - 1 x daily - 3 x weekly - 2 sets - 10 reps - 3 sec hold - Marching with Resistance  - 1 x daily - 3 x weekly - 2 sets - 10 reps - 3 sec hold - Standing Hamstring Curl with Resistance  - 1 x daily - 3 x weekly - 2 sets - 10 reps - 3  sec hold - Standing Shoulder Row with Anchored Resistance  - 1 x daily - 3-4 x weekly - 2 sets - 10 reps - 5 sec hold - Scapular Retraction with Resistance Advanced  - 1 x daily - 3-4 x weekly - 2 sets - 10 reps - 5 sec hold - Shoulder External Rotation and Scapular Retraction with Resistance  - 1 x daily - 3 x weekly - 2 sets - 10 reps - 3-5 sec hold   ASSESSMENT:  CLINICAL IMPRESSION: Pt demonstrates safety and indep in setting therapist instructing from deck. Pt edu on properties of water and benefit of aquatic therapy. He tolerates session well with focus on assessing immediate response from submerged activities, gait and balance training incorporating ue engagement.  He completes without discomfort.  Some mild unsteadiness which he overcomes with repetition. Core weakness evident with difficulty maintaining positioning with standing exercise requiring ue support on wall.  He is given vc and demonstration for proper execution of exercises.  He is a good candidate for aquatic therapy and will benefit using the properties of water to facilitate and progress pt towards meeting all goals.   Re-assess Impression: Dreyden "[name]" denies pain today but notes increased feeling of heaviness in all extremities. B shoulder ROM more limited today due to feeling of heaviness/weakness but actual UE MMT slightly improved. LE weakness more pronounced today with lateral hip testing completed in sitting rather than sidelying as pt afraid that sidelying would flare his L hip back up. Balance testing also revealing fluctuations today with slight improvement noted on 5xSTS, TUG and DGI but declining gait speed with increased BOS and mild ataxia apparent when ambulating w/o AD. [name] frustrated by lack of perceived improvement following cervical fusion along with ongoing UE and LE heaviness/weakness. Given ongoing strength, mobility/gait and balance deficits, will recommend recert for additional 2x/wk x 4-6 weeks  incorporating aquatic therapy for 1 visit per week utilizing the buoyancy and hydrostatic pressure of water for support and to offload joints, esp his L hip due to advanced OA, along with the viscosity of the water for resistance of strengthening and water current perturbations to provide challenge to standing balance for increased core activation.  OBJECTIVE IMPAIRMENTS: Abnormal gait, decreased activity tolerance, decreased balance, decreased endurance, decreased mobility, difficulty walking, decreased strength, decreased safety awareness, increased fascial restrictions, impaired perceived functional ability, increased muscle spasms, impaired flexibility, impaired sensation, improper body mechanics, postural dysfunction, and pain.   ACTIVITY LIMITATIONS: carrying, lifting, bending, sitting, standing, squatting, sleeping, stairs, transfers, bed mobility, bathing, toileting, dressing, reach over head, hygiene/grooming, locomotion level, and caring for others  PARTICIPATION LIMITATIONS: meal prep, cleaning, laundry, driving, community  activity, and yard work  PERSONAL FACTORS: Fitness, Past/current experiences, Time since onset of injury/illness/exacerbation, Transportation, and 3+ comorbidities: ACDF C3-4, C4-5, C5-6 on 07/23/22; L hip pain - severe OA; arthiritis; BPH; anemia; hypothyroidism; MVR & TVR; thyroid disease; vertigo  are also affecting patient's functional outcome.   REHAB POTENTIAL: Good  CLINICAL DECISION MAKING: Evolving/moderate complexity  EVALUATION COMPLEXITY: Moderate   GOALS: Goals reviewed with patient? Yes  SHORT TERM GOALS: Target date: 09/09/2022   Patient will be independent with initial HEP. Baseline:  Goal status: MET  09/03/22  2.  Patient will improve 5x STS time to </= 15 seconds for improved efficiency and safety with transfers Baseline: 16.63 sec Goal status: MET  09/04/11 - 5xSTS = 14.50 sec; 09/26/22 - 12.84 sec  LONG TERM GOALS: Target date: 09/30/2022;  extended to 11/07/22  Patient will be independent with advanced/ongoing HEP to improve outcomes and carryover.  Baseline:  Goal status: IN PROGRESS  09/26/22 - met for current HEP  2.  Patient will demonstrate improved B LE strength to >/= 4+/5 for improved stability and ease of mobility. Baseline:  Goal status: IN PROGRESS  09/26/22 - Refer to above MMT table - strength fluctuating from last testing   3.  Patient will be able to ambulate 600' with LRAD and normal gait pattern across various surfaces to access community.  Baseline:  Goal status: IN PROGRESS  4.  Patient will improve gait velocity to at least 2.62 ft/sec for improved gait efficiency and safety as a community ambulator. Baseline:  Goal status: MET  09/03/22 - Gait speed = 3.04 ft/sec with and w/o SPC; 09/26/22 - 2.79 ft/sec w/o AD, 2.78 ft/sec with SPC - gait w/o AD with wider BOS and mild ataxia  5. Patient will be able to ascend/descend stairs with 1 HR and reciprocal step pattern safely to access home and community.  Baseline:  Goal status: MET  09/26/22  6.  Patient will report 20/80 on LEFS to demonstrate improved functional ability. Baseline: LEFS 10/80 = 12.5% (87.5% disability) Goal status: IN PROGRESS  7.  Patient will demonstrate decreased TUG time to </= 13.5 sec to decrease risk for falls with transitional mobility Baseline: 23.93 sec w/o AD, 17.03 sec with SPC Goal status: MET  09/03/22 - TUG = 12.94 sec w/o AD; 10.85 sec with SPC; 09/26/22 - 11.56 sec w/o AD, 10.44 sec with SPC  9.  Patient will demonstrate at least 19/24 on DGI to decrease risk of falls. Baseline: DGI = 12/24 Goal status: IN PROGRESS 09/26/22 - 13/24    PLAN: PT FREQUENCY: 2x/week  PT DURATION: 4-6 weeks  PLANNED INTERVENTIONS: Therapeutic exercises, Therapeutic activity, Neuromuscular re-education, Balance training, Gait training, Patient/Family education, Self Care, Joint mobilization, Stair training, DME instructions, Dry Needling,  Electrical stimulation, Cryotherapy, Moist heat, Taping, Ultrasound, Ionotophoresis 4mg/ml Dexamethasone, Manual therapy, and Re-evaluation  PLAN FOR NEXT SESSION: hip flexibility; LE strengthening; balance/gait stability; postural and UE strengthening; hybrid therapy with 1 visit each land- and aquatic-based per week.   Frankie , PT MPT 09/30/2022, 11:23 AM 

## 2022-09-30 ENCOUNTER — Ambulatory Visit (HOSPITAL_BASED_OUTPATIENT_CLINIC_OR_DEPARTMENT_OTHER): Payer: BC Managed Care – PPO | Attending: Neurosurgery | Admitting: Physical Therapy

## 2022-09-30 ENCOUNTER — Encounter: Payer: BC Managed Care – PPO | Admitting: Physical Therapy

## 2022-09-30 ENCOUNTER — Encounter (HOSPITAL_BASED_OUTPATIENT_CLINIC_OR_DEPARTMENT_OTHER): Payer: Self-pay | Admitting: Physical Therapy

## 2022-09-30 DIAGNOSIS — G9589 Other specified diseases of spinal cord: Secondary | ICD-10-CM | POA: Insufficient documentation

## 2022-09-30 DIAGNOSIS — R262 Difficulty in walking, not elsewhere classified: Secondary | ICD-10-CM | POA: Diagnosis not present

## 2022-09-30 DIAGNOSIS — M4802 Spinal stenosis, cervical region: Secondary | ICD-10-CM | POA: Insufficient documentation

## 2022-09-30 DIAGNOSIS — M6281 Muscle weakness (generalized): Secondary | ICD-10-CM

## 2022-09-30 DIAGNOSIS — R200 Anesthesia of skin: Secondary | ICD-10-CM | POA: Insufficient documentation

## 2022-09-30 DIAGNOSIS — R269 Unspecified abnormalities of gait and mobility: Secondary | ICD-10-CM | POA: Diagnosis not present

## 2022-09-30 DIAGNOSIS — M4804 Spinal stenosis, thoracic region: Secondary | ICD-10-CM | POA: Diagnosis not present

## 2022-09-30 DIAGNOSIS — R270 Ataxia, unspecified: Secondary | ICD-10-CM | POA: Diagnosis not present

## 2022-09-30 DIAGNOSIS — R2681 Unsteadiness on feet: Secondary | ICD-10-CM

## 2022-09-30 DIAGNOSIS — R2689 Other abnormalities of gait and mobility: Secondary | ICD-10-CM

## 2022-09-30 DIAGNOSIS — M62838 Other muscle spasm: Secondary | ICD-10-CM | POA: Insufficient documentation

## 2022-09-30 DIAGNOSIS — M4712 Other spondylosis with myelopathy, cervical region: Secondary | ICD-10-CM | POA: Diagnosis not present

## 2022-09-30 DIAGNOSIS — M25552 Pain in left hip: Secondary | ICD-10-CM

## 2022-09-30 DIAGNOSIS — M48061 Spinal stenosis, lumbar region without neurogenic claudication: Secondary | ICD-10-CM | POA: Insufficient documentation

## 2022-10-03 NOTE — Telephone Encounter (Signed)
I called pt back and we discussed message. He sts he would like to schedule f/u with Dr. Krista Blue to discuss MRI before NCS/EMG(this is scheduled for December).  I have scheduled appt for 10/10/2022 with Dr. Krista Blue for review of scan.

## 2022-10-03 NOTE — Telephone Encounter (Signed)
Pt called stating that he is needing to speak to the RN regarding MRI. Please advise.

## 2022-10-04 ENCOUNTER — Ambulatory Visit: Payer: BC Managed Care – PPO

## 2022-10-04 DIAGNOSIS — R2689 Other abnormalities of gait and mobility: Secondary | ICD-10-CM

## 2022-10-04 DIAGNOSIS — R262 Difficulty in walking, not elsewhere classified: Secondary | ICD-10-CM | POA: Diagnosis not present

## 2022-10-04 DIAGNOSIS — R2681 Unsteadiness on feet: Secondary | ICD-10-CM

## 2022-10-04 DIAGNOSIS — M25552 Pain in left hip: Secondary | ICD-10-CM | POA: Diagnosis not present

## 2022-10-04 DIAGNOSIS — M6281 Muscle weakness (generalized): Secondary | ICD-10-CM

## 2022-10-04 NOTE — Therapy (Signed)
OUTPATIENT PHYSICAL THERAPY TREATMENT   Patient Name: Alex West MRN: [id number] DOB:06/07/1956, 66 y.o., male Today's Date: 10/04/2022      PT End of Session - 10/04/22 1019     Visit Number 10    Date for PT Re-Evaluation 11/07/22    Authorization Type [employer] - VL: 61 (7 used earlier this year)    Progress Note Due on Visit 18   Recert on visit #8 - 64/6/80   PT Start Time 0934    PT Stop Time 1015    PT Time Calculation (min) 41 min    Activity Tolerance Patient tolerated treatment well    Behavior During Therapy WFL for tasks assessed/performed                     Past Medical History:  Diagnosis Date   Arthritis    BPH (benign prostatic hyperplasia)    Colon polyp    ED (erectile dysfunction)    Elevated liver enzymes    Gallstones    Hepatitis    History of anemia    Hypercholesterolemia    Hypothyroidism    Mild tricuspid regurgitation    Mitral valve regurgitation    Thyroid disease    Vertigo    Past Surgical History:  Procedure Laterality Date   ANTERIOR CERVICAL DECOMP/DISCECTOMY FUSION N/A 07/23/2022   Procedure: Anterior Cervical Discectomy and Fusion Cervical Three-Four/Four-Five/Five-Six;  Surgeon: Vallarie Mare, MD;  Location: [place];  Service: Neurosurgery;  Laterality: N/A;  3C   FLEXIBLE SIGMOIDOSCOPY     Patient Active Problem List   Diagnosis Date Noted   Paresthesia 08/21/2022   Stenosis of cervical spine with myelopathy ([place]) 07/23/2022   Gait abnormality 06/18/2022   Cervical myelopathy ([place]) 06/17/2022   Weakness 06/06/2022   Neck pain 06/06/2022   OA (osteoarthritis) of hip 07/24/2021   Immunosuppressed status ([place]) 09/05/2014   Hepatitis, autoimmune ([place]) 09/05/2014    PCP: Lawerance Cruel, MD  REFERRING PROVIDER: Vallarie Mare, MD  REFERRING DIAG: [phone] (ICD-10-CM) - Cervical spondylosis with myelopathy  THERAPY DIAG:  Other abnormalities of gait and mobility  Muscle weakness  (generalized)  Difficulty in walking, not elsewhere classified  Unsteadiness on feet  Pain in left hip  RATIONALE FOR EVALUATION AND TREATMENT: Rehabilitation  ONSET DATE: 07/23/2022 - ACDF C3-4, C4-5, C5-6   NEXT MD VISIT: 09/06/22 - surgery f/u  SUBJECTIVE:   SUBJECTIVE STATEMENT: Pt notes some strain in UT area, pool therapy went well and he feels that it really helped his hip.  PAIN:  Are you having pain? No - L hip  Are you having pain? No - B shoulder and arms - just strain in UT area  PERTINENT HISTORY: ACDF C3-4, C4-5, C5-6 on 07/23/22; L hip pain - severe OA; arthiritis; BPH; anemia; hypothyroidism; MVR & TVR; thyroid disease; vertigo  PRECAUTIONS: None   WEIGHT BEARING RESTRICTIONS: No  FALLS:  Has patient fallen in last 6 months? No  LIVING ENVIRONMENT: Lives with: lives with their spouse Lives in: House/apartment Stairs: Yes: Internal: 14 steps; on right going up and External: 3 steps; on left going up Has following equipment at home: Single point cane and Walker - 2 wheeled  OCCUPATION: Retired  PLOF: Independent and Leisure: play golf, gym 5x/wk   PATIENT GOALS: "I want to get back to walking normally again."   OBJECTIVE:   DIAGNOSTIC FINDINGS:  09/18/22 - Cervical MRI: IMPRESSION: MRI cervical spine (without) demonstrating: - Anterior cervical discectomy  and fusion with metal hardware spanning C3, C4, C5 and C6 levels. - Moderate spinal stenosis at C3-4, C4-5 and C5-6 levels.  Mild myelomalacia noted at C4 level.  20 - Multilevel foraminal stenosis spanning C2-3 down to C7-T1 levels as above. - Compared to MRI from 06/13/2022 there have been interval surgical changes as above. Spinal stenosis has slightly improved from prior study. 09/18/22 - Thoracic MRI: IMPRESSION: MRI thoracic spine without contrast demonstrating: - At T10-11 broad disc bulging, facet and ligamentum flavum hypertrophy with mild spinal stenosis and mild bilateral foraminal stenosis;  subtle increased T2 signal within the spinal cord at T10 level (series 12 image 28, 29) may reflect mild spinal cord edema, syringomyelia or artifact. 09/18/22 - Lumbar MRI: IMPRESSION: MRI lumbar spine (without) demonstrating: - At L3-4: Disc bulging and facet vertebrae with moderate spinal stenosis and mild bilateral foraminal stenosis. - At L4-5: Disc bulging and facet hypertrophy with moderate spinal stenosis and mild bilateral foraminal stenosis. - At L5-S1: Disc bulging and facet hypertrophy with moderate right and mild left foraminal stenosis. - Lumbarization of S1 segment with transitional features.  Lowest visualized intervertebral disc level is labeled S1-2 and the study.  Note enumeration scheme if intervention is planned.  06/13/22 - Cervical MRI:  IMPRESSION: This MRI of the cervical spine without contrast shows the following: Increase signal within the spinal cord adjacent to C3-C4 consistent with compressive myelopathy. At C3-C4 and C5-C6 there is severe spinal stenosis, at C4-C5 there is moderate spinal stenosis and at C2-C3, C6-C7 and C7-T1 there is mild spinal stenosis. There are various degrees of foraminal narrowing at every level in the cervical spine.  This is severe at C3-C4 where there is probable bilateral C4 nerve root compression, and moderately severe at C4-C5 and C5-C6 where there is potential for bilateral C5 and C6 nerve root compression.  PATIENT SURVEYS:  NDI  23/50 = 46.0%, moderate disability LEFS 10/80 = 12.5%, 87.5% disability  COGNITION: Overall cognitive status: Within functional limits for tasks assessed     SENSATION: Light touch: Impaired  Intermittent numbness in R thumb and B hands, burning and tingling in L shoulder and UE  MUSCLE LENGTH: Hamstrings: mild/mod tight L>R ITB: mod tight B Piriformis: mod/severe tight L, mod tight R Hip flexors: mod tight L>R Quads: mod tight L>R  POSTURE:  No Significant postural limitations and cervical collar  in place  CERVICAL ROM:   Active ROM AROM (deg) 09/11/22  Flexion 32  Extension 44  Right lateral flexion 23  Left lateral flexion 13  Right rotation 52  Left rotation 51   (Blank rows = not tested)   UPPER EXTREMITY ROM: Deferred on eval d/t pt reported post-op precautions  Active ROM Right 09/11/22 Left 09/11/22 Right 09/26/22 Left 09/26/22  Shoulder flexion 131 139 124 124  Shoulder extension 36 39 38 32  Shoulder abduction 157 151 104 105  Shoulder adduction      Shoulder internal rotation FIR to L2 FIR to L1    Shoulder external rotation FER - unable to reach behind FER - unable to reach behind    Elbow flexion      Elbow extension      Wrist flexion      Wrist extension      Wrist ulnar deviation      Wrist radial deviation      Wrist pronation      Wrist supination      (Blank rows = not tested)  UPPER EXTREMITY MMT:  Deferred on eval d/t pt reported post-op precautions  MMT Right 09/11/22 Left 09/11/22 Right 09/26/22 Left 09/26/22  Shoulder flexion 4+ 4+ 4+ 4+  Shoulder extension 4 4- 4+ 4+  Shoulder abduction 4+ 4+ 4+ 4+  Shoulder adduction      Shoulder internal rotation 4+ 4+ 4+ 4+  Shoulder external rotation _0 Middle trapezius      Lower trapezius      Elbow flexion 5 4+ 5 5  Elbow extension 4+ 4+ 4+ 4+  Wrist flexion      Wrist extension      Wrist ulnar deviation      Wrist radial deviation      Wrist pronation      Wrist supination      Grip strength (lbs) 75 68    (Blank rows = not tested)  LOWER EXTREMITY ROM: B hip ROM mild to moderately limited in all planes with greatest limitation in L hip flexion and IR/ER  LOWER EXTREMITY MMT: Tested in sitting on eval; *standard testing positions except extension tested in sidelying on 09/03/22  MMT Right eval Left eval Right 09/03/22 Left 09/03/22 Right 09/26/22 Left 09/26/22  Hip flexion 4+ 4 5 4+ 4+ 4  Hip extension 4 4+ 4+ * 4 * 4- 4-  Hip abduction 4+ 4 4+ 4 4 seated 4- seated  Hip  adduction 4+ 4+ 5 4+ 4+ seated 4- seated  Hip internal rotation 4 4 4+ 4+ 4 4  Hip external rotation 4 4 4+ 4+ 4 4  Knee flexion _1 4+ 4+ 4+  Knee extension 4+ 4+ _2 4+  Ankle dorsiflexion 4 4+ 4+ 4+ 5 5  Ankle plantarflexion   5 5- 5- 20 SLS HR (Limited lift) 4 13 SLS HR  Ankle inversion        Ankle eversion         (Blank rows = not tested)  BED MOBILITY:  NT  TRANSFERS: Assistive device utilized: None  Sit to stand: Modified independence Stand to sit: Modified independence Chair to chair:  NT Floor:  NT  GAIT: Distance walked: 60 ft Assistive device utilized: Single point cane Level of assistance: Modified independence Gait pattern: step through pattern, decreased stride length, decreased hip/knee flexion- Right, decreased hip/knee flexion- Left, lateral hip instability, and decreased trunk rotation Comments: 1.89 ft/sec gait speed = limited community ambulator  RAMP: Level of Assistance:  NT Assistive device utilized:  NT Ramp Comments:   CURB:  Level of Assistance:  NT Assistive device utilized:  NT Curb Comments:   STAIRS:  Level of Assistance: SBA  Stair Negotiation Technique: Step to Pattern with Single Rail on Right  Number of Stairs: 14   Height of Stairs: 7  Comments: leads with R LE  FUNCTIONAL TESTS:  5 times sit to stand: 16.63 sec; >15 sec = recurrent fall risk Timed up and go (TUG): 23.93 sec w/o AD, 17.03 sec with SPC; >13.5 sec = high fall risk 10 meter walk test: 17.38 sec with SPC - gait speed = 1.89 ft/sec; ,1.8 ft/sec = risk for recurrent falls Dynamic Gait Index: 12/24 - Scores of 19 or less are predictive of falls in older community living adults   TODAY'S TREATMENT: 10/04/22 Therapeutic Exercise: UBE L1 3 min fwd/ 3 min back Standing open book x 10 bil at wall Standing lumbar ext wall x 10 Wall slides AAROM prayer hands x 10 with slight hold at top CW/CCW  ball on wall x 10 each UE Seated green pball slides into shoulder  flexion x 10 bil Seated horiz ABD and ER green TB x 10 4 way reach with opp LE SLS 5x  09/26/22 THERAPEUTIC EXERCISE: to improve flexibility, strength and mobility.  Verbal and tactile cues throughout for technique. Rec Bike L3 x 7 min   THERAPEUTIC ACTIVITIES: ROM MMT 5 times sit to stand: 12.84 sec Timed up and go (TUG): 11.56 sec w/o AD, 10.44 sec with SPC 10 meter walk test: 11.74 sec w/o AD, 11.81 with SPC - gait speed = 2.79 ft/sec w/o AD, 2.78 ft/sec with SPC - gait w/o AD with wider BOS and mild ataxia Dynamic Gait Index: 13/24 - Scores of 19 or less are predictive of falls in older community living adults   09/23/22 THERAPEUTIC EXERCISE: to improve flexibility, strength and mobility.  Verbal and tactile cues throughout for technique. Rec Bike L3 x 7 min  Bridge + GTB clam 10 x 3", 2 sets Sustained bridge + GTB march x 10 R/L sidelying GTB clam 10 x 3" Hooklying dead bug 10 x 3" TrA + sequential 90/90 march 10 x 3" R/L single leg bridge 10 x 3" Hooklying TrA + GTB B shoulder extension 10 x 3"  Hooklying TrA + GTB B scap retraction + shoulder horiz ABD 10 x 3" Hooklying TrA + GTB B scap retraction + shoulder horiz ABD diagonals 10 x 3"  Quadruped alt UE & LE raises x 10 each L mod thomas hip flexor stretch 3 x 30-60"     PATIENT EDUCATION:  Education details:  resume hip stretches from prior PT episode Person educated: Patient Education method: Explanation Education comprehension: verbalized understanding  HOME EXERCISE PROGRAM: Access Code: [email or web address] URL: [email or web address] Date: 09/11/2022 Prepared by: Annie Paras  Exercises - Seated Table Hamstring Stretch  - 2 x daily - 7 x weekly - 2 sets - 30 sec hold - Supine Figure 4 Piriformis Stretch  - 2 x daily - 7 x weekly - 2 sets - 30 sec hold - Supine Piriformis Stretch with Foot on Ground  - 2 x daily - 7 x weekly - 2 sets - 30 sec hold - Sit to Stand  - 1 x daily - 3 x weekly - 3 sets - 10  reps - Standing Hip Abduction with Resistance at Ankles and Counter Support  - 1 x daily - 3 x weekly - 2 sets - 10 reps - 3 sec hold - Standing Hip Extension with Resistance at Ankles and Counter Support  - 1 x daily - 3 x weekly - 2 sets - 10 reps - 3 sec hold - Marching with Resistance  - 1 x daily - 3 x weekly - 2 sets - 10 reps - 3 sec hold - Standing Hamstring Curl with Resistance  - 1 x daily - 3 x weekly - 2 sets - 10 reps - 3 sec hold - Standing Shoulder Row with Anchored Resistance  - 1 x daily - 3-4 x weekly - 2 sets - 10 reps - 5 sec hold - Scapular Retraction with Resistance Advanced  - 1 x daily - 3-4 x weekly - 2 sets - 10 reps - 5 sec hold - Shoulder External Rotation and Scapular Retraction with Resistance  - 1 x daily - 3 x weekly - 2 sets - 10 reps - 3-5 sec hold   ASSESSMENT:  CLINICAL IMPRESSION: Pt responded well. Continued to progress with UE strengthening focusing  on deltoids as he has issues with holding steering wheel while driving. He did become fatigued with the ball circles on wall. We also work on a little balance to improve stability and proprioceptive responses to improve balance.  OBJECTIVE IMPAIRMENTS: Abnormal gait, decreased activity tolerance, decreased balance, decreased endurance, decreased mobility, difficulty walking, decreased strength, decreased safety awareness, increased fascial restrictions, impaired perceived functional ability, increased muscle spasms, impaired flexibility, impaired sensation, improper body mechanics, postural dysfunction, and pain.   ACTIVITY LIMITATIONS: carrying, lifting, bending, sitting, standing, squatting, sleeping, stairs, transfers, bed mobility, bathing, toileting, dressing, reach over head, hygiene/grooming, locomotion level, and caring for others  PARTICIPATION LIMITATIONS: meal prep, cleaning, laundry, driving, community activity, and yard work  PERSONAL FACTORS: Fitness, Past/current experiences, Time since onset of  injury/illness/exacerbation, Transportation, and 3+ comorbidities: ACDF C3-4, C4-5, C5-6 on 07/23/22; L hip pain - severe OA; arthiritis; BPH; anemia; hypothyroidism; MVR & TVR; thyroid disease; vertigo  are also affecting patient's functional outcome.   REHAB POTENTIAL: Good  CLINICAL DECISION MAKING: Evolving/moderate complexity  EVALUATION COMPLEXITY: Moderate   GOALS: Goals reviewed with patient? Yes  SHORT TERM GOALS: Target date: 09/09/2022   Patient will be independent with initial HEP. Baseline:  Goal status: MET  09/03/22  2.  Patient will improve 5x STS time to </= 15 seconds for improved efficiency and safety with transfers Baseline: 16.63 sec Goal status: MET  09/04/11 - 5xSTS = 14.50 sec; 09/26/22 - 12.84 sec  LONG TERM GOALS: Target date: 09/30/2022; extended to 11/07/22  Patient will be independent with advanced/ongoing HEP to improve outcomes and carryover.  Baseline:  Goal status: IN PROGRESS  09/26/22 - met for current HEP  2.  Patient will demonstrate improved B LE strength to >/= 4+/5 for improved stability and ease of mobility. Baseline:  Goal status: IN PROGRESS  09/26/22 - Refer to above MMT table - strength fluctuating from last testing   3.  Patient will be able to ambulate 600' with LRAD and normal gait pattern across various surfaces to access community.  Baseline:  Goal status: IN PROGRESS  4.  Patient will improve gait velocity to at least 2.62 ft/sec for improved gait efficiency and safety as a community ambulator. Baseline:  Goal status: MET  09/03/22 - Gait speed = 3.04 ft/sec with and w/o SPC; 09/26/22 - 2.79 ft/sec w/o AD, 2.78 ft/sec with SPC - gait w/o AD with wider BOS and mild ataxia  5. Patient will be able to ascend/descend stairs with 1 HR and reciprocal step pattern safely to access home and community.  Baseline:  Goal status: MET  09/26/22  6.  Patient will report 20/80 on LEFS to demonstrate improved functional ability. Baseline: LEFS 10/80  = 12.5% (87.5% disability) Goal status: IN PROGRESS  7.  Patient will demonstrate decreased TUG time to </= 13.5 sec to decrease risk for falls with transitional mobility Baseline: 23.93 sec w/o AD, 17.03 sec with SPC Goal status: MET  09/03/22 - TUG = 12.94 sec w/o AD; 10.85 sec with SPC; 09/26/22 - 11.56 sec w/o AD, 10.44 sec with SPC  9.  Patient will demonstrate at least 19/24 on DGI to decrease risk of falls. Baseline: DGI = 12/24 Goal status: IN PROGRESS 09/26/22 - 13/24    PLAN: PT FREQUENCY: 2x/week  PT DURATION: 4-6 weeks  PLANNED INTERVENTIONS: Therapeutic exercises, Therapeutic activity, Neuromuscular re-education, Balance training, Gait training, Patient/Family education, Self Care, Joint mobilization, Stair training, DME instructions, Dry Needling, Electrical stimulation, Cryotherapy, Moist heat, Taping, Ultrasound, Ionotophoresis  94m/ml Dexamethasone, Manual therapy, and Re-evaluation  PLAN FOR NEXT SESSION: hip flexibility; LE strengthening; balance/gait stability; postural and UE strengthening; hybrid therapy with 1 visit each land- and aquatic-based per week.   BArtist Pais PTA 10/04/2022, 10:19 AM

## 2022-10-06 NOTE — Therapy (Signed)
OUTPATIENT PHYSICAL THERAPY TREATMENT   Patient Name: Alex West MRN: [id number] DOB:12/26/1955, 66 y.o., male Today's Date: 10/07/2022      PT End of Session - 10/07/22 0949     Visit Number 11    Date for PT Re-Evaluation 11/07/22    Authorization Type [employer] - VL: 55 (7 used earlier this year)    Progress Note Due on Visit 18   Recert on visit #8 - 35/4/56   PT Start Time 0948    PT Stop Time 1030    PT Time Calculation (min) 42 min    Activity Tolerance Patient tolerated treatment well    Behavior During Therapy WFL for tasks assessed/performed                      Past Medical History:  Diagnosis Date   Arthritis    BPH (benign prostatic hyperplasia)    Colon polyp    ED (erectile dysfunction)    Elevated liver enzymes    Gallstones    Hepatitis    History of anemia    Hypercholesterolemia    Hypothyroidism    Mild tricuspid regurgitation    Mitral valve regurgitation    Thyroid disease    Vertigo    Past Surgical History:  Procedure Laterality Date   ANTERIOR CERVICAL DECOMP/DISCECTOMY FUSION N/A 07/23/2022   Procedure: Anterior Cervical Discectomy and Fusion Cervical Three-Four/Four-Five/Five-Six;  Surgeon: Vallarie Mare, MD;  Location: [place];  Service: Neurosurgery;  Laterality: N/A;  3C   FLEXIBLE SIGMOIDOSCOPY     Patient Active Problem List   Diagnosis Date Noted   Paresthesia 08/21/2022   Stenosis of cervical spine with myelopathy ([place]) 07/23/2022   Gait abnormality 06/18/2022   Cervical myelopathy ([place]) 06/17/2022   Weakness 06/06/2022   Neck pain 06/06/2022   OA (osteoarthritis) of hip 07/24/2021   Immunosuppressed status ([place]) 09/05/2014   Hepatitis, autoimmune ([place]) 09/05/2014    PCP: Lawerance Cruel, MD  REFERRING PROVIDER: Vallarie Mare, MD  REFERRING DIAG: [phone] (ICD-10-CM) - Cervical spondylosis with myelopathy  THERAPY DIAG:  Other abnormalities of gait and mobility  Muscle weakness  (generalized)  Difficulty in walking, not elsewhere classified  Unsteadiness on feet  Pain in left hip  RATIONALE FOR EVALUATION AND TREATMENT: Rehabilitation  ONSET DATE: 07/23/2022 - ACDF C3-4, C4-5, C5-6   NEXT MD VISIT: 09/06/22 - surgery f/u  SUBJECTIVE:   SUBJECTIVE STATEMENT: Pt reports fatigue after last pool session slept a few hours afterward.  No pain  PAIN:  Are you having pain? No - L hip  Are you having pain? No - B shoulder and arms - just strain in UT area  PERTINENT HISTORY: ACDF C3-4, C4-5, C5-6 on 07/23/22; L hip pain - severe OA; arthiritis; BPH; anemia; hypothyroidism; MVR & TVR; thyroid disease; vertigo  PRECAUTIONS: None   WEIGHT BEARING RESTRICTIONS: No  FALLS:  Has patient fallen in last 6 months? No  LIVING ENVIRONMENT: Lives with: lives with their spouse Lives in: House/apartment Stairs: Yes: Internal: 14 steps; on right going up and External: 3 steps; on left going up Has following equipment at home: Single point cane and Walker - 2 wheeled  OCCUPATION: Retired  PLOF: Independent and Leisure: play golf, gym 5x/wk   PATIENT GOALS: "I want to get back to walking normally again."   OBJECTIVE:   DIAGNOSTIC FINDINGS:  09/18/22 - Cervical MRI: IMPRESSION: MRI cervical spine (without) demonstrating: - Anterior cervical discectomy and fusion with metal  hardware spanning C3, C4, C5 and C6 levels. - Moderate spinal stenosis at C3-4, C4-5 and C5-6 levels.  Mild myelomalacia noted at C4 level.  20 - Multilevel foraminal stenosis spanning C2-3 down to C7-T1 levels as above. - Compared to MRI from 06/13/2022 there have been interval surgical changes as above. Spinal stenosis has slightly improved from prior study. 09/18/22 - Thoracic MRI: IMPRESSION: MRI thoracic spine without contrast demonstrating: - At T10-11 broad disc bulging, facet and ligamentum flavum hypertrophy with mild spinal stenosis and mild bilateral foraminal stenosis; subtle increased  T2 signal within the spinal cord at T10 level (series 12 image 28, 29) may reflect mild spinal cord edema, syringomyelia or artifact. 09/18/22 - Lumbar MRI: IMPRESSION: MRI lumbar spine (without) demonstrating: - At L3-4: Disc bulging and facet vertebrae with moderate spinal stenosis and mild bilateral foraminal stenosis. - At L4-5: Disc bulging and facet hypertrophy with moderate spinal stenosis and mild bilateral foraminal stenosis. - At L5-S1: Disc bulging and facet hypertrophy with moderate right and mild left foraminal stenosis. - Lumbarization of S1 segment with transitional features.  Lowest visualized intervertebral disc level is labeled S1-2 and the study.  Note enumeration scheme if intervention is planned.  06/13/22 - Cervical MRI:  IMPRESSION: This MRI of the cervical spine without contrast shows the following: Increase signal within the spinal cord adjacent to C3-C4 consistent with compressive myelopathy. At C3-C4 and C5-C6 there is severe spinal stenosis, at C4-C5 there is moderate spinal stenosis and at C2-C3, C6-C7 and C7-T1 there is mild spinal stenosis. There are various degrees of foraminal narrowing at every level in the cervical spine.  This is severe at C3-C4 where there is probable bilateral C4 nerve root compression, and moderately severe at C4-C5 and C5-C6 where there is potential for bilateral C5 and C6 nerve root compression.  PATIENT SURVEYS:  NDI  23/50 = 46.0%, moderate disability LEFS 10/80 = 12.5%, 87.5% disability  COGNITION: Overall cognitive status: Within functional limits for tasks assessed     SENSATION: Light touch: Impaired  Intermittent numbness in R thumb and B hands, burning and tingling in L shoulder and UE  MUSCLE LENGTH: Hamstrings: mild/mod tight L>R ITB: mod tight B Piriformis: mod/severe tight L, mod tight R Hip flexors: mod tight L>R Quads: mod tight L>R  POSTURE:  No Significant postural limitations and cervical collar in  place  CERVICAL ROM:   Active ROM AROM (deg) 09/11/22  Flexion 32  Extension 44  Right lateral flexion 23  Left lateral flexion 13  Right rotation 52  Left rotation 51   (Blank rows = not tested)   UPPER EXTREMITY ROM: Deferred on eval d/t pt reported post-op precautions  Active ROM Right 09/11/22 Left 09/11/22 Right 09/26/22 Left 09/26/22  Shoulder flexion 131 139 124 124  Shoulder extension 36 39 38 32  Shoulder abduction 157 151 104 105  Shoulder adduction      Shoulder internal rotation FIR to L2 FIR to L1    Shoulder external rotation FER - unable to reach behind FER - unable to reach behind    Elbow flexion      Elbow extension      Wrist flexion      Wrist extension      Wrist ulnar deviation      Wrist radial deviation      Wrist pronation      Wrist supination      (Blank rows = not tested)  UPPER EXTREMITY MMT:  Deferred on eval  d/t pt reported post-op precautions  MMT Right 09/11/22 Left 09/11/22 Right 09/26/22 Left 09/26/22  Shoulder flexion 4+ 4+ 4+ 4+  Shoulder extension 4 4- 4+ 4+  Shoulder abduction 4+ 4+ 4+ 4+  Shoulder adduction      Shoulder internal rotation 4+ 4+ 4+ 4+  Shoulder external rotation $RemoveBeforeDEI'4 4 4 4  'PCuqWffDtGYopJtS$ Middle trapezius      Lower trapezius      Elbow flexion 5 4+ 5 5  Elbow extension 4+ 4+ 4+ 4+  Wrist flexion      Wrist extension      Wrist ulnar deviation      Wrist radial deviation      Wrist pronation      Wrist supination      Grip strength (lbs) 75 68    (Blank rows = not tested)  LOWER EXTREMITY ROM: B hip ROM mild to moderately limited in all planes with greatest limitation in L hip flexion and IR/ER  LOWER EXTREMITY MMT: Tested in sitting on eval; *standard testing positions except extension tested in sidelying on 09/03/22  MMT Right eval Left eval Right 09/03/22 Left 09/03/22 Right 09/26/22 Left 09/26/22  Hip flexion 4+ 4 5 4+ 4+ 4  Hip extension 4 4+ 4+ * 4 * 4- 4-  Hip abduction 4+ 4 4+ 4 4 seated 4- seated  Hip  adduction 4+ 4+ 5 4+ 4+ seated 4- seated  Hip internal rotation 4 4 4+ 4+ 4 4  Hip external rotation 4 4 4+ 4+ 4 4  Knee flexion $RemoveBefo'4 4 5 'uXBbWyrNwTL$ 4+ 4+ 4+  Knee extension 4+ 4+ $Rem'5 5 5 'CTrR$ 4+  Ankle dorsiflexion 4 4+ 4+ 4+ 5 5  Ankle plantarflexion   5 5- 5- 20 SLS HR (Limited lift) 4 13 SLS HR  Ankle inversion        Ankle eversion         (Blank rows = not tested)  BED MOBILITY:  NT  TRANSFERS: Assistive device utilized: None  Sit to stand: Modified independence Stand to sit: Modified independence Chair to chair:  NT Floor:  NT  GAIT: Distance walked: 60 ft Assistive device utilized: Single point cane Level of assistance: Modified independence Gait pattern: step through pattern, decreased stride length, decreased hip/knee flexion- Right, decreased hip/knee flexion- Left, lateral hip instability, and decreased trunk rotation Comments: 1.89 ft/sec gait speed = limited community ambulator  RAMP: Level of Assistance:  NT Assistive device utilized:  NT Ramp Comments:   CURB:  Level of Assistance:  NT Assistive device utilized:  NT Curb Comments:   STAIRS:  Level of Assistance: SBA  Stair Negotiation Technique: Step to Pattern with Single Rail on Right  Number of Stairs: 14   Height of Stairs: 7  Comments: leads with R LE  FUNCTIONAL TESTS:  5 times sit to stand: 16.63 sec; >15 sec = recurrent fall risk Timed up and go (TUG): 23.93 sec w/o AD, 17.03 sec with SPC; >13.5 sec = high fall risk 10 meter walk test: 17.38 sec with SPC - gait speed = 1.89 ft/sec; ,1.8 ft/sec = risk for recurrent falls Dynamic Gait Index: 12/24 - Scores of 19 or less are predictive of falls in older community living adults   TODAY'S TREATMENT:  10/07/22   Pt seen for aquatic therapy today.  Treatment took place in water 3.25-4.5 ft in depth at the [facility]. Temp of water was 91.  Pt entered/exited the pool via stairs step to pattern with  hand rail.                           *Gait  training/Walking forward, back and side stepping with cues for heel strike, toe off and step length/marching  *Side lunges with yellow hand buoys x 4 widths, Ue add/abd *UE support on yellow hand buoys hip circles; df; df; add/abd    - add/abd; hip ext squats x 10 *bouncing BB off wall x 20 rep *4 ft heel walking then toe walking 2 widths ea *Cycling on noodle with hand buoys for " handlebars".     Pt requires the buoyancy and hydrostatic pressure of water for support, and to offload joints by unweighting joint load by at least 50 % in navel deep water and by at least 75-80% in chest to neck deep water.  Viscosity of the water is needed for resistance of strengthening. Water current perturbations provides challenge to standing balance requiring increased core activation.    10/04/22 Therapeutic Exercise: UBE L1 3 min fwd/ 3 min back Standing open book x 10 bil at wall Standing lumbar ext wall x 10 Wall slides AAROM prayer hands x 10 with slight hold at top CW/CCW ball on wall x 10 each UE Seated green pball slides into shoulder flexion x 10 bil Seated horiz ABD and ER green TB x 10 4 way reach with opp LE SLS 5x  09/26/22 THERAPEUTIC EXERCISE: to improve flexibility, strength and mobility.  Verbal and tactile cues throughout for technique. Rec Bike L3 x 7 min   THERAPEUTIC ACTIVITIES: ROM MMT 5 times sit to stand: 12.84 sec Timed up and go (TUG): 11.56 sec w/o AD, 10.44 sec with SPC 10 meter walk test: 11.74 sec w/o AD, 11.81 with SPC - gait speed = 2.79 ft/sec w/o AD, 2.78 ft/sec with SPC - gait w/o AD with wider BOS and mild ataxia Dynamic Gait Index: 13/24 - Scores of 19 or less are predictive of falls in older community living adults   09/23/22 THERAPEUTIC EXERCISE: to improve flexibility, strength and mobility.  Verbal and tactile cues throughout for technique. Rec Bike L3 x 7 min  Bridge + GTB clam 10 x 3", 2 sets Sustained bridge + GTB march x 10 R/L sidelying GTB clam  10 x 3" Hooklying dead bug 10 x 3" TrA + sequential 90/90 march 10 x 3" R/L single leg bridge 10 x 3" Hooklying TrA + GTB B shoulder extension 10 x 3"  Hooklying TrA + GTB B scap retraction + shoulder horiz ABD 10 x 3" Hooklying TrA + GTB B scap retraction + shoulder horiz ABD diagonals 10 x 3"  Quadruped alt UE & LE raises x 10 each L mod thomas hip flexor stretch 3 x 30-60"     PATIENT EDUCATION:  Education details:  resume hip stretches from prior PT episode Person educated: Patient Education method: Explanation Education comprehension: verbalized understanding  HOME EXERCISE PROGRAM: Access Code: [email or web address] URL: [email or web address] Date: 09/11/2022 Prepared by: Annie Paras  Exercises - Seated Table Hamstring Stretch  - 2 x daily - 7 x weekly - 2 sets - 30 sec hold - Supine Figure 4 Piriformis Stretch  - 2 x daily - 7 x weekly - 2 sets - 30 sec hold - Supine Piriformis Stretch with Foot on Ground  - 2 x daily - 7 x weekly - 2 sets - 30 sec hold - Sit to Stand  - 1 x daily - 3  x weekly - 3 sets - 10 reps - Standing Hip Abduction with Resistance at Ankles and Counter Support  - 1 x daily - 3 x weekly - 2 sets - 10 reps - 3 sec hold - Standing Hip Extension with Resistance at Ankles and Counter Support  - 1 x daily - 3 x weekly - 2 sets - 10 reps - 3 sec hold - Marching with Resistance  - 1 x daily - 3 x weekly - 2 sets - 10 reps - 3 sec hold - Standing Hamstring Curl with Resistance  - 1 x daily - 3 x weekly - 2 sets - 10 reps - 3 sec hold - Standing Shoulder Row with Anchored Resistance  - 1 x daily - 3-4 x weekly - 2 sets - 10 reps - 5 sec hold - Scapular Retraction with Resistance Advanced  - 1 x daily - 3-4 x weekly - 2 sets - 10 reps - 5 sec hold - Shoulder External Rotation and Scapular Retraction with Resistance  - 1 x daily - 3 x weekly - 2 sets - 10 reps - 3-5 sec hold   ASSESSMENT:  CLINICAL IMPRESSION: Pt reports high fatigue level all day Sunday  after having a busy Sat.  He is encouraged to allow his body to rest/recover when fatigued. He reports compliance with HEP including use of home equipment. Pt tolerates increased progression with core strengthening with some focus on right hip stabilization. He completes without discomfort. Will plan on cycling in lap pool next visit to take advantage of added depth. Goal ongoing.   OBJECTIVE IMPAIRMENTS: Abnormal gait, decreased activity tolerance, decreased balance, decreased endurance, decreased mobility, difficulty walking, decreased strength, decreased safety awareness, increased fascial restrictions, impaired perceived functional ability, increased muscle spasms, impaired flexibility, impaired sensation, improper body mechanics, postural dysfunction, and pain.   ACTIVITY LIMITATIONS: carrying, lifting, bending, sitting, standing, squatting, sleeping, stairs, transfers, bed mobility, bathing, toileting, dressing, reach over head, hygiene/grooming, locomotion level, and caring for others  PARTICIPATION LIMITATIONS: meal prep, cleaning, laundry, driving, community activity, and yard work  PERSONAL FACTORS: Fitness, Past/current experiences, Time since onset of injury/illness/exacerbation, Transportation, and 3+ comorbidities: ACDF C3-4, C4-5, C5-6 on 07/23/22; L hip pain - severe OA; arthiritis; BPH; anemia; hypothyroidism; MVR & TVR; thyroid disease; vertigo  are also affecting patient's functional outcome.   REHAB POTENTIAL: Good  CLINICAL DECISION MAKING: Evolving/moderate complexity  EVALUATION COMPLEXITY: Moderate   GOALS: Goals reviewed with patient? Yes  SHORT TERM GOALS: Target date: 09/09/2022   Patient will be independent with initial HEP. Baseline:  Goal status: MET  09/03/22  2.  Patient will improve 5x STS time to </= 15 seconds for improved efficiency and safety with transfers Baseline: 16.63 sec Goal status: MET  09/04/11 - 5xSTS = 14.50 sec; 09/26/22 - 12.84 sec  LONG TERM  GOALS: Target date: 09/30/2022; extended to 11/07/22  Patient will be independent with advanced/ongoing HEP to improve outcomes and carryover.  Baseline:  Goal status: IN PROGRESS  09/26/22 - met for current HEP  2.  Patient will demonstrate improved B LE strength to >/= 4+/5 for improved stability and ease of mobility. Baseline:  Goal status: IN PROGRESS  09/26/22 - Refer to above MMT table - strength fluctuating from last testing   3.  Patient will be able to ambulate 600' with LRAD and normal gait pattern across various surfaces to access community.  Baseline:  Goal status: IN PROGRESS  4.  Patient will improve gait velocity to  at least 2.62 ft/sec for improved gait efficiency and safety as a community ambulator. Baseline:  Goal status: MET  09/03/22 - Gait speed = 3.04 ft/sec with and w/o SPC; 09/26/22 - 2.79 ft/sec w/o AD, 2.78 ft/sec with SPC - gait w/o AD with wider BOS and mild ataxia  5. Patient will be able to ascend/descend stairs with 1 HR and reciprocal step pattern safely to access home and community.  Baseline:  Goal status: MET  09/26/22  6.  Patient will report 20/80 on LEFS to demonstrate improved functional ability. Baseline: LEFS 10/80 = 12.5% (87.5% disability) Goal status: IN PROGRESS  7.  Patient will demonstrate decreased TUG time to </= 13.5 sec to decrease risk for falls with transitional mobility Baseline: 23.93 sec w/o AD, 17.03 sec with SPC Goal status: MET  09/03/22 - TUG = 12.94 sec w/o AD; 10.85 sec with SPC; 09/26/22 - 11.56 sec w/o AD, 10.44 sec with SPC  9.  Patient will demonstrate at least 19/24 on DGI to decrease risk of falls. Baseline: DGI = 12/24 Goal status: IN PROGRESS 09/26/22 - 13/24    PLAN: PT FREQUENCY: 2x/week  PT DURATION: 4-6 weeks  PLANNED INTERVENTIONS: Therapeutic exercises, Therapeutic activity, Neuromuscular re-education, Balance training, Gait training, Patient/Family education, Self Care, Joint mobilization, Stair training, DME  instructions, Dry Needling, Electrical stimulation, Cryotherapy, Moist heat, Taping, Ultrasound, Ionotophoresis 4mg /ml Dexamethasone, Manual therapy, and Re-evaluation  PLAN FOR NEXT SESSION: hip flexibility; LE strengthening; balance/gait stability; postural and UE strengthening; hybrid therapy with 1 visit each land- and aquatic-based per week.   Denton Meek, PT MPT 10/07/2022, 10:03 AM

## 2022-10-07 ENCOUNTER — Encounter (HOSPITAL_BASED_OUTPATIENT_CLINIC_OR_DEPARTMENT_OTHER): Payer: Self-pay | Admitting: Physical Therapy

## 2022-10-07 ENCOUNTER — Ambulatory Visit (HOSPITAL_BASED_OUTPATIENT_CLINIC_OR_DEPARTMENT_OTHER): Payer: BC Managed Care – PPO | Admitting: Physical Therapy

## 2022-10-07 DIAGNOSIS — M4802 Spinal stenosis, cervical region: Secondary | ICD-10-CM | POA: Diagnosis not present

## 2022-10-07 DIAGNOSIS — R2689 Other abnormalities of gait and mobility: Secondary | ICD-10-CM

## 2022-10-07 DIAGNOSIS — G9589 Other specified diseases of spinal cord: Secondary | ICD-10-CM | POA: Diagnosis not present

## 2022-10-07 DIAGNOSIS — R269 Unspecified abnormalities of gait and mobility: Secondary | ICD-10-CM | POA: Diagnosis not present

## 2022-10-07 DIAGNOSIS — M25552 Pain in left hip: Secondary | ICD-10-CM

## 2022-10-07 DIAGNOSIS — R262 Difficulty in walking, not elsewhere classified: Secondary | ICD-10-CM | POA: Diagnosis not present

## 2022-10-07 DIAGNOSIS — M4712 Other spondylosis with myelopathy, cervical region: Secondary | ICD-10-CM | POA: Diagnosis not present

## 2022-10-07 DIAGNOSIS — M4804 Spinal stenosis, thoracic region: Secondary | ICD-10-CM | POA: Diagnosis not present

## 2022-10-07 DIAGNOSIS — M62838 Other muscle spasm: Secondary | ICD-10-CM | POA: Diagnosis not present

## 2022-10-07 DIAGNOSIS — M6281 Muscle weakness (generalized): Secondary | ICD-10-CM

## 2022-10-07 DIAGNOSIS — R2681 Unsteadiness on feet: Secondary | ICD-10-CM

## 2022-10-07 DIAGNOSIS — R200 Anesthesia of skin: Secondary | ICD-10-CM | POA: Diagnosis not present

## 2022-10-07 DIAGNOSIS — R270 Ataxia, unspecified: Secondary | ICD-10-CM | POA: Diagnosis not present

## 2022-10-07 DIAGNOSIS — M48061 Spinal stenosis, lumbar region without neurogenic claudication: Secondary | ICD-10-CM | POA: Diagnosis not present

## 2022-10-10 ENCOUNTER — Telehealth: Payer: Self-pay | Admitting: Neurology

## 2022-10-10 ENCOUNTER — Ambulatory Visit: Payer: BC Managed Care – PPO

## 2022-10-10 ENCOUNTER — Ambulatory Visit (INDEPENDENT_AMBULATORY_CARE_PROVIDER_SITE_OTHER): Payer: BC Managed Care – PPO | Admitting: Neurology

## 2022-10-10 ENCOUNTER — Encounter: Payer: Self-pay | Admitting: Neurology

## 2022-10-10 VITALS — BP 124/76 | HR 65 | Ht 75.0 in | Wt 243.0 lb

## 2022-10-10 DIAGNOSIS — R2689 Other abnormalities of gait and mobility: Secondary | ICD-10-CM

## 2022-10-10 DIAGNOSIS — G959 Disease of spinal cord, unspecified: Secondary | ICD-10-CM

## 2022-10-10 DIAGNOSIS — R202 Paresthesia of skin: Secondary | ICD-10-CM

## 2022-10-10 DIAGNOSIS — R262 Difficulty in walking, not elsewhere classified: Secondary | ICD-10-CM | POA: Diagnosis not present

## 2022-10-10 DIAGNOSIS — M6281 Muscle weakness (generalized): Secondary | ICD-10-CM | POA: Diagnosis not present

## 2022-10-10 DIAGNOSIS — R2681 Unsteadiness on feet: Secondary | ICD-10-CM

## 2022-10-10 DIAGNOSIS — M25552 Pain in left hip: Secondary | ICD-10-CM

## 2022-10-10 DIAGNOSIS — R531 Weakness: Secondary | ICD-10-CM

## 2022-10-10 DIAGNOSIS — R269 Unspecified abnormalities of gait and mobility: Secondary | ICD-10-CM

## 2022-10-10 MED ORDER — OXYBUTYNIN CHLORIDE ER 5 MG PO TB24: 5.0000 mg | ORAL_TABLET | Freq: Every day | ORAL | 11 refills | Status: DC

## 2022-10-10 NOTE — Telephone Encounter (Addendum)
Get all his MRI spines on CD for him to take to neurosurgeon at [facility], and call patient to pick it up

## 2022-10-10 NOTE — Progress Notes (Signed)
Chief Complaint  Patient presents with   Follow-up    Rm 15 alone- Pt reports several questions  about MRI.       ASSESSMENT AND PLAN  Alex West is a 66 y.o. male  Cervical stenosis  Presurgically he has severe stenosis at C3-4, C5-6, moderate at C4-5 level, evidence of cord signal abnormality,  Status post anterior C3-6 decompression fusion on July 23, 2022, postsurgically, complains of worsening gait abnormality, upper extremity paresthesia, weakness, Postsurgical MRI of cervical spine showed complete moderate cervical spinal stenosis C3-6 level, myelomalacia at C4, only slight improvement compared to presurgical MRI cervical spine We will refer him to [facility] neurosurgeon for second opinion  Lumbar moderate stenosis at L3-4, L4-5 level Paresthesia of bilateral upper extremity  Gabapentin 300 mg up to 3 times a day as needed  EMG nerve conduction study to rule out upper extremity neuropathy.  DIAGNOSTIC DATA (LABS, IMAGING, TESTING) - I reviewed patient records, labs, notes, testing and imaging myself where available.   MEDICAL HISTORY:  Alex West is a 66 year old male, seen in request by his primary care doctor Lawerance Cruel, for evaluation of bilateral arm numbness, weakness right worse than left  I reviewed and summarized the referring note.PMHX Hypothyroidism Hyperlipidemia Mitral valve regurgitation by echo Elevated liver enzymes in December 2013, ultrasound showed fatty infiltration of liver, with positive ANA, positive actin and antimitochondrial chondral antibody, negative hepatitis ABC, normal ferritin, was seen by [place] hepatologist, liver biopsy in March 2014, chronic inflammation consistent with autoimmune hepatitis, good response to budesonide, transition to Imuran September 2015, he is currently taking Imuran 50 mg 1 and half tablets daily  Since the beginning of 2023, he noticed bilateral arm numbness, initially had neck pain  radiating pain to left shoulder, now achiness sensation, bilateral shoulder, upper extremity, persistent numbness of right thumb, left fourth and fifth fingers,  He also noticed the weakness, he used to workout heavy lifting, now is really limited, it is even difficult for him to open jars,  He denies bowel and bladder incontinence, he noticed gait abnormality, but contributed to his severe left hip pain, MRI of left hip in January 23 showed severe osteoarthritis of left hip, with moderate effusion, degeneration of lateral labrum with laceration of the anterior labrum  Update June 18, 2022: He is accompanied by his wife at today's visit, reported bilateral arm weakness, wasting of the muscles, gait abnormality, moderate left hip pain,  We personally reviewed MRI cervical spine on June 13, 2022, severe spinal stenosis at C3-4, moderate C4-5, with evidence of myelomalacia adjacent to C3-4, variable degree of foraminal narrowing at multiple levels  He does need cervical decompression to prevent further worsening of his cervical myelopathy symptoms  UPDATE August 21 2022: He underwent anterior C3-4, C4-5 C5-6 cervical decompression by Dr. Claudette Head on July 23, 2022, he still wear his hard cervical collar, no longer have significant neck pain  But he is very concerned about persistent bilateral upper extremity paresthesia, weakness, also gait abnormality, but is making progress, now progress from walker to cane, sometimes walk independently at home  He also complains of urinary urgency, constipation, quite frustrated that he is not making the progress like what he is expected  Again personally reviewed presurgical MRI cervical spine, severe spinal stenosis at C3-4, C 5 6 level, C4-5 level, there is also evidence of cord signal abnormality  UPDATE Oct 10 2022: He is alone at today's clinical visit, continues to feel frustrated about  his gait abnormality, spasm of bilateral hamstring, bilateral  upper extremities weakness, continues with operative extremity neuropathic pain, numbness, taking gabapentin as needed, worry about the side effect of Cymbalta, did not try,  Is trying physical therapy twice a week, try to walk daily,  He also complains of chronic constipation, managed by large glass of juice, fiber, occasionally laxative, frequent awakening at nighttime for urinary frequency, bladder spasms  Personally reviewed postsurgical repeat MRI cervical spine September 22, 2022 -Anterior cervical discectomy and fusion with metal hardware spanning C3, C4, C5 and C6 levels. -Moderate spinal stenosis at C3-4, C4-5 and C5-6 levels.  Mild myelomalacia noted at C4 level.  20 -Multilevel foraminal stenosis spanning C2-3 down to C7-T1 levels as above. -Compared to MRI from 06/13/2022 there have been interval surgical changes as above. Spinal stenosis has slightly improved from prior study.   MRI thoracic spine without contrast demonstrating: - At T10-11 broad disc bulging, facet and ligamentum flavum hypertrophy with mild spinal stenosis and mild bilateral foraminal stenosis; subtle increased T2 signal within the spinal cord at T10 level (series 12 image 28, 29) may reflect mild spinal cord edema, syringomyelia or artifact.    MRI lumbar spine (without) demonstrating: - At L3-4: Disc bulging and facet vertebrae with moderate spinal stenosis and mild bilateral foraminal stenosis. - At L4-5: Disc bulging and facet hypertrophy with moderate spinal stenosis and mild bilateral foraminal stenosis. - At L5-S1: Disc bulging and facet hypertrophy with moderate right and mild left foraminal stenosis. - Lumbarization of S1 segment with transitional features.  Lowest visualized intervertebral disc level is labeled S1-2 and the study.  Note enumeration scheme if intervention is planned.  Reviewed neurosurgical evaluation by Dr. Claudette Head September 06, 2022, severe cervical stenosis myelomalacia status post  C3-6 ACDF, with continued myelopathic neuropathic pain, per patient, he was given the option of posterior approach cervical decompression surgery again, but he is very hesitant to go through second neck surgery, very frustrated about his symptoms  PHYSICAL EXAM:  Today's Vitals   10/10/22 1558  BP: 124/76  Pulse: 65  Weight: 243 lb (110.2 kg)  Height: '6\' 3"'$  (1.905 m)   Body mass index is 30.37 kg/m.   PHYSICAL EXAMNIATION:  Gen: NAD, conversant, well nourised, well groomed                     Cardiovascular: Regular rate rhythm, no peripheral edema, warm, nontender. Eyes: Conjunctivae clear without exudates or hemorrhage Neck: Supple, no carotid bruits. Pulmonary: Clear to auscultation bilaterally   NEUROLOGICAL EXAM:  MENTAL STATUS: Speech/cognition: Awake, alert, oriented to history taking and casual conversation CRANIAL NERVES: CN II: Visual fields are full to confrontation. Pupils are round equal and briskly reactive to light. CN III, IV, VI: extraocular movement are normal. No ptosis. CN V: Facial sensation is intact to light touch CN VII: Face is symmetric with normal eye closure  CN VIII: Hearing is normal to causal conversation. CN IX, X: Phonation is normal. CN XI: Shoulder shrug is symmetric, in rigid cervical collar   MOTOR:  UE Shoulder Abduction Shoulder External Rotation Elbow Flexion Elbow  Extension Wrist Flexion Wrist Extension Grip  R 5- 5- 5- '5 5 5 5  '$ L '4  4 4 5 5 5 5   '$ LE Hip Flexion Knee flexion Knee extension Ankle Dorsiflexion Eversion Ankle plantar Flexion Inversion  R 5- 5 5- '5 5 5 5  '$ L 4 5 5- '5 5 5 5   '$ -  REFLEXES: Reflexes (R/L) biceps 2/2, triceps 2/2, knees 3/3 , and ankles 2/2 . Plantar responses are extensor bilaterally  SENSORY: Intact to light touch, -  COORDINATION: There is no trunk or limb dysmetria noted.  GAIT/STANCE:  push-up to get up from seated position, cautious, stiff, wide-based rely on his cane,  REVIEW OF  SYSTEMS:  Full 14 system review of systems performed and notable only for as above All other review of systems were negative.   ALLERGIES: Allergies  Allergen Reactions   Tadalafil Other (See Comments)    Headache     HOME MEDICATIONS: Current Outpatient Medications  Medication Sig Dispense Refill   azaTHIOprine (IMURAN) 50 MG tablet Take 75 mg by mouth daily.     gabapentin (NEURONTIN) 300 MG capsule Take 1 capsule (300 mg total) by mouth 3 (three) times daily. (Patient taking differently: Take 300 mg by mouth daily as needed (pain).) 90 capsule 11   levothyroxine (SYNTHROID) 200 MCG tablet Take 200 mcg by mouth daily before breakfast.     sildenafil (VIAGRA) 100 MG tablet Take 100 mg by mouth as needed for erectile dysfunction.     tadalafil (CIALIS) 5 MG tablet Take 5 mg by mouth daily as needed (BPH).     DULoxetine (CYMBALTA) 60 MG capsule Take 60 mg by mouth daily. (Patient not taking: Reported on 10/10/2022)     Current Facility-Administered Medications  Medication Dose Route Frequency Provider Last Rate Last Admin   acetaminophen (TYLENOL) tablet 975 mg  975 mg Oral Q4H PRN Copland, Gay Filler, MD        PAST MEDICAL HISTORY: Past Medical History:  Diagnosis Date   Arthritis    BPH (benign prostatic hyperplasia)    Colon polyp    ED (erectile dysfunction)    Elevated liver enzymes    Gallstones    Hepatitis    History of anemia    Hypercholesterolemia    Hypothyroidism    Mild tricuspid regurgitation    Mitral valve regurgitation    Thyroid disease    Vertigo     PAST SURGICAL HISTORY: Past Surgical History:  Procedure Laterality Date   ANTERIOR CERVICAL DECOMP/DISCECTOMY FUSION N/A 07/23/2022   Procedure: Anterior Cervical Discectomy and Fusion Cervical Three-Four/Four-Five/Five-Six;  Surgeon: Vallarie Mare, MD;  Location: [facility];  Service: Neurosurgery;  Laterality: N/A;  3C   FLEXIBLE SIGMOIDOSCOPY      FAMILY HISTORY: Family History  Problem  Relation Age of Onset   Cancer Mother        pancreatic   Cancer Father        stomach   Kidney failure Brother    Other Brother        neck tumor   Hypertension Brother    Diabetes Brother    Lupus Daughter    Hypertension Sister    Other Son        boating accident    SOCIAL HISTORY: Social History   Socioeconomic History   Marital status: Married    Spouse name: Not on file   Number of children: Not on file   Years of education: Not on file   Highest education level: Not on file  Occupational History   Occupation: Sales  Tobacco Use   Smoking status: Former   Smokeless tobacco: Never  Substance and Sexual Activity   Alcohol use: Yes    Comment: occ   Drug use: No   Sexual activity: Not on file  Other Topics Concern   Not  on file  Social History Narrative   Not on file   Social Determinants of Health   Financial Resource Strain: Not on file  Food Insecurity: Not on file  Transportation Needs: Not on file  Physical Activity: Not on file  Stress: Not on file  Social Connections: Not on file  Intimate Partner Violence: Not on file      Alex West, M.D. Ph.D.  [facility] [address] [place], [place] [address] Ph: [phone] Fax: [phone]  CC:  Lawerance Cruel, MD [place],  [place] [id number]  Lawerance Cruel, MD     Total time spent reviewing the chart, obtaining history, examined patient, ordering tests, documentation, consultations and family, care coordination was  56 minutes

## 2022-10-10 NOTE — Therapy (Signed)
OUTPATIENT PHYSICAL THERAPY TREATMENT   Patient Name: Alex West MRN: [id number] DOB:18-Jun-1956, 66 y.o., male Today's Date: 10/10/2022      PT End of Session - 10/10/22 1150     Visit Number 12    Date for PT Re-Evaluation 11/07/22    Authorization Type [employer] - VL: 2 (7 used earlier this year)    Progress Note Due on Visit 18   Recert on visit #8 - 99/2/42   PT Start Time 1103    PT Stop Time 1146    PT Time Calculation (min) 43 min    Activity Tolerance Patient tolerated treatment well    Behavior During Therapy WFL for tasks assessed/performed                       Past Medical History:  Diagnosis Date   Arthritis    BPH (benign prostatic hyperplasia)    Colon polyp    ED (erectile dysfunction)    Elevated liver enzymes    Gallstones    Hepatitis    History of anemia    Hypercholesterolemia    Hypothyroidism    Mild tricuspid regurgitation    Mitral valve regurgitation    Thyroid disease    Vertigo    Past Surgical History:  Procedure Laterality Date   ANTERIOR CERVICAL DECOMP/DISCECTOMY FUSION N/A 07/23/2022   Procedure: Anterior Cervical Discectomy and Fusion Cervical Three-Four/Four-Five/Five-Six;  Surgeon: Vallarie Mare, MD;  Location: [place];  Service: Neurosurgery;  Laterality: N/A;  3C   FLEXIBLE SIGMOIDOSCOPY     Patient Active Problem List   Diagnosis Date Noted   Paresthesia 08/21/2022   Stenosis of cervical spine with myelopathy ([place]) 07/23/2022   Gait abnormality 06/18/2022   Cervical myelopathy ([place]) 06/17/2022   Weakness 06/06/2022   Neck pain 06/06/2022   OA (osteoarthritis) of hip 07/24/2021   Immunosuppressed status ([place]) 09/05/2014   Hepatitis, autoimmune ([place]) 09/05/2014    PCP: Lawerance Cruel, MD  REFERRING PROVIDER: Vallarie Mare, MD  REFERRING DIAG: [phone] (ICD-10-CM) - Cervical spondylosis with myelopathy  THERAPY DIAG:  Other abnormalities of gait and mobility  Muscle weakness  (generalized)  Difficulty in walking, not elsewhere classified  Unsteadiness on feet  Pain in left hip  RATIONALE FOR EVALUATION AND TREATMENT: Rehabilitation  ONSET DATE: 07/23/2022 - ACDF C3-4, C4-5, C5-6   NEXT MD VISIT: 09/06/22 - surgery f/u  SUBJECTIVE:   SUBJECTIVE STATEMENT: Everything is feeling heavy today.  PAIN:  Are you having pain? No - L hip  Are you having pain? No - B shoulder and arms - just strain in UT area  PERTINENT HISTORY: ACDF C3-4, C4-5, C5-6 on 07/23/22; L hip pain - severe OA; arthiritis; BPH; anemia; hypothyroidism; MVR & TVR; thyroid disease; vertigo  PRECAUTIONS: None   WEIGHT BEARING RESTRICTIONS: No  FALLS:  Has patient fallen in last 6 months? No  LIVING ENVIRONMENT: Lives with: lives with their spouse Lives in: House/apartment Stairs: Yes: Internal: 14 steps; on right going up and External: 3 steps; on left going up Has following equipment at home: Single point cane and Walker - 2 wheeled  OCCUPATION: Retired  PLOF: Independent and Leisure: play golf, gym 5x/wk   PATIENT GOALS: "I want to get back to walking normally again."   OBJECTIVE:   DIAGNOSTIC FINDINGS:  09/18/22 - Cervical MRI: IMPRESSION: MRI cervical spine (without) demonstrating: - Anterior cervical discectomy and fusion with metal hardware spanning C3, C4, C5 and C6 levels. -  Moderate spinal stenosis at C3-4, C4-5 and C5-6 levels.  Mild myelomalacia noted at C4 level.  20 - Multilevel foraminal stenosis spanning C2-3 down to C7-T1 levels as above. - Compared to MRI from 06/13/2022 there have been interval surgical changes as above. Spinal stenosis has slightly improved from prior study. 09/18/22 - Thoracic MRI: IMPRESSION: MRI thoracic spine without contrast demonstrating: - At T10-11 broad disc bulging, facet and ligamentum flavum hypertrophy with mild spinal stenosis and mild bilateral foraminal stenosis; subtle increased T2 signal within the spinal cord at T10 level  (series 12 image 28, 29) may reflect mild spinal cord edema, syringomyelia or artifact. 09/18/22 - Lumbar MRI: IMPRESSION: MRI lumbar spine (without) demonstrating: - At L3-4: Disc bulging and facet vertebrae with moderate spinal stenosis and mild bilateral foraminal stenosis. - At L4-5: Disc bulging and facet hypertrophy with moderate spinal stenosis and mild bilateral foraminal stenosis. - At L5-S1: Disc bulging and facet hypertrophy with moderate right and mild left foraminal stenosis. - Lumbarization of S1 segment with transitional features.  Lowest visualized intervertebral disc level is labeled S1-2 and the study.  Note enumeration scheme if intervention is planned.  06/13/22 - Cervical MRI:  IMPRESSION: This MRI of the cervical spine without contrast shows the following: Increase signal within the spinal cord adjacent to C3-C4 consistent with compressive myelopathy. At C3-C4 and C5-C6 there is severe spinal stenosis, at C4-C5 there is moderate spinal stenosis and at C2-C3, C6-C7 and C7-T1 there is mild spinal stenosis. There are various degrees of foraminal narrowing at every level in the cervical spine.  This is severe at C3-C4 where there is probable bilateral C4 nerve root compression, and moderately severe at C4-C5 and C5-C6 where there is potential for bilateral C5 and C6 nerve root compression.  PATIENT SURVEYS:  NDI  23/50 = 46.0%, moderate disability LEFS 10/80 = 12.5%, 87.5% disability  COGNITION: Overall cognitive status: Within functional limits for tasks assessed     SENSATION: Light touch: Impaired  Intermittent numbness in R thumb and B hands, burning and tingling in L shoulder and UE  MUSCLE LENGTH: Hamstrings: mild/mod tight L>R ITB: mod tight B Piriformis: mod/severe tight L, mod tight R Hip flexors: mod tight L>R Quads: mod tight L>R  POSTURE:  No Significant postural limitations and cervical collar in place  CERVICAL ROM:   Active ROM AROM (deg) 09/11/22   Flexion 32  Extension 44  Right lateral flexion 23  Left lateral flexion 13  Right rotation 52  Left rotation 51   (Blank rows = not tested)   UPPER EXTREMITY ROM: Deferred on eval d/t pt reported post-op precautions  Active ROM Right 09/11/22 Left 09/11/22 Right 09/26/22 Left 09/26/22  Shoulder flexion 131 139 124 124  Shoulder extension 36 39 38 32  Shoulder abduction 157 151 104 105  Shoulder adduction      Shoulder internal rotation FIR to L2 FIR to L1    Shoulder external rotation FER - unable to reach behind FER - unable to reach behind    Elbow flexion      Elbow extension      Wrist flexion      Wrist extension      Wrist ulnar deviation      Wrist radial deviation      Wrist pronation      Wrist supination      (Blank rows = not tested)  UPPER EXTREMITY MMT:  Deferred on eval d/t pt reported post-op precautions  MMT Right 09/11/22  Left 09/11/22 Right 09/26/22 Left 09/26/22  Shoulder flexion 4+ 4+ 4+ 4+  Shoulder extension 4 4- 4+ 4+  Shoulder abduction 4+ 4+ 4+ 4+  Shoulder adduction      Shoulder internal rotation 4+ 4+ 4+ 4+  Shoulder external rotation $RemoveBeforeDEI'4 4 4 4  'vStpqDDXyfZpMcYx$ Middle trapezius      Lower trapezius      Elbow flexion 5 4+ 5 5  Elbow extension 4+ 4+ 4+ 4+  Wrist flexion      Wrist extension      Wrist ulnar deviation      Wrist radial deviation      Wrist pronation      Wrist supination      Grip strength (lbs) 75 68    (Blank rows = not tested)  LOWER EXTREMITY ROM: B hip ROM mild to moderately limited in all planes with greatest limitation in L hip flexion and IR/ER  LOWER EXTREMITY MMT: Tested in sitting on eval; *standard testing positions except extension tested in sidelying on 09/03/22  MMT Right eval Left eval Right 09/03/22 Left 09/03/22 Right 09/26/22 Left 09/26/22  Hip flexion 4+ 4 5 4+ 4+ 4  Hip extension 4 4+ 4+ * 4 * 4- 4-  Hip abduction 4+ 4 4+ 4 4 seated 4- seated  Hip adduction 4+ 4+ 5 4+ 4+ seated 4- seated  Hip internal  rotation 4 4 4+ 4+ 4 4  Hip external rotation 4 4 4+ 4+ 4 4  Knee flexion $RemoveBefo'4 4 5 'jGlXOeRmrMh$ 4+ 4+ 4+  Knee extension 4+ 4+ $Rem'5 5 5 'dXQk$ 4+  Ankle dorsiflexion 4 4+ 4+ 4+ 5 5  Ankle plantarflexion   5 5- 5- 20 SLS HR (Limited lift) 4 13 SLS HR  Ankle inversion        Ankle eversion         (Blank rows = not tested)  BED MOBILITY:  NT  TRANSFERS: Assistive device utilized: None  Sit to stand: Modified independence Stand to sit: Modified independence Chair to chair:  NT Floor:  NT  GAIT: Distance walked: 60 ft Assistive device utilized: Single point cane Level of assistance: Modified independence Gait pattern: step through pattern, decreased stride length, decreased hip/knee flexion- Right, decreased hip/knee flexion- Left, lateral hip instability, and decreased trunk rotation Comments: 1.89 ft/sec gait speed = limited community ambulator  RAMP: Level of Assistance:  NT Assistive device utilized:  NT Ramp Comments:   CURB:  Level of Assistance:  NT Assistive device utilized:  NT Curb Comments:   STAIRS:  Level of Assistance: SBA  Stair Negotiation Technique: Step to Pattern with Single Rail on Right  Number of Stairs: 14   Height of Stairs: 7  Comments: leads with R LE  FUNCTIONAL TESTS:  5 times sit to stand: 16.63 sec; >15 sec = recurrent fall risk Timed up and go (TUG): 23.93 sec w/o AD, 17.03 sec with SPC; >13.5 sec = high fall risk 10 meter walk test: 17.38 sec with SPC - gait speed = 1.89 ft/sec; ,1.8 ft/sec = risk for recurrent falls Dynamic Gait Index: 12/24 - Scores of 19 or less are predictive of falls in older community living adults   TODAY'S TREATMENT: 10/10/22 THERAPEUTIC EXERCISE: to improve flexibility, strength and mobility.  Verbal and tactile cues throughout for technique. UBE L3 3 min fwd/ 3 min back Treadmill 1.73mph 5 min Rec Bike L3x54min Star excursion 1/2 circle pattern 3x bil Standing lat pull + march GTB x 10  Pallof press  with GTB x 10  bil  10/07/22   Pt seen for aquatic therapy today.  Treatment took place in water 3.25-4.5 ft in depth at the [facility]. Temp of water was 91.  Pt entered/exited the pool via stairs step to pattern with hand rail.                           *Gait training/Walking forward, back and side stepping with cues for heel strike, toe off and step length/marching  *Side lunges with yellow hand buoys x 4 widths, Ue add/abd *UE support on yellow hand buoys hip circles; df; df; add/abd    - add/abd; hip ext squats x 10 *bouncing BB off wall x 20 rep *4 ft heel walking then toe walking 2 widths ea *Cycling on noodle with hand buoys for " handlebars".     Pt requires the buoyancy and hydrostatic pressure of water for support, and to offload joints by unweighting joint load by at least 50 % in navel deep water and by at least 75-80% in chest to neck deep water.  Viscosity of the water is needed for resistance of strengthening. Water current perturbations provides challenge to standing balance requiring increased core activation.    10/04/22 Therapeutic Exercise: UBE L1 3 min fwd/ 3 min back Standing open book x 10 bil at wall Standing lumbar ext wall x 10 Wall slides AAROM prayer hands x 10 with slight hold at top CW/CCW ball on wall x 10 each UE Seated green pball slides into shoulder flexion x 10 bil Seated horiz ABD and ER green TB x 10 4 way reach with opp LE SLS 5x  09/26/22 THERAPEUTIC EXERCISE: to improve flexibility, strength and mobility.  Verbal and tactile cues throughout for technique. Rec Bike L3 x 7 min   THERAPEUTIC ACTIVITIES: ROM MMT 5 times sit to stand: 12.84 sec Timed up and go (TUG): 11.56 sec w/o AD, 10.44 sec with SPC 10 meter walk test: 11.74 sec w/o AD, 11.81 with SPC - gait speed = 2.79 ft/sec w/o AD, 2.78 ft/sec with SPC - gait w/o AD with wider BOS and mild ataxia Dynamic Gait Index: 13/24 - Scores of 19 or less are predictive of falls in older  community living adults   09/23/22 THERAPEUTIC EXERCISE: to improve flexibility, strength and mobility.  Verbal and tactile cues throughout for technique. Rec Bike L3 x 7 min  Bridge + GTB clam 10 x 3", 2 sets Sustained bridge + GTB march x 10 R/L sidelying GTB clam 10 x 3" Hooklying dead bug 10 x 3" TrA + sequential 90/90 march 10 x 3" R/L single leg bridge 10 x 3" Hooklying TrA + GTB B shoulder extension 10 x 3"  Hooklying TrA + GTB B scap retraction + shoulder horiz ABD 10 x 3" Hooklying TrA + GTB B scap retraction + shoulder horiz ABD diagonals 10 x 3"  Quadruped alt UE & LE raises x 10 each L mod thomas hip flexor stretch 3 x 30-60"     PATIENT EDUCATION:  Education details:  resume hip stretches from prior PT episode Person educated: Patient Education method: Explanation Education comprehension: verbalized understanding  HOME EXERCISE PROGRAM: Access Code: [email or web address] URL: [email or web address] Date: 09/11/2022 Prepared by: Annie Paras  Exercises - Seated Table Hamstring Stretch  - 2 x daily - 7 x weekly - 2 sets - 30 sec hold - Supine Figure 4 Piriformis Stretch  - 2 x daily -  7 x weekly - 2 sets - 30 sec hold - Supine Piriformis Stretch with Foot on Ground  - 2 x daily - 7 x weekly - 2 sets - 30 sec hold - Sit to Stand  - 1 x daily - 3 x weekly - 3 sets - 10 reps - Standing Hip Abduction with Resistance at Ankles and Counter Support  - 1 x daily - 3 x weekly - 2 sets - 10 reps - 3 sec hold - Standing Hip Extension with Resistance at Ankles and Counter Support  - 1 x daily - 3 x weekly - 2 sets - 10 reps - 3 sec hold - Marching with Resistance  - 1 x daily - 3 x weekly - 2 sets - 10 reps - 3 sec hold - Standing Hamstring Curl with Resistance  - 1 x daily - 3 x weekly - 2 sets - 10 reps - 3 sec hold - Standing Shoulder Row with Anchored Resistance  - 1 x daily - 3-4 x weekly - 2 sets - 10 reps - 5 sec hold - Scapular Retraction with Resistance Advanced  - 1  x daily - 3-4 x weekly - 2 sets - 10 reps - 5 sec hold - Shoulder External Rotation and Scapular Retraction with Resistance  - 1 x daily - 3 x weekly - 2 sets - 10 reps - 3-5 sec hold   ASSESSMENT:  CLINICAL IMPRESSION: Pt continues to note benefit from aquatic and land PT. Focused a more on cardiovascular endurance with beginning of session. Also worked on core stabilization exercises today, with patient showing more difficulty with TB exercises. He showed mild unsteadiness with star excursion. He felt that his legs were heavy today so deferred assessing gait w/o AD.   OBJECTIVE IMPAIRMENTS: Abnormal gait, decreased activity tolerance, decreased balance, decreased endurance, decreased mobility, difficulty walking, decreased strength, decreased safety awareness, increased fascial restrictions, impaired perceived functional ability, increased muscle spasms, impaired flexibility, impaired sensation, improper body mechanics, postural dysfunction, and pain.   ACTIVITY LIMITATIONS: carrying, lifting, bending, sitting, standing, squatting, sleeping, stairs, transfers, bed mobility, bathing, toileting, dressing, reach over head, hygiene/grooming, locomotion level, and caring for others  PARTICIPATION LIMITATIONS: meal prep, cleaning, laundry, driving, community activity, and yard work  PERSONAL FACTORS: Fitness, Past/current experiences, Time since onset of injury/illness/exacerbation, Transportation, and 3+ comorbidities: ACDF C3-4, C4-5, C5-6 on 07/23/22; L hip pain - severe OA; arthiritis; BPH; anemia; hypothyroidism; MVR & TVR; thyroid disease; vertigo  are also affecting patient's functional outcome.   REHAB POTENTIAL: Good  CLINICAL DECISION MAKING: Evolving/moderate complexity  EVALUATION COMPLEXITY: Moderate   GOALS: Goals reviewed with patient? Yes  SHORT TERM GOALS: Target date: 09/09/2022   Patient will be independent with initial HEP. Baseline:  Goal status: MET  09/03/22  2.   Patient will improve 5x STS time to </= 15 seconds for improved efficiency and safety with transfers Baseline: 16.63 sec Goal status: MET  09/04/11 - 5xSTS = 14.50 sec; 09/26/22 - 12.84 sec  LONG TERM GOALS: Target date: 09/30/2022; extended to 11/07/22  Patient will be independent with advanced/ongoing HEP to improve outcomes and carryover.  Baseline:  Goal status: IN PROGRESS  09/26/22 - met for current HEP  2.  Patient will demonstrate improved B LE strength to >/= 4+/5 for improved stability and ease of mobility. Baseline:  Goal status: IN PROGRESS  09/26/22 - Refer to above MMT table - strength fluctuating from last testing   3.  Patient will be able  to ambulate 600' with LRAD and normal gait pattern across various surfaces to access community.  Baseline:  Goal status: IN PROGRESS - 10/10/22 pt ambulates w/o AD at home, does not walk in community w/o AD  4.  Patient will improve gait velocity to at least 2.62 ft/sec for improved gait efficiency and safety as a community ambulator. Baseline:  Goal status: MET  09/03/22 - Gait speed = 3.04 ft/sec with and w/o SPC; 09/26/22 - 2.79 ft/sec w/o AD, 2.78 ft/sec with SPC - gait w/o AD with wider BOS and mild ataxia  5. Patient will be able to ascend/descend stairs with 1 HR and reciprocal step pattern safely to access home and community.  Baseline:  Goal status: MET  09/26/22  6.  Patient will report 20/80 on LEFS to demonstrate improved functional ability. Baseline: LEFS 10/80 = 12.5% (87.5% disability) Goal status: IN PROGRESS  7.  Patient will demonstrate decreased TUG time to </= 13.5 sec to decrease risk for falls with transitional mobility Baseline: 23.93 sec w/o AD, 17.03 sec with SPC Goal status: MET  09/03/22 - TUG = 12.94 sec w/o AD; 10.85 sec with SPC; 09/26/22 - 11.56 sec w/o AD, 10.44 sec with SPC  9.  Patient will demonstrate at least 19/24 on DGI to decrease risk of falls. Baseline: DGI = 12/24 Goal status: IN PROGRESS 09/26/22 -  13/24    PLAN: PT FREQUENCY: 2x/week  PT DURATION: 4-6 weeks  PLANNED INTERVENTIONS: Therapeutic exercises, Therapeutic activity, Neuromuscular re-education, Balance training, Gait training, Patient/Family education, Self Care, Joint mobilization, Stair training, DME instructions, Dry Needling, Electrical stimulation, Cryotherapy, Moist heat, Taping, Ultrasound, Ionotophoresis 4mg /ml Dexamethasone, Manual therapy, and Re-evaluation  PLAN FOR NEXT SESSION: hip flexibility; LE strengthening; balance/gait stability; postural and UE strengthening; hybrid therapy with 1 visit each land- and aquatic-based per week.   Artist Pais, PTA 10/10/2022, 11:51 AM

## 2022-10-13 NOTE — Therapy (Signed)
OUTPATIENT PHYSICAL THERAPY TREATMENT   Patient Name: Alex West MRN: [id number] DOB:1956/06/16, 66 y.o., male Today's Date: 10/14/2022      PT End of Session - 10/14/22 0856     Visit Number 13    Date for PT Re-Evaluation 11/07/22    Authorization Type BCBS - VL: 3 (7 used earlier this year)    Progress Note Due on Visit 93   Recert on visit #8 - 78/9/38   PT Start Time 0900    PT Stop Time 0945    PT Time Calculation (min) 45 min    Activity Tolerance Patient tolerated treatment well    Behavior During Therapy WFL for tasks assessed/performed                        Past Medical History:  Diagnosis Date   Arthritis    BPH (benign prostatic hyperplasia)    Colon polyp    ED (erectile dysfunction)    Elevated liver enzymes    Gallstones    Hepatitis    History of anemia    Hypercholesterolemia    Hypothyroidism    Mild tricuspid regurgitation    Mitral valve regurgitation    Thyroid disease    Vertigo    Past Surgical History:  Procedure Laterality Date   ANTERIOR CERVICAL DECOMP/DISCECTOMY FUSION N/A 07/23/2022   Procedure: Anterior Cervical Discectomy and Fusion Cervical Three-Four/Four-Five/Five-Six;  Surgeon: Vallarie Mare, MD;  Location: [place];  Service: Neurosurgery;  Laterality: N/A;  3C   FLEXIBLE SIGMOIDOSCOPY     Patient Active Problem List   Diagnosis Date Noted   Paresthesia 08/21/2022   Stenosis of cervical spine with myelopathy ([place]) 07/23/2022   Gait abnormality 06/18/2022   Cervical myelopathy ([place]) 06/17/2022   Weakness 06/06/2022   Neck pain 06/06/2022   OA (osteoarthritis) of hip 07/24/2021   Immunosuppressed status ([place]) 09/05/2014   Hepatitis, autoimmune ([place]) 09/05/2014    PCP: Lawerance Cruel, MD  REFERRING PROVIDER: Vallarie Mare, MD  REFERRING DIAG: [phone] (ICD-10-CM) - Cervical spondylosis with myelopathy  THERAPY DIAG:  Other abnormalities of gait and mobility  Muscle weakness  (generalized)  Difficulty in walking, not elsewhere classified  Unsteadiness on feet  RATIONALE FOR EVALUATION AND TREATMENT: Rehabilitation  ONSET DATE: 07/23/2022 - ACDF C3-4, C4-5, C5-6   NEXT MD VISIT: 09/06/22 - surgery f/u  SUBJECTIVE:   SUBJECTIVE STATEMENT: R arm is better, able to lie with it under my head last night. Saw MD she is [facility]  PAIN:  Are you having pain? No - L hip  Are you having pain? No - B shoulder and arms - just strain in UT area  PERTINENT HISTORY: ACDF C3-4, C4-5, C5-6 on 07/23/22; L hip pain - severe OA; arthiritis; BPH; anemia; hypothyroidism; MVR & TVR; thyroid disease; vertigo  PRECAUTIONS: None   WEIGHT BEARING RESTRICTIONS: No  FALLS:  Has patient fallen in last 6 months? No  LIVING ENVIRONMENT: Lives with: lives with their spouse Lives in: House/apartment Stairs: Yes: Internal: 14 steps; on right going up and External: 3 steps; on left going up Has following equipment at home: Single point cane and Walker - 2 wheeled  OCCUPATION: Retired  PLOF: Independent and Leisure: play golf, gym 5x/wk   PATIENT GOALS: "I want to get back to walking normally again."   OBJECTIVE:   DIAGNOSTIC FINDINGS:  09/18/22 - Cervical MRI: IMPRESSION: MRI cervical spine (without) demonstrating: -  Anterior cervical discectomy and fusion with metal hardware spanning C3, C4, C5 and C6 levels. - Moderate spinal stenosis at C3-4, C4-5 and C5-6 levels.  Mild myelomalacia noted at C4 level.  20 - Multilevel foraminal stenosis spanning C2-3 down to C7-T1 levels as above. - Compared to MRI from 06/13/2022 there have been interval surgical changes as above. Spinal stenosis has slightly improved from prior study. 09/18/22 - Thoracic MRI: IMPRESSION: MRI thoracic spine without contrast demonstrating: - At T10-11 broad disc bulging, facet and ligamentum flavum hypertrophy with mild spinal stenosis and mild bilateral foraminal stenosis; subtle  increased T2 signal within the spinal cord at T10 level (series 12 image 28, 29) may reflect mild spinal cord edema, syringomyelia or artifact. 09/18/22 - Lumbar MRI: IMPRESSION: MRI lumbar spine (without) demonstrating: - At L3-4: Disc bulging and facet vertebrae with moderate spinal stenosis and mild bilateral foraminal stenosis. - At L4-5: Disc bulging and facet hypertrophy with moderate spinal stenosis and mild bilateral foraminal stenosis. - At L5-S1: Disc bulging and facet hypertrophy with moderate right and mild left foraminal stenosis. - Lumbarization of S1 segment with transitional features.  Lowest visualized intervertebral disc level is labeled S1-2 and the study.  Note enumeration scheme if intervention is planned.  06/13/22 - Cervical MRI:  IMPRESSION: This MRI of the cervical spine without contrast shows the following: Increase signal within the spinal cord adjacent to C3-C4 consistent with compressive myelopathy. At C3-C4 and C5-C6 there is severe spinal stenosis, at C4-C5 there is moderate spinal stenosis and at C2-C3, C6-C7 and C7-T1 there is mild spinal stenosis. There are various degrees of foraminal narrowing at every level in the cervical spine.  This is severe at C3-C4 where there is probable bilateral C4 nerve root compression, and moderately severe at C4-C5 and C5-C6 where there is potential for bilateral C5 and C6 nerve root compression.  PATIENT SURVEYS:  NDI  23/50 = 46.0%, moderate disability LEFS 10/80 = 12.5%, 87.5% disability  COGNITION: Overall cognitive status: Within functional limits for tasks assessed     SENSATION: Light touch: Impaired  Intermittent numbness in R thumb and B hands, burning and tingling in L shoulder and UE  MUSCLE LENGTH: Hamstrings: mild/mod tight L>R ITB: mod tight B Piriformis: mod/severe tight L, mod tight R Hip flexors: mod tight L>R Quads: mod tight L>R  POSTURE:  No Significant postural limitations and cervical collar in  place  CERVICAL ROM:   Active ROM AROM (deg) 09/11/22  Flexion 32  Extension 44  Right lateral flexion 23  Left lateral flexion 13  Right rotation 52  Left rotation 51   (Blank rows = not tested)   UPPER EXTREMITY ROM: Deferred on eval d/t pt reported post-op precautions  Active ROM Right 09/11/22 Left 09/11/22 Right 09/26/22 Left 09/26/22  Shoulder flexion 131 139 124 124  Shoulder extension 36 39 38 32  Shoulder abduction 157 151 104 105  Shoulder adduction      Shoulder internal rotation FIR to L2 FIR to L1    Shoulder external rotation FER - unable to reach behind FER - unable to reach behind    Elbow flexion      Elbow extension      Wrist flexion      Wrist extension      Wrist ulnar deviation      Wrist radial deviation      Wrist pronation      Wrist supination      (Blank rows = not tested)  UPPER EXTREMITY MMT:  Deferred on eval d/t pt reported post-op precautions  MMT Right 09/11/22 Left 09/11/22 Right 09/26/22 Left 09/26/22  Shoulder flexion 4+ 4+ 4+ 4+  Shoulder extension 4 4- 4+ 4+  Shoulder abduction 4+ 4+ 4+ 4+  Shoulder adduction      Shoulder internal rotation 4+ 4+ 4+ 4+  Shoulder external rotation $RemoveBeforeDEI'4 4 4 4  'WNscIdWrjYjzxchm$ Middle trapezius      Lower trapezius      Elbow flexion 5 4+ 5 5  Elbow extension 4+ 4+ 4+ 4+  Wrist flexion      Wrist extension      Wrist ulnar deviation      Wrist radial deviation      Wrist pronation      Wrist supination      Grip strength (lbs) 75 68    (Blank rows = not tested)  LOWER EXTREMITY ROM: B hip ROM mild to moderately limited in all planes with greatest limitation in L hip flexion and IR/ER  LOWER EXTREMITY MMT: Tested in sitting on eval; *standard testing positions except extension tested in sidelying on 09/03/22  MMT Right eval Left eval Right 09/03/22 Left 09/03/22 Right 09/26/22 Left 09/26/22  Hip flexion 4+ 4 5 4+ 4+ 4  Hip extension 4 4+ 4+ * 4 * 4- 4-  Hip abduction 4+ 4 4+ 4 4 seated 4- seated  Hip  adduction 4+ 4+ 5 4+ 4+ seated 4- seated  Hip internal rotation 4 4 4+ 4+ 4 4  Hip external rotation 4 4 4+ 4+ 4 4  Knee flexion $RemoveBefo'4 4 5 'zkdzIeWRFqb$ 4+ 4+ 4+  Knee extension 4+ 4+ $Rem'5 5 5 'zQvJ$ 4+  Ankle dorsiflexion 4 4+ 4+ 4+ 5 5  Ankle plantarflexion   5 5- 5- 20 SLS HR (Limited lift) 4 13 SLS HR  Ankle inversion        Ankle eversion         (Blank rows = not tested)  BED MOBILITY:  NT  TRANSFERS: Assistive device utilized: None  Sit to stand: Modified independence Stand to sit: Modified independence Chair to chair:  NT Floor:  NT  GAIT: Distance walked: 60 ft Assistive device utilized: Single point cane Level of assistance: Modified independence Gait pattern: step through pattern, decreased stride length, decreased hip/knee flexion- Right, decreased hip/knee flexion- Left, lateral hip instability, and decreased trunk rotation Comments: 1.89 ft/sec gait speed = limited community ambulator  RAMP: Level of Assistance:  NT Assistive device utilized:  NT Ramp Comments:   CURB:  Level of Assistance:  NT Assistive device utilized:  NT Curb Comments:   STAIRS:  Level of Assistance: SBA  Stair Negotiation Technique: Step to Pattern with Single Rail on Right  Number of Stairs: 14   Height of Stairs: 7  Comments: leads with R LE  FUNCTIONAL TESTS:  5 times sit to stand: 16.63 sec; >15 sec = recurrent fall risk Timed up and go (TUG): 23.93 sec w/o AD, 17.03 sec with SPC; >13.5 sec = high fall risk 10 meter walk test: 17.38 sec with SPC - gait speed = 1.89 ft/sec; ,1.8 ft/sec = risk for recurrent falls Dynamic Gait Index: 12/24 - Scores of 19 or less are predictive of falls in older community living adults   TODAY'S TREATMENT:   Self care:   Detailed edu on MRI report; referral to [facility]; management of condition;   10/14/22   Pt seen for aquatic therapy today.  Treatment took place in water  3.25-4.5 ft in depth at the [facility] pool. Temp of water was 91.  Pt entered/exited  the pool via stairs step to pattern with hand rail.   *Side lunges with yellow hand buoys x 4 widths, Ue add/abd *UE support on yellow hand buoys hip circles; df; df;    - add/abd; hip ext squats x 10 *bouncing BB off wall x5  min *4 ft heel walking then toe walking 2 widths ea *Cycling on noodle with hand buoys for " handlebars".     Pt requires the buoyancy and hydrostatic pressure of water for support, and to offload joints by unweighting joint load by at least 50 % in navel deep water and by at least 75-80% in chest to neck deep water.  Viscosity of the water is needed for resistance of strengthening. Water current perturbations provides challenge to standing balance requiring increased core activation.      10/10/22 THERAPEUTIC EXERCISE: to improve flexibility, strength and mobility.  Verbal and tactile cues throughout for technique. UBE L3 3 min fwd/ 3 min back Treadmill 1.50mph 5 min Rec Bike L3x74min Star excursion 1/2 circle pattern 3x bil Standing lat pull + march GTB x 10  Pallof press with GTB x 10 bil  10/07/22   Pt seen for aquatic therapy today.  Treatment took place in water 3.25-4.5 ft in depth at the [facility]. Temp of water was 91.  Pt entered/exited the pool via stairs step to pattern with hand rail.                           *Gait training/Walking forward, back and side stepping with cues for heel strike, toe off and step length/marching  *Side lunges with yellow hand buoys x 4 widths, Ue add/abd *UE support on yellow hand buoys hip circles; df; df; add/abd    - add/abd; hip ext squats x 10 *bouncing BB off wall x 20 rep *4 ft heel walking then toe walking 2 widths ea *Cycling on noodle with hand buoys for " handlebars".     Pt requires the buoyancy and hydrostatic pressure of water for support, and to offload joints by unweighting joint load by at least 50 % in navel deep water and by at least 75-80% in chest to neck deep water.  Viscosity  of the water is needed for resistance of strengthening. Water current perturbations provides challenge to standing balance requiring increased core activation.    10/04/22 Therapeutic Exercise: UBE L1 3 min fwd/ 3 min back Standing open book x 10 bil at wall Standing lumbar ext wall x 10 Wall slides AAROM prayer hands x 10 with slight hold at top CW/CCW ball on wall x 10 each UE Seated green pball slides into shoulder flexion x 10 bil Seated horiz ABD and ER green TB x 10 4 way reach with opp LE SLS 5x  09/26/22 THERAPEUTIC EXERCISE: to improve flexibility, strength and mobility.  Verbal and tactile cues throughout for technique. Rec Bike L3 x 7 min   THERAPEUTIC ACTIVITIES: ROM MMT 5 times sit to stand: 12.84 sec Timed up and go (TUG): 11.56 sec w/o AD, 10.44 sec with SPC 10 meter walk test: 11.74 sec w/o AD, 11.81 with SPC - gait speed = 2.79 ft/sec w/o AD, 2.78 ft/sec with SPC - gait w/o AD with wider BOS and mild ataxia Dynamic Gait Index: 13/24 - Scores of 19 or less are predictive of falls in older community  living adults   09/23/22 THERAPEUTIC EXERCISE: to improve flexibility, strength and mobility.  Verbal and tactile cues throughout for technique. Rec Bike L3 x 7 min  Bridge + GTB clam 10 x 3", 2 sets Sustained bridge + GTB march x 10 R/L sidelying GTB clam 10 x 3" Hooklying dead bug 10 x 3" TrA + sequential 90/90 march 10 x 3" R/L single leg bridge 10 x 3" Hooklying TrA + GTB B shoulder extension 10 x 3"  Hooklying TrA + GTB B scap retraction + shoulder horiz ABD 10 x 3" Hooklying TrA + GTB B scap retraction + shoulder horiz ABD diagonals 10 x 3"  Quadruped alt UE & LE raises x 10 each L mod thomas hip flexor stretch 3 x 30-60"     PATIENT EDUCATION:  Education details:  resume hip stretches from prior PT episode Person educated: Patient Education method: Explanation Education comprehension: verbalized understanding  HOME EXERCISE PROGRAM: Access Code:  [email or web address] URL: [email or web address] Date: 09/11/2022 Prepared by: Annie Paras  Exercises - Seated Table Hamstring Stretch  - 2 x daily - 7 x weekly - 2 sets - 30 sec hold - Supine Figure 4 Piriformis Stretch  - 2 x daily - 7 x weekly - 2 sets - 30 sec hold - Supine Piriformis Stretch with Foot on Ground  - 2 x daily - 7 x weekly - 2 sets - 30 sec hold - Sit to Stand  - 1 x daily - 3 x weekly - 3 sets - 10 reps - Standing Hip Abduction with Resistance at Ankles and Counter Support  - 1 x daily - 3 x weekly - 2 sets - 10 reps - 3 sec hold - Standing Hip Extension with Resistance at Ankles and Counter Support  - 1 x daily - 3 x weekly - 2 sets - 10 reps - 3 sec hold - Marching with Resistance  - 1 x daily - 3 x weekly - 2 sets - 10 reps - 3 sec hold - Standing Hamstring Curl with Resistance  - 1 x daily - 3 x weekly - 2 sets - 10 reps - 3 sec hold - Standing Shoulder Row with Anchored Resistance  - 1 x daily - 3-4 x weekly - 2 sets - 10 reps - 5 sec hold - Scapular Retraction with Resistance Advanced  - 1 x daily - 3-4 x weekly - 2 sets - 10 reps - 5 sec hold - Shoulder External Rotation and Scapular Retraction with Resistance  - 1 x daily - 3 x weekly - 2 sets - 10 reps - 3-5 sec hold   ASSESSMENT:  CLINICAL IMPRESSION: Pt with questions regarding MD appt with neurologist and the referral to [facility] for 2nd opinion. Pt continues to report heaviness in le's. Does state he is now able to reach his RUE behind head which he didn't have the strength to due prior to surgery.  He has begun to notice small Improvements which he finds encouraging. He progresses with aquatic therapy tolerating session well. Improving UE strength as demonstrated through ability to push with good force buoyancy ball onto wall for a continuous 5 minutes which also challenged his aerobic capacity. Goal ongoing     OBJECTIVE IMPAIRMENTS: Abnormal gait, decreased activity tolerance, decreased  balance, decreased endurance, decreased mobility, difficulty walking, decreased strength, decreased safety awareness, increased fascial restrictions, impaired perceived functional ability, increased muscle spasms, impaired flexibility, impaired sensation, improper body mechanics, postural dysfunction, and pain.   ACTIVITY  LIMITATIONS: carrying, lifting, bending, sitting, standing, squatting, sleeping, stairs, transfers, bed mobility, bathing, toileting, dressing, reach over head, hygiene/grooming, locomotion level, and caring for others  PARTICIPATION LIMITATIONS: meal prep, cleaning, laundry, driving, community activity, and yard work  PERSONAL FACTORS: Fitness, Past/current experiences, Time since onset of injury/illness/exacerbation, Transportation, and 3+ comorbidities: ACDF C3-4, C4-5, C5-6 on 07/23/22; L hip pain - severe OA; arthiritis; BPH; anemia; hypothyroidism; MVR & TVR; thyroid disease; vertigo  are also affecting patient's functional outcome.   REHAB POTENTIAL: Good  CLINICAL DECISION MAKING: Evolving/moderate complexity  EVALUATION COMPLEXITY: Moderate   GOALS: Goals reviewed with patient? Yes  SHORT TERM GOALS: Target date: 09/09/2022   Patient will be independent with initial HEP. Baseline:  Goal status: MET  09/03/22  2.  Patient will improve 5x STS time to </= 15 seconds for improved efficiency and safety with transfers Baseline: 16.63 sec Goal status: MET  09/04/11 - 5xSTS = 14.50 sec; 09/26/22 - 12.84 sec  LONG TERM GOALS: Target date: 09/30/2022; extended to 11/07/22  Patient will be independent with advanced/ongoing HEP to improve outcomes and carryover.  Baseline:  Goal status: IN PROGRESS  09/26/22 - met for current HEP  2.  Patient will demonstrate improved B LE strength to >/= 4+/5 for improved stability and ease of mobility. Baseline:  Goal status: IN PROGRESS  09/26/22 - Refer to above MMT table - strength fluctuating from last testing   3.  Patient will be  able to ambulate 600' with LRAD and normal gait pattern across various surfaces to access community.  Baseline:  Goal status: IN PROGRESS - 10/10/22 pt ambulates w/o AD at home, does not walk in community w/o AD  4.  Patient will improve gait velocity to at least 2.62 ft/sec for improved gait efficiency and safety as a community ambulator. Baseline:  Goal status: MET  09/03/22 - Gait speed = 3.04 ft/sec with and w/o SPC; 09/26/22 - 2.79 ft/sec w/o AD, 2.78 ft/sec with SPC - gait w/o AD with wider BOS and mild ataxia  5. Patient will be able to ascend/descend stairs with 1 HR and reciprocal step pattern safely to access home and community.  Baseline:  Goal status: MET  09/26/22  6.  Patient will report 20/80 on LEFS to demonstrate improved functional ability. Baseline: LEFS 10/80 = 12.5% (87.5% disability) Goal status: IN PROGRESS  7.  Patient will demonstrate decreased TUG time to </= 13.5 sec to decrease risk for falls with transitional mobility Baseline: 23.93 sec w/o AD, 17.03 sec with SPC Goal status: MET  09/03/22 - TUG = 12.94 sec w/o AD; 10.85 sec with SPC; 09/26/22 - 11.56 sec w/o AD, 10.44 sec with SPC  9.  Patient will demonstrate at least 19/24 on DGI to decrease risk of falls. Baseline: DGI = 12/24 Goal status: IN PROGRESS 09/26/22 - 13/24    PLAN: PT FREQUENCY: 2x/week  PT DURATION: 4-6 weeks  PLANNED INTERVENTIONS: Therapeutic exercises, Therapeutic activity, Neuromuscular re-education, Balance training, Gait training, Patient/Family education, Self Care, Joint mobilization, Stair training, DME instructions, Dry Needling, Electrical stimulation, Cryotherapy, Moist heat, Taping, Ultrasound, Ionotophoresis 4mg /ml Dexamethasone, Manual therapy, and Re-evaluation  PLAN FOR NEXT SESSION: hip flexibility; LE strengthening; balance/gait stability; postural and UE strengthening; hybrid therapy with 1 visit each land- and aquatic-based per week.   Denton Meek, PT  MPT 10/14/2022, 2:13 PM

## 2022-10-14 ENCOUNTER — Encounter (HOSPITAL_BASED_OUTPATIENT_CLINIC_OR_DEPARTMENT_OTHER): Payer: Self-pay | Admitting: Physical Therapy

## 2022-10-14 ENCOUNTER — Ambulatory Visit (HOSPITAL_BASED_OUTPATIENT_CLINIC_OR_DEPARTMENT_OTHER): Payer: BC Managed Care – PPO | Admitting: Physical Therapy

## 2022-10-14 ENCOUNTER — Telehealth: Payer: Self-pay | Admitting: Neurology

## 2022-10-14 DIAGNOSIS — R2689 Other abnormalities of gait and mobility: Secondary | ICD-10-CM

## 2022-10-14 DIAGNOSIS — R270 Ataxia, unspecified: Secondary | ICD-10-CM | POA: Diagnosis not present

## 2022-10-14 DIAGNOSIS — M6281 Muscle weakness (generalized): Secondary | ICD-10-CM

## 2022-10-14 DIAGNOSIS — R200 Anesthesia of skin: Secondary | ICD-10-CM | POA: Diagnosis not present

## 2022-10-14 DIAGNOSIS — M62838 Other muscle spasm: Secondary | ICD-10-CM | POA: Diagnosis not present

## 2022-10-14 DIAGNOSIS — M4804 Spinal stenosis, thoracic region: Secondary | ICD-10-CM | POA: Diagnosis not present

## 2022-10-14 DIAGNOSIS — M4802 Spinal stenosis, cervical region: Secondary | ICD-10-CM | POA: Diagnosis not present

## 2022-10-14 DIAGNOSIS — R262 Difficulty in walking, not elsewhere classified: Secondary | ICD-10-CM | POA: Diagnosis not present

## 2022-10-14 DIAGNOSIS — R2681 Unsteadiness on feet: Secondary | ICD-10-CM

## 2022-10-14 DIAGNOSIS — M4712 Other spondylosis with myelopathy, cervical region: Secondary | ICD-10-CM | POA: Diagnosis not present

## 2022-10-14 DIAGNOSIS — R269 Unspecified abnormalities of gait and mobility: Secondary | ICD-10-CM | POA: Diagnosis not present

## 2022-10-14 DIAGNOSIS — G9589 Other specified diseases of spinal cord: Secondary | ICD-10-CM | POA: Diagnosis not present

## 2022-10-14 DIAGNOSIS — M48061 Spinal stenosis, lumbar region without neurogenic claudication: Secondary | ICD-10-CM | POA: Diagnosis not present

## 2022-10-14 NOTE — Telephone Encounter (Signed)
Referral sent to [facility], phone # [phone].

## 2022-10-17 ENCOUNTER — Ambulatory Visit: Payer: BC Managed Care – PPO | Admitting: Physical Therapy

## 2022-10-17 ENCOUNTER — Encounter: Payer: Self-pay | Admitting: Physical Therapy

## 2022-10-17 DIAGNOSIS — R2689 Other abnormalities of gait and mobility: Secondary | ICD-10-CM

## 2022-10-17 DIAGNOSIS — M6281 Muscle weakness (generalized): Secondary | ICD-10-CM

## 2022-10-17 DIAGNOSIS — R262 Difficulty in walking, not elsewhere classified: Secondary | ICD-10-CM

## 2022-10-17 DIAGNOSIS — M25552 Pain in left hip: Secondary | ICD-10-CM | POA: Diagnosis not present

## 2022-10-17 DIAGNOSIS — R2681 Unsteadiness on feet: Secondary | ICD-10-CM | POA: Diagnosis not present

## 2022-10-17 NOTE — Therapy (Signed)
OUTPATIENT PHYSICAL THERAPY TREATMENT   Patient Name: Alex West MRN: [id number] DOB:[birth year], 66 y.o., male Today's Date: 10/17/2022      PT End of Session - 10/17/22 1015     Visit Number 14    Date for PT Re-Evaluation 11/07/22    Authorization Type BCBS - VL: 38 (7 used earlier this year)    Progress Note Due on Visit 18   Recert on visit #8 - 61/4/43   PT Start Time 1540    PT Stop Time 1102    PT Time Calculation (min) 47 min    Activity Tolerance Patient tolerated treatment well    Behavior During Therapy WFL for tasks assessed/performed                  Past Medical History:  Diagnosis Date   Arthritis    BPH (benign prostatic hyperplasia)    Colon polyp    ED (erectile dysfunction)    Elevated liver enzymes    Gallstones    Hepatitis    History of anemia    Hypercholesterolemia    Hypothyroidism    Mild tricuspid regurgitation    Mitral valve regurgitation    Thyroid disease    Vertigo    Past Surgical History:  Procedure Laterality Date   ANTERIOR CERVICAL DECOMP/DISCECTOMY FUSION N/A 07/23/2022   Procedure: Anterior Cervical Discectomy and Fusion Cervical Three-Four/Four-Five/Five-Six;  Surgeon: Vallarie Mare, MD;  Location: [employer];  Service: Neurosurgery;  Laterality: N/A;  3C   FLEXIBLE SIGMOIDOSCOPY     Patient Active Problem List   Diagnosis Date Noted   Paresthesia 08/21/2022   Stenosis of cervical spine with myelopathy ([place]) 07/23/2022   Gait abnormality 06/18/2022   Cervical myelopathy ([place]) 06/17/2022   Weakness 06/06/2022   Neck pain 06/06/2022   OA (osteoarthritis) of hip 07/24/2021   Immunosuppressed status ([place]) 09/05/2014   Hepatitis, autoimmune ([place]) 09/05/2014    PCP: Lawerance Cruel, MD  REFERRING PROVIDER: Vallarie Mare, MD  REFERRING DIAG: [phone] (ICD-10-CM) - Cervical spondylosis with myelopathy  THERAPY DIAG:  Other abnormalities of gait and mobility  Muscle weakness  (generalized)  Difficulty in walking, not elsewhere classified  Unsteadiness on feet  RATIONALE FOR EVALUATION AND TREATMENT: Rehabilitation  ONSET DATE: 07/23/2022 - ACDF C3-4, C4-5, C5-6   NEXT MD VISIT: 09/06/22 - surgery f/u  SUBJECTIVE:   SUBJECTIVE STATEMENT: Pt reports his arms still feel like they are burning R>L, esp at night. He also notes feeling that his R HS are always on the verge of a muscle cramp ("charlie horse"). He denies any pain, just the feeling of the "pull" or "heaviness". He also reports feeling of having to consciously avoid elevating/shrugging his shoulder due to avoid the muscle tension from building in his UTs.  PAIN:  Are you having pain? No - L hip  Are you having pain? No - B shoulder and arms   PERTINENT HISTORY: ACDF C3-4, C4-5, C5-6 on 07/23/22; L hip pain - severe OA; arthiritis; BPH; anemia; hypothyroidism; MVR & TVR; thyroid disease; vertigo  PRECAUTIONS: None   WEIGHT BEARING RESTRICTIONS: No  FALLS:  Has patient fallen in last 6 months? No  LIVING ENVIRONMENT: Lives with: lives with their spouse Lives in: House/apartment Stairs: Yes: Internal: 14 steps; on right going up and External: 3 steps; on left going up Has following equipment at home: Single point cane and Walker - 2 wheeled  OCCUPATION: Retired  PLOF: Independent and Leisure: play golf, gym 5x/wk  PATIENT GOALS: "I want to get back to walking normally again."   OBJECTIVE:   DIAGNOSTIC FINDINGS:  09/18/22 - Cervical MRI: IMPRESSION: MRI cervical spine (without) demonstrating: - Anterior cervical discectomy and fusion with metal hardware spanning C3, C4, C5 and C6 levels. - Moderate spinal stenosis at C3-4, C4-5 and C5-6 levels.  Mild myelomalacia noted at C4 level.  20 - Multilevel foraminal stenosis spanning C2-3 down to C7-T1 levels as above. - Compared to MRI from 06/13/2022 there have been interval surgical changes as above. Spinal stenosis has slightly improved from  prior study. 09/18/22 - Thoracic MRI: IMPRESSION: MRI thoracic spine without contrast demonstrating: - At T10-11 broad disc bulging, facet and ligamentum flavum hypertrophy with mild spinal stenosis and mild bilateral foraminal stenosis; subtle increased T2 signal within the spinal cord at T10 level (series 12 image 28, 29) may reflect mild spinal cord edema, syringomyelia or artifact. 09/18/22 - Lumbar MRI: IMPRESSION: MRI lumbar spine (without) demonstrating: - At L3-4: Disc bulging and facet vertebrae with moderate spinal stenosis and mild bilateral foraminal stenosis. - At L4-5: Disc bulging and facet hypertrophy with moderate spinal stenosis and mild bilateral foraminal stenosis. - At L5-S1: Disc bulging and facet hypertrophy with moderate right and mild left foraminal stenosis. - Lumbarization of S1 segment with transitional features.  Lowest visualized intervertebral disc level is labeled S1-2 and the study.  Note enumeration scheme if intervention is planned.  06/13/22 - Cervical MRI:  IMPRESSION: This MRI of the cervical spine without contrast shows the following: Increase signal within the spinal cord adjacent to C3-C4 consistent with compressive myelopathy. At C3-C4 and C5-C6 there is severe spinal stenosis, at C4-C5 there is moderate spinal stenosis and at C2-C3, C6-C7 and C7-T1 there is mild spinal stenosis. There are various degrees of foraminal narrowing at every level in the cervical spine.  This is severe at C3-C4 where there is probable bilateral C4 nerve root compression, and moderately severe at C4-C5 and C5-C6 where there is potential for bilateral C5 and C6 nerve root compression.  PATIENT SURVEYS:  NDI  23/50 = 46.0%, moderate disability LEFS 10/80 = 12.5%, 87.5% disability  COGNITION: Overall cognitive status: Within functional limits for tasks assessed     SENSATION: Light touch: Impaired  Intermittent numbness in R thumb and B hands, burning and tingling in L shoulder  and UE  MUSCLE LENGTH: Hamstrings: mild/mod tight L>R ITB: mod tight B Piriformis: mod/severe tight L, mod tight R Hip flexors: mod tight L>R Quads: mod tight L>R  POSTURE:  No Significant postural limitations and cervical collar in place  CERVICAL ROM:   Active ROM AROM (deg) 09/11/22  Flexion 32  Extension 44  Right lateral flexion 23  Left lateral flexion 13  Right rotation 52  Left rotation 51   (Blank rows = not tested)   UPPER EXTREMITY ROM: Deferred on eval d/t pt reported post-op precautions  Active ROM Right 09/11/22 Left 09/11/22 Right 09/26/22 Left 09/26/22  Shoulder flexion 131 139 124 124  Shoulder extension 36 39 38 32  Shoulder abduction 157 151 104 105  Shoulder adduction      Shoulder internal rotation FIR to L2 FIR to L1    Shoulder external rotation FER - unable to reach behind FER - unable to reach behind    Elbow flexion      Elbow extension      Wrist flexion      Wrist extension      Wrist ulnar deviation  Wrist radial deviation      Wrist pronation      Wrist supination      (Blank rows = not tested)  UPPER EXTREMITY MMT:  Deferred on eval d/t pt reported post-op precautions  MMT Right 09/11/22 Left 09/11/22 Right 09/26/22 Left 09/26/22  Shoulder flexion 4+ 4+ 4+ 4+  Shoulder extension 4 4- 4+ 4+  Shoulder abduction 4+ 4+ 4+ 4+  Shoulder adduction      Shoulder internal rotation 4+ 4+ 4+ 4+  Shoulder external rotation _0 Middle trapezius      Lower trapezius      Elbow flexion 5 4+ 5 5  Elbow extension 4+ 4+ 4+ 4+  Wrist flexion      Wrist extension      Wrist ulnar deviation      Wrist radial deviation      Wrist pronation      Wrist supination      Grip strength (lbs) 75 68    (Blank rows = not tested)  LOWER EXTREMITY ROM: B hip ROM mild to moderately limited in all planes with greatest limitation in L hip flexion and IR/ER  LOWER EXTREMITY MMT: Tested in sitting on eval; *standard testing positions except  extension tested in sidelying on 09/03/22  MMT Right eval Left eval Right 09/03/22 Left 09/03/22 Right 09/26/22 Left 09/26/22  Hip flexion 4+ 4 5 4+ 4+ 4  Hip extension 4 4+ 4+ * 4 * 4- 4-  Hip abduction 4+ 4 4+ 4 4 seated 4- seated  Hip adduction 4+ 4+ 5 4+ 4+ seated 4- seated  Hip internal rotation 4 4 4+ 4+ 4 4  Hip external rotation 4 4 4+ 4+ 4 4  Knee flexion _1 4+ 4+ 4+  Knee extension 4+ 4+ _2 4+  Ankle dorsiflexion 4 4+ 4+ 4+ 5 5  Ankle plantarflexion   5 5- 5- 20 SLS HR (Limited lift) 4 13 SLS HR  Ankle inversion        Ankle eversion         (Blank rows = not tested)  BED MOBILITY:  NT  TRANSFERS: Assistive device utilized: None  Sit to stand: Modified independence Stand to sit: Modified independence Chair to chair:  NT Floor:  NT  GAIT: Distance walked: 60 ft Assistive device utilized: Single point cane Level of assistance: Modified independence Gait pattern: step through pattern, decreased stride length, decreased hip/knee flexion- Right, decreased hip/knee flexion- Left, lateral hip instability, and decreased trunk rotation Comments: 1.89 ft/sec gait speed = limited community ambulator  RAMP: Level of Assistance:  NT Assistive device utilized:  NT Ramp Comments:   CURB:  Level of Assistance:  NT Assistive device utilized:  NT Curb Comments:   STAIRS:  Level of Assistance: SBA  Stair Negotiation Technique: Step to Pattern with Single Rail on Right  Number of Stairs: 14   Height of Stairs: 7  Comments: leads with R LE  FUNCTIONAL TESTS:  5 times sit to stand: 16.63 sec; >15 sec = recurrent fall risk Timed up and go (TUG): 23.93 sec w/o AD, 17.03 sec with SPC; >13.5 sec = high fall risk 10 meter walk test: 17.38 sec with SPC - gait speed = 1.89 ft/sec; ,1.8 ft/sec = risk for recurrent falls Dynamic Gait Index: 12/24 - Scores of 19 or less are predictive of falls in older community living adults   TODAY'S  TREATMENT:     10/17/22  THERAPEUTIC EXERCISE: to improve flexibility, strength and mobility.  Verbal and tactile cues throughout for technique. NuStep - L6 x 6 min (UE/LE) Seated R hip hinge + strap HS stretch 3 x 30-60" Hooklying R HS stretch with strap +/- DF for calf stretch 3 x 30" - pt noting better stretch  MANUAL THERAPY: To promote normalized muscle tension, improved flexibility, and reduced pain. Skilled palpation and monitoring of soft tissue during DN Trigger Point Dry-Needling  Treatment instructions: Expect mild to moderate muscle soreness. Patient verbalized understanding of these instructions and education. Patient Consent Given: Yes Education handout provided: Previously provided Muscles treated: R hamstring Electrical stimulation performed: No Parameters: N/A Treatment response/outcome: Twitch Response Elicited and Palpable Increase in Muscle Length STM/DTM, IASTM, manual TPR and pin & stretch to muscles addressed with DN   10/14/22 Self care:   Detailed edu on MRI report; referral to [facility]; management of condition;   Pt seen for aquatic therapy today.  Treatment took place in water 3.25-4.5 ft in depth at the [facility]. Temp of water was 91.  Pt entered/exited the pool via stairs step to pattern with hand rail.   *Side lunges with yellow hand buoys x 4 widths, Ue add/abd *UE support on yellow hand buoys hip circles; df; df;    - add/abd; hip ext squats x 10 *bouncing BB off wall x5  min *4 ft heel walking then toe walking 2 widths ea *Cycling on noodle with hand buoys for " handlebars".     Pt requires the buoyancy and hydrostatic pressure of water for support, and to offload joints by unweighting joint load by at least 50 % in navel deep water and by at least 75-80% in chest to neck deep water.  Viscosity of the water is needed for resistance of strengthening. Water current perturbations provides challenge to standing balance requiring increased  core activation.   10/10/22 THERAPEUTIC EXERCISE: to improve flexibility, strength and mobility.  Verbal and tactile cues throughout for technique. UBE L3 3 min fwd/ 3 min back Treadmill 1.110mh 5 min Rec Bike L3x411m Star excursion 1/2 circle pattern 3x bil Standing lat pull + march GTB x 10  Pallof press with GTB x 10 bil    PATIENT EDUCATION:  Education details: role of DN and DN rational, procedure, outcomes, potential side effects, and recommended post-treatment exercises/activity Person educated: Patient Education method: Explanation Education comprehension: verbalized understanding  HOME EXERCISE PROGRAM: Access Code: [email or web address]: [email or web address] Date: 09/11/2022 Prepared by: JoAnnie ParasExercises - Seated Table Hamstring Stretch  - 2 x daily - 7 x weekly - 2 sets - 30 sec hold - Supine Figure 4 Piriformis Stretch  - 2 x daily - 7 x weekly - 2 sets - 30 sec hold - Supine Piriformis Stretch with Foot on Ground  - 2 x daily - 7 x weekly - 2 sets - 30 sec hold - Sit to Stand  - 1 x daily - 3 x weekly - 3 sets - 10 reps - Standing Hip Abduction with Resistance at Ankles and Counter Support  - 1 x daily - 3 x weekly - 2 sets - 10 reps - 3 sec hold - Standing Hip Extension with Resistance at Ankles and Counter Support  - 1 x daily - 3 x weekly - 2 sets - 10 reps - 3 sec hold - Marching with Resistance  - 1 x daily - 3 x weekly - 2 sets - 10 reps - 3 sec hold -  Standing Hamstring Curl with Resistance  - 1 x daily - 3 x weekly - 2 sets - 10 reps - 3 sec hold - Standing Shoulder Row with Anchored Resistance  - 1 x daily - 3-4 x weekly - 2 sets - 10 reps - 5 sec hold - Scapular Retraction with Resistance Advanced  - 1 x daily - 3-4 x weekly - 2 sets - 10 reps - 5 sec hold - Shoulder External Rotation and Scapular Retraction with Resistance  - 1 x daily - 3 x weekly - 2 sets - 10 reps - 3-5 sec hold   ASSESSMENT:  CLINICAL IMPRESSION: Alex "[name]"  reports that he feels that the heaviness he has been experiencing is almost like the muscles are constant on the verge of cramping ("charlie horse"), esp in his R HS. Taut bands identified in R HS which appeared amenable to DN. After review of DN rational, procedures, outcomes and potential side effects,  patient verbalized consent to DN treatment in conjunction with manual STM/DTM and TPR to reduce ttp/muscle tension. Muscles treated as indicated above. DN produced normal response with good twitches elicited resulting in palpable reduction in pain/ttp and muscle tension, however pt requesting remaining tightness be addressed with more hands-on manual therapy than DN therefore only 1 needle used with remaining areas addressed with STM, DTM, IASTM and MFR. MT followed by review of relevant stretching with pt noting better stretch with hooklying vs seated HS stretch. Pt educated to expect mild to moderate muscle soreness for up to 24-48 hrs and instructed to continue prescribed home exercise program and current activity level with pt verbalizing understanding of theses instructions. [name] will continue to benefit from skilled PT to further improve strength  and promote normalization of muscle activity.  OBJECTIVE IMPAIRMENTS: Abnormal gait, decreased activity tolerance, decreased balance, decreased endurance, decreased mobility, difficulty walking, decreased strength, decreased safety awareness, increased fascial restrictions, impaired perceived functional ability, increased muscle spasms, impaired flexibility, impaired sensation, improper body mechanics, postural dysfunction, and pain.   ACTIVITY LIMITATIONS: carrying, lifting, bending, sitting, standing, squatting, sleeping, stairs, transfers, bed mobility, bathing, toileting, dressing, reach over head, hygiene/grooming, locomotion level, and caring for others  PARTICIPATION LIMITATIONS: meal prep, cleaning, laundry, driving, community activity, and yard  work  PERSONAL FACTORS: Fitness, Past/current experiences, Time since onset of injury/illness/exacerbation, Transportation, and 3+ comorbidities: ACDF C3-4, C4-5, C5-6 on 07/23/22; L hip pain - severe OA; arthiritis; BPH; anemia; hypothyroidism; MVR & TVR; thyroid disease; vertigo  are also affecting patient's functional outcome.   REHAB POTENTIAL: Good  CLINICAL DECISION MAKING: Evolving/moderate complexity  EVALUATION COMPLEXITY: Moderate   GOALS: Goals reviewed with patient? Yes  SHORT TERM GOALS: Target date: 09/09/2022   Patient will be independent with initial HEP. Baseline:  Goal status: MET  09/03/22  2.  Patient will improve 5x STS time to </= 15 seconds for improved efficiency and safety with transfers Baseline: 16.63 sec Goal status: MET  09/04/11 - 5xSTS = 14.50 sec; 09/26/22 - 12.84 sec  LONG TERM GOALS: Target date: 09/30/2022; extended to 11/07/22  Patient will be independent with advanced/ongoing HEP to improve outcomes and carryover.  Baseline:  Goal status: IN PROGRESS  09/26/22 - met for current HEP  2.  Patient will demonstrate improved B LE strength to >/= 4+/5 for improved stability and ease of mobility. Baseline:  Goal status: IN PROGRESS  09/26/22 - Refer to above MMT table - strength fluctuating from last testing   3.  Patient will be able to  ambulate 600' with LRAD and normal gait pattern across various surfaces to access community.  Baseline:  Goal status: IN PROGRESS - 10/10/22 pt ambulates w/o AD at home, does not walk in community w/o AD  4.  Patient will improve gait velocity to at least 2.62 ft/sec for improved gait efficiency and safety as a community ambulator. Baseline:  Goal status: MET  09/03/22 - Gait speed = 3.04 ft/sec with and w/o SPC; 09/26/22 - 2.79 ft/sec w/o AD, 2.78 ft/sec with SPC - gait w/o AD with wider BOS and mild ataxia  5. Patient will be able to ascend/descend stairs with 1 HR and reciprocal step pattern safely to access home and  community.  Baseline:  Goal status: MET  09/26/22  6.  Patient will report 20/80 on LEFS to demonstrate improved functional ability. Baseline: LEFS 10/80 = 12.5% (87.5% disability) Goal status: IN PROGRESS  7.  Patient will demonstrate decreased TUG time to </= 13.5 sec to decrease risk for falls with transitional mobility Baseline: 23.93 sec w/o AD, 17.03 sec with SPC Goal status: MET  09/03/22 - TUG = 12.94 sec w/o AD; 10.85 sec with SPC; 09/26/22 - 11.56 sec w/o AD, 10.44 sec with SPC  9.  Patient will demonstrate at least 19/24 on DGI to decrease risk of falls. Baseline: DGI = 12/24 Goal status: IN PROGRESS 09/26/22 - 13/24    PLAN: PT FREQUENCY: 2x/week  PT DURATION: 4-6 weeks  PLANNED INTERVENTIONS: Therapeutic exercises, Therapeutic activity, Neuromuscular re-education, Balance training, Gait training, Patient/Family education, Self Care, Joint mobilization, Stair training, DME instructions, Dry Needling, Electrical stimulation, Cryotherapy, Moist heat, Taping, Ultrasound, Ionotophoresis 63m/ml Dexamethasone, Manual therapy, and Re-evaluation  PLAN FOR NEXT SESSION: hip flexibility; LE strengthening; balance/gait stability; postural and UE strengthening; hybrid therapy with 1 visit each land- and aquatic-based per week.   JPercival Spanish PT MPT 10/17/2022, 12:10 PM

## 2022-10-20 NOTE — Therapy (Signed)
OUTPATIENT PHYSICAL THERAPY TREATMENT   Patient Name: Alex West MRN: [id number] DOB:20-Apr-1956, 66 y.o., male Today's Date: 10/21/2022      PT End of Session - 10/21/22 0955     Visit Number 14    Date for PT Re-Evaluation 11/07/22    Authorization Type [employer]BS - VL: 3 (7 used earlier this year)    PT Start Time 0945    PT Stop Time 25    PT Time Calculation (min) 45 min    Activity Tolerance Patient tolerated treatment well    Behavior During Therapy WFL for tasks assessed/performed                   Past Medical History:  Diagnosis Date   Arthritis    BPH (benign prostatic hyperplasia)    Colon polyp    ED (erectile dysfunction)    Elevated liver enzymes    Gallstones    Hepatitis    History of anemia    Hypercholesterolemia    Hypothyroidism    Mild tricuspid regurgitation    Mitral valve regurgitation    Thyroid disease    Vertigo    Past Surgical History:  Procedure Laterality Date   ANTERIOR CERVICAL DECOMP/DISCECTOMY FUSION N/A 07/23/2022   Procedure: Anterior Cervical Discectomy and Fusion Cervical Three-Four/Four-Five/Five-Six;  Surgeon: Vallarie Mare, MD;  Location: [facility];  Service: Neurosurgery;  Laterality: N/A;  3C   FLEXIBLE SIGMOIDOSCOPY     Patient Active Problem List   Diagnosis Date Noted   Paresthesia 08/21/2022   Stenosis of cervical spine with myelopathy ([place]) 07/23/2022   Gait abnormality 06/18/2022   Cervical myelopathy ([place]) 06/17/2022   Weakness 06/06/2022   Neck pain 06/06/2022   OA (osteoarthritis) of hip 07/24/2021   Immunosuppressed status ([place]) 09/05/2014   Hepatitis, autoimmune ([place]) 09/05/2014    PCP: Lawerance Cruel, MD  REFERRING PROVIDER: Vallarie Mare, MD  REFERRING DIAG: [phone] (ICD-10-CM) - Cervical spondylosis with myelopathy  THERAPY DIAG:  Other abnormalities of gait and mobility  Muscle weakness (generalized)  Difficulty in walking, not elsewhere classified  Unsteadiness  on feet  RATIONALE FOR EVALUATION AND TREATMENT: Rehabilitation  ONSET DATE: 07/23/2022 - ACDF C3-4, C4-5, C5-6   NEXT MD VISIT: 09/06/22 - surgery f/u  SUBJECTIVE:   SUBJECTIVE STATEMENT: Pt reports he is beginning to see some improvements, started walking better and feeling better. Arms do continue to burn usually when skin feels cold to the touch. Right hamstring massage and DN seems to have helped some with the heaviness.      PAIN:  Are you having pain? No - L hip  Are you having pain? No - B shoulder and arms   PERTINENT HISTORY: ACDF C3-4, C4-5, C5-6 on 07/23/22; L hip pain - severe OA; arthiritis; BPH; anemia; hypothyroidism; MVR & TVR; thyroid disease; vertigo  PRECAUTIONS: None   WEIGHT BEARING RESTRICTIONS: No  FALLS:  Has patient fallen in last 6 months? No  LIVING ENVIRONMENT: Lives with: lives with their spouse Lives in: House/apartment Stairs: Yes: Internal: 14 steps; on right going up and External: 3 steps; on left going up Has following equipment at home: Single point cane and Walker - 2 wheeled  OCCUPATION: Retired  PLOF: Independent and Leisure: play golf, gym 5x/wk   PATIENT GOALS: "I want to get back to walking normally again."   OBJECTIVE:   DIAGNOSTIC FINDINGS:  09/18/22 - Cervical MRI: IMPRESSION: MRI cervical spine (without) demonstrating: - Anterior cervical discectomy and fusion with  metal hardware spanning C3, C4, C5 and C6 levels. - Moderate spinal stenosis at C3-4, C4-5 and C5-6 levels.  Mild myelomalacia noted at C4 level.  20 - Multilevel foraminal stenosis spanning C2-3 down to C7-T1 levels as above. - Compared to MRI from 06/13/2022 there have been interval surgical changes as above. Spinal stenosis has slightly improved from prior study. 09/18/22 - Thoracic MRI: IMPRESSION: MRI thoracic spine without contrast demonstrating: - At T10-11 broad disc bulging, facet and ligamentum flavum hypertrophy with mild spinal stenosis and mild  bilateral foraminal stenosis; subtle increased T2 signal within the spinal cord at T10 level (series 12 image 28, 29) may reflect mild spinal cord edema, syringomyelia or artifact. 09/18/22 - Lumbar MRI: IMPRESSION: MRI lumbar spine (without) demonstrating: - At L3-4: Disc bulging and facet vertebrae with moderate spinal stenosis and mild bilateral foraminal stenosis. - At L4-5: Disc bulging and facet hypertrophy with moderate spinal stenosis and mild bilateral foraminal stenosis. - At L5-S1: Disc bulging and facet hypertrophy with moderate right and mild left foraminal stenosis. - Lumbarization of S1 segment with transitional features.  Lowest visualized intervertebral disc level is labeled S1-2 and the study.  Note enumeration scheme if intervention is planned.  06/13/22 - Cervical MRI:  IMPRESSION: This MRI of the cervical spine without contrast shows the following: Increase signal within the spinal cord adjacent to C3-C4 consistent with compressive myelopathy. At C3-C4 and C5-C6 there is severe spinal stenosis, at C4-C5 there is moderate spinal stenosis and at C2-C3, C6-C7 and C7-T1 there is mild spinal stenosis. There are various degrees of foraminal narrowing at every level in the cervical spine.  This is severe at C3-C4 where there is probable bilateral C4 nerve root compression, and moderately severe at C4-C5 and C5-C6 where there is potential for bilateral C5 and C6 nerve root compression.  PATIENT SURVEYS:  NDI  23/50 = 46.0%, moderate disability LEFS 10/80 = 12.5%, 87.5% disability  COGNITION: Overall cognitive status: Within functional limits for tasks assessed     SENSATION: Light touch: Impaired  Intermittent numbness in R thumb and B hands, burning and tingling in L shoulder and UE  MUSCLE LENGTH: Hamstrings: mild/mod tight L>R ITB: mod tight B Piriformis: mod/severe tight L, mod tight R Hip flexors: mod tight L>R Quads: mod tight L>R  POSTURE:  No Significant postural  limitations and cervical collar in place  CERVICAL ROM:   Active ROM AROM (deg) 09/11/22  Flexion 32  Extension 44  Right lateral flexion 23  Left lateral flexion 13  Right rotation 52  Left rotation 51   (Blank rows = not tested)   UPPER EXTREMITY ROM: Deferred on eval d/t pt reported post-op precautions  Active ROM Right 09/11/22 Left 09/11/22 Right 09/26/22 Left 09/26/22  Shoulder flexion 131 139 124 124  Shoulder extension 36 39 38 32  Shoulder abduction 157 151 104 105  Shoulder adduction      Shoulder internal rotation FIR to L2 FIR to L1    Shoulder external rotation FER - unable to reach behind FER - unable to reach behind    Elbow flexion      Elbow extension      Wrist flexion      Wrist extension      Wrist ulnar deviation      Wrist radial deviation      Wrist pronation      Wrist supination      (Blank rows = not tested)  UPPER EXTREMITY MMT:  Deferred on  eval d/t pt reported post-op precautions  MMT Right 09/11/22 Left 09/11/22 Right 09/26/22 Left 09/26/22  Shoulder flexion 4+ 4+ 4+ 4+  Shoulder extension 4 4- 4+ 4+  Shoulder abduction 4+ 4+ 4+ 4+  Shoulder adduction      Shoulder internal rotation 4+ 4+ 4+ 4+  Shoulder external rotation _0 Middle trapezius      Lower trapezius      Elbow flexion 5 4+ 5 5  Elbow extension 4+ 4+ 4+ 4+  Wrist flexion      Wrist extension      Wrist ulnar deviation      Wrist radial deviation      Wrist pronation      Wrist supination      Grip strength (lbs) 75 68    (Blank rows = not tested)  LOWER EXTREMITY ROM: B hip ROM mild to moderately limited in all planes with greatest limitation in L hip flexion and IR/ER  LOWER EXTREMITY MMT: Tested in sitting on eval; *standard testing positions except extension tested in sidelying on 09/03/22  MMT Right eval Left eval Right 09/03/22 Left 09/03/22 Right 09/26/22 Left 09/26/22  Hip flexion 4+ 4 5 4+ 4+ 4  Hip extension 4 4+ 4+ * 4 * 4- 4-  Hip abduction 4+ 4  4+ 4 4 seated 4- seated  Hip adduction 4+ 4+ 5 4+ 4+ seated 4- seated  Hip internal rotation 4 4 4+ 4+ 4 4  Hip external rotation 4 4 4+ 4+ 4 4  Knee flexion _1 4+ 4+ 4+  Knee extension 4+ 4+ _2 4+  Ankle dorsiflexion 4 4+ 4+ 4+ 5 5  Ankle plantarflexion   5 5- 5- 20 SLS HR (Limited lift) 4 13 SLS HR  Ankle inversion        Ankle eversion         (Blank rows = not tested)  BED MOBILITY:  NT  TRANSFERS: Assistive device utilized: None  Sit to stand: Modified independence Stand to sit: Modified independence Chair to chair:  NT Floor:  NT  GAIT: Distance walked: 60 ft Assistive device utilized: Single point cane Level of assistance: Modified independence Gait pattern: step through pattern, decreased stride length, decreased hip/knee flexion- Right, decreased hip/knee flexion- Left, lateral hip instability, and decreased trunk rotation Comments: 1.89 ft/sec gait speed = limited community ambulator  RAMP: Level of Assistance:  NT Assistive device utilized:  NT Ramp Comments:   CURB:  Level of Assistance:  NT Assistive device utilized:  NT Curb Comments:   STAIRS:  Level of Assistance: SBA  Stair Negotiation Technique: Step to Pattern with Single Rail on Right  Number of Stairs: 14   Height of Stairs: 7  Comments: leads with R LE  FUNCTIONAL TESTS:  5 times sit to stand: 16.63 sec; >15 sec = recurrent fall risk Timed up and go (TUG): 23.93 sec w/o AD, 17.03 sec with SPC; >13.5 sec = high fall risk 10 meter walk test: 17.38 sec with SPC - gait speed = 1.89 ft/sec; ,1.8 ft/sec = risk for recurrent falls Dynamic Gait Index: 12/24 - Scores of 19 or less are predictive of falls in older community living adults   TODAY'S TREATMENT:    10/21/22  Pt seen for aquatic therapy today.  Treatment took place in water 3.25-4.5 ft in depth at the [facility]. Temp of water was 91.  Pt entered/exited the pool via stairs step to  pattern with hand rail.    *Side lunges with yellow hand buoys x 4 widths, Ue add/abd *Plank on 3rd step with hip leg lift and glute contraction x 10 *Plank on yellow noodle 3 x 20s hold *Plank with arm 2 x5 using yellow noodle *bouncing BB off wall using right, then left then bilaterally x10 ea *Cycling on noodle with hand buoys for " handlebars".     Pt requires the buoyancy and hydrostatic pressure of water for support, and to offload joints by unweighting joint load by at least 50 % in navel deep water and by at least 75-80% in chest to neck deep water.  Viscosity of the water is needed for resistance of strengthening. Water current perturbations provides challenge to standing balance requiring increased core activation.    10/17/22 THERAPEUTIC EXERCISE: to improve flexibility, strength and mobility.  Verbal and tactile cues throughout for technique. NuStep - L6 x 6 min (UE/LE) Seated R hip hinge + strap HS stretch 3 x 30-60" Hooklying R HS stretch with strap +/- DF for calf stretch 3 x 30" - pt noting better stretch  MANUAL THERAPY: To promote normalized muscle tension, improved flexibility, and reduced pain. Skilled palpation and monitoring of soft tissue during DN Trigger Point Dry-Needling  Treatment instructions: Expect mild to moderate muscle soreness. Patient verbalized understanding of these instructions and education. Patient Consent Given: Yes Education handout provided: Previously provided Muscles treated: R hamstring Electrical stimulation performed: No Parameters: N/A Treatment response/outcome: Twitch Response Elicited and Palpable Increase in Muscle Length STM/DTM, IASTM, manual TPR and pin & stretch to muscles addressed with DN   10/14/22 Self care:   Detailed edu on MRI report; referral to [facility]; management of condition;   Pt seen for aquatic therapy today.  Treatment took place in water 3.25-4.5 ft in depth at the [facility]. Temp of water was 91.  Pt entered/exited the  pool via stairs step to pattern with hand rail.   *Side lunges with yellow hand buoys x 4 widths, Ue add/abd *UE support on yellow hand buoys hip circles; df; df;    - add/abd; hip ext squats x 10 *bouncing BB off wall x5  min *4 ft heel walking then toe walking 2 widths ea *Cycling on noodle with hand buoys for " handlebars".     Pt requires the buoyancy and hydrostatic pressure of water for support, and to offload joints by unweighting joint load by at least 50 % in navel deep water and by at least 75-80% in chest to neck deep water.  Viscosity of the water is needed for resistance of strengthening. Water current perturbations provides challenge to standing balance requiring increased core activation.   10/10/22 THERAPEUTIC EXERCISE: to improve flexibility, strength and mobility.  Verbal and tactile cues throughout for technique. UBE L3 3 min fwd/ 3 min back Treadmill 1.25mh 5 min Rec Bike L3x464m Star excursion 1/2 circle pattern 3x bil Standing lat pull + march GTB x 10  Pallof press with GTB x 10 bil    PATIENT EDUCATION:  Education details: role of DN and DN rational, procedure, outcomes, potential side effects, and recommended post-treatment exercises/activity Person educated: Patient Education method: Explanation Education comprehension: verbalized understanding  HOME EXERCISE PROGRAM: Access Code: [email or web address]: [email or web address] Date: 09/11/2022 Prepared by: JoAnnie ParasExercises - Seated Table Hamstring Stretch  - 2 x daily - 7 x weekly - 2 sets - 30 sec hold - Supine Figure 4 Piriformis Stretch  - 2 x daily -  7 x weekly - 2 sets - 30 sec hold - Supine Piriformis Stretch with Foot on Ground  - 2 x daily - 7 x weekly - 2 sets - 30 sec hold - Sit to Stand  - 1 x daily - 3 x weekly - 3 sets - 10 reps - Standing Hip Abduction with Resistance at Ankles and Counter Support  - 1 x daily - 3 x weekly - 2 sets - 10 reps - 3 sec hold - Standing Hip  Extension with Resistance at Ankles and Counter Support  - 1 x daily - 3 x weekly - 2 sets - 10 reps - 3 sec hold - Marching with Resistance  - 1 x daily - 3 x weekly - 2 sets - 10 reps - 3 sec hold - Standing Hamstring Curl with Resistance  - 1 x daily - 3 x weekly - 2 sets - 10 reps - 3 sec hold - Standing Shoulder Row with Anchored Resistance  - 1 x daily - 3-4 x weekly - 2 sets - 10 reps - 5 sec hold - Scapular Retraction with Resistance Advanced  - 1 x daily - 3-4 x weekly - 2 sets - 10 reps - 5 sec hold - Shoulder External Rotation and Scapular Retraction with Resistance  - 1 x daily - 3 x weekly - 2 sets - 10 reps - 3-5 sec hold   ASSESSMENT:  CLINICAL IMPRESSION: Introduced Best boy today for core engagement for balance and gait stability. He demonstrates good mindfulness and execution although weakness.  He has difficulty holding position without body movements but can sustain for > 20-25 s and requires multiple rest periods throughout. He continues to have some difficulty coordinating cycling on noodle may in part be due to his height and the relatively shallowness of the pool (in regards to him). No complaints of pain throughout session. He feels a good overall muscle engagement with Pilates.    OBJECTIVE IMPAIRMENTS: Abnormal gait, decreased activity tolerance, decreased balance, decreased endurance, decreased mobility, difficulty walking, decreased strength, decreased safety awareness, increased fascial restrictions, impaired perceived functional ability, increased muscle spasms, impaired flexibility, impaired sensation, improper body mechanics, postural dysfunction, and pain.   ACTIVITY LIMITATIONS: carrying, lifting, bending, sitting, standing, squatting, sleeping, stairs, transfers, bed mobility, bathing, toileting, dressing, reach over head, hygiene/grooming, locomotion level, and caring for others  PARTICIPATION LIMITATIONS: meal prep, cleaning, laundry, driving, community  activity, and yard work  PERSONAL FACTORS: Fitness, Past/current experiences, Time since onset of injury/illness/exacerbation, Transportation, and 3+ comorbidities: ACDF C3-4, C4-5, C5-6 on 07/23/22; L hip pain - severe OA; arthiritis; BPH; anemia; hypothyroidism; MVR & TVR; thyroid disease; vertigo  are also affecting patient's functional outcome.   REHAB POTENTIAL: Good  CLINICAL DECISION MAKING: Evolving/moderate complexity  EVALUATION COMPLEXITY: Moderate   GOALS: Goals reviewed with patient? Yes  SHORT TERM GOALS: Target date: 09/09/2022   Patient will be independent with initial HEP. Baseline:  Goal status: MET  09/03/22  2.  Patient will improve 5x STS time to </= 15 seconds for improved efficiency and safety with transfers Baseline: 16.63 sec Goal status: MET  09/04/11 - 5xSTS = 14.50 sec; 09/26/22 - 12.84 sec  LONG TERM GOALS: Target date: 09/30/2022; extended to 11/07/22  Patient will be independent with advanced/ongoing HEP to improve outcomes and carryover.  Baseline:  Goal status: IN PROGRESS  09/26/22 - met for current HEP  2.  Patient will demonstrate improved B LE strength to >/= 4+/5 for improved stability and  ease of mobility. Baseline:  Goal status: IN PROGRESS  09/26/22 - Refer to above MMT table - strength fluctuating from last testing   3.  Patient will be able to ambulate 600' with LRAD and normal gait pattern across various surfaces to access community.  Baseline:  Goal status: IN PROGRESS - 10/10/22 pt ambulates w/o AD at home, does not walk in community w/o AD  4.  Patient will improve gait velocity to at least 2.62 ft/sec for improved gait efficiency and safety as a community ambulator. Baseline:  Goal status: MET  09/03/22 - Gait speed = 3.04 ft/sec with and w/o SPC; 09/26/22 - 2.79 ft/sec w/o AD, 2.78 ft/sec with SPC - gait w/o AD with wider BOS and mild ataxia  5. Patient will be able to ascend/descend stairs with 1 HR and reciprocal step pattern safely to  access home and community.  Baseline:  Goal status: MET  09/26/22  6.  Patient will report 20/80 on LEFS to demonstrate improved functional ability. Baseline: LEFS 10/80 = 12.5% (87.5% disability) Goal status: IN PROGRESS  7.  Patient will demonstrate decreased TUG time to </= 13.5 sec to decrease risk for falls with transitional mobility Baseline: 23.93 sec w/o AD, 17.03 sec with SPC Goal status: MET  09/03/22 - TUG = 12.94 sec w/o AD; 10.85 sec with SPC; 09/26/22 - 11.56 sec w/o AD, 10.44 sec with SPC  9.  Patient will demonstrate at least 19/24 on DGI to decrease risk of falls. Baseline: DGI = 12/24 Goal status: IN PROGRESS 09/26/22 - 13/24    PLAN: PT FREQUENCY: 2x/week  PT DURATION: 4-6 weeks  PLANNED INTERVENTIONS: Therapeutic exercises, Therapeutic activity, Neuromuscular re-education, Balance training, Gait training, Patient/Family education, Self Care, Joint mobilization, Stair training, DME instructions, Dry Needling, Electrical stimulation, Cryotherapy, Moist heat, Taping, Ultrasound, Ionotophoresis 45m/ml Dexamethasone, Manual therapy, and Re-evaluation  PLAN FOR NEXT SESSION: hip flexibility; LE strengthening; balance/gait stability; postural and UE strengthening; hybrid therapy with 1 visit each land- and aquatic-based per week.   FDenton Meek PT MPT 10/21/2022, 9:55 AM

## 2022-10-21 ENCOUNTER — Encounter (HOSPITAL_BASED_OUTPATIENT_CLINIC_OR_DEPARTMENT_OTHER): Payer: Self-pay | Admitting: Physical Therapy

## 2022-10-21 ENCOUNTER — Ambulatory Visit (HOSPITAL_BASED_OUTPATIENT_CLINIC_OR_DEPARTMENT_OTHER): Payer: BC Managed Care – PPO | Admitting: Physical Therapy

## 2022-10-21 DIAGNOSIS — M4804 Spinal stenosis, thoracic region: Secondary | ICD-10-CM | POA: Diagnosis not present

## 2022-10-21 DIAGNOSIS — R200 Anesthesia of skin: Secondary | ICD-10-CM | POA: Diagnosis not present

## 2022-10-21 DIAGNOSIS — G9589 Other specified diseases of spinal cord: Secondary | ICD-10-CM | POA: Diagnosis not present

## 2022-10-21 DIAGNOSIS — M4712 Other spondylosis with myelopathy, cervical region: Secondary | ICD-10-CM | POA: Diagnosis not present

## 2022-10-21 DIAGNOSIS — M62838 Other muscle spasm: Secondary | ICD-10-CM | POA: Diagnosis not present

## 2022-10-21 DIAGNOSIS — R262 Difficulty in walking, not elsewhere classified: Secondary | ICD-10-CM

## 2022-10-21 DIAGNOSIS — M6281 Muscle weakness (generalized): Secondary | ICD-10-CM

## 2022-10-21 DIAGNOSIS — R269 Unspecified abnormalities of gait and mobility: Secondary | ICD-10-CM | POA: Diagnosis not present

## 2022-10-21 DIAGNOSIS — R270 Ataxia, unspecified: Secondary | ICD-10-CM | POA: Diagnosis not present

## 2022-10-21 DIAGNOSIS — R2681 Unsteadiness on feet: Secondary | ICD-10-CM

## 2022-10-21 DIAGNOSIS — M4802 Spinal stenosis, cervical region: Secondary | ICD-10-CM | POA: Diagnosis not present

## 2022-10-21 DIAGNOSIS — M48061 Spinal stenosis, lumbar region without neurogenic claudication: Secondary | ICD-10-CM | POA: Diagnosis not present

## 2022-10-21 DIAGNOSIS — R2689 Other abnormalities of gait and mobility: Secondary | ICD-10-CM

## 2022-10-24 ENCOUNTER — Ambulatory Visit: Payer: BC Managed Care – PPO | Admitting: Physical Therapy

## 2022-10-29 ENCOUNTER — Ambulatory Visit (HOSPITAL_BASED_OUTPATIENT_CLINIC_OR_DEPARTMENT_OTHER): Payer: BC Managed Care – PPO | Attending: Neurosurgery | Admitting: Physical Therapy

## 2022-10-29 ENCOUNTER — Encounter (HOSPITAL_BASED_OUTPATIENT_CLINIC_OR_DEPARTMENT_OTHER): Payer: Self-pay | Admitting: Physical Therapy

## 2022-10-29 DIAGNOSIS — R2689 Other abnormalities of gait and mobility: Secondary | ICD-10-CM | POA: Diagnosis not present

## 2022-10-29 DIAGNOSIS — R262 Difficulty in walking, not elsewhere classified: Secondary | ICD-10-CM | POA: Insufficient documentation

## 2022-10-29 DIAGNOSIS — M6281 Muscle weakness (generalized): Secondary | ICD-10-CM | POA: Insufficient documentation

## 2022-10-29 DIAGNOSIS — R2681 Unsteadiness on feet: Secondary | ICD-10-CM | POA: Insufficient documentation

## 2022-10-29 NOTE — Therapy (Signed)
OUTPATIENT PHYSICAL THERAPY TREATMENT   Patient Name: Alex West MRN: [id number] DOB:[birth year], 66 y.o., male Today's Date: 10/29/2022      PT End of Session - 10/29/22 1032     Visit Number 16    Date for PT Re-Evaluation 11/07/22    Authorization Type [employer] - VL: 64 (7 used earlier this year)    Progress Note Due on Visit 30    PT Start Time 1030    PT Stop Time 1110    PT Time Calculation (min) 40 min    Activity Tolerance Patient tolerated treatment well    Behavior During Therapy WFL for tasks assessed/performed                   Past Medical History:  Diagnosis Date   Arthritis    BPH (benign prostatic hyperplasia)    Colon polyp    ED (erectile dysfunction)    Elevated liver enzymes    Gallstones    Hepatitis    History of anemia    Hypercholesterolemia    Hypothyroidism    Mild tricuspid regurgitation    Mitral valve regurgitation    Thyroid disease    Vertigo    Past Surgical History:  Procedure Laterality Date   ANTERIOR CERVICAL DECOMP/DISCECTOMY FUSION N/A 07/23/2022   Procedure: Anterior Cervical Discectomy and Fusion Cervical Three-Four/Four-Five/Five-Six;  Surgeon: Vallarie Mare, MD;  Location: [facility];  Service: Neurosurgery;  Laterality: N/A;  3C   FLEXIBLE SIGMOIDOSCOPY     Patient Active Problem List   Diagnosis Date Noted   Paresthesia 08/21/2022   Stenosis of cervical spine with myelopathy ([place]) 07/23/2022   Gait abnormality 06/18/2022   Cervical myelopathy ([place]) 06/17/2022   Weakness 06/06/2022   Neck pain 06/06/2022   OA (osteoarthritis) of hip 07/24/2021   Immunosuppressed status ([place]) 09/05/2014   Hepatitis, autoimmune ([place]) 09/05/2014    PCP: Lawerance Cruel, MD  REFERRING PROVIDER: Vallarie Mare, MD  REFERRING DIAG: [phone] (ICD-10-CM) - Cervical spondylosis with myelopathy  THERAPY DIAG:  Other abnormalities of gait and mobility  Muscle weakness (generalized)  Difficulty in walking, not  elsewhere classified  Unsteadiness on feet  RATIONALE FOR EVALUATION AND TREATMENT: Rehabilitation  ONSET DATE: 07/23/2022 - ACDF C3-4, C4-5, C5-6   NEXT MD VISIT: 09/06/22 - surgery f/u  SUBJECTIVE:   SUBJECTIVE STATEMENT: "I feel like I'm making progress.  I'm getting stronger".  He reports limited relief with DN, but some relief with TENS unit at home.  L hip has been painful since having deep tissue massage 1 wk ago.      PAIN:  Are you having pain? yes - L hip; 5/10; constant dull ache  Are you having pain? yes - B shoulder and arms ; 8/10 - burning  PERTINENT HISTORY: ACDF C3-4, C4-5, C5-6 on 07/23/22; L hip pain - severe OA; arthiritis; BPH; anemia; hypothyroidism; MVR & TVR; thyroid disease; vertigo  PRECAUTIONS: None   WEIGHT BEARING RESTRICTIONS: No  FALLS:  Has patient fallen in last 6 months? No  LIVING ENVIRONMENT: Lives with: lives with their spouse Lives in: House/apartment Stairs: Yes: Internal: 14 steps; on right going up and External: 3 steps; on left going up Has following equipment at home: Single point cane and Walker - 2 wheeled  OCCUPATION: Retired  PLOF: Independent and Leisure: play golf, gym 5x/wk   PATIENT GOALS: "I want to get back to walking normally again."   OBJECTIVE:   DIAGNOSTIC FINDINGS:  09/18/22 - Cervical  MRI: IMPRESSION: MRI cervical spine (without) demonstrating: - Anterior cervical discectomy and fusion with metal hardware spanning C3, C4, C5 and C6 levels. - Moderate spinal stenosis at C3-4, C4-5 and C5-6 levels.  Mild myelomalacia noted at C4 level.  20 - Multilevel foraminal stenosis spanning C2-3 down to C7-T1 levels as above. - Compared to MRI from 06/13/2022 there have been interval surgical changes as above. Spinal stenosis has slightly improved from prior study. 09/18/22 - Thoracic MRI: IMPRESSION: MRI thoracic spine without contrast demonstrating: - At T10-11 broad disc bulging, facet and ligamentum flavum hypertrophy  with mild spinal stenosis and mild bilateral foraminal stenosis; subtle increased T2 signal within the spinal cord at T10 level (series 12 image 28, 29) may reflect mild spinal cord edema, syringomyelia or artifact. 09/18/22 - Lumbar MRI: IMPRESSION: MRI lumbar spine (without) demonstrating: - At L3-4: Disc bulging and facet vertebrae with moderate spinal stenosis and mild bilateral foraminal stenosis. - At L4-5: Disc bulging and facet hypertrophy with moderate spinal stenosis and mild bilateral foraminal stenosis. - At L5-S1: Disc bulging and facet hypertrophy with moderate right and mild left foraminal stenosis. - Lumbarization of S1 segment with transitional features.  Lowest visualized intervertebral disc level is labeled S1-2 and the study.  Note enumeration scheme if intervention is planned.  06/13/22 - Cervical MRI:  IMPRESSION: This MRI of the cervical spine without contrast shows the following: Increase signal within the spinal cord adjacent to C3-C4 consistent with compressive myelopathy. At C3-C4 and C5-C6 there is severe spinal stenosis, at C4-C5 there is moderate spinal stenosis and at C2-C3, C6-C7 and C7-T1 there is mild spinal stenosis. There are various degrees of foraminal narrowing at every level in the cervical spine.  This is severe at C3-C4 where there is probable bilateral C4 nerve root compression, and moderately severe at C4-C5 and C5-C6 where there is potential for bilateral C5 and C6 nerve root compression.  PATIENT SURVEYS:  NDI  23/50 = 46.0%, moderate disability LEFS 10/80 = 12.5%, 87.5% disability  COGNITION: Overall cognitive status: Within functional limits for tasks assessed     SENSATION: Light touch: Impaired  Intermittent numbness in R thumb and B hands, burning and tingling in L shoulder and UE  MUSCLE LENGTH: Hamstrings: mild/mod tight L>R ITB: mod tight B Piriformis: mod/severe tight L, mod tight R Hip flexors: mod tight L>R Quads: mod tight  L>R  POSTURE:  No Significant postural limitations and cervical collar in place  CERVICAL ROM:   Active ROM AROM (deg) 09/11/22  Flexion 32  Extension 44  Right lateral flexion 23  Left lateral flexion 13  Right rotation 52  Left rotation 51   (Blank rows = not tested)   UPPER EXTREMITY ROM: Deferred on eval d/t pt reported post-op precautions  Active ROM Right 09/11/22 Left 09/11/22 Right 09/26/22 Left 09/26/22  Shoulder flexion 131 139 124 124  Shoulder extension 36 39 38 32  Shoulder abduction 157 151 104 105  Shoulder adduction      Shoulder internal rotation FIR to L2 FIR to L1    Shoulder external rotation FER - unable to reach behind FER - unable to reach behind    Elbow flexion      Elbow extension      Wrist flexion      Wrist extension      Wrist ulnar deviation      Wrist radial deviation      Wrist pronation      Wrist supination      (  Blank rows = not tested)  UPPER EXTREMITY MMT:  Deferred on eval d/t pt reported post-op precautions  MMT Right 09/11/22 Left 09/11/22 Right 09/26/22 Left 09/26/22  Shoulder flexion 4+ 4+ 4+ 4+  Shoulder extension 4 4- 4+ 4+  Shoulder abduction 4+ 4+ 4+ 4+  Shoulder adduction      Shoulder internal rotation 4+ 4+ 4+ 4+  Shoulder external rotation _0 Middle trapezius      Lower trapezius      Elbow flexion 5 4+ 5 5  Elbow extension 4+ 4+ 4+ 4+  Wrist flexion      Wrist extension      Wrist ulnar deviation      Wrist radial deviation      Wrist pronation      Wrist supination      Grip strength (lbs) 75 68    (Blank rows = not tested)  LOWER EXTREMITY ROM: B hip ROM mild to moderately limited in all planes with greatest limitation in L hip flexion and IR/ER  LOWER EXTREMITY MMT: Tested in sitting on eval; *standard testing positions except extension tested in sidelying on 09/03/22  MMT Right eval Left eval Right 09/03/22 Left 09/03/22 Right 09/26/22 Left 09/26/22  Hip flexion 4+ 4 5 4+ 4+ 4  Hip  extension 4 4+ 4+ * 4 * 4- 4-  Hip abduction 4+ 4 4+ 4 4 seated 4- seated  Hip adduction 4+ 4+ 5 4+ 4+ seated 4- seated  Hip internal rotation 4 4 4+ 4+ 4 4  Hip external rotation 4 4 4+ 4+ 4 4  Knee flexion _1 4+ 4+ 4+  Knee extension 4+ 4+ _2 4+  Ankle dorsiflexion 4 4+ 4+ 4+ 5 5  Ankle plantarflexion   5 5- 5- 20 SLS HR (Limited lift) 4 13 SLS HR  Ankle inversion        Ankle eversion         (Blank rows = not tested)  BED MOBILITY:  NT  TRANSFERS: Assistive device utilized: None  Sit to stand: Modified independence Stand to sit: Modified independence Chair to chair:  NT Floor:  NT  GAIT: Distance walked: 60 ft Assistive device utilized: Single point cane Level of assistance: Modified independence Gait pattern: step through pattern, decreased stride length, decreased hip/knee flexion- Right, decreased hip/knee flexion- Left, lateral hip instability, and decreased trunk rotation Comments: 1.89 ft/sec gait speed = limited community ambulator  RAMP: Level of Assistance:  NT Assistive device utilized:  NT Ramp Comments:   CURB:  Level of Assistance:  NT Assistive device utilized:  NT Curb Comments:   STAIRS:  Level of Assistance: SBA  Stair Negotiation Technique: Step to Pattern with Single Rail on Right  Number of Stairs: 14   Height of Stairs: 7  Comments: leads with R LE  FUNCTIONAL TESTS:  5 times sit to stand: 16.63 sec; >15 sec = recurrent fall risk Timed up and go (TUG): 23.93 sec w/o AD, 17.03 sec with SPC; >13.5 sec = high fall risk 10 meter walk test: 17.38 sec with SPC - gait speed = 1.89 ft/sec; ,1.8 ft/sec = risk for recurrent falls Dynamic Gait Index: 12/24 - Scores of 19 or less are predictive of falls in older community living adults   TODAY'S TREATMENT:   10/29/22  Pt seen for aquatic therapy today.  Treatment took place in water 3.25-4.75 ft in depth at the [facility]. Temp of water  was 92.  Pt entered/exited the  pool via stairs step to pattern with hand rail.  * holding wall:  hip circles with knee flexed (CW/CCW);  hip ext x 10 x 2; hip abdct x 10  * walking forward/ backward with reciprocal arm swing  *Side lunges with yellow hand buoys x 4 widths, Ue add/abd *Plank on bench with hip leg lift and glute contraction x 10 *Plank on yellow noodle (against wall) - attempted to come off of wall; attempted with white barbell with pull downs x 5 (challenge) * wall push up/off x 10 * R/L hip flexor stretch at wall * 3 way leg stretch with thin square noodle as strap (ITB, hamstring, adductor)  * superman position with hands on yellow noodle x 30s    Pt requires the buoyancy and hydrostatic pressure of water for support, and to offload joints by unweighting joint load by at least 50 % in navel deep water and by at least 75-80% in chest to neck deep water.  Viscosity of the water is needed for resistance of strengthening. Water current perturbations provides challenge to standing balance requiring increased core activation.    10/17/22 THERAPEUTIC EXERCISE: to improve flexibility, strength and mobility.  Verbal and tactile cues throughout for technique. NuStep - L6 x 6 min (UE/LE) Seated R hip hinge + strap HS stretch 3 x 30-60" Hooklying R HS stretch with strap +/- DF for calf stretch 3 x 30" - pt noting better stretch  MANUAL THERAPY: To promote normalized muscle tension, improved flexibility, and reduced pain. Skilled palpation and monitoring of soft tissue during DN Trigger Point Dry-Needling  Treatment instructions: Expect mild to moderate muscle soreness. Patient verbalized understanding of these instructions and education. Patient Consent Given: Yes Education handout provided: Previously provided Muscles treated: R hamstring Electrical stimulation performed: No Parameters: N/A Treatment response/outcome: Twitch Response Elicited and Palpable Increase in Muscle Length STM/DTM, IASTM, manual TPR  and pin & stretch to muscles addressed with DN   10/14/22 Self care:   Detailed edu on MRI report; referral to [facility]; management of condition;   Pt seen for aquatic therapy today.  Treatment took place in water 3.25-4.5 ft in depth at the [facility]. Temp of water was 91.  Pt entered/exited the pool via stairs step to pattern with hand rail.   *Side lunges with yellow hand buoys x 4 widths, Ue add/abd *UE support on yellow hand buoys hip circles; df; df;    - add/abd; hip ext squats x 10 *bouncing BB off wall x5  min *4 ft heel walking then toe walking 2 widths ea *Cycling on noodle with hand buoys for " handlebars".     Pt requires the buoyancy and hydrostatic pressure of water for support, and to offload joints by unweighting joint load by at least 50 % in navel deep water and by at least 75-80% in chest to neck deep water.  Viscosity of the water is needed for resistance of strengthening. Water current perturbations provides challenge to standing balance requiring increased core activation.   10/10/22 THERAPEUTIC EXERCISE: to improve flexibility, strength and mobility.  Verbal and tactile cues throughout for technique. UBE L3 3 min fwd/ 3 min back Treadmill 1.67mh 5 min Rec Bike L3x434m Star excursion 1/2 circle pattern 3x bil Standing lat pull + march GTB x 10  Pallof press with GTB x 10 bil    PATIENT EDUCATION:  Education details: aquatics progressions and modifications Person educated: Patient Education method: ExElectronics engineer  comprehension: verbalized understanding  HOME EXERCISE PROGRAM: Access Code: [email or web address] URL: [email or web address] Date: 09/11/2022 Prepared by: Annie Paras  Exercises - Seated Table Hamstring Stretch  - 2 x daily - 7 x weekly - 2 sets - 30 sec hold - Supine Figure 4 Piriformis Stretch  - 2 x daily - 7 x weekly - 2 sets - 30 sec hold - Supine Piriformis Stretch with Foot on Ground  - 2 x daily - 7 x weekly -  2 sets - 30 sec hold - Sit to Stand  - 1 x daily - 3 x weekly - 3 sets - 10 reps - Standing Hip Abduction with Resistance at Ankles and Counter Support  - 1 x daily - 3 x weekly - 2 sets - 10 reps - 3 sec hold - Standing Hip Extension with Resistance at Ankles and Counter Support  - 1 x daily - 3 x weekly - 2 sets - 10 reps - 3 sec hold - Marching with Resistance  - 1 x daily - 3 x weekly - 2 sets - 10 reps - 3 sec hold - Standing Hamstring Curl with Resistance  - 1 x daily - 3 x weekly - 2 sets - 10 reps - 3 sec hold - Standing Shoulder Row with Anchored Resistance  - 1 x daily - 3-4 x weekly - 2 sets - 10 reps - 5 sec hold - Scapular Retraction with Resistance Advanced  - 1 x daily - 3-4 x weekly - 2 sets - 10 reps - 5 sec hold - Shoulder External Rotation and Scapular Retraction with Resistance  - 1 x daily - 3 x weekly - 2 sets - 10 reps - 3-5 sec hold   ASSESSMENT:  CLINICAL IMPRESSION: Pt reported significant reduction/ elimination of pain by end of session.  He does not require any rest breaks.  Reported good stretch with use of noodle as strap.  He is challenged with attempts at plank on noodle away from wall. Pt has 2 visits left in authorization period; PT to assess goals during next 2 visits.     OBJECTIVE IMPAIRMENTS: Abnormal gait, decreased activity tolerance, decreased balance, decreased endurance, decreased mobility, difficulty walking, decreased strength, decreased safety awareness, increased fascial restrictions, impaired perceived functional ability, increased muscle spasms, impaired flexibility, impaired sensation, improper body mechanics, postural dysfunction, and pain.   ACTIVITY LIMITATIONS: carrying, lifting, bending, sitting, standing, squatting, sleeping, stairs, transfers, bed mobility, bathing, toileting, dressing, reach over head, hygiene/grooming, locomotion level, and caring for others  PARTICIPATION LIMITATIONS: meal prep, cleaning, laundry, driving, community  activity, and yard work  PERSONAL FACTORS: Fitness, Past/current experiences, Time since onset of injury/illness/exacerbation, Transportation, and 3+ comorbidities: ACDF C3-4, C4-5, C5-6 on 07/23/22; L hip pain - severe OA; arthiritis; BPH; anemia; hypothyroidism; MVR & TVR; thyroid disease; vertigo  are also affecting patient's functional outcome.   REHAB POTENTIAL: Good  CLINICAL DECISION MAKING: Evolving/moderate complexity  EVALUATION COMPLEXITY: Moderate   GOALS: Goals reviewed with patient? Yes  SHORT TERM GOALS: Target date: 09/09/2022   Patient will be independent with initial HEP. Baseline:  Goal status: MET  09/03/22  2.  Patient will improve 5x STS time to </= 15 seconds for improved efficiency and safety with transfers Baseline: 16.63 sec Goal status: MET  09/04/11 - 5xSTS = 14.50 sec; 09/26/22 - 12.84 sec  LONG TERM GOALS: Target date: 09/30/2022; extended to 11/07/22  Patient will be independent with advanced/ongoing HEP to improve outcomes and carryover.  Baseline:  Goal status:  IN PROGRESS  09/26/22 - met for current HEP  2.  Patient will demonstrate improved B LE strength to >/= 4+/5 for improved stability and ease of mobility. Baseline:  Goal status: IN PROGRESS  09/26/22 - Refer to above MMT table - strength fluctuating from last testing   3.  Patient will be able to ambulate 600' with LRAD and normal gait pattern across various surfaces to access community.  Baseline:  Goal status: IN PROGRESS - 10/10/22 pt ambulates w/o AD at home, does not walk in community w/o AD  4.  Patient will improve gait velocity to at least 2.62 ft/sec for improved gait efficiency and safety as a community ambulator. Baseline:  Goal status: MET  09/03/22 - Gait speed = 3.04 ft/sec with and w/o SPC; 09/26/22 - 2.79 ft/sec w/o AD, 2.78 ft/sec with SPC - gait w/o AD with wider BOS and mild ataxia  5. Patient will be able to ascend/descend stairs with 1 HR and reciprocal step pattern safely to  access home and community.  Baseline:  Goal status: MET  09/26/22  6.  Patient will report 20/80 on LEFS to demonstrate improved functional ability. Baseline: LEFS 10/80 = 12.5% (87.5% disability) Goal status: IN PROGRESS  7.  Patient will demonstrate decreased TUG time to </= 13.5 sec to decrease risk for falls with transitional mobility Baseline: 23.93 sec w/o AD, 17.03 sec with SPC Goal status: MET  09/03/22 - TUG = 12.94 sec w/o AD; 10.85 sec with SPC; 09/26/22 - 11.56 sec w/o AD, 10.44 sec with SPC  9.  Patient will demonstrate at least 19/24 on DGI to decrease risk of falls. Baseline: DGI = 12/24 Goal status: IN PROGRESS 09/26/22 - 13/24    PLAN: PT FREQUENCY: 2x/week  PT DURATION: 4-6 weeks  PLANNED INTERVENTIONS: Therapeutic exercises, Therapeutic activity, Neuromuscular re-education, Balance training, Gait training, Patient/Family education, Self Care, Joint mobilization, Stair training, DME instructions, Dry Needling, Electrical stimulation, Cryotherapy, Moist heat, Taping, Ultrasound, Ionotophoresis 69m/ml Dexamethasone, Manual therapy, and Re-evaluation  PLAN FOR NEXT SESSION: hip flexibility; LE strengthening; balance/gait stability; postural and UE strengthening; hybrid therapy with 1 visit each land- and aquatic-based per week.   JKerin Perna PTA 10/29/22 11:14 AM [facility] [address]swe[address] [place] [address]Phone: [phone]  Fax:  [phone]

## 2022-11-01 ENCOUNTER — Encounter: Payer: Self-pay | Admitting: Physical Therapy

## 2022-11-01 ENCOUNTER — Ambulatory Visit: Payer: BC Managed Care – PPO | Attending: Neurosurgery | Admitting: Physical Therapy

## 2022-11-01 DIAGNOSIS — R2681 Unsteadiness on feet: Secondary | ICD-10-CM | POA: Insufficient documentation

## 2022-11-01 DIAGNOSIS — M6281 Muscle weakness (generalized): Secondary | ICD-10-CM | POA: Insufficient documentation

## 2022-11-01 DIAGNOSIS — M4712 Other spondylosis with myelopathy, cervical region: Secondary | ICD-10-CM | POA: Diagnosis not present

## 2022-11-01 DIAGNOSIS — R2689 Other abnormalities of gait and mobility: Secondary | ICD-10-CM | POA: Insufficient documentation

## 2022-11-01 DIAGNOSIS — R262 Difficulty in walking, not elsewhere classified: Secondary | ICD-10-CM | POA: Diagnosis not present

## 2022-11-01 DIAGNOSIS — M25552 Pain in left hip: Secondary | ICD-10-CM | POA: Diagnosis not present

## 2022-11-01 NOTE — Therapy (Addendum)
OUTPATIENT PHYSICAL THERAPY TREATMENT / PROGRESS NOTE / DISCHARGE SUMMARY  Patient Name: Alex West MRN: [id number] DOB:10/27/1956, 66 y.o., male Today's Date: 11/01/2022  Progress Note  Reporting Period 09/26/2022 to 11/01/2022  See note below for Objective Data and Assessment of Progress/Goals.      PT End of Session - 11/01/22 1018     Visit Number 17    Date for PT Re-Evaluation 11/07/22    Authorization Type BC[employer] - VL: 60 (7 used earlier this year)    Progress Note Due on Visit 18   Recert on visit #8 - 03/0/09   PT Start Time 1018    PT Stop Time 1105    PT Time Calculation (min) 47 min    Activity Tolerance Patient tolerated treatment well    Behavior During Therapy WFL for tasks assessed/performed                   Past Medical History:  Diagnosis Date   Arthritis    BPH (benign prostatic hyperplasia)    Colon polyp    ED (erectile dysfunction)    Elevated liver enzymes    Gallstones    Hepatitis    History of anemia    Hypercholesterolemia    Hypothyroidism    Mild tricuspid regurgitation    Mitral valve regurgitation    Thyroid disease    Vertigo    Past Surgical History:  Procedure Laterality Date   ANTERIOR CERVICAL DECOMP/DISCECTOMY FUSION N/A 07/23/2022   Procedure: Anterior Cervical Discectomy and Fusion Cervical Three-Four/Four-Five/Five-Six;  Surgeon: Vallarie Mare, MD;  Location: [facility];  Service: Neurosurgery;  Laterality: N/A;  3C   FLEXIBLE SIGMOIDOSCOPY     Patient Active Problem List   Diagnosis Date Noted   Paresthesia 08/21/2022   Stenosis of cervical spine with myelopathy ([place]) 07/23/2022   Gait abnormality 06/18/2022   Cervical myelopathy ([place]) 06/17/2022   Weakness 06/06/2022   Neck pain 06/06/2022   OA (osteoarthritis) of hip 07/24/2021   Immunosuppressed status ([place]) 09/05/2014   Hepatitis, autoimmune ([place]) 09/05/2014    PCP: Lawerance Cruel, MD  REFERRING PROVIDER: Vallarie Mare,  MD  REFERRING DIAG: [phone] (ICD-10-CM) - Cervical spondylosis with myelopathy  THERAPY DIAG:  Other abnormalities of gait and mobility  Muscle weakness (generalized)  Difficulty in walking, not elsewhere classified  Unsteadiness on feet  Pain in left hip  RATIONALE FOR EVALUATION AND TREATMENT: Rehabilitation  ONSET DATE: 07/23/2022 - ACDF C3-4, C4-5, C5-6   NEXT MD VISIT:  11/01/22  SUBJECTIVE:   SUBJECTIVE STATEMENT: Pt still feels tension in his HS which feels like it changes his walking pattern. His latest massage didn't seem to help and aggravated his L hip.  PAIN:  Are you having pain? No  PERTINENT HISTORY: ACDF C3-4, C4-5, C5-6 on 07/23/22; L hip pain - severe OA; arthiritis; BPH; anemia; hypothyroidism; MVR & TVR; thyroid disease; vertigo  PRECAUTIONS: None   WEIGHT BEARING RESTRICTIONS: No  FALLS:  Has patient fallen in last 6 months? No  LIVING ENVIRONMENT: Lives with: lives with their spouse Lives in: House/apartment Stairs: Yes: Internal: 14 steps; on right going up and External: 3 steps; on left going up Has following equipment at home: Single point cane and Walker - 2 wheeled  OCCUPATION: Retired  PLOF: Independent and Leisure: play golf, gym 5x/wk   PATIENT GOALS: "I want to get back to walking normally again."   OBJECTIVE:   DIAGNOSTIC FINDINGS:  09/18/22 - Cervical MRI: IMPRESSION: MRI  cervical spine (without) demonstrating: - Anterior cervical discectomy and fusion with metal hardware spanning C3, C4, C5 and C6 levels. - Moderate spinal stenosis at C3-4, C4-5 and C5-6 levels.  Mild myelomalacia noted at C4 level.  20 - Multilevel foraminal stenosis spanning C2-3 down to C7-T1 levels as above. - Compared to MRI from 06/13/2022 there have been interval surgical changes as above. Spinal stenosis has slightly improved from prior study. 09/18/22 - Thoracic MRI: IMPRESSION: MRI thoracic spine without contrast demonstrating: - At T10-11 broad disc  bulging, facet and ligamentum flavum hypertrophy with mild spinal stenosis and mild bilateral foraminal stenosis; subtle increased T2 signal within the spinal cord at T10 level (series 12 image 28, 29) may reflect mild spinal cord edema, syringomyelia or artifact. 09/18/22 - Lumbar MRI: IMPRESSION: MRI lumbar spine (without) demonstrating: - At L3-4: Disc bulging and facet vertebrae with moderate spinal stenosis and mild bilateral foraminal stenosis. - At L4-5: Disc bulging and facet hypertrophy with moderate spinal stenosis and mild bilateral foraminal stenosis. - At L5-S1: Disc bulging and facet hypertrophy with moderate right and mild left foraminal stenosis. - Lumbarization of S1 segment with transitional features.  Lowest visualized intervertebral disc level is labeled S1-2 and the study.  Note enumeration scheme if intervention is planned.  06/13/22 - Cervical MRI:  IMPRESSION: This MRI of the cervical spine without contrast shows the following: Increase signal within the spinal cord adjacent to C3-C4 consistent with compressive myelopathy. At C3-C4 and C5-C6 there is severe spinal stenosis, at C4-C5 there is moderate spinal stenosis and at C2-C3, C6-C7 and C7-T1 there is mild spinal stenosis. There are various degrees of foraminal narrowing at every level in the cervical spine.  This is severe at C3-C4 where there is probable bilateral C4 nerve root compression, and moderately severe at C4-C5 and C5-C6 where there is potential for bilateral C5 and C6 nerve root compression.  PATIENT SURVEYS:  NDI  23/50 = 46.0%, moderate disability LEFS 10/80 = 12.5%, 87.5% disability  COGNITION: Overall cognitive status: Within functional limits for tasks assessed     SENSATION: Light touch: Impaired  Intermittent numbness in R thumb and B hands, burning and tingling in L shoulder and UE  MUSCLE LENGTH: Hamstrings: mild/mod tight L>R ITB: mod tight B Piriformis: mod/severe tight L, mod tight R Hip  flexors: mod tight L>R Quads: mod tight L>R  POSTURE:  No Significant postural limitations and cervical collar in place  CERVICAL ROM:   Active ROM AROM (deg) 09/11/22   11/01/22  Flexion 32 32  Extension 44 38  Right lateral flexion 23 22  Left lateral flexion 13 18  Right rotation 52 57  Left rotation 51 50   (Blank rows = not tested)   UPPER EXTREMITY ROM: Deferred on eval d/t pt reported post-op precautions  Active ROM Right 09/11/22 Left 09/11/22 Right 09/26/22 Left 09/26/22 Right 11/01/22 Left 11/01/22  Shoulder flexion 131 139 124 124 135 141  Shoulder extension 36 39 38 32    Shoulder abduction 157 151 104 105 138 132  Shoulder adduction        Shoulder internal rotation FIR to L2 FIR to L1   FIR to T11 FIR to T9  Shoulder external rotation FER - unable to reach behind FER - unable to reach behind   FER to C7 FER to C6  Elbow flexion        Elbow extension        Wrist flexion  Wrist extension        Wrist ulnar deviation        Wrist radial deviation        Wrist pronation        Wrist supination        (Blank rows = not tested)  UPPER EXTREMITY MMT:  Deferred on eval d/t pt reported post-op precautions  MMT Right 09/11/22 Left 09/11/22 Right 09/26/22 Left 09/26/22 Right 11/01/22 Left 11/01/22  Shoulder flexion 4+ 4+ 4+ 4+ 5 5  Shoulder extension 4 4- 4+ 4+ 5 5  Shoulder abduction 4+ 4+ 4+ 4+ 5 5  Shoulder adduction        Shoulder internal rotation 4+ 4+ 4+ 4+ 5 5  Shoulder external rotation _0 4+  Middle trapezius        Lower trapezius        Elbow flexion 5 4+ _1 Elbow extension 4+ 4+ 4+ 4+ 5 5  Wrist flexion        Wrist extension        Wrist ulnar deviation        Wrist radial deviation        Wrist pronation        Wrist supination        Grip strength (lbs) 75 68   87.3 67  (Blank rows = not tested)  LOWER EXTREMITY ROM: B hip ROM mild to moderately limited in all planes with greatest limitation in L hip flexion  and IR/ER  LOWER EXTREMITY MMT: Tested in sitting on eval; *standard testing positions except extension tested in sidelying on 09/03/22  MMT Right eval Left eval Right 09/03/22 Left 09/03/22 Right 09/26/22 Left 09/26/22 Right 11/01/22 Left  11/01/22  Hip flexion 4+ 4 5 4+ 4+ 4 4+ 4+  Hip extension 4 4+ 4+ * 4 * 4- 4- 4+ seated 4+ seated  Hip abduction 4+ 4 4+ 4 4 seated 4- seated 5 seated 5 seated  Hip adduction 4+ 4+ 5 4+ 4+ seated 4- seated 5 seated 5 seated  Hip internal rotation 4 4 4+ 4+ 4 4 4+ 4+  Hip external rotation 4 4 4+ 4+ _2 Knee flexion _3 4+ 4+ 4+ 5 5  Knee extension 4+ 4+ _4 4+ 5 5  Ankle dorsiflexion 4 4+ 4+ 4+ _5 Ankle plantarflexion   5 5- 5- 20 SLS HR (Limited lift) 4 13 SLS HR 5- 20 SLS HR (Limited lift) 4 11 SLS HR (limited by hip)  Ankle inversion          Ankle eversion           (Blank rows = not tested)  BED MOBILITY:  NT  TRANSFERS: Assistive device utilized: None  Sit to stand: Modified independence Stand to sit: Modified independence Chair to chair:  NT Floor:  NT  GAIT: Distance walked: 60 ft Assistive device utilized: Single point cane Level of assistance: Modified independence Gait pattern: step through pattern, decreased stride length, decreased hip/knee flexion- Right, decreased hip/knee flexion- Left, lateral hip instability, and decreased trunk rotation Comments: 1.89 ft/sec gait speed = limited community ambulator  RAMP: Level of Assistance:  NT Assistive device utilized:  NT Ramp Comments:   CURB:  Level of Assistance:  NT Assistive device utilized:  NT Curb Comments:   STAIRS:  Level of Assistance: SBA  Stair Negotiation Technique: Step to  Pattern with Single Rail on Right  Number of Stairs: 14   Height of Stairs: 7  Comments: leads with R LE  FUNCTIONAL TESTS:  5 times sit to stand: 16.63 sec; >15 sec = recurrent fall risk Timed up and go (TUG): 23.93 sec w/o AD, 17.03 sec with SPC;  >13.5 sec = high fall risk 10 meter walk test: 17.38 sec with SPC - gait speed = 1.89 ft/sec; ,1.8 ft/sec = risk for recurrent falls Dynamic Gait Index: 12/24 - Scores of 19 or less are predictive of falls in older community living adults   TODAY'S TREATMENT:   11/01/22 THERAPEUTIC EXERCISE: to improve flexibility, strength and mobility.  Verbal and tactile cues throughout for technique. NuStep - L6 x 8 min (UE/LE) BATCA HS curls  B LE con/ecc 35# 2 x 10 B concentric/single LE eccentric 25# 2 x 10 bil  THERAPEUTIC ACTIVITIES: Cervical & UE ROM UE/LE MMT Goal assessment   10/29/22 Pt seen for aquatic therapy today.  Treatment took place in water 3.25-4.75 ft in depth at the [facility]. Temp of water was 92.  Pt entered/exited the pool via stairs step to pattern with hand rail.  * holding wall:  hip circles with knee flexed (CW/CCW);  hip ext x 10 x 2; hip abdct x 10  * walking forward/ backward with reciprocal arm swing  *Side lunges with yellow hand buoys x 4 widths, Ue add/abd *Plank on bench with hip leg lift and glute contraction x 10 *Plank on yellow noodle (against wall) - attempted to come off of wall; attempted with white barbell with pull downs x 5 (challenge) * wall push up/off x 10 * R/L hip flexor stretch at wall * 3 way leg stretch with thin square noodle as strap (ITB, hamstring, adductor)  * superman position with hands on yellow noodle x 30s  Pt requires the buoyancy and hydrostatic pressure of water for support, and to offload joints by unweighting joint load by at least 50 % in navel deep water and by at least 75-80% in chest to neck deep water.  Viscosity of the water is needed for resistance of strengthening. Water current perturbations provides challenge to standing balance requiring increased core activation.     10/17/22 THERAPEUTIC EXERCISE: to improve flexibility, strength and mobility.  Verbal and tactile cues throughout for  technique. NuStep - L6 x 6 min (UE/LE) Seated R hip hinge + strap HS stretch 3 x 30-60" Hooklying R HS stretch with strap +/- DF for calf stretch 3 x 30" - pt noting better stretch  MANUAL THERAPY: To promote normalized muscle tension, improved flexibility, and reduced pain. Skilled palpation and monitoring of soft tissue during DN Trigger Point Dry-Needling  Treatment instructions: Expect mild to moderate muscle soreness. Patient verbalized understanding of these instructions and education. Patient Consent Given: Yes Education handout provided: Previously provided Muscles treated: R hamstring Electrical stimulation performed: No Parameters: N/A Treatment response/outcome: Twitch Response Elicited and Palpable Increase in Muscle Length STM/DTM, IASTM, manual TPR and pin & stretch to muscles addressed with DN   PATIENT EDUCATION:  Education details: need for further assessment of balance related goals, progress with PT, and likely readiness for discharge from physical therapy next week with recommended use of [employer] gym and/or pool for continued strengthening and aquatic exercises Person educated: Patient Education method: Explanation Education comprehension: verbalized understanding  HOME EXERCISE PROGRAM: Access Code: [email or web address] URL: [email or web address] Date: 09/11/2022 Prepared by: Annie Paras  Exercises - Seated Table Hamstring Stretch  -  2 x daily - 7 x weekly - 2 sets - 30 sec hold - Supine Figure 4 Piriformis Stretch  - 2 x daily - 7 x weekly - 2 sets - 30 sec hold - Supine Piriformis Stretch with Foot on Ground  - 2 x daily - 7 x weekly - 2 sets - 30 sec hold - Sit to Stand  - 1 x daily - 3 x weekly - 3 sets - 10 reps - Standing Hip Abduction with Resistance at Ankles and Counter Support  - 1 x daily - 3 x weekly - 2 sets - 10 reps - 3 sec hold - Standing Hip Extension with Resistance at Ankles and Counter Support  - 1 x daily - 3 x weekly - 2 sets - 10  reps - 3 sec hold - Marching with Resistance  - 1 x daily - 3 x weekly - 2 sets - 10 reps - 3 sec hold - Standing Hamstring Curl with Resistance  - 1 x daily - 3 x weekly - 2 sets - 10 reps - 3 sec hold - Standing Shoulder Row with Anchored Resistance  - 1 x daily - 3-4 x weekly - 2 sets - 10 reps - 5 sec hold - Scapular Retraction with Resistance Advanced  - 1 x daily - 3-4 x weekly - 2 sets - 10 reps - 5 sec hold - Shoulder External Rotation and Scapular Retraction with Resistance  - 1 x daily - 3 x weekly - 2 sets - 10 reps - 3-5 sec hold   ASSESSMENT:  CLINICAL IMPRESSION: Lynnae Sandhoff "[name]" has demonstrated good progress with physical therapy following ACDF.  He demonstrates functional cervical (within limitations of fusion) and bilateral UE ROM with overall UE strength grossly 5/5 and good grip strength bilaterally.  Bilateral LE strength also significantly improved with overall strength 4+ to 5/5, although mobility is still limited by advanced left hip OA (patient hopeful to have THA in January).  He continues to ambulate with a SPC due to hip arthritis with gait pattern also affected by the arthritis.  Majority of strength and functional goals are now met or partially met, with balance related goals to be assessed next visit.  Given current progress with physical therapy, anticipate patient should be ready to transition to HEP upon completion of goal assessment next visit unless additional concerns identified at his follow-up visit with the surgeon next week.  We have discussed options for continued gym-based strengthening as well as aquatic-based exercises for him to continue with upon completion of physical therapy, and will complete final review and update of HEP next visit.  OBJECTIVE IMPAIRMENTS: Abnormal gait, decreased activity tolerance, decreased balance, decreased endurance, decreased mobility, difficulty walking, decreased strength, decreased safety awareness, increased fascial  restrictions, impaired perceived functional ability, increased muscle spasms, impaired flexibility, impaired sensation, improper body mechanics, postural dysfunction, and pain.   ACTIVITY LIMITATIONS: carrying, lifting, bending, sitting, standing, squatting, sleeping, stairs, transfers, bed mobility, bathing, toileting, dressing, reach over head, hygiene/grooming, locomotion level, and caring for others  PARTICIPATION LIMITATIONS: meal prep, cleaning, laundry, driving, community activity, and yard work  PERSONAL FACTORS: Fitness, Past/current experiences, Time since onset of injury/illness/exacerbation, Transportation, and 3+ comorbidities: ACDF C3-4, C4-5, C5-6 on 07/23/22; L hip pain - severe OA; arthiritis; BPH; anemia; hypothyroidism; MVR & TVR; thyroid disease; vertigo  are also affecting patient's functional outcome.   REHAB POTENTIAL: Good  CLINICAL DECISION MAKING: Evolving/moderate complexity  EVALUATION COMPLEXITY: Moderate   GOALS: Goals reviewed with  patient? Yes  SHORT TERM GOALS: Target date: 09/09/2022   Patient will be independent with initial HEP. Baseline:  Goal status: MET  09/03/22  2.  Patient will improve 5x STS time to </= 15 seconds for improved efficiency and safety with transfers Baseline: 16.63 sec Goal status: MET  09/04/11 - 5xSTS = 14.50 sec; 09/26/22 - 12.84 sec  LONG TERM GOALS: Target date: 09/30/2022; extended to 11/07/22  Patient will be independent with advanced/ongoing HEP to improve outcomes and carryover.  Baseline:  Goal status: IN PROGRESS  09/26/22 - met for current HEP  2.  Patient will demonstrate improved B LE strength to >/= 4+/5 for improved stability and ease of mobility. Baseline:  Goal status: PARTIALLY MET  11/01/22 - Refer to above MMT table - MET except L ankle PF (limited SLS tolerance d/t severe L hip OA)  3.  Patient will be able to ambulate 600' with LRAD and normal gait pattern across various surfaces to access community.   Baseline:  Goal status: PARTIALLY MET  11/01/22 - Pt ambulates w/o AD in home, but continues to use SPC when walking in community - gait pattern and tolerance limited by severe L hip OA in need of THA  4.  Patient will improve gait velocity to at least 2.62 ft/sec for improved gait efficiency and safety as a community ambulator. Baseline:  Goal status: MET  09/03/22 - Gait speed = 3.04 ft/sec with and w/o SPC; 09/26/22 - 2.79 ft/sec w/o AD, 2.78 ft/sec with SPC - gait w/o AD with wider BOS and mild ataxia  5. Patient will be able to ascend/descend stairs with 1 HR and reciprocal step pattern safely to access home and community.  Baseline:  Goal status: MET  09/26/22  6.  Patient will report 20/80 on LEFS to demonstrate improved functional ability. Baseline: LEFS 10/80 = 12.5% (87.5% disability) Goal status: IN PROGRESS  7.  Patient will demonstrate decreased TUG time to </= 13.5 sec to decrease risk for falls with transitional mobility Baseline: 23.93 sec w/o AD, 17.03 sec with SPC Goal status: MET  09/03/22 - TUG = 12.94 sec w/o AD; 10.85 sec with SPC; 09/26/22 - 11.56 sec w/o AD, 10.44 sec with SPC  9.  Patient will demonstrate at least 19/24 on DGI to decrease risk of falls. Baseline: DGI = 12/24 Goal status: IN PROGRESS 09/26/22 - 13/24    PLAN: PT FREQUENCY: 2x/week  PT DURATION: 4-6 weeks  PLANNED INTERVENTIONS: Therapeutic exercises, Therapeutic activity, Neuromuscular re-education, Balance training, Gait training, Patient/Family education, Self Care, Joint mobilization, Stair training, DME instructions, Dry Needling, Electrical stimulation, Cryotherapy, Moist heat, Taping, Ultrasound, Ionotophoresis 77m/ml Dexamethasone, Manual therapy, and Re-evaluation  PLAN FOR NEXT SESSION: Complete patient outcome surveys and balance assessment for completion of goal assessment; review and update/consolidate HEP; anticipate transition to HEP +/- 30-day hold versus discharge from physical  therapy   JPercival Spanish PT, MPT 11/01/22, 1:06 PM  [facility] [address] [place] [address]Phone: [phone]  Fax:  [phone]  PHYSICAL THERAPY DISCHARGE SUMMARY  Visits from Start of Care: 17  Current functional level related to goals / functional outcomes:   Refer to above clinical impression and goal assessment for status as of last visit on 11/01/22. Plan was for patient to transition to his HEP and gym program following completion of his goal assess and HEP review next visit, but he has not returned in >30  days, therefore will proceed with discharge from PT for this episode.     Remaining deficits:   As above.   Education / Equipment:   HEP   Patient agrees to discharge. Patient goals were partially met. Patient is being discharged due to not returning since the last visit.  Percival Spanish, PT, MPT 01/14/23, 2:16 PM  [facility] [address]  [place], [place], [address] Phone: [phone]   Fax:  [phone]

## 2022-11-06 ENCOUNTER — Ambulatory Visit: Payer: BC Managed Care – PPO | Admitting: Family Medicine

## 2022-11-06 ENCOUNTER — Ambulatory Visit: Payer: BC Managed Care – PPO | Admitting: Physical Therapy

## 2022-11-11 ENCOUNTER — Ambulatory Visit (INDEPENDENT_AMBULATORY_CARE_PROVIDER_SITE_OTHER): Payer: BC Managed Care – PPO | Admitting: Family Medicine

## 2022-11-11 VITALS — BP 100/70 | Ht 75.0 in | Wt 240.0 lb

## 2022-11-11 DIAGNOSIS — M1612 Unilateral primary osteoarthritis, left hip: Secondary | ICD-10-CM | POA: Diagnosis not present

## 2022-11-11 NOTE — Progress Notes (Signed)
  Alex West - 66 y.o. male MRN [id number]  Date of birth: 07/22/1956  SUBJECTIVE:  Including CC & ROS.  No chief complaint on file.   Alex West is a 66 y.o. male that is  following up for his left hip pain. Has known arthritis of his left hip. Has tried PT, injections and medications.   Review of Systems See HPI   HISTORY: Past Medical, Surgical, Social, and Family History Reviewed & Updated per EMR.   Pertinent Historical Findings include:  Past Medical History:  Diagnosis Date   Arthritis    BPH (benign prostatic hyperplasia)    Colon polyp    ED (erectile dysfunction)    Elevated liver enzymes    Gallstones    Hepatitis    History of anemia    Hypercholesterolemia    Hypothyroidism    Mild tricuspid regurgitation    Mitral valve regurgitation    Thyroid disease    Vertigo     Past Surgical History:  Procedure Laterality Date   ANTERIOR CERVICAL DECOMP/DISCECTOMY FUSION N/A 07/23/2022   Procedure: Anterior Cervical Discectomy and Fusion Cervical Three-Four/Four-Five/Five-Six;  Surgeon: Vallarie Mare, MD;  Location: [facility];  Service: Neurosurgery;  Laterality: N/A;  3C   FLEXIBLE SIGMOIDOSCOPY       PHYSICAL EXAM:  VS: BP 100/70   Ht '6\' 3"'$  (1.905 m)   Wt 240 lb (108.9 kg)   BMI 30.00 kg/m  Physical Exam Gen: NAD, alert, cooperative with exam, well-appearing MSK:  Neurovascularly intact       ASSESSMENT & PLAN:   OA (osteoarthritis) of hip Acute on chronic in nature. Has failed conservative therapy.  - counseled on supportive care - referral to surgeon

## 2022-11-11 NOTE — Assessment & Plan Note (Signed)
Acute on chronic in nature. Has failed conservative therapy.  - counseled on supportive care - referral to surgeon

## 2022-11-11 NOTE — Patient Instructions (Signed)
Good to see you I have made a referral to the surgeon   Please send me a message in [place] with any questions or updates.  Please see me back as needed.   --Dr. Raeford Razor

## 2022-12-03 DIAGNOSIS — M1612 Unilateral primary osteoarthritis, left hip: Secondary | ICD-10-CM | POA: Diagnosis not present

## 2022-12-06 DIAGNOSIS — G988 Other disorders of nervous system: Secondary | ICD-10-CM | POA: Diagnosis not present

## 2022-12-06 DIAGNOSIS — R531 Weakness: Secondary | ICD-10-CM | POA: Diagnosis not present

## 2022-12-18 ENCOUNTER — Ambulatory Visit (INDEPENDENT_AMBULATORY_CARE_PROVIDER_SITE_OTHER): Payer: BC Managed Care – PPO | Admitting: Neurology

## 2022-12-18 ENCOUNTER — Encounter: Payer: Self-pay | Admitting: Neurology

## 2022-12-18 ENCOUNTER — Ambulatory Visit: Payer: BC Managed Care – PPO | Admitting: Neurology

## 2022-12-18 VITALS — BP 136/83 | HR 72 | Ht 75.0 in | Wt 240.0 lb

## 2022-12-18 DIAGNOSIS — G959 Disease of spinal cord, unspecified: Secondary | ICD-10-CM

## 2022-12-18 DIAGNOSIS — R531 Weakness: Secondary | ICD-10-CM | POA: Diagnosis not present

## 2022-12-18 DIAGNOSIS — R269 Unspecified abnormalities of gait and mobility: Secondary | ICD-10-CM | POA: Diagnosis not present

## 2022-12-18 DIAGNOSIS — R202 Paresthesia of skin: Secondary | ICD-10-CM

## 2022-12-18 NOTE — Progress Notes (Signed)
Chief Complaint  Patient presents with   Procedure    Rm EMG/NCV 4.       ASSESSMENT AND PLAN  Alex West is a 66 y.o. male  Cervical stenosis  Presurgically he has severe stenosis at C3-4, C5-6, moderate at C4-5 level, evidence of cord signal abnormality,  Status post anterior C3-6 decompression fusion on July 23, 2022, postsurgically, complains of worsening gait abnormality, upper extremity paresthesia, weakness, Postsurgical MRI of cervical spine showed moderate cervical spinal stenosis C3-6 level, myelomalacia at C4, only slight improvement compared to presurgical MRI cervical spine  Lumbar moderate stenosis at L3-4, L4-5 level Paresthesia of bilateral upper extremity  Gabapentin 300 mg up to 3 times a day as needed  EMG nerve conduction study today showed chronic right C5-6, 7 radiculopathy, mild chronic L4-5 S1 lumbar radiculopathy, there is no evidence of upper extremity focal neuropathy to explain his persistent upper extremity paresthesia,  His gait abnormality unlikely due to residual cervical myelopathic changes, evidence of continued stenosis, and myelomalacia on postsurgical MRI cervical spine   We will refer him to [place] neurosurgeon for second opinion   DIAGNOSTIC DATA (LABS, IMAGING, TESTING) - I reviewed patient records, labs, notes, testing and imaging myself where available.   MEDICAL HISTORY:  Alex West is a 66 year old male, seen in request by his primary care doctor Lawerance Cruel, for evaluation of bilateral arm numbness, weakness right worse than left  I reviewed and summarized the referring note.PMHX Hypothyroidism Hyperlipidemia Mitral valve regurgitation by echo Elevated liver enzymes in December 2013, ultrasound showed fatty infiltration of liver, with positive ANA, positive actin and antimitochondrial chondral antibody, negative hepatitis ABC, normal ferritin, was seen by [place] hepatologist, liver biopsy in March 2014,  chronic inflammation consistent with autoimmune hepatitis, good response to budesonide, transition to Imuran September 2015, he is currently taking Imuran 50 mg 1 and half tablets daily  Since the beginning of 2023, he noticed bilateral arm numbness, initially had neck pain radiating pain to left shoulder, now achiness sensation, bilateral shoulder, upper extremity, persistent numbness of right thumb, left fourth and fifth fingers,  He also noticed the weakness, he used to workout heavy lifting, now is really limited, it is even difficult for him to open jars,  He denies bowel and bladder incontinence, he noticed gait abnormality, but contributed to his severe left hip pain, MRI of left hip in January 23 showed severe osteoarthritis of left hip, with moderate effusion, degeneration of lateral labrum with laceration of the anterior labrum  Update June 18, 2022: He is accompanied by his wife at today's visit, reported bilateral arm weakness, wasting of the muscles, gait abnormality, moderate left hip pain,  We personally reviewed MRI cervical spine on June 13, 2022, severe spinal stenosis at C3-4, moderate C4-5, with evidence of myelomalacia adjacent to C3-4, variable degree of foraminal narrowing at multiple levels  He does need cervical decompression to prevent further worsening of his cervical myelopathy symptoms  UPDATE August 21 2022: He underwent anterior C3-4, C4-5 C5-6 cervical decompression by Dr. Claudette Head on July 23, 2022, he still wear his hard cervical collar, no longer have significant neck pain  But he is very concerned about persistent bilateral upper extremity paresthesia, weakness, also gait abnormality, but is making progress, now progress from walker to cane, sometimes walk independently at home  He also complains of urinary urgency, constipation, quite frustrated that he is not making the progress like what he is expected  Again personally reviewed  presurgical MRI  cervical spine, severe spinal stenosis at C3-4, C 5 6 level, C4-5 level, there is also evidence of cord signal abnormality  UPDATE Oct 10 2022: He is alone at today's clinical visit, continues to feel frustrated about his gait abnormality, spasm of bilateral hamstring, bilateral upper extremities weakness, upper extremity neuropathic pain, numbness, taking gabapentin as needed, worry about the side effect of Cymbalta, did not try,  Is trying physical therapy twice a week, try to walk daily,  He also complains of chronic constipation, managed by large glass of juice, fiber, occasionally laxative, frequent awakening at nighttime for urinary frequency, bladder spasms  Personally reviewed postsurgical repeat MRI cervical spine September 22, 2022 -Anterior cervical discectomy and fusion with metal hardware spanning C3, C4, C5 and C6 levels. -Moderate spinal stenosis at C3-4, C4-5 and C5-6 levels.  Mild myelomalacia noted at C4 level.  20 -Multilevel foraminal stenosis spanning C2-3 down to C7-T1 levels as above. -Compared to MRI from 06/13/2022 there have been interval surgical changes as above. Spinal stenosis has slightly improved from prior study.   MRI thoracic spine without contrast demonstrating: - At T10-11 broad disc bulging, facet and ligamentum flavum hypertrophy with mild spinal stenosis and mild bilateral foraminal stenosis; subtle increased T2 signal within the spinal cord at T10 level (series 12 image 28, 29) may reflect mild spinal cord edema, syringomyelia or artifact.    MRI lumbar spine (without) demonstrating: - At L3-4: Disc bulging and facet vertebrae with moderate spinal stenosis and mild bilateral foraminal stenosis. - At L4-5: Disc bulging and facet hypertrophy with moderate spinal stenosis and mild bilateral foraminal stenosis. - At L5-S1: Disc bulging and facet hypertrophy with moderate right and mild left foraminal stenosis. - Lumbarization of S1 segment with transitional  features.  Lowest visualized intervertebral disc level is labeled S1-2 and the study.  Note enumeration scheme if intervention is planned.  Reviewed neurosurgical evaluation by Dr. Miki Kins September 06, 2022, severe cervical stenosis myelomalacia status post C3-6 ACDF, with continued myelopathic neuropathic pain, per patient, he was given the option of posterior approach cervical decompression surgery again, but he is very hesitant to go through second neck surgery, very frustrated about his symptoms  Update December 18, 2022: Patient return for electrodiagnostic study today, continue complains of persistent upper extremity paresthesia, sometimes painful, most disappointing symptoms is persistent gait abnormality,  EMG nerve conduction study today showed chronic right C5-6, 7 radiculopathy, mild chronic L4-5 S1 lumbar radiculopathy, there is no evidence of upper extremity focal neuropathy to explain his persistent upper extremity paresthesia,   His gait abnormality unlikely due to residual cervical myelopathic changes, evidence of continued stenosis, and myelomalacia on postsurgical MRI cervical spine    He was seen by Dr. Claudette Head, offered him second cervical decompression surgery, he wants second opinion  PHYSICAL EXAM:  Today's Vitals   12/18/22 0820  BP: 136/83  Pulse: 72  Weight: 240 lb (108.9 kg)  Height: '6\' 3"'$  (1.905 m)   Body mass index is 30 kg/m.   PHYSICAL EXAMNIATION:  Gen: NAD, conversant, well nourised, well groomed                     Cardiovascular: Regular rate rhythm, no peripheral edema, warm, nontender. Eyes: Conjunctivae clear without exudates or hemorrhage Neck: Supple, no carotid bruits. Pulmonary: Clear to auscultation bilaterally   NEUROLOGICAL EXAM:  MENTAL STATUS: Speech/cognition: Awake, alert, oriented to history taking and casual conversation CRANIAL NERVES: CN II: Visual fields are full  to confrontation. Pupils are round equal and  briskly reactive to light. CN III, IV, VI: extraocular movement are normal. No ptosis. CN V: Facial sensation is intact to light touch CN VII: Face is symmetric with normal eye closure  CN VIII: Hearing is normal to causal conversation. CN IX, X: Phonation is normal. CN XI: Shoulder shrug is symmetric, in rigid cervical collar   MOTOR:  UE Shoulder Abduction Shoulder External Rotation Elbow Flexion Elbow  Extension Wrist Flexion Wrist Extension Grip  R 5- 5- 5- '5 5 5 5  '$ L '4  4 4 5 5 5 5   '$ LE Hip Flexion Knee flexion Knee extension Ankle Dorsiflexion Eversion Ankle plantar Flexion Inversion  R 5- 5 5- '5 5 5 5  '$ L 4 5 5- '5 5 5 5   '$ -  REFLEXES: Reflexes (R/L) biceps 2/2, triceps 2/2, knees 3/3 , and ankles 2/2 . Plantar responses are extensor bilaterally  SENSORY: Intact to light touch, -  COORDINATION: There is no trunk or limb dysmetria noted.  GAIT/STANCE:  push-up to get up from seated position, cautious, stiff, wide-based rely on his cane,  REVIEW OF SYSTEMS:  Full 14 system review of systems performed and notable only for as above All other review of systems were negative.   ALLERGIES: Allergies  Allergen Reactions   Tadalafil Other (See Comments)    Headache     HOME MEDICATIONS: Current Outpatient Medications  Medication Sig Dispense Refill   azaTHIOprine (IMURAN) 50 MG tablet Take 75 mg by mouth daily.     gabapentin (NEURONTIN) 300 MG capsule Take 1 capsule (300 mg total) by mouth 3 (three) times daily. (Patient taking differently: Take 300 mg by mouth daily as needed (pain).) 90 capsule 11   levothyroxine (SYNTHROID) 200 MCG tablet Take 200 mcg by mouth daily before breakfast.     sildenafil (VIAGRA) 100 MG tablet Take 100 mg by mouth as needed for erectile dysfunction.     DULoxetine (CYMBALTA) 60 MG capsule Take 60 mg by mouth daily. (Patient not taking: Reported on 10/10/2022)     oxybutynin (DITROPAN-XL) 5 MG 24 hr tablet Take 1 tablet (5 mg total) by  mouth at bedtime. (Patient not taking: Reported on 12/18/2022) 30 tablet 11   tadalafil (CIALIS) 5 MG tablet Take 5 mg by mouth daily as needed (BPH). (Patient not taking: Reported on 12/18/2022)     Current Facility-Administered Medications  Medication Dose Route Frequency Provider Last Rate Last Admin   acetaminophen (TYLENOL) tablet 975 mg  975 mg Oral Q4H PRN Copland, Gay Filler, MD        PAST MEDICAL HISTORY: Past Medical History:  Diagnosis Date   Arthritis    BPH (benign prostatic hyperplasia)    Colon polyp    ED (erectile dysfunction)    Elevated liver enzymes    Gallstones    Hepatitis    History of anemia    Hypercholesterolemia    Hypothyroidism    Mild tricuspid regurgitation    Mitral valve regurgitation    Thyroid disease    Vertigo     PAST SURGICAL HISTORY: Past Surgical History:  Procedure Laterality Date   ANTERIOR CERVICAL DECOMP/DISCECTOMY FUSION N/A 07/23/2022   Procedure: Anterior Cervical Discectomy and Fusion Cervical Three-Four/Four-Five/Five-Six;  Surgeon: Vallarie Mare, MD;  Location: [facility];  Service: Neurosurgery;  Laterality: N/A;  3C   FLEXIBLE SIGMOIDOSCOPY      FAMILY HISTORY: Family History  Problem Relation Age of Onset   Cancer  Mother        pancreatic   Cancer Father        stomach   Kidney failure Brother    Other Brother        neck tumor   Hypertension Brother    Diabetes Brother    Lupus Daughter    Hypertension Sister    Other Son        boating accident    SOCIAL HISTORY: Social History   Socioeconomic History   Marital status: Married    Spouse name: Not on file   Number of children: Not on file   Years of education: Not on file   Highest education level: Not on file  Occupational History   Occupation: Sales  Tobacco Use   Smoking status: Former   Smokeless tobacco: Never  Substance and Sexual Activity   Alcohol use: Yes    Comment: occ   Drug use: No   Sexual activity: Not on file  Other Topics  Concern   Not on file  Social History Narrative   Not on file   Social Determinants of Health   Financial Resource Strain: Not on file  Food Insecurity: Not on file  Transportation Needs: Not on file  Physical Activity: Not on file  Stress: Not on file  Social Connections: Not on file  Intimate Partner Violence: Not on file      Marcial Pacas, M.D. Ph.D.  [facility] [address], [place], [place] [address] Ph: [phone] Fax: [phone]

## 2022-12-19 ENCOUNTER — Encounter: Payer: Self-pay | Admitting: Neurology

## 2022-12-19 DIAGNOSIS — M1612 Unilateral primary osteoarthritis, left hip: Secondary | ICD-10-CM | POA: Diagnosis not present

## 2022-12-26 NOTE — Progress Notes (Signed)
Please place orders for PAT appointment scheduled 12/27/22.

## 2022-12-27 ENCOUNTER — Encounter (HOSPITAL_COMMUNITY): Payer: Self-pay

## 2022-12-27 ENCOUNTER — Encounter (HOSPITAL_COMMUNITY)
Admission: RE | Admit: 2022-12-27 | Discharge: 2022-12-27 | Disposition: A | Discharge status: Home / Self Care | Payer: No Typology Code available for payment source | Source: Ambulatory Visit | Attending: Orthopedic Surgery | Admitting: Orthopedic Surgery

## 2022-12-27 VITALS — BP 101/72 | HR 77 | Temp 98.7°F | Resp 12 | Ht 75.0 in | Wt 237.0 lb

## 2022-12-27 DIAGNOSIS — Z01812 Encounter for preprocedural laboratory examination: Secondary | ICD-10-CM | POA: Insufficient documentation

## 2022-12-27 DIAGNOSIS — Z01818 Encounter for other preprocedural examination: Secondary | ICD-10-CM

## 2022-12-27 DIAGNOSIS — K754 Autoimmune hepatitis: Secondary | ICD-10-CM | POA: Diagnosis not present

## 2022-12-27 LAB — CBC
HCT: 38.8 % — ABNORMAL LOW (ref 39.0–52.0)
Hemoglobin: 13.4 g/dL (ref 13.0–17.0)
MCH: 32.1 pg (ref 26.0–34.0)
MCHC: 34.5 g/dL (ref 30.0–36.0)
MCV: 93 fL (ref 80.0–100.0)
Platelets: 169 10*3/uL (ref 150–400)
RBC: 4.17 MIL/uL — ABNORMAL LOW (ref 4.22–5.81)
RDW: 11.9 % (ref 11.5–15.5)
WBC: 5.8 10*3/uL (ref 4.0–10.5)
nRBC: 0 % (ref 0.0–0.2)

## 2022-12-27 LAB — COMPREHENSIVE METABOLIC PANEL
ALT: 18 U/L (ref 0–44)
AST: 25 U/L (ref 15–41)
Albumin: 4.4 g/dL (ref 3.5–5.0)
Alkaline Phosphatase: 54 U/L (ref 38–126)
Anion gap: 8 (ref 5–15)
BUN: 13 mg/dL (ref 8–23)
CO2: 23 mmol/L (ref 22–32)
Calcium: 9.2 mg/dL (ref 8.9–10.3)
Chloride: 105 mmol/L (ref 98–111)
Creatinine, Ser: 1.13 mg/dL (ref 0.61–1.24)
GFR, Estimated: >60 mL/min (ref >60)
Glucose, Bld: 141 mg/dL — ABNORMAL HIGH (ref 70–99)
Potassium: 4 mmol/L (ref 3.5–5.1)
Sodium: 136 mmol/L (ref 135–145)
Total Bilirubin: 0.6 mg/dL (ref 0.3–1.2)
Total Protein: 7.7 g/dL (ref 6.5–8.1)

## 2022-12-27 LAB — SURGICAL PCR SCREEN
MRSA, PCR: NEGATIVE
Staphylococcus aureus: NEGATIVE

## 2022-12-27 NOTE — Progress Notes (Addendum)
COVID Vaccine Completed: yes  Date of COVID positive in last 90 days: no  PCP - Lona Kettle, MD Cardiologist - Daneen Schick, MD (LOV 05/08/21)  Chest x-ray - n/a EKG - 07/19/22 Epic Stress Test - n/a ECHO - 06/11/21 Epic Cardiac Cath - n/a Pacemaker/ICD device last checked: n/a Spinal Cord Stimulator: n/a  Bowel Prep - no  Sleep Study - n/a CPAP -   Fasting Blood Sugar - n/a Checks Blood Sugar _____ times a day  Last dose of GLP1 agonist-  N/A GLP1 instructions:  N/A   Last dose of SGLT-2 inhibitors-  N/A SGLT-2 instructions: N/A   Blood Thinner Instructions: n/a Aspirin Instructions: Last Dose:  Activity level: Can go up a flight of stairs and perform activities of daily living without stopping and without symptoms of chest pain or shortness of breath.   Anesthesia review: left fascicular block, autoimmune hepatitis, MVR, been having spasms since a few days after his clavicle surgery. 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 He is seeing a rheumatologist 12/30/22 at 1100  Patient denies shortness of breath, fever, cough and chest pain at PAT appointment  Patient verbalized understanding of instructions that were given to them at the PAT appointment. Patient was also instructed that they will need to review over the PAT instructions again at home before surgery.

## 2022-12-27 NOTE — Patient Instructions (Addendum)
SURGICAL WAITING ROOM VISITATION  Patients having surgery or a procedure may have no more than 2 support people in the waiting area - these visitors may rotate.    Children under the age of 51 must have an adult with them who is not the patient.  Due to an increase in RSV and influenza rates and associated hospitalizations, children ages 34 and under may not visit patients in [place].  If the patient needs to stay at the hospital during part of their recovery, the visitor guidelines for inpatient rooms apply. Pre-op nurse will coordinate an appropriate time for 1 support person to accompany patient in pre-op.  This support person may not rotate.    Please refer to the [facility] website for the visitor guidelines for Inpatients (after your surgery is over and you are in a regular room).     Your procedure is scheduled on: 01/06/23   Report to [facility] Main Entrance    Report to admitting at 9:50 AM   Call this number if you have problems the morning of surgery [phone]   Do not eat food :After Midnight.   After Midnight you may have the following liquids until 9:20 AM DAY OF SURGERY  Water Non-Citrus Juices (without pulp, NO RED-Apple, White grape, White cranberry) Black Coffee (NO MILK/CREAM OR CREAMERS, sugar ok)  Clear Tea (NO MILK/CREAM OR CREAMERS, sugar ok) regular and decaf                             Plain Jell-O (NO RED)                                           Fruit ices (not with fruit pulp, NO RED)                                     Popsicles (NO RED)                                                               Sports drinks like Gatorade (NO RED)          If you have questions, please contact your surgeon's office.   FOLLOW BOWEL PREP AND ANY ADDITIONAL PRE OP INSTRUCTIONS YOU RECEIVED FROM YOUR SURGEON'S OFFICE!!!     Oral Hygiene is also important to reduce your risk of infection.                                    Remember -  BRUSH YOUR TEETH THE MORNING OF SURGERY WITH YOUR REGULAR TOOTHPASTE  DENTURES WILL BE REMOVED PRIOR TO SURGERY PLEASE DO NOT APPLY "Poly grip" OR ADHESIVES!!!   Take these medicines the morning of surgery with A SIP OF WATER: Levothyroxine, Imuran                              You may not have any metal on  your body including jewelry, and body piercing             Do not wear lotions, powders, cologne, or deodorant              Men may shave face and neck.   Do not bring valuables to the hospital. [place].   Contacts, glasses, dentures or bridgework may not be worn into surgery.  DO NOT [place]. PHARMACY WILL DISPENSE MEDICATIONS LISTED ON YOUR MEDICATION LIST TO YOU DURING YOUR ADMISSION [place]!    Patients discharged on the day of surgery will not be allowed to drive home.  Someone NEEDS to stay with you for the first 24 hours after anesthesia.   Special Instructions: Bring a copy of your healthcare power of attorney and living will documents the day of surgery if you haven't scanned them before.              Please read over the following fact sheets you were given: IF [place] [phone] Schneiders    If you received a COVID test during your pre-op visit  it is requested that you wear a mask when out in public, stay away from anyone that may not be feeling well and notify your surgeon if you develop symptoms. If you test positive for Covid or have been in contact with anyone that has tested positive in the last 10 days please notify you surgeon.    [facility] - Preparing for Surgery Before surgery, you can play an important role.  Because skin is not sterile, your skin needs to be as free of germs as possible.  You can reduce the number of germs on your skin by washing with CHG (chlorahexidine gluconate) soap before surgery.  CHG is an  antiseptic cleaner which kills germs and bonds with the skin to continue killing germs even after washing. Please DO NOT use if you have an allergy to CHG or antibacterial soaps.  If your skin becomes reddened/irritated stop using the CHG and inform your nurse when you arrive at Short Stay. Do not shave (including legs and underarms) for at least 48 hours prior to the first CHG shower.  You may shave your face/neck.  Please follow these instructions carefully:  1.  Shower with CHG Soap the night before surgery and the  morning of surgery.  2.  If you choose to wash your hair, wash your hair first as usual with your normal  shampoo.  3.  After you shampoo, rinse your hair and body thoroughly to remove the shampoo.                             4.  Use CHG as you would any other liquid soap.  You can apply chg directly to the skin and wash.  Gently with a scrungie or clean washcloth.  5.  Apply the CHG Soap to your body ONLY FROM THE NECK DOWN.   Do   not use on face/ open                           Wound or open sores. Avoid contact with eyes, ears mouth and   genitals (private parts).  Wash face,  Genitals (private parts) with your normal soap.             6.  Wash thoroughly, paying special attention to the area where your    surgery  will be performed.  7.  Thoroughly rinse your body with warm water from the neck down.  8.  DO NOT shower/wash with your normal soap after using and rinsing off the CHG Soap.                9.  Pat yourself dry with a clean towel.            10.  Wear clean pajamas.            11.  Place clean sheets on your bed the night of your first shower and do not  sleep with pets. Day of Surgery : Do not apply any lotions/deodorants the morning of surgery.  Please wear clean clothes to the hospital/surgery center.  FAILURE TO FOLLOW THESE INSTRUCTIONS MAY RESULT IN THE CANCELLATION OF YOUR SURGERY  PATIENT  SIGNATURE_________________________________  NURSE SIGNATURE__________________________________  ________________________________________________________________________ WHAT IS A BLOOD TRANSFUSION? Blood Transfusion Information  A transfusion is the replacement of blood or some of its parts. Blood is made up of multiple cells which provide different functions. Red blood cells carry oxygen and are used for blood loss replacement. White blood cells fight against infection. Platelets control bleeding. Plasma helps clot blood. Other blood products are available for specialized needs, such as hemophilia or other clotting disorders. BEFORE THE TRANSFUSION  Who gives blood for transfusions?  Healthy volunteers who are fully evaluated to make sure their blood is safe. This is blood bank blood. Transfusion therapy is the safest it has ever been in the practice of medicine. Before blood is taken from a donor, a complete history is taken to make sure that person has no history of diseases nor engages in risky social behavior (examples are intravenous drug use or sexual activity with multiple partners). The donor's travel history is screened to minimize risk of transmitting infections, such as malaria. The donated blood is tested for signs of infectious diseases, such as HIV and hepatitis. The blood is then tested to be sure it is compatible with you in order to minimize the chance of a transfusion reaction. If you or a relative donates blood, this is often done in anticipation of surgery and is not appropriate for emergency situations. It takes many days to process the donated blood. RISKS AND COMPLICATIONS Although transfusion therapy is very safe and saves many lives, the main dangers of transfusion include:  Getting an infectious disease. Developing a transfusion reaction. This is an allergic reaction to something in the blood you were given. Every precaution is taken to prevent this. The decision to  have a blood transfusion has been considered carefully by your caregiver before blood is given. Blood is not given unless the benefits outweigh the risks. AFTER THE TRANSFUSION Right after receiving a blood transfusion, you will usually feel much better and more energetic. This is especially true if your red blood cells have gotten low (anemic). The transfusion raises the level of the red blood cells which carry oxygen, and this usually causes an energy increase. The nurse administering the transfusion will monitor you carefully for complications. HOME CARE INSTRUCTIONS  No special instructions are needed after a transfusion. You may find your energy is better. Speak with your caregiver about any limitations on activity for underlying diseases you may  have. SEEK MEDICAL CARE IF:  Your condition is not improving after your transfusion. You develop redness or irritation at the intravenous (IV) site. SEEK IMMEDIATE MEDICAL CARE IF:  Any of the following symptoms occur over the next 12 hours: Shaking chills. You have a temperature by mouth above 102 F (38.9 C), not controlled by medicine. Chest, back, or muscle pain. People around you feel you are not acting correctly or are confused. Shortness of breath or difficulty breathing. Dizziness and fainting. You get a rash or develop hives. You have a decrease in urine output. Your urine turns a dark color or changes to pink, red, or brown. Any of the following symptoms occur over the next 10 days: You have a temperature by mouth above 102 F (38.9 C), not controlled by medicine. Shortness of breath. Weakness after normal activity. The white part of the eye turns yellow (jaundice). You have a decrease in the amount of urine or are urinating less often. Your urine turns a dark color or changes to pink, red, or brown. Document Released: 12/06/2000 Document Revised: 03/02/2012 Document Reviewed: 07/25/2008 [facility] Patient Information 2014  [place], [place].  _______________________________________________________________________

## 2022-12-30 ENCOUNTER — Encounter: Payer: Self-pay | Admitting: Neurology

## 2022-12-30 ENCOUNTER — Other Ambulatory Visit: Payer: Self-pay | Admitting: Orthopedic Surgery

## 2023-01-01 DIAGNOSIS — M1612 Unilateral primary osteoarthritis, left hip: Secondary | ICD-10-CM | POA: Diagnosis present

## 2023-01-01 NOTE — H&P (Signed)
TOTAL HIP ADMISSION H&P  Patient is admitted for left total hip arthroplasty.  Subjective:  Chief Complaint: left hip pain  HPI: Alex West, 67 y.o. male, has a history of pain and functional disability in the left hip(s) due to arthritis and patient has failed non-surgical conservative treatments for greater than 12 weeks to include NSAID's and/or analgesics, flexibility and strengthening excercises, use of assistive devices, weight reduction as appropriate, and activity modification.  Onset of symptoms was gradual starting  several  years ago with gradually worsening course since that time.The patient noted no past surgery on the left hip(s).  Patient currently rates pain in the left hip at 10 out of 10 with activity. Patient has night pain, worsening of pain with activity and weight bearing, trendelenberg gait, pain that interfers with activities of daily living, and pain with passive range of motion. Patient has evidence of subchondral cysts and joint space narrowing by imaging studies. This condition presents safety issues increasing the risk of falls.   There is no current active infection.  Patient Active Problem List   Diagnosis Date Noted   Arthritis of left hip 01/01/2023   Paresthesia 08/21/2022   Stenosis of cervical spine with myelopathy ([place]) 07/23/2022   Gait abnormality 06/18/2022   Cervical myelopathy ([place]) 06/17/2022   Weakness 06/06/2022   Neck pain 06/06/2022   OA (osteoarthritis) of hip 07/24/2021   Immunosuppressed status ([place]) 09/05/2014   Hepatitis, autoimmune ([place]) 09/05/2014   Past Medical History:  Diagnosis Date   Arthritis    BPH (benign prostatic hyperplasia)    Colon polyp    ED (erectile dysfunction)    Elevated liver enzymes    Gallstones    Hepatitis    History of anemia    Hypercholesterolemia    Hypothyroidism    Mild tricuspid regurgitation    Mitral valve regurgitation    Thyroid disease    Vertigo     Past Surgical History:   Procedure Laterality Date   ANTERIOR CERVICAL DECOMP/DISCECTOMY FUSION N/A 07/23/2022   Procedure: Anterior Cervical Discectomy and Fusion Cervical Three-Four/Four-Five/Five-Six;  Surgeon: Vallarie Mare, MD;  Location: [facility];  Service: Neurosurgery;  Laterality: N/A;  3C   FLEXIBLE SIGMOIDOSCOPY      Current Facility-Administered Medications  Medication Dose Route Frequency Provider Last Rate Last Admin   acetaminophen (TYLENOL) tablet 975 mg  975 mg Oral Q4H PRN Copland, Gay Filler, MD       Current Outpatient Medications  Medication Sig Dispense Refill Last Dose   azaTHIOprine (IMURAN) 50 MG tablet Take 75 mg by mouth daily.      levothyroxine (SYNTHROID) 200 MCG tablet Take 200 mcg by mouth daily before breakfast.      Multiple Vitamins-Minerals (CENTRUM ADULT PO) Take 1 tablet by mouth daily.      gabapentin (NEURONTIN) 300 MG capsule Take 1 capsule (300 mg total) by mouth 3 (three) times daily. (Patient not taking: Reported on 12/25/2022) 90 capsule 11 Not Taking   oxybutynin (DITROPAN-XL) 5 MG 24 hr tablet Take 1 tablet (5 mg total) by mouth at bedtime. (Patient not taking: Reported on 12/18/2022) 30 tablet 11    sildenafil (VIAGRA) 100 MG tablet Take 100 mg by mouth as needed for erectile dysfunction. (Patient not taking: Reported on 12/25/2022)   Not Taking   tadalafil (CIALIS) 5 MG tablet Take 5 mg by mouth daily as needed (BPH). (Patient not taking: Reported on 12/18/2022)      Allergies  Allergen Reactions  Tadalafil Other (See Comments)    Headache     Social History   Tobacco Use   Smoking status: Former   Smokeless tobacco: Never  Substance Use Topics   Alcohol use: Yes    Comment: occ    Family History  Problem Relation Age of Onset   Cancer Mother        pancreatic   Cancer Father        stomach   Kidney failure Brother    Other Brother        neck tumor   Hypertension Brother    Diabetes Brother    Lupus Daughter    Hypertension Sister    Other Son         boating accident     Review of Systems  Constitutional: Negative.   HENT: Negative.    Eyes: Negative.   Respiratory: Negative.    Cardiovascular: Negative.   Gastrointestinal:  Positive for constipation.  Endocrine: Negative.   Genitourinary:  Positive for difficulty urinating.  Musculoskeletal:  Positive for arthralgias.  Allergic/Immunologic: Negative.   Neurological: Negative.   Psychiatric/Behavioral: Negative.      Objective:  Physical Exam Constitutional:      Appearance: Normal appearance.  HENT:     Head: Normocephalic and atraumatic.     Nose: Nose normal.  Eyes:     Pupils: Pupils are equal, round, and reactive to light.  Cardiovascular:     Pulses: Normal pulses.  Pulmonary:     Effort: Pulmonary effort is normal.  Musculoskeletal:     Cervical back: Neck supple.     Comments: Any attempts at internal rotation of the left hip cause severe pain he walks with a left-sided limp foot tap is negative.  Pain to resisted flexion of the hip good power to testing of abduction extension abduction and rotation.  Skin is intact grossly neurovascular intact distally.  Good power to testing the quadriceps and hamstring muscles.  Neurological:     General: No focal deficit present.     Mental Status: He is alert and oriented to person, place, and time. Mental status is at baseline.  Psychiatric:        Mood and Affect: Mood normal.        Behavior: Behavior normal.        Thought Content: Thought content normal.        Judgment: Judgment normal.     Vital signs in last 24 hours:    Labs:   Estimated body mass index is 29.62 kg/m as calculated from the following:   Height as of 12/27/22: '6\' 3"'$  (1.905 m).   Weight as of 12/27/22: 107.5 kg.   Imaging Review Plain radiographs demonstrate  AP pelvis and crosstable lateral shows bone-on-bone arthritic changes the left hip in the superior weightbearing dome there subchondral cysts in the acetabulum mild lateral  subluxation of the femoral head.  And there is sclerosis of the femoral head as well.      Assessment/Plan:  End stage arthritis, left hip(s)  The patient history, physical examination, clinical judgement of the provider and imaging studies are consistent with end stage degenerative joint disease of the left hip(s) and total hip arthroplasty is deemed medically necessary. The treatment options including medical management, injection therapy, arthroscopy and arthroplasty were discussed at length. The risks and benefits of total hip arthroplasty were presented and reviewed. The risks due to aseptic loosening, infection, stiffness, dislocation/subluxation,  thromboembolic complications and other imponderables were discussed.  The patient acknowledged the explanation, agreed to proceed with the plan and consent was signed. Patient is being admitted for inpatient treatment for surgery, pain control, PT, OT, prophylactic antibiotics, VTE prophylaxis, progressive ambulation and ADL's and discharge planning.The patient is planning to be discharged home with home health services    Patient's anticipated LOS is less than 2 midnights, meeting these requirements: - Younger than 69 - Lives within 1 hour of care - Has a competent adult at home to recover with post-op recover - NO history of  - Chronic pain requiring opiods  - Diabetes  - Coronary Artery Disease  - Heart failure  - Heart attack  - Stroke  - DVT/VTE  - Cardiac arrhythmia  - Respiratory Failure/COPD  - Renal failure  - Anemia  - Advanced Liver disease

## 2023-01-03 NOTE — Procedures (Signed)
Full Name: Montrae Braithwaite Gender: Male MRN #: [id number] Date of Birth: November 19, 1956    Visit Date: 12/18/2022 08:17 Age: 67 Years Examining Physician: Dr. Marcial Pacas Referring Physician: Dr. Marcial Pacas Height: 6 feet 3 inch History: 67 year old male, with history of severe cervical stenosis involving C3-4, 5 6, status post cervical decompression surgery, with persistent upper extremity paresthesia, gait abnormality, repeat MRI of cervical spine showed cervical myelomalacia  Summary of the test:  Nerve conduction study: Right sural, superficial peroneal, sensory responses were normal.  Left median, ulnar sensory responses were normal.  Right median, ulnar sensory responses showed normal peak latency, with slightly decreased snap amplitude.  Right tibial motor responses showed mildly decreased CMAP amplitude, was otherwise normal. Right peroneal to EDB motor responses were normal.  Bilateral ulnar and median motor responses were normal  Electromyography: Selected needle examination were performed at right upper, lower extremity muscles, right cervical and lumbosacral paraspinal muscles. There is evidence of chronic neuropathic changes involving right C5, 6, 7 myotomes.  There is mildly increased insertional activity at lower cervical paraspinal muscles.  There is also evidence of chronic neuropathic changes involving right L4-5 facet myotomes.  There was no spontaneous activity at right lumbosacral paraspinal muscles.   Conclusion: This is an abnormal study.  There is electrodiagnostic evidence of chronic mild right C5-6-7 cervical radiculopathy, and also chronic right L4-5 S1 lumbar radiculopathy.  There is no evidence of large fiber peripheral neuropathy, or upper extremity focal neuropathy.    ------------------------------- Catalina Pizza.D.Ph.D.  [facility] [address], [place], [place] [address] Tel: [phone] Fax:  [phone]  Verbal informed consent was obtained from the patient, patient was informed of potential risk of procedure, including bruising, bleeding, hematoma formation, infection, muscle weakness, muscle pain, numbness, among others.        [place]    Nerve / Sites Muscle Latency Ref. Amplitude Ref. Rel Amp Segments Distance Velocity Ref. Area    ms ms mV mV %  cm m/s m/s mVms  R Median - APB     Wrist APB 3.9 ?4.4 6.1 ?4.0 100 Wrist - APB 7   19.3     Upper arm APB 9.0  6.1  101 Upper arm - Wrist 27 53 ?49 20.0         Upper arm - APB      L Median - APB     Wrist APB 4.0 ?4.4 9.8 ?4.0 100 Wrist - APB 7   29.8     Upper arm APB 8.7  8.5  86.8 Upper arm - Wrist 26 56 ?49 26.4  R Ulnar - ADM     Wrist ADM 3.0 ?3.3 11.4 ?6.0 100 Wrist - ADM 7   35.5     B.Elbow ADM 6.1  11.2  98.4 B.Elbow - Wrist 19 62 ?49 36.8     A.Elbow ADM 8.8  10.4  93.4 A.Elbow - B.Elbow 16 60 ?49 36.8  L Ulnar - ADM     Wrist ADM 2.6 ?3.3 10.4 ?6.0 100 Wrist - ADM 7   34.8     B.Elbow ADM 6.1  9.8  93.9 B.Elbow - Wrist 18 51 ?49 33.7     A.Elbow ADM 9.1  9.0  91.8 A.Elbow - B.Elbow 15 51 ?49 34.6  R Peroneal - EDB     Ankle EDB 4.5 ?6.5 3.7 ?2.0 100 Ankle - EDB 9   8.7     Fib  head EDB 13.5  3.5  95 Fib head - Ankle 36 40 ?44 10.2     Pop fossa EDB 15.4  2.8  79.3 Pop fossa - Fib head 11 59 ?44 7.8         Pop fossa - Ankle      R Tibial - AH     Ankle AH 4.9 ?5.8 2.2 ?4.0 100 Ankle - AH 9   3.8     Pop fossa AH 17.0  1.1  47.6 Pop fossa - Ankle 51 42 ?41 2.2                 SNC    Nerve / Sites Rec. Site Peak Lat Ref.  Amp Ref. Segments Distance    ms ms V V  cm  R Sural - Ankle (Calf)     Calf Ankle 2.5 ?4.4 7 ?6 Calf - Ankle 14  R Superficial peroneal - Ankle     Lat leg Ankle 2.5 ?4.4 7 ?6 Lat leg - Ankle 14  R Median - Orthodromic (Dig II, Mid palm)     Dig II Wrist 3.1 ?3.4 4 ?10 Dig II - Wrist 13  L Median - Orthodromic (Dig II, Mid palm)     Dig II Wrist 3.1 ?3.4 10 ?10 Dig II - Wrist 13  R  Ulnar - Orthodromic, (Dig V, Mid palm)     Dig V Wrist 2.8 ?3.1 3 ?5 Dig V - Wrist 11  L Ulnar - Orthodromic, (Dig V, Mid palm)     Dig V Wrist 2.8 ?3.1 6 ?5 Dig V - Wrist 75                 F  Wave    Nerve F Lat Ref.   ms ms  R Ulnar - ADM 33.6 ?32.0  R Tibial - AH 68.1 ?56.0  L Ulnar - ADM 34.1 ?32.0           EMG Summary Table    Spontaneous MUAP Recruitment  Muscle IA Fib PSW Fasc Other Amp Dur. Poly Pattern  R. First dorsal interosseous Normal None None None _______ Normal Normal Normal Normal  R. Pronator teres Normal None None None _______ Normal Normal Normal Normal  R. Brachioradialis Normal None None None _______ Normal Increased 1+ Reduced  R. Biceps brachii Normal None None None _______ Increased Increased 1+ Reduced  R. Deltoid Normal None None None _______ Normal Increased 1+ Reduced  R. Triceps brachii Normal None None None _______ Increased Increased 1+ Reduced  R. Cervical paraspinals Increased None None None _______ Increased Increased Normal Normal  R. Tibialis anterior Normal None None None _______ Normal Normal Normal Reduced  R. Tibialis posterior Normal None None None _______ Normal Normal Normal Reduced  R. Peroneus longus Normal None None None _______ Normal Normal Normal Reduced  R. Gastrocnemius (Medial head) Normal None None None _______ Normal Normal Normal Normal  R. Vastus lateralis Normal None None None _______ Normal Normal Normal Normal  R. Biceps femoris (long head) Normal None None None _______ Normal Normal Normal Reduced  R. Biceps femoris (short head) Normal None None None _______ Normal Normal Normal Reduced  R. Lumbar paraspinals (low) Normal None None None _______ Normal Normal Normal Normal  R. Lumbar paraspinals (mid) Normal None None None _______ Normal Normal Normal Normal

## 2023-01-03 NOTE — Progress Notes (Signed)
Pt aware to arrive at [facility] admitting at 0700 on Monday 01/06/2023 for scheduled surgical procedure. No food after midnight; clear liquids from Midnight till 0630 / consume entire pre surgery drink if given by 0630 then nothing by mouth.

## 2023-01-06 ENCOUNTER — Ambulatory Visit (HOSPITAL_COMMUNITY)
Admission: RE | Admit: 2023-01-06 | Discharge: 2023-01-06 | Disposition: A | Discharge status: Home / Self Care | Payer: No Typology Code available for payment source | Source: Ambulatory Visit | Attending: Orthopedic Surgery | Admitting: Orthopedic Surgery

## 2023-01-06 ENCOUNTER — Ambulatory Visit (HOSPITAL_COMMUNITY): Payer: No Typology Code available for payment source

## 2023-01-06 ENCOUNTER — Encounter (HOSPITAL_COMMUNITY): Payer: Self-pay | Admitting: Orthopedic Surgery

## 2023-01-06 ENCOUNTER — Ambulatory Visit (HOSPITAL_BASED_OUTPATIENT_CLINIC_OR_DEPARTMENT_OTHER): Payer: No Typology Code available for payment source | Admitting: Certified Registered Nurse Anesthetist

## 2023-01-06 ENCOUNTER — Encounter (HOSPITAL_COMMUNITY)
Admission: RE | Disposition: A | Discharge status: Home / Self Care | Payer: Self-pay | Source: Ambulatory Visit | Attending: Orthopedic Surgery

## 2023-01-06 ENCOUNTER — Other Ambulatory Visit: Payer: Self-pay

## 2023-01-06 ENCOUNTER — Ambulatory Visit (HOSPITAL_COMMUNITY): Payer: No Typology Code available for payment source | Admitting: Physician Assistant

## 2023-01-06 DIAGNOSIS — M1612 Unilateral primary osteoarthritis, left hip: Secondary | ICD-10-CM | POA: Diagnosis not present

## 2023-01-06 DIAGNOSIS — E039 Hypothyroidism, unspecified: Secondary | ICD-10-CM | POA: Insufficient documentation

## 2023-01-06 HISTORY — PX: TOTAL HIP ARTHROPLASTY: SHX124

## 2023-01-06 LAB — TYPE AND SCREEN
ABO/RH(D): B POS
Antibody Screen: NEGATIVE

## 2023-01-06 SURGERY — ARTHROPLASTY, HIP, TOTAL, ANTERIOR APPROACH
Anesthesia: Spinal | Site: Hip | Laterality: Left

## 2023-01-06 MED ORDER — TIZANIDINE HCL 2 MG PO TABS: 2.0000 mg | ORAL_TABLET | Freq: Four times a day (QID) | ORAL | 0 refills | Status: DC | PRN

## 2023-01-06 MED ORDER — ONDANSETRON HCL 4 MG/2ML IJ SOLN
INTRAMUSCULAR | Status: DC | PRN
  Administered 2023-01-06: 4 mg via INTRAVENOUS

## 2023-01-06 MED ORDER — BUPIVACAINE HCL (PF) 0.25 % IJ SOLN
INTRAMUSCULAR | Status: DC | PRN
  Administered 2023-01-06: 30 mL

## 2023-01-06 MED ORDER — MIDAZOLAM HCL 5 MG/5ML IJ SOLN
INTRAMUSCULAR | Status: DC | PRN
  Administered 2023-01-06: 2 mg via INTRAVENOUS

## 2023-01-06 MED ORDER — LACTATED RINGERS IV SOLN: INTRAVENOUS | Status: DC

## 2023-01-06 MED ORDER — ASPIRIN 81 MG PO TBEC: 81.0000 mg | DELAYED_RELEASE_TABLET | Freq: Two times a day (BID) | ORAL | 0 refills | Status: DC

## 2023-01-06 MED ORDER — EPHEDRINE SULFATE-NACL 50-0.9 MG/10ML-% IV SOSY
PREFILLED_SYRINGE | INTRAVENOUS | Status: DC | PRN
  Administered 2023-01-06: 10 mg via INTRAVENOUS
  Administered 2023-01-06: 5 mg via INTRAVENOUS
  Administered 2023-01-06: 10 mg via INTRAVENOUS

## 2023-01-06 MED ORDER — DEXAMETHASONE SODIUM PHOSPHATE 10 MG/ML IJ SOLN
INTRAMUSCULAR | Status: AC
  Filled 2023-01-06: qty 1

## 2023-01-06 MED ORDER — TRANEXAMIC ACID-NACL 1000-0.7 MG/100ML-% IV SOLN
1000.0000 mg | Freq: Once | INTRAVENOUS | Status: AC
  Administered 2023-01-06: 1000 mg via INTRAVENOUS

## 2023-01-06 MED ORDER — TRANEXAMIC ACID 1000 MG/10ML IV SOLN
INTRAVENOUS | Status: DC | PRN
  Administered 2023-01-06: 2000 mg via TOPICAL

## 2023-01-06 MED ORDER — MIDAZOLAM HCL 2 MG/2ML IJ SOLN
INTRAMUSCULAR | Status: AC
  Filled 2023-01-06: qty 2

## 2023-01-06 MED ORDER — 0.9 % SODIUM CHLORIDE (POUR BTL) OPTIME
TOPICAL | Status: DC | PRN
  Administered 2023-01-06: 1000 mL

## 2023-01-06 MED ORDER — BUPIVACAINE LIPOSOME 1.3 % IJ SUSP: 10.0000 mL | Freq: Once | INTRAMUSCULAR | Status: DC

## 2023-01-06 MED ORDER — SODIUM CHLORIDE (PF) 0.9 % IJ SOLN
INTRAMUSCULAR | Status: AC
  Filled 2023-01-06: qty 50

## 2023-01-06 MED ORDER — FENTANYL CITRATE (PF) 100 MCG/2ML IJ SOLN
INTRAMUSCULAR | Status: DC | PRN
  Administered 2023-01-06: 50 ug via INTRAVENOUS

## 2023-01-06 MED ORDER — POVIDONE-IODINE 10 % EX SWAB
2.0000 | Freq: Once | CUTANEOUS | Status: AC
  Administered 2023-01-06: 2 via TOPICAL

## 2023-01-06 MED ORDER — PROPOFOL 500 MG/50ML IV EMUL
INTRAVENOUS | Status: AC
  Filled 2023-01-06: qty 50

## 2023-01-06 MED ORDER — DEXAMETHASONE SODIUM PHOSPHATE 10 MG/ML IJ SOLN
INTRAMUSCULAR | Status: DC | PRN
  Administered 2023-01-06: 5 mg via INTRAVENOUS

## 2023-01-06 MED ORDER — FENTANYL CITRATE (PF) 100 MCG/2ML IJ SOLN
INTRAMUSCULAR | Status: AC
  Filled 2023-01-06: qty 2

## 2023-01-06 MED ORDER — CEFAZOLIN SODIUM-DEXTROSE 2-4 GM/100ML-% IV SOLN
2.0000 g | INTRAVENOUS | Status: AC
  Administered 2023-01-06: 2 g via INTRAVENOUS
  Filled 2023-01-06: qty 100

## 2023-01-06 MED ORDER — STERILE WATER FOR IRRIGATION IR SOLN
Status: DC | PRN
  Administered 2023-01-06: 2000 mL

## 2023-01-06 MED ORDER — BUPIVACAINE IN DEXTROSE 0.75-8.25 % IT SOLN
INTRATHECAL | Status: DC | PRN
  Administered 2023-01-06: 2 mL via INTRATHECAL

## 2023-01-06 MED ORDER — ONDANSETRON HCL 4 MG/2ML IJ SOLN
INTRAMUSCULAR | Status: AC
  Filled 2023-01-06: qty 2

## 2023-01-06 MED ORDER — ORAL CARE MOUTH RINSE: 15.0000 mL | Freq: Once | OROMUCOSAL | Status: AC

## 2023-01-06 MED ORDER — METHOCARBAMOL 500 MG PO TABS: 500.0000 mg | ORAL_TABLET | Freq: Four times a day (QID) | ORAL | Status: DC | PRN

## 2023-01-06 MED ORDER — SODIUM CHLORIDE 0.9% FLUSH
INTRAVENOUS | Status: DC | PRN
  Administered 2023-01-06: 50 mL

## 2023-01-06 MED ORDER — TRANEXAMIC ACID 1000 MG/10ML IV SOLN
2000.0000 mg | INTRAVENOUS | Status: DC
  Filled 2023-01-06: qty 20

## 2023-01-06 MED ORDER — LACTATED RINGERS IV BOLUS
250.0000 mL | Freq: Once | INTRAVENOUS | Status: AC
  Administered 2023-01-06: 250 mL via INTRAVENOUS

## 2023-01-06 MED ORDER — OXYCODONE HCL 5 MG PO TABS
ORAL_TABLET | ORAL | Status: AC
  Administered 2023-01-06: 10 mg
  Filled 2023-01-06: qty 2

## 2023-01-06 MED ORDER — TRANEXAMIC ACID-NACL 1000-0.7 MG/100ML-% IV SOLN
INTRAVENOUS | Status: AC
  Filled 2023-01-06: qty 100

## 2023-01-06 MED ORDER — FENTANYL CITRATE PF 50 MCG/ML IJ SOSY: 25.0000 ug | PREFILLED_SYRINGE | INTRAMUSCULAR | Status: DC | PRN

## 2023-01-06 MED ORDER — PROPOFOL 1000 MG/100ML IV EMUL
INTRAVENOUS | Status: AC
  Filled 2023-01-06: qty 100

## 2023-01-06 MED ORDER — BUPIVACAINE LIPOSOME 1.3 % IJ SUSP
INTRAMUSCULAR | Status: AC
  Filled 2023-01-06: qty 10

## 2023-01-06 MED ORDER — METHOCARBAMOL 500 MG IVPB - SIMPLE MED
INTRAVENOUS | Status: AC
  Administered 2023-01-06: 500 mg
  Filled 2023-01-06: qty 55

## 2023-01-06 MED ORDER — TRANEXAMIC ACID-NACL 1000-0.7 MG/100ML-% IV SOLN
1000.0000 mg | INTRAVENOUS | Status: AC
  Administered 2023-01-06: 1000 mg via INTRAVENOUS
  Filled 2023-01-06: qty 100

## 2023-01-06 MED ORDER — ACETAMINOPHEN 500 MG PO TABS: 1000.0000 mg | ORAL_TABLET | Freq: Once | ORAL | Status: DC

## 2023-01-06 MED ORDER — LACTATED RINGERS IV BOLUS
500.0000 mL | Freq: Once | INTRAVENOUS | Status: AC
  Administered 2023-01-06: 500 mL via INTRAVENOUS

## 2023-01-06 MED ORDER — ACETAMINOPHEN 10 MG/ML IV SOLN
INTRAVENOUS | Status: DC | PRN
  Administered 2023-01-06: 1000 mg via INTRAVENOUS

## 2023-01-06 MED ORDER — BUPIVACAINE-EPINEPHRINE (PF) 0.5% -1:200000 IJ SOLN
INTRAMUSCULAR | Status: AC
  Filled 2023-01-06: qty 30

## 2023-01-06 MED ORDER — AMISULPRIDE (ANTIEMETIC) 5 MG/2ML IV SOLN: 10.0000 mg | Freq: Once | INTRAVENOUS | Status: DC | PRN

## 2023-01-06 MED ORDER — PROPOFOL 500 MG/50ML IV EMUL
INTRAVENOUS | Status: DC | PRN
  Administered 2023-01-06: 75 ug/kg/min via INTRAVENOUS

## 2023-01-06 MED ORDER — OXYCODONE-ACETAMINOPHEN 5-325 MG PO TABS: 1.0000 | ORAL_TABLET | ORAL | 0 refills | Status: DC | PRN

## 2023-01-06 MED ORDER — PROPOFOL 10 MG/ML IV BOLUS
INTRAVENOUS | Status: DC | PRN
  Administered 2023-01-06: 30 mg via INTRAVENOUS

## 2023-01-06 MED ORDER — METHOCARBAMOL 500 MG IVPB - SIMPLE MED: 500.0000 mg | Freq: Four times a day (QID) | INTRAVENOUS | Status: DC | PRN

## 2023-01-06 MED ORDER — BUPIVACAINE LIPOSOME 1.3 % IJ SUSP
INTRAMUSCULAR | Status: DC | PRN
  Administered 2023-01-06: 10 mL

## 2023-01-06 MED ORDER — CHLORHEXIDINE GLUCONATE 0.12 % MT SOLN
15.0000 mL | Freq: Once | OROMUCOSAL | Status: AC
  Administered 2023-01-06: 15 mL via OROMUCOSAL

## 2023-01-06 MED ORDER — ACETAMINOPHEN 10 MG/ML IV SOLN
INTRAVENOUS | Status: AC
  Filled 2023-01-06: qty 100

## 2023-01-06 MED ORDER — BUPIVACAINE HCL 0.25 % IJ SOLN
INTRAMUSCULAR | Status: AC
  Filled 2023-01-06: qty 1

## 2023-01-06 SURGICAL SUPPLY — 44 items
BAG COUNTER SPONGE SURGICOUNT (BAG) ×1 IMPLANT
BAG DECANTER FOR FLEXI CONT (MISCELLANEOUS) ×2 IMPLANT
BAG SPNG CNTER NS LX DISP (BAG)
BLADE SAW SGTL 18X1.27X75 (BLADE) ×1 IMPLANT
COVER PERINEAL POST (MISCELLANEOUS) ×1 IMPLANT
COVER SURGICAL LIGHT HANDLE (MISCELLANEOUS) ×1 IMPLANT
DRAPE STERI IOBAN 125X83 (DRAPES) ×1 IMPLANT
DRAPE U-SHAPE 47X51 STRL (DRAPES) ×2 IMPLANT
DRSG AQUACEL AG ADV 3.5X10 (GAUZE/BANDAGES/DRESSINGS) ×1 IMPLANT
DURAPREP 26ML APPLICATOR (WOUND CARE) ×1 IMPLANT
ELECT BLADE TIP CTD 4 INCH (ELECTRODE) ×1 IMPLANT
ELECT REM PT RETURN 15FT ADLT (MISCELLANEOUS) ×1 IMPLANT
ELIMINATOR HOLE APEX DEPUY (Hips) IMPLANT
GLOVE BIO SURGEON STRL SZ7.5 (GLOVE) ×1 IMPLANT
GLOVE BIO SURGEON STRL SZ8.5 (GLOVE) ×1 IMPLANT
GLOVE BIOGEL PI IND STRL 8 (GLOVE) ×1 IMPLANT
GLOVE BIOGEL PI IND STRL 9 (GLOVE) ×1 IMPLANT
GOWN STRL REUS W/ TWL XL LVL3 (GOWN DISPOSABLE) ×2 IMPLANT
GOWN STRL REUS W/TWL XL LVL3 (GOWN DISPOSABLE) ×2
HEAD CERAMIC 36 PLUS5 (Hips) IMPLANT
HOLDER FOLEY CATH W/STRAP (MISCELLANEOUS) ×1 IMPLANT
KIT TURNOVER KIT A (KITS) IMPLANT
MANIFOLD NEPTUNE II (INSTRUMENTS) ×1 IMPLANT
NDL HYPO 21X1.5 SAFETY (NEEDLE) ×2 IMPLANT
NEEDLE HYPO 21X1.5 SAFETY (NEEDLE) ×3 IMPLANT
NS IRRIG 1000ML POUR BTL (IV SOLUTION) ×1 IMPLANT
PACK ANTERIOR HIP CUSTOM (KITS) ×1 IMPLANT
PIN SECT CUP 56MM (Hips) IMPLANT
PINNACLE ALTRX PLUS 4 N 36X56 (Hips) IMPLANT
SPIKE FLUID TRANSFER (MISCELLANEOUS) ×1 IMPLANT
STEM FEMORAL SZ9 HIGH ACTIS (Stem) IMPLANT
SUT ETHIBOND NAB CT1 #1 30IN (SUTURE) IMPLANT
SUT VIC AB 0 CT1 27 (SUTURE)
SUT VIC AB 0 CT1 27XBRD ANBCTR (SUTURE) IMPLANT
SUT VIC AB 1 CTX 36 (SUTURE) ×1
SUT VIC AB 1 CTX36XBRD ANBCTR (SUTURE) ×1 IMPLANT
SUT VIC AB 2-0 CT1 27 (SUTURE) ×1
SUT VIC AB 2-0 CT1 TAPERPNT 27 (SUTURE) ×1 IMPLANT
SUT VIC AB 3-0 CT1 27 (SUTURE) ×1
SUT VIC AB 3-0 CT1 TAPERPNT 27 (SUTURE) ×1 IMPLANT
SYR CONTROL 10ML LL (SYRINGE) ×3 IMPLANT
TRAY CATH INTERMITTENT SS 16FR (CATHETERS) IMPLANT
TRAY FOLEY MTR SLVR 16FR STAT (SET/KITS/TRAYS/PACK) IMPLANT
TUBE SUCTION HIGH CAP CLEAR NV (SUCTIONS) ×1 IMPLANT

## 2023-01-06 NOTE — Anesthesia Preprocedure Evaluation (Signed)
Anesthesia Evaluation  Patient identified by MRN, date of birth, ID band Patient awake    Reviewed: Allergy & Precautions, NPO status , Patient's Chart, lab work & pertinent test results  Airway Mallampati: II  TM Distance: >3 FB Neck ROM: Full    Dental  (+) Dental Advisory Given   Pulmonary former smoker   breath sounds clear to auscultation       Cardiovascular negative cardio ROS  Rhythm:Regular Rate:Normal     Neuro/Psych negative neurological ROS     GI/Hepatic negative GI ROS,,,(+) Hepatitis -, Autoimmune  Endo/Other  Hypothyroidism    Renal/GU negative Renal ROS     Musculoskeletal  (+) Arthritis ,    Abdominal   Peds  Hematology negative hematology ROS (+)   Anesthesia Other Findings   Reproductive/Obstetrics                             Anesthesia Physical Anesthesia Plan  ASA: 2  Anesthesia Plan: Spinal   Post-op Pain Management: Tylenol PO (pre-op)*   Induction: Intravenous  PONV Risk Score and Plan: 1 and Dexamethasone, Ondansetron, Propofol infusion and Treatment may vary due to age or medical condition  Airway Management Planned: Natural Airway and Simple Face Mask  Additional Equipment:   Intra-op Plan:   Post-operative Plan:   Informed Consent: I have reviewed the patients History and Physical, chart, labs and discussed the procedure including the risks, benefits and alternatives for the proposed anesthesia with the patient or authorized representative who has indicated his/her understanding and acceptance.     Dental advisory given  Plan Discussed with: CRNA  Anesthesia Plan Comments:         Anesthesia Quick Evaluation

## 2023-01-06 NOTE — Transfer of Care (Signed)
Immediate Anesthesia Transfer of Care Note  Patient: Alex West  Procedure(s) Performed: LEFT TOTAL HIP ARTHROPLASTY ANTERIOR APPROACH (Left: Hip)  Patient Location: PACU  Anesthesia Type:MAC and Spinal  Level of Consciousness: drowsy and patient cooperative  Airway & Oxygen Therapy: Patient Spontanous Breathing and Patient connected to face mask oxygen  Post-op Assessment: Report given to RN and Post -op Vital signs reviewed and stable  Post vital signs: Reviewed and stable  Last Vitals:  Vitals Value Taken Time  BP 110/64 01/06/23 1148  Temp 36.6 C 01/06/23 1148  Pulse 84 01/06/23 1153  Resp 17 01/06/23 1153  SpO2 100 % 01/06/23 1153  Vitals shown include unvalidated device data.  Last Pain:  Vitals:   01/06/23 0736  TempSrc:   PainSc: 0-No pain         Complications: No notable events documented.

## 2023-01-06 NOTE — Op Note (Signed)
PATIENT ID:      Alex West  MRN:     [id number] DOB/AGE:    [birth year] / 67 y.o.  OPERATIVE REPORT   DATE OF PROCEDURE:  01/06/2023      PREOPERATIVE DIAGNOSIS:  LEFT HIP OSTEOARTHRITIS                                                         POSTOPERATIVE DIAGNOSIS:  Same                                                         PROCEDURE: Anterior L total hip arthroplasty using a 56 mm DePuy Pinnacle  Cup, [employer], 0-degree polyethylene liner, a +5 mm x 1m ceramic head, a 9 hi Depuy Actis stem  SURGEON: Alex West ASSISTANT:   EKerry Hough [employer]emp[employer] Energy (present throughout entire procedure and necessary for timely completion of the procedure)   ANESTHESIA: Spinal, Exparel '133mg'$  injection BLOOD LOSS: 350 cc FLUID REPLACEMENT: 1600 cc crystalloid TRANEXAMIC ACID: 1gm IV, 2gm Topical COMPLICATIONS: none    INDICATIONS FOR PROCEDURE: A 67y.o. year-old With  LEFT HIP OSTEOARTHRITIS   for 3 years, x-rays show bone-on-bone arthritic changes, and osteophytes. Despite conservative measures with observation, anti-inflammatory medicine, narcotics, use of a cane, has severe unremitting pain and can ambulate only a few blocks before resting. Patient desires elective L total hip arthroplasty to decrease pain and increase function. The risks, benefits, and alternatives were discussed at length including but not limited to the risks of infection, bleeding, nerve injury, stiffness, blood clots, the need for revision surgery, cardiopulmonary complications, among others, and they were willing to proceed. Questions answered      PROCEDURE IN DETAIL: The patient was identified by armband, received preoperative IV antibiotics, [facility] , appropriate anesthetic monitors were attached and anesthesia was induced with the patient on the gurney. HANA boots were applied to the feet, and the patient  was transferred to the HANA  table with a peroneal post and support underneath the non-operative leg. The operative lower extremity was then prepped and draped in the usual sterile fashion from just above the iliac crest to the knee. And a timeout procedure was performed. EKerry Hough PHardin NegusPParksidewas present and scrubbed throughout the case, critical for assistance with, positioning, exposure, retraction, instrumentation, and closure.Skin along incision area was injected with 10 cc of Exparel solution. We then made a 13 cm incision along the interval at the leading edge of the tensor fascia lata of starting at 2 cm lateral to the ASIS. Small bleeders in the skin and subcutaneous tissue identified and cauterized we dissected down to the fascia and made an incision in the fascia allowing uKoreato elevate the fascia of the tensor muscle and exploited the interval between the rectus and the tensor fascia lata. A Cobra retractor was then placed along the superior neck of the femur. A cerebellar retractor was used to expose the interval between the tensor fascia lata and the rectus femoris.  We identified and cauterized the  ascending branch of the anterior circumflex artery. A second Cobra retractor along the inferior neck of the femur. A small Hohmann retractor was placed underneath the origin of the rectus femoris, giving [place] good medial exposure. Using [place]uers fatty tissue was removed from in front of the anterior capsule. The capsule was then incised, starting out at the superior anterior rim of the acetabulum going laterally along the anterior neck. The capsule was then teed along the neck superiorly and inferiorly. Electrocautery was used to release capsule from the anterior and medial neck of the femur to allow external rotation. The cobra retractors were then placed along the inferior and superior neck. The hip was externally rotated to 40 degrees, traction applied and locked. We perform a standard neck cut with the oscillating saw and removed the  femoral head with a power corkscrew. We then placed a medium curved medium homan retractor in the cotyloid notch and standard cobra retractor posteriorly along the acetabular rim. Exposed labral tissue and osteophytes were then removed. We then sequentially reamed up to a 55 mm basket reamer obtaining good coverage in all quadrants, verified by C-arm imaging. Under C-arm control we then hammered into place a 56 mm Pinnacle cup in 45 of abduction and 15 of anteversion. The cup seated nicely and required no supplemental screws. We then placed a central hole Eliminator and a +4 mm, 0 polyethylene liner. The foot was then externally rotated to 130-140. The limb was extended and adducted to the floor, delivering the proximal femur up into the wound. A medium curved Hohmann retractor was placed over the greater trochanter and a long Homan retractor along the posterior femoral neck completing the exposure and lateralizing the femur. We then performed releases superiorly and and inferiorly of the capsule going back to the pirformis fossa superiorly and to the lesser trochanter inferiorly. We then entered the proximal femur with the box cutting offset chisel followed by, a canal sounder, the chili pepper and broaching up to a 9 broach. This seated nicely and we reamed the calcar. A trial reduction was performed with a 5 mm X 36 mm head.The limb lengths were checked by c-arm, and the hip was stable in 90 of external rotation. At this point the trial components removed and we hammered into place a hi  Offset # 9Actis stem with coating. A + 5 mm x 36 head was then hammered into place. The hip was reduced and final C-arm images obtained. The wound was thoroughly irrigated with normal saline solution. We repaired the ant capsule and the tensor fascia lot a with running 0 vicryl suture. the subcutaneous and subcuticular layers were closed with running 3-0 Vicryl suture followed by an Aquacil dressing. At this point the  patient was awaken and transferred to hospital gurney without difficulty.   [name] 01/06/2023, 9:38 AM

## 2023-01-06 NOTE — Progress Notes (Signed)
Physical Therapy Treatment Patient Details Name: Alex West MRN: [id number] DOB: 03-Aug-1956 Today's Date: 01/06/2023      Clinical Impression   Pt seen POD0 received supine on stretcher in PACU for attempt to same-day discharge. Required min guard for bed mobility, for transfers, and for ambulation in hallway with RW 144f. Pt completed stair training with min guard and verbal cues for sequencing. Provided HEP and pt completed exercises with safe form and minimal cuing. All education completed and pt has no further questions. Pt has met mobility goals for safe discharge home, PT is signing off, should needs change please reconsult. Thank you for this referral.      01/06/23 1722  PT Visit Information  Last PT Received On 01/06/23  Assistance Needed +1  History of Present Illness Pt is a 89yomale presenting s/p L-THA, AA on 01/06/23. PMH: BPH, vertigo, anterior cervical fusion C3-6 07/23/2022, hypothroidism, gait abnormality, cervical myelopathy, autoimmune hepatitis.  Subjective Data  Subjective I feel better  Patient Stated Goal To walk without pain  Precautions  Precautions None  Restrictions  Weight Bearing Restrictions No  Other Position/Activity Restrictions wbat  Pain Assessment  Pain Assessment 0-10  Pain Score 6  Pain Location left hip  Pain Descriptors / Indicators Operative site guarding  Pain Intervention(s) Monitored during session;Repositioned  Cognition  Arousal/Alertness Awake/alert  Behavior During Therapy WFL for tasks assessed/performed  Overall Cognitive Status Within Functional Limits for tasks assessed  Bed Mobility  Overal bed mobility Needs Assistance  Bed Mobility Sit to Supine;Supine to Sit  Supine to sit Min guard;HOB elevated  General bed mobility comments Min guard for safety, no physical assist  Transfers  Overall transfer level Needs assistance  Equipment used Rolling walker (2 wheels)  Transfers Sit to/from Stand  Sit to Stand Min  guard;From elevated surface  General transfer comment Min guard for safety, VCs for sequencing  Ambulation/Gait  Ambulation/Gait assistance Min guard  Gait Distance (Feet) 80 Feet  Assistive device Rolling walker (2 wheels)  Gait Pattern/deviations Step-to pattern  General Gait Details Pt ambulated with RW and min guard, no physical assist required or overt LOB noted  Gait velocity decreased  Stairs Yes  Stairs assistance Min guard  Stair Management One rail Left;Step to pattern;Sideways  Number of Stairs 2  General stair comments pt completed stair training with min guard, VCs for sequencing, no overt LOB noted or physical assist required  Balance  Overall balance assessment Needs assistance  Sitting-balance support Feet supported;No upper extremity supported  Sitting balance-Leahy Scale Good  Standing balance support Reliant on assistive device for balance;During functional activity;Bilateral upper extremity supported  Standing balance-Leahy Scale Poor  General Comments  General comments (skin integrity, edema, etc.) Wife present  Exercises  Exercises Total Joint;Other exercises  Total Joint Exercises  Ankle Circles/Pumps AROM;Both;20 reps  Quad Sets AROM;Both;Other reps (comment) (2)  Short Arc Quad AROM;Left;Other reps (comment) (2)  Long Arc Quad Other (comment) (educated not performed)  Heel Slides AROM;Left;Other reps (comment) (3)  Hip ABduction/ADduction AROM;Left;Other reps (comment) (3; standing: educated not performed)  Knee Flexion Other (comment);Standing (educated not performed)  Marching in Standing Other (comment) (educated not performed)  Standing Hip Extension Other (comment) (educated not performed)  PT - End of Session  Equipment Utilized During Treatment Gait belt  Activity Tolerance Patient tolerated treatment well;No increased pain  Patient left with call bell/phone within reach;with family/visitor present;in chair  Nurse Communication Mobility  status   PT - Assessment/Plan  PT Plan  Current plan remains appropriate  PT Visit Diagnosis Pain;Difficulty in walking, not elsewhere classified (R26.2)  Pain - Right/Left Left  Pain - part of body Hip  PT Frequency (ACUTE ONLY) 7X/week  Follow Up Recommendations Follow physician's recommendations for discharge plan and follow up therapies  Assistance recommended at discharge Frequent or constant Supervision/Assistance  Patient can return home with the following A little help with walking and/or transfers;A little help with bathing/dressing/bathroom;Assist for transportation;Assistance with cooking/housework;Help with stairs or ramp for entrance  PT equipment None recommended by PT  AM-PAC PT "6 Clicks" Mobility Outcome Measure (Version 2)  Help needed turning from your back to your side while in a flat bed without using bedrails? 4  Help needed moving from lying on your back to sitting on the side of a flat bed without using bedrails? 3  Help needed moving to and from a bed to a chair (including a wheelchair)? 3  Help needed standing up from a chair using your arms (e.g., wheelchair or bedside chair)? 3  Help needed to walk in hospital room? 3  Help needed climbing 3-5 steps with a railing?  3  6 Click Score 19  Consider Recommendation of Discharge To: Home with [facility]  Progressive Mobility  What is the highest level of mobility based on the progressive mobility assessment? Level 5 (Walks with assist in room/hall) - Balance while stepping forward/back and can walk in room with assist - Complete  Activity Ambulated with assistance in hallway  PT Goal Progression  Progress towards PT goals Goals met/education completed, patient discharged from PT  Acute Rehab PT Goals  PT Goal Formulation With patient  Time For Goal Achievement 01/13/23  Potential to Achieve Goals Good  PT Time Calculation  PT Start Time (ACUTE ONLY) 1645  PT Stop Time (ACUTE ONLY) 1720  PT Time Calculation (min) (ACUTE  ONLY) 35 min  PT General Charges  $$ ACUTE PT VISIT 1 Visit  PT Treatments  $Gait Training 8-22 mins  $Therapeutic Exercise 8-22 mins   Coolidge Breeze, PT, DPT [facility] Department Office: [phone] Weekend pager: [phone]

## 2023-01-06 NOTE — Anesthesia Procedure Notes (Signed)
Procedure Name: MAC Date/Time: 01/06/2023 9:47 AM  Performed by: West Pugh, CRNAPre-anesthesia Checklist: Patient identified, Emergency Drugs available, Suction available, Patient being monitored and Timeout performed Patient Re-evaluated:Patient Re-evaluated prior to induction Oxygen Delivery Method: Simple face mask Preoxygenation: Pre-oxygenation with 100% oxygen Induction Type: IV induction Placement Confirmation: positive ETCO2 Dental Injury: Teeth and Oropharynx as per pre-operative assessment

## 2023-01-06 NOTE — Discharge Instructions (Addendum)

## 2023-01-06 NOTE — Anesthesia Procedure Notes (Signed)
Spinal  Patient location during procedure: OR Start time: 01/06/2023 9:47 AM End time: 01/06/2023 9:51 AM Reason for block: surgical anesthesia Staffing Performed: resident/CRNA  Anesthesiologist: Suzette Battiest, MD Resident/CRNA: West Pugh, CRNA Performed by: West Pugh, CRNA Authorized by: Suzette Battiest, MD   Preanesthetic Checklist Completed: patient identified, IV checked, site marked, risks and benefits discussed, surgical consent, monitors and equipment checked, pre-op evaluation and timeout performed Spinal Block Patient position: sitting Prep: DuraPrep and site prepped and draped Patient monitoring: heart rate, cardiac monitor, continuous pulse ox and blood pressure Approach: midline Location: L3-4 Injection technique: single-shot Needle Needle type: Pencan  Needle gauge: 24 G Needle length: 10 cm Assessment Sensory level: T4 Events: CSF return Additional Notes IV functioning, monitors applied to pt. Expiration date of kit checked and confirmed to be in date. Pt denies use of anticoagulants and Plt count 169,000. Sterile prep and drape, hand hygiene and sterile gloved used. Pt was positioned and spine was prepped in sterile fashion. Skin was anesthetized with lidocaine. Free flow of clear CSF obtained prior to injecting local anesthetic into CSF x 1 attempt. Spinal needle aspirated freely following injection. Needle was carefully withdrawn, and pt tolerated procedure well. Loss of motor and sensory on exam post injection. Dr Ola Spurr present for procedure.

## 2023-01-06 NOTE — Interval H&P Note (Signed)
History and Physical Interval Note:  01/06/2023 9:37 AM  Alex West  has presented today for surgery, with the diagnosis of LEFT HIP OSTEOARTHRITIS.  The various methods of treatment have been discussed with the patient and family. After consideration of risks, benefits and other options for treatment, the patient has consented to  Procedure(s): LEFT TOTAL HIP ARTHROPLASTY ANTERIOR APPROACH (Left) as a surgical intervention.  The patient's history has been reviewed, patient examined, no change in status, stable for surgery.  I have reviewed the patient's chart and labs.  Questions were answered to the patient's satisfaction.     Kerin Salen

## 2023-01-06 NOTE — Evaluation (Signed)
Physical Therapy Evaluation Patient Details Name: Alex West MRN: [id number] DOB: April 21, 1956 Today's Date: 01/06/2023  History of Present Illness  Pt is a 67yo male presenting s/p L-THA, AA on 01/06/23. PMH: BPH, vertigo, anterior cervical fusion C3-6 07/23/2022, hypothroidism, gait abnormality, cervical myelopathy, autoimmune hepatitis.   Clinical Impression  Alex West is a 67 y.o. male POD 0 s/p L-THA, AA. Patient reports modified independence using SPC with mobility at baseline. Patient is now limited by functional impairments (see PT problem list below) and requires min assist for bed mobility and  for transfers. Patient was able to ambulate 3 feet with RW and min assist; further mobility deferred secondary to reduced sensation and motor control in bilateral lower extremities, suspect secondary to spinal anesthesia.. Patient instructed in exercise to facilitate ROM and circulation to manage edema.  Patient will benefit from continued skilled PT interventions to address impairments and progress towards PLOF. Acute PT will follow to progress mobility and stair training in preparation for safe discharge home; we will attempt second session today for possible same-day discharge from PACU.     Recommendations for follow up therapy are one component of a multi-disciplinary discharge planning process, led by the attending physician.  Recommendations may be updated based on patient status, additional functional criteria and insurance authorization.  Follow Up Recommendations Follow physician's recommendations for discharge plan and follow up therapies      Assistance Recommended at Discharge Frequent or constant Supervision/Assistance  Patient can return home with the following  A little help with walking and/or transfers;A little help with bathing/dressing/bathroom;Assist for transportation;Assistance with cooking/housework;Help with stairs or ramp for entrance    Equipment  Recommendations None recommended by PT  Recommendations for Other Services       Functional Status Assessment Patient has had a recent decline in their functional status and demonstrates the ability to make significant improvements in function in a reasonable and predictable amount of time.     Precautions / Restrictions Precautions Precautions: None Restrictions Weight Bearing Restrictions: No Other Position/Activity Restrictions: wbat      Mobility  Bed Mobility Overal bed mobility: Needs Assistance Bed Mobility: Sit to Supine, Supine to Sit     Supine to sit: Min guard, HOB elevated Sit to supine: Min assist   General bed mobility comments: Pt required min assist to bring BLE back up on to stretcher, min guard for supine to sit.    Transfers Overall transfer level: Needs assistance Equipment used: Rolling walker (2 wheels) Transfers: Sit to/from Stand Sit to Stand: Min assist, From elevated surface           General transfer comment: Min assist for light lift assist bringing weight up and forward. Pt able to complete standing lateral weight shifts and 1-2reps of standing marches in RW.    Ambulation/Gait Ambulation/Gait assistance: Min assist Gait Distance (Feet): 3 Feet Assistive device: Rolling walker (2 wheels) Gait Pattern/deviations: Step-to pattern Gait velocity: decreased     General Gait Details: Pt completed VERY short distance ambulation 1-2 steps forward and backward and 2 lateral steps to the right to adjust supine positioning in bed. Pt unable to lift LLE very well and slid foot along floor. Pt reporting decreased sensation on bottom of bilateral feet as well as in bilateral glutes. Returned pt to supine, further mobility deferred  Stairs            Wheelchair Mobility    Modified Rankin (Stroke Patients Only)  Balance Overall balance assessment: Needs assistance Sitting-balance support: Feet supported, No upper extremity  supported Sitting balance-Leahy Scale: Good     Standing balance support: Reliant on assistive device for balance, During functional activity, Bilateral upper extremity supported Standing balance-Leahy Scale: Poor                               Pertinent Vitals/Pain Pain Assessment Pain Assessment: 0-10 Pain Score: 7  Pain Location: left hip Pain Descriptors / Indicators: Operative site guarding Pain Intervention(s): Limited activity within patient's tolerance, Monitored during session, Repositioned, Ice applied    Home Living Family/patient expects to be discharged to:: Private residence Living Arrangements: Spouse/significant other Available Help at Discharge: Family;Available 24 hours/day Type of Home: House Home Access: Stairs to enter Entrance Stairs-Rails: Left Entrance Stairs-Number of Steps: 3   Home Layout: Multi-level;Able to live on main level with bedroom/bathroom Home Equipment: Rolling Walker (2 wheels);Cane - single point;Toilet riser      Prior Function Prior Level of Function : Independent/Modified Independent;Driving             Mobility Comments: SPC ADLs Comments: IND     Hand Dominance        Extremity/Trunk Assessment   Upper Extremity Assessment Upper Extremity Assessment: Overall WFL for tasks assessed    Lower Extremity Assessment Lower Extremity Assessment: LLE deficits/detail;RLE deficits/detail RLE Deficits / Details: MMT ank DF 3+/5, PF 4/5 RLE Sensation: decreased light touch LLE Deficits / Details: MMT ank DF 3+/5, PF 4/5 LLE Sensation: decreased light touch    Cervical / Trunk Assessment Cervical / Trunk Assessment: Normal  Communication   Communication: No difficulties  Cognition Arousal/Alertness: Awake/alert Behavior During Therapy: WFL for tasks assessed/performed Overall Cognitive Status: Within Functional Limits for tasks assessed                                          General  Comments General comments (skin integrity, edema, etc.): Wife Varnetta present    Exercises Total Joint Exercises Ankle Circles/Pumps: AROM, Both, 20 reps   Assessment/Plan    PT Assessment Patient needs continued PT services  PT Problem List Decreased strength;Decreased range of motion;Decreased activity tolerance;Decreased balance;Decreased mobility;Decreased coordination;Pain       PT Treatment Interventions DME instruction;Gait training;Stair training;Functional mobility training;Therapeutic activities;Therapeutic exercise;Balance training;Neuromuscular re-education;Patient/family education    PT Goals (Current goals can be found in the Care Plan section)  Acute Rehab PT Goals Patient Stated Goal: To walk without pain PT Goal Formulation: With patient Time For Goal Achievement: 01/13/23 Potential to Achieve Goals: Good    Frequency 7X/week     Co-evaluation               AM-PAC PT "6 Clicks" Mobility  Outcome Measure Help needed turning from your back to your side while in a flat bed without using bedrails?: None Help needed moving from lying on your back to sitting on the side of a flat bed without using bedrails?: A Little Help needed moving to and from a bed to a chair (including a wheelchair)?: A Little Help needed standing up from a chair using your arms (e.g., wheelchair or bedside chair)?: A Little Help needed to walk in hospital room?: A Little Help needed climbing 3-5 steps with a railing? : A Little 6 Click Score: 19    End  of Session Equipment Utilized During Treatment: Gait belt Activity Tolerance: Other (comment) (Limited by numbness and motor control suspect secondary to spinal anesthesia) Patient left: in bed;with call bell/phone within reach;with family/visitor present Nurse Communication: Mobility status PT Visit Diagnosis: Pain;Difficulty in walking, not elsewhere classified (R26.2) Pain - Right/Left: Left Pain - part of body: Hip    Time:  7158-0638 PT Time Calculation (min) (ACUTE ONLY): 13 min   Charges:   PT Evaluation $PT Eval Low Complexity: [place], PT, DPT [facility] Office: [phone] Weekend pager: [phone]  Coolidge Breeze 01/06/2023, 3:14 PM

## 2023-01-07 ENCOUNTER — Encounter (HOSPITAL_COMMUNITY): Payer: Self-pay | Admitting: Orthopedic Surgery

## 2023-01-07 NOTE — Anesthesia Postprocedure Evaluation (Signed)
Anesthesia Post Note  Patient: Ernesta Amble  Procedure(s) Performed: LEFT TOTAL HIP ARTHROPLASTY ANTERIOR APPROACH (Left: Hip)     Patient location during evaluation: PACU Anesthesia Type: Spinal Level of consciousness: awake and alert Pain management: pain level controlled Vital Signs Assessment: post-procedure vital signs reviewed and stable Respiratory status: spontaneous breathing and respiratory function stable Cardiovascular status: blood pressure returned to baseline and stable Postop Assessment: spinal receding Anesthetic complications: no   No notable events documented.  Last Vitals:  Vitals:   01/06/23 1630 01/06/23 1715  BP: (!) 150/93 (!) 152/93  Pulse: 60 65  Resp: 20 18  Temp: 36.6 C 36.6 C  SpO2: 100% 100%    Last Pain:  Vitals:   01/06/23 1630  TempSrc:   PainSc: 0-No pain                 Tiajuana Amass

## 2023-01-23 ENCOUNTER — Emergency Department (HOSPITAL_COMMUNITY): Payer: No Typology Code available for payment source

## 2023-01-23 ENCOUNTER — Emergency Department (HOSPITAL_COMMUNITY)
Admit: 2023-01-23 | Discharge: 2023-01-23 | Disposition: A | Discharge status: Home / Self Care | Payer: No Typology Code available for payment source | Attending: Emergency Medicine | Admitting: Emergency Medicine

## 2023-01-23 ENCOUNTER — Other Ambulatory Visit: Payer: Self-pay

## 2023-01-23 ENCOUNTER — Inpatient Hospital Stay (HOSPITAL_COMMUNITY)
Admission: EM | Admit: 2023-01-23 | Discharge: 2023-01-26 | DRG: 872 | Disposition: A | Discharge status: Home Health | Payer: No Typology Code available for payment source | Attending: Internal Medicine | Admitting: Internal Medicine

## 2023-01-23 DIAGNOSIS — R5381 Other malaise: Secondary | ICD-10-CM | POA: Diagnosis not present

## 2023-01-23 DIAGNOSIS — G959 Disease of spinal cord, unspecified: Secondary | ICD-10-CM | POA: Diagnosis present

## 2023-01-23 DIAGNOSIS — Z8249 Family history of ischemic heart disease and other diseases of the circulatory system: Secondary | ICD-10-CM

## 2023-01-23 DIAGNOSIS — A4159 Other Gram-negative sepsis: Secondary | ICD-10-CM | POA: Diagnosis present

## 2023-01-23 DIAGNOSIS — N401 Enlarged prostate with lower urinary tract symptoms: Secondary | ICD-10-CM | POA: Diagnosis present

## 2023-01-23 DIAGNOSIS — Z79899 Other long term (current) drug therapy: Secondary | ICD-10-CM

## 2023-01-23 DIAGNOSIS — R609 Edema, unspecified: Secondary | ICD-10-CM | POA: Diagnosis not present

## 2023-01-23 DIAGNOSIS — Z87891 Personal history of nicotine dependence: Secondary | ICD-10-CM | POA: Diagnosis not present

## 2023-01-23 DIAGNOSIS — Z888 Allergy status to other drugs, medicaments and biological substances status: Secondary | ICD-10-CM

## 2023-01-23 DIAGNOSIS — E039 Hypothyroidism, unspecified: Secondary | ICD-10-CM | POA: Diagnosis present

## 2023-01-23 DIAGNOSIS — A419 Sepsis, unspecified organism: Secondary | ICD-10-CM | POA: Diagnosis not present

## 2023-01-23 DIAGNOSIS — N390 Urinary tract infection, site not specified: Secondary | ICD-10-CM | POA: Diagnosis present

## 2023-01-23 DIAGNOSIS — N320 Bladder-neck obstruction: Secondary | ICD-10-CM | POA: Diagnosis present

## 2023-01-23 DIAGNOSIS — Z981 Arthrodesis status: Secondary | ICD-10-CM

## 2023-01-23 DIAGNOSIS — Z96642 Presence of left artificial hip joint: Secondary | ICD-10-CM | POA: Diagnosis present

## 2023-01-23 DIAGNOSIS — R531 Weakness: Secondary | ICD-10-CM

## 2023-01-23 DIAGNOSIS — N136 Pyonephrosis: Secondary | ICD-10-CM | POA: Diagnosis present

## 2023-01-23 DIAGNOSIS — R32 Unspecified urinary incontinence: Secondary | ICD-10-CM | POA: Diagnosis present

## 2023-01-23 DIAGNOSIS — E7800 Pure hypercholesterolemia, unspecified: Secondary | ICD-10-CM | POA: Diagnosis present

## 2023-01-23 DIAGNOSIS — E119 Type 2 diabetes mellitus without complications: Secondary | ICD-10-CM | POA: Diagnosis present

## 2023-01-23 DIAGNOSIS — Z841 Family history of disorders of kidney and ureter: Secondary | ICD-10-CM

## 2023-01-23 DIAGNOSIS — Z7982 Long term (current) use of aspirin: Secondary | ICD-10-CM | POA: Diagnosis not present

## 2023-01-23 DIAGNOSIS — R338 Other retention of urine: Secondary | ICD-10-CM | POA: Diagnosis present

## 2023-01-23 DIAGNOSIS — Z8719 Personal history of other diseases of the digestive system: Secondary | ICD-10-CM

## 2023-01-23 DIAGNOSIS — R6520 Severe sepsis without septic shock: Secondary | ICD-10-CM | POA: Diagnosis present

## 2023-01-23 DIAGNOSIS — Z833 Family history of diabetes mellitus: Secondary | ICD-10-CM | POA: Diagnosis not present

## 2023-01-23 DIAGNOSIS — K754 Autoimmune hepatitis: Secondary | ICD-10-CM | POA: Diagnosis present

## 2023-01-23 DIAGNOSIS — Z79890 Hormone replacement therapy (postmenopausal): Secondary | ICD-10-CM

## 2023-01-23 DIAGNOSIS — R7881 Bacteremia: Secondary | ICD-10-CM | POA: Insufficient documentation

## 2023-01-23 DIAGNOSIS — Z1152 Encounter for screening for COVID-19: Secondary | ICD-10-CM | POA: Diagnosis not present

## 2023-01-23 DIAGNOSIS — N179 Acute kidney failure, unspecified: Principal | ICD-10-CM | POA: Diagnosis present

## 2023-01-23 LAB — I-STAT CHEM 8, ED
BUN: 92 mg/dL — ABNORMAL HIGH (ref 8–23)
Calcium, Ion: 1.11 mmol/L — ABNORMAL LOW (ref 1.15–1.40)
Chloride: 104 mmol/L (ref 98–111)
Creatinine, Ser: 12.4 mg/dL — ABNORMAL HIGH (ref 0.61–1.24)
Glucose, Bld: 104 mg/dL — ABNORMAL HIGH (ref 70–99)
HCT: 27 % — ABNORMAL LOW (ref 39.0–52.0)
Hemoglobin: 9.2 g/dL — ABNORMAL LOW (ref 13.0–17.0)
Potassium: 4.9 mmol/L (ref 3.5–5.1)
Sodium: 134 mmol/L — ABNORMAL LOW (ref 135–145)
TCO2: 17 mmol/L — ABNORMAL LOW (ref 22–32)

## 2023-01-23 LAB — CBC WITH DIFFERENTIAL/PLATELET
Abs Immature Granulocytes: 0.37 10*3/uL — ABNORMAL HIGH (ref 0.00–0.07)
Basophils Absolute: 0.1 10*3/uL (ref 0.0–0.1)
Basophils Relative: 0 %
Eosinophils Absolute: 0.2 10*3/uL (ref 0.0–0.5)
Eosinophils Relative: 1 %
HCT: 24.1 % — ABNORMAL LOW (ref 39.0–52.0)
Hemoglobin: 8.4 g/dL — ABNORMAL LOW (ref 13.0–17.0)
Immature Granulocytes: 3 %
Lymphocytes Relative: 4 %
Lymphs Abs: 0.5 10*3/uL — ABNORMAL LOW (ref 0.7–4.0)
MCH: 33.1 pg (ref 26.0–34.0)
MCHC: 34.9 g/dL (ref 30.0–36.0)
MCV: 94.9 fL (ref 80.0–100.0)
Monocytes Absolute: 0.9 10*3/uL (ref 0.1–1.0)
Monocytes Relative: 6 %
Neutro Abs: 12.2 10*3/uL — ABNORMAL HIGH (ref 1.7–7.7)
Neutrophils Relative %: 86 %
Platelets: 116 10*3/uL — ABNORMAL LOW (ref 150–400)
RBC: 2.54 MIL/uL — ABNORMAL LOW (ref 4.22–5.81)
RDW: 13.3 % (ref 11.5–15.5)
WBC: 14.2 10*3/uL — ABNORMAL HIGH (ref 4.0–10.5)
nRBC: 0.2 % (ref 0.0–0.2)

## 2023-01-23 LAB — RESP PANEL BY RT-PCR (RSV, FLU A&B, COVID)  RVPGX2
Influenza A by PCR: NEGATIVE
Influenza B by PCR: NEGATIVE
Resp Syncytial Virus by PCR: NEGATIVE
SARS Coronavirus 2 by RT PCR: NEGATIVE

## 2023-01-23 LAB — COMPREHENSIVE METABOLIC PANEL
ALT: 12 U/L (ref 0–44)
AST: 25 U/L (ref 15–41)
Albumin: 2.9 g/dL — ABNORMAL LOW (ref 3.5–5.0)
Alkaline Phosphatase: 45 U/L (ref 38–126)
Anion gap: 9 (ref 5–15)
BUN: 96 mg/dL — ABNORMAL HIGH (ref 8–23)
CO2: 18 mmol/L — ABNORMAL LOW (ref 22–32)
Calcium: 8.2 mg/dL — ABNORMAL LOW (ref 8.9–10.3)
Chloride: 102 mmol/L (ref 98–111)
Creatinine, Ser: 14.05 mg/dL — ABNORMAL HIGH (ref 0.61–1.24)
GFR, Estimated: 4 mL/min — ABNORMAL LOW (ref >60)
Glucose, Bld: 117 mg/dL — ABNORMAL HIGH (ref 70–99)
Potassium: 5.1 mmol/L (ref 3.5–5.1)
Sodium: 129 mmol/L — ABNORMAL LOW (ref 135–145)
Total Bilirubin: 0.8 mg/dL (ref 0.3–1.2)
Total Protein: 6.2 g/dL — ABNORMAL LOW (ref 6.5–8.1)

## 2023-01-23 LAB — URINALYSIS, W/ REFLEX TO CULTURE (INFECTION SUSPECTED)
Bilirubin Urine: NEGATIVE
Glucose, UA: NEGATIVE mg/dL
Ketones, ur: NEGATIVE mg/dL
Nitrite: NEGATIVE
Protein, ur: NEGATIVE mg/dL
RBC / HPF: >50 RBC/hpf (ref 0–5)
Specific Gravity, Urine: 1.01 (ref 1.005–1.030)
WBC, UA: >50 WBC/hpf (ref 0–5)
pH: 5 (ref 5.0–8.0)

## 2023-01-23 LAB — LACTIC ACID, PLASMA
Lactic Acid, Venous: 1.5 mmol/L (ref 0.5–1.9)
Lactic Acid, Venous: 2.1 mmol/L (ref 0.5–1.9)

## 2023-01-23 LAB — APTT: aPTT: 36 seconds (ref 24–36)

## 2023-01-23 LAB — PROTIME-INR
INR: 1.8 — ABNORMAL HIGH (ref 0.8–1.2)
Prothrombin Time: 20.6 seconds — ABNORMAL HIGH (ref 11.4–15.2)

## 2023-01-23 MED ORDER — VANCOMYCIN VARIABLE DOSE PER UNSTABLE RENAL FUNCTION (PHARMACIST DOSING): Status: DC

## 2023-01-23 MED ORDER — LACTATED RINGERS IV SOLN: INTRAVENOUS | Status: DC

## 2023-01-23 MED ORDER — ACETAMINOPHEN 500 MG PO TABS
1000.0000 mg | ORAL_TABLET | Freq: Once | ORAL | Status: AC
  Administered 2023-01-23: 1000 mg via ORAL
  Filled 2023-01-23: qty 2

## 2023-01-23 MED ORDER — VANCOMYCIN HCL 2000 MG/400ML IV SOLN
2000.0000 mg | Freq: Once | INTRAVENOUS | Status: AC
  Administered 2023-01-23: 2000 mg via INTRAVENOUS
  Filled 2023-01-23: qty 400

## 2023-01-23 MED ORDER — SODIUM CHLORIDE 0.9 % IV SOLN
2.0000 g | Freq: Once | INTRAVENOUS | Status: AC
  Administered 2023-01-23: 2 g via INTRAVENOUS
  Filled 2023-01-23: qty 20

## 2023-01-23 MED ORDER — LACTATED RINGERS IV BOLUS
1000.0000 mL | Freq: Once | INTRAVENOUS | Status: AC
  Administered 2023-01-23: 1000 mL via INTRAVENOUS

## 2023-01-23 MED ORDER — LACTATED RINGERS IV BOLUS
30.0000 mL/kg | Freq: Once | INTRAVENOUS | Status: AC
  Administered 2023-01-23: 2520 mL via INTRAVENOUS

## 2023-01-23 NOTE — Subjective & Objective (Signed)
Pt comes in with increased fatigue  Had hip replaced on 01/06/23 Noted be be febrile up 104.0 Otherwise no CP no N/V/D Pt has chronic autoimmune hepatitis on Imuran, cervical myelopathy status post cervical surgery,  Reports he is now unable to walk due to generalized weakness that is worse in his legs Reports urinary incontinence No cough/SOB no CP

## 2023-01-23 NOTE — ED Notes (Signed)
Patient transported to MRI 

## 2023-01-23 NOTE — ED Notes (Signed)
Patient complains of generalized weakness. Patient states he has not had N/V/D,  chest pain, SOB. Patient states he has been too weak to go to the bathroom, so has been wearing a depends.

## 2023-01-23 NOTE — ED Provider Notes (Signed)
[facility] Provider Note  CSN: [id number] Arrival date & time: 01/23/23 1501  Chief Complaint(s) Weakness (L Hip Replacement)  HPI Alex West is a 67 y.o. male with history of autoimmune hepatitis on Imuran, cervical myelopathy status post cervical surgery, recent left hip replacement presenting to the emergency department with weakness.  Patient reports in the past few days he has been so weak he has not been able to get up and walk.  He reports that he feels weakness everywhere but it is worse in his legs.  He also reports for the past week he has had new bowel incontinence and difficulty urinating.  He denies having this before.  He does not feel any urge to go to the bathroom he just becomes incontinent.  He has not noticed any fevers or chills.  He called the paramedics, found he was febrile and given Tylenol.  He denies any other symptoms such as cough, runny nose, sore throat, abdominal pain, difficulty breathing, chest pain, painful urination.    Past Medical History Past Medical History:  Diagnosis Date   Arthritis    BPH (benign prostatic hyperplasia)    Colon polyp    ED (erectile dysfunction)    Elevated liver enzymes    Gallstones    Hepatitis    History of anemia    Hypercholesterolemia    Hypothyroidism    Mild tricuspid regurgitation    Mitral valve regurgitation    Thyroid disease    Vertigo    Patient Active Problem List   Diagnosis Date Noted   AKI (acute kidney injury) ([place]) 01/23/2023   Arthritis of left hip 01/01/2023   Paresthesia 08/21/2022   Stenosis of cervical spine with myelopathy ([place]) 07/23/2022   Gait abnormality 06/18/2022   Cervical myelopathy ([place]) 06/17/2022   Weakness 06/06/2022   Neck pain 06/06/2022   OA (osteoarthritis) of hip 07/24/2021   Immunosuppressed status ([place]) 09/05/2014   Hepatitis, autoimmune ([place]) 09/05/2014   Home Medication(s) Prior to Admission medications    Medication Sig Start Date End Date Taking? Authorizing Provider  aspirin EC 81 MG tablet Take 1 tablet (81 mg total) by mouth 2 (two) times daily. 01/06/23  Yes Leighton Parody, PA-C  azaTHIOprine (IMURAN) 50 MG tablet Take 75 mg by mouth daily. 05/28/21  Yes [provider]  levothyroxine (SYNTHROID) 200 MCG tablet Take 200 mcg by mouth daily before breakfast.   Yes [provider]  Multiple Vitamins-Minerals (CENTRUM ADULT PO) Take 1 tablet by mouth daily.   Yes [provider]  oxyCODONE-acetaminophen (PERCOCET/ROXICET) 5-325 MG tablet Take 1 tablet by mouth every 4 (four) hours as needed for severe pain. 01/06/23  Yes Joanell Rising K, PA-C  tiZANidine (ZANAFLEX) 2 MG tablet Take 1 tablet (2 mg total) by mouth every 6 (six) hours as needed for muscle spasms. 01/06/23  Yes Leighton Parody, PA-C  Past Surgical History Past Surgical History:  Procedure Laterality Date   ANTERIOR CERVICAL DECOMP/DISCECTOMY FUSION N/A 07/23/2022   Procedure: Anterior Cervical Discectomy and Fusion Cervical Three-Four/Four-Five/Five-Six;  Surgeon: Vallarie Mare, MD;  Location: [facility];  Service: Neurosurgery;  Laterality: N/A;  3C   FLEXIBLE SIGMOIDOSCOPY     TOTAL HIP ARTHROPLASTY Left 01/06/2023   Procedure: LEFT TOTAL HIP ARTHROPLASTY ANTERIOR APPROACH;  Surgeon: Frederik Pear, MD;  Location: [facility];  Service: Orthopedics;  Laterality: Left;   Family History Family History  Problem Relation Age of Onset   Cancer Mother        pancreatic   Cancer Father        stomach   Kidney failure Brother    Other Brother        neck tumor   Hypertension Brother    Diabetes Brother    Lupus Daughter    Hypertension Sister    Other Son        boating accident    Social History Social History   Tobacco Use   Smoking status: Former   Smokeless tobacco:  Never  Scientific laboratory technician Use: Never used  Substance Use Topics   Alcohol use: Yes    Comment: occ   Drug use: No   Allergies Tadalafil  Review of Systems Review of Systems  All other systems reviewed and are negative.   Physical Exam Vital Signs  I have reviewed the triage vital signs BP (!) 82/58   Pulse 93   Temp (!) 101.1 F (38.4 C) (Oral)   Resp (!) 23   SpO2 98%  Physical Exam Vitals and nursing note reviewed.  Constitutional:      General: He is not in acute distress.    Appearance: Normal appearance. He is diaphoretic.  HENT:     Mouth/Throat:     Mouth: Mucous membranes are moist.  Eyes:     Conjunctiva/sclera: Conjunctivae normal.  Cardiovascular:     Rate and Rhythm: Normal rate and regular rhythm.  Pulmonary:     Effort: Pulmonary effort is normal. No respiratory distress.     Breath sounds: Normal breath sounds.  Abdominal:     General: Abdomen is flat.     Palpations: Abdomen is soft.     Tenderness: There is no abdominal tenderness.  Musculoskeletal:     Right lower leg: No edema.     Left lower leg: Edema (no tenderness, no erythema) present.     Comments: Passive range of motion of the bilateral hips without significant discomfort.  No midline C, T, L-spine tenderness  Skin:    General: Skin is warm.     Capillary Refill: Capillary refill takes less than 2 seconds.  Neurological:     Mental Status: He is alert and oriented to person, place, and time. Mental status is at baseline.     Comments: Strength 5 out of 5 in the bilateral upper extremities.  Unable to lift bilateral lower extremities antigravity.  No sensory deficit to light touch throughout  Psychiatric:        Mood and Affect: Mood normal.        Behavior: Behavior normal.     ED Results and Treatments Labs (all labs ordered are listed, but only abnormal results are displayed) Labs Reviewed  LACTIC ACID, PLASMA - Abnormal; Notable for the following components:      Result  Value   Lactic Acid, Venous 2.1 (*)    All other  components within normal limits  COMPREHENSIVE METABOLIC PANEL - Abnormal; Notable for the following components:   Sodium 129 (*)    CO2 18 (*)    Glucose, Bld 117 (*)    BUN 96 (*)    Creatinine, Ser 14.05 (*)    Calcium 8.2 (*)    Total Protein 6.2 (*)    Albumin 2.9 (*)    GFR, Estimated 4 (*)    All other components within normal limits  CBC WITH DIFFERENTIAL/PLATELET - Abnormal; Notable for the following components:   WBC 14.2 (*)    RBC 2.54 (*)    Hemoglobin 8.4 (*)    HCT 24.1 (*)    Platelets 116 (*)    Neutro Abs 12.2 (*)    Lymphs Abs 0.5 (*)    Abs Immature Granulocytes 0.37 (*)    All other components within normal limits  PROTIME-INR - Abnormal; Notable for the following components:   Prothrombin Time 20.6 (*)    INR 1.8 (*)    All other components within normal limits  URINALYSIS, W/ REFLEX TO CULTURE (INFECTION SUSPECTED) - Abnormal; Notable for the following components:   APPearance CLOUDY (*)    Hgb urine dipstick LARGE (*)    Leukocytes,Ua LARGE (*)    Bacteria, UA MANY (*)    All other components within normal limits  I-STAT CHEM 8, ED - Abnormal; Notable for the following components:   Sodium 134 (*)    BUN 92 (*)    Creatinine, Ser 12.40 (*)    Glucose, Bld 104 (*)    Calcium, Ion 1.11 (*)    TCO2 17 (*)    Hemoglobin 9.2 (*)    HCT 27.0 (*)    All other components within normal limits  RESP PANEL BY RT-PCR (RSV, FLU A&B, COVID)  RVPGX2  CULTURE, BLOOD (ROUTINE X 2)  CULTURE, BLOOD (ROUTINE X 2)  URINE CULTURE  LACTIC ACID, PLASMA  APTT  AMMONIA  BLOOD GAS, VENOUS  CK  CREATININE, URINE, RANDOM  OSMOLALITY, URINE  OSMOLALITY  SODIUM, URINE, RANDOM  TSH  VITAMIN B12  FOLATE  IRON AND TIBC  FERRITIN  RETICULOCYTES                                                                                                                          Radiology MR LUMBAR SPINE WO CONTRAST  Result  Date: 01/23/2023 CLINICAL DATA:  Back pain EXAM: MRI CERVICAL, THORACIC AND LUMBAR SPINE WITHOUT CONTRAST TECHNIQUE: Multiplanar and multiecho pulse sequences of the cervical spine, to include the craniocervical junction and cervicothoracic junction, and thoracic and lumbar spine, were obtained without intravenous contrast. COMPARISON:  09/18/2022 FINDINGS: MRI CERVICAL SPINE FINDINGS Alignment: Physiologic. Vertebrae: C3-6 ACDF.  No acute abnormality. Cord: Myelomalacia at the C3-4 level, unchanged. Posterior Fossa, vertebral arteries, paraspinal tissues: Negative. Disc levels: C1-2: Unremarkable. C2-3: Mild facet hypertrophy with small disc bulge and uncinate spurring. There is no spinal canal stenosis. Bilateral neural foraminal  stenosis. C3-4: ACDF. Unchanged small disc bulge. Unchanged severe spinal canal stenosis. No neural foraminal stenosis. C4-5: ACDF. Mild facet hypertrophy. There is no spinal canal stenosis. No neural foraminal stenosis. C5-6: ACDF with small disc bulge and uncinate spurring. Unchanged mild spinal canal stenosis. Unchanged moderate bilateral neural foraminal stenosis. C6-7: Small disc bulge. There is no spinal canal stenosis. No neural foraminal stenosis. C7-T1: Normal disc space and facet joints. There is no spinal canal stenosis. No neural foraminal stenosis. MRI THORACIC SPINE FINDINGS Alignment:  Physiologic. Vertebrae: No fracture, evidence of discitis, or bone lesion. Modic type 2 endplate signal changes at L4-5, unchanged. Cord:  Normal signal and morphology. Paraspinal and other soft tissues: Negative. Disc levels: There is mild multilevel degenerative disc disease, greatest at T9-10 where there is mild spinal canal stenosis. No stenosis at any other level. MRI LUMBAR SPINE FINDINGS Segmentation:  Standard. Alignment:  Physiologic. Vertebrae:  No fracture, evidence of discitis, or bone lesion. Conus medullaris and cauda equina: Conus extends to the T12 level. Conus and cauda equina  appear normal. Paraspinal and other soft tissues: Negative Disc levels: L1-L2: Normal disc space and facet joints. No spinal canal stenosis. No neural foraminal stenosis. L2-L3: Small disc bulge, unchanged. Unchanged mild spinal canal stenosis. No neural foraminal stenosis. L3-L4: Unchanged small disc bulge. Mild spinal canal stenosis. No neural foraminal stenosis. L4-L5: Unchanged intermediate sized disc bulge, right asymmetric. No spinal canal stenosis. Unchanged mild right neural foraminal stenosis. L5-S1: Normal disc space and facet joints. No spinal canal stenosis. No neural foraminal stenosis. Visualized sacrum: Normal. IMPRESSION: 1. C3-6 ACDF with unchanged severe spinal canal stenosis at C3-4 and focal myelomalacia. 2. Unchanged moderate bilateral C5-6 neural foraminal stenosis. 3. Mild multilevel thoracic degenerative disc disease with mild spinal canal stenosis at T9-10. 4. Unchanged mild spinal canal stenosis at L2-3, L3-4 and L4-5. 5. Unchanged mild right L4-5 neural foraminal stenosis. Electronically Signed   By: Ulyses Jarred M.D.   On: 01/23/2023 22:33   MR THORACIC SPINE WO CONTRAST  Result Date: 01/23/2023 CLINICAL DATA:  Back pain EXAM: MRI CERVICAL, THORACIC AND LUMBAR SPINE WITHOUT CONTRAST TECHNIQUE: Multiplanar and multiecho pulse sequences of the cervical spine, to include the craniocervical junction and cervicothoracic junction, and thoracic and lumbar spine, were obtained without intravenous contrast. COMPARISON:  09/18/2022 FINDINGS: MRI CERVICAL SPINE FINDINGS Alignment: Physiologic. Vertebrae: C3-6 ACDF.  No acute abnormality. Cord: Myelomalacia at the C3-4 level, unchanged. Posterior Fossa, vertebral arteries, paraspinal tissues: Negative. Disc levels: C1-2: Unremarkable. C2-3: Mild facet hypertrophy with small disc bulge and uncinate spurring. There is no spinal canal stenosis. Bilateral neural foraminal stenosis. C3-4: ACDF. Unchanged small disc bulge. Unchanged severe spinal canal  stenosis. No neural foraminal stenosis. C4-5: ACDF. Mild facet hypertrophy. There is no spinal canal stenosis. No neural foraminal stenosis. C5-6: ACDF with small disc bulge and uncinate spurring. Unchanged mild spinal canal stenosis. Unchanged moderate bilateral neural foraminal stenosis. C6-7: Small disc bulge. There is no spinal canal stenosis. No neural foraminal stenosis. C7-T1: Normal disc space and facet joints. There is no spinal canal stenosis. No neural foraminal stenosis. MRI THORACIC SPINE FINDINGS Alignment:  Physiologic. Vertebrae: No fracture, evidence of discitis, or bone lesion. Modic type 2 endplate signal changes at L4-5, unchanged. Cord:  Normal signal and morphology. Paraspinal and other soft tissues: Negative. Disc levels: There is mild multilevel degenerative disc disease, greatest at T9-10 where there is mild spinal canal stenosis. No stenosis at any other level. MRI LUMBAR SPINE FINDINGS Segmentation:  Standard. Alignment:  Physiologic. Vertebrae:  No fracture, evidence of discitis, or bone lesion. Conus medullaris and cauda equina: Conus extends to the T12 level. Conus and cauda equina appear normal. Paraspinal and other soft tissues: Negative Disc levels: L1-L2: Normal disc space and facet joints. No spinal canal stenosis. No neural foraminal stenosis. L2-L3: Small disc bulge, unchanged. Unchanged mild spinal canal stenosis. No neural foraminal stenosis. L3-L4: Unchanged small disc bulge. Mild spinal canal stenosis. No neural foraminal stenosis. L4-L5: Unchanged intermediate sized disc bulge, right asymmetric. No spinal canal stenosis. Unchanged mild right neural foraminal stenosis. L5-S1: Normal disc space and facet joints. No spinal canal stenosis. No neural foraminal stenosis. Visualized sacrum: Normal. IMPRESSION: 1. C3-6 ACDF with unchanged severe spinal canal stenosis at C3-4 and focal myelomalacia. 2. Unchanged moderate bilateral C5-6 neural foraminal stenosis. 3. Mild multilevel  thoracic degenerative disc disease with mild spinal canal stenosis at T9-10. 4. Unchanged mild spinal canal stenosis at L2-3, L3-4 and L4-5. 5. Unchanged mild right L4-5 neural foraminal stenosis. Electronically Signed   By: Ulyses Jarred M.D.   On: 01/23/2023 22:33   MR Cervical Spine Wo Contrast  Result Date: 01/23/2023 CLINICAL DATA:  Back pain EXAM: MRI CERVICAL, THORACIC AND LUMBAR SPINE WITHOUT CONTRAST TECHNIQUE: Multiplanar and multiecho pulse sequences of the cervical spine, to include the craniocervical junction and cervicothoracic junction, and thoracic and lumbar spine, were obtained without intravenous contrast. COMPARISON:  09/18/2022 FINDINGS: MRI CERVICAL SPINE FINDINGS Alignment: Physiologic. Vertebrae: C3-6 ACDF.  No acute abnormality. Cord: Myelomalacia at the C3-4 level, unchanged. Posterior Fossa, vertebral arteries, paraspinal tissues: Negative. Disc levels: C1-2: Unremarkable. C2-3: Mild facet hypertrophy with small disc bulge and uncinate spurring. There is no spinal canal stenosis. Bilateral neural foraminal stenosis. C3-4: ACDF. Unchanged small disc bulge. Unchanged severe spinal canal stenosis. No neural foraminal stenosis. C4-5: ACDF. Mild facet hypertrophy. There is no spinal canal stenosis. No neural foraminal stenosis. C5-6: ACDF with small disc bulge and uncinate spurring. Unchanged mild spinal canal stenosis. Unchanged moderate bilateral neural foraminal stenosis. C6-7: Small disc bulge. There is no spinal canal stenosis. No neural foraminal stenosis. C7-T1: Normal disc space and facet joints. There is no spinal canal stenosis. No neural foraminal stenosis. MRI THORACIC SPINE FINDINGS Alignment:  Physiologic. Vertebrae: No fracture, evidence of discitis, or bone lesion. Modic type 2 endplate signal changes at L4-5, unchanged. Cord:  Normal signal and morphology. Paraspinal and other soft tissues: Negative. Disc levels: There is mild multilevel degenerative disc disease, greatest  at T9-10 where there is mild spinal canal stenosis. No stenosis at any other level. MRI LUMBAR SPINE FINDINGS Segmentation:  Standard. Alignment:  Physiologic. Vertebrae:  No fracture, evidence of discitis, or bone lesion. Conus medullaris and cauda equina: Conus extends to the T12 level. Conus and cauda equina appear normal. Paraspinal and other soft tissues: Negative Disc levels: L1-L2: Normal disc space and facet joints. No spinal canal stenosis. No neural foraminal stenosis. L2-L3: Small disc bulge, unchanged. Unchanged mild spinal canal stenosis. No neural foraminal stenosis. L3-L4: Unchanged small disc bulge. Mild spinal canal stenosis. No neural foraminal stenosis. L4-L5: Unchanged intermediate sized disc bulge, right asymmetric. No spinal canal stenosis. Unchanged mild right neural foraminal stenosis. L5-S1: Normal disc space and facet joints. No spinal canal stenosis. No neural foraminal stenosis. Visualized sacrum: Normal. IMPRESSION: 1. C3-6 ACDF with unchanged severe spinal canal stenosis at C3-4 and focal myelomalacia. 2. Unchanged moderate bilateral C5-6 neural foraminal stenosis. 3. Mild multilevel thoracic degenerative disc disease with mild spinal canal stenosis at T9-10. 4. Unchanged  mild spinal canal stenosis at L2-3, L3-4 and L4-5. 5. Unchanged mild right L4-5 neural foraminal stenosis. Electronically Signed   By: Ulyses Jarred M.D.   On: 01/23/2023 22:33   CT ABDOMEN PELVIS WO CONTRAST  Result Date: 01/23/2023 CLINICAL DATA:  Kidney failure, acute renal failure EXAM: CT ABDOMEN AND PELVIS WITHOUT CONTRAST TECHNIQUE: Multidetector CT imaging of the abdomen and pelvis was performed following the standard protocol without IV contrast. RADIATION DOSE REDUCTION: This exam was performed according to the departmental dose-optimization program which includes automated exposure control, adjustment of the mA and/or kV according to patient size and/or use of iterative reconstruction technique.  COMPARISON:  CT 03/26/2018. FINDINGS: Lower chest: No acute abnormality. Hepatobiliary: No focal liver abnormality is seen. Decompressed gallbladder with cholelithiasis. Pancreas: Unremarkable. No pancreatic ductal dilatation or surrounding inflammatory changes. Spleen: Normal in size without focal abnormality. Adrenals/Urinary Tract: Adrenal glands are unremarkable. Moderate bilateral hydronephrosis. No nephroureterolithiasis. Mild perinephric stranding bilaterally. Markedly dilated urinary bladder. Stomach/Bowel: The stomach is within normal limits. There is no evidence of bowel obstruction.The appendix is normal. Vascular/Lymphatic: Aortoiliac atherosclerosis. No AAA. No lymphadenopathy. Retroaortic left renal vein. Reproductive: Enlarged prostate gland. Bilateral scrotal varicoceles. Other: Small fat containing umbilical hernia. No abdominopelvic ascites. Musculoskeletal: No acute osseous abnormality. No suspicious osseous lesion. Prior left hip arthroplasty. Normal alignment. Moderate right hip osteoarthritis. Degenerative changes of the spine most prominent at L4-L5. IMPRESSION: Findings suggestive of bladder outlet obstruction due to an enlarged prostate gland. Markedly distended urinary bladder and moderate bilateral hydroureteronephrosis. No other acute findings in the abdomen or pelvis. Additional chronic findings as described above. Electronically Signed   By: Maurine Simmering M.D.   On: 01/23/2023 18:46   DG Hip Unilat With Pelvis 2-3 Views Left  Result Date: 01/23/2023 CLINICAL DATA:  Sepsis, surgery EXAM: DG HIP (WITH OR WITHOUT PELVIS) 3V LEFT COMPARISON:  08/14/2021. FINDINGS: Status post left total hip arthroplasty. No periprosthetic lucency to suggest loosening. No acute fracture, dislocation or subluxation. Pelvic ring intact. IMPRESSION: Unremarkable appearance status post left total hip arthroplasty. Electronically Signed   By: Sammie Bench M.D.   On: 01/23/2023 17:29   DG Chest 1  View  Result Date: 01/23/2023 CLINICAL DATA:  Recent surgery, sepsis EXAM: CHEST  1 VIEW COMPARISON:  None Available. FINDINGS: The heart is enlarged. The lungs are clear. There is no pleural effusion or pneumothorax. No acute fractures are seen. IMPRESSION: Cardiomegaly. No acute pulmonary process. Electronically Signed   By: Ronney Asters M.D.   On: 01/23/2023 17:29   VAS [place] LOWER EXTREMITY VENOUS (DVT) (7a-7p)  Result Date: 01/23/2023  Lower Venous DVT Study Patient Name:  Alex West  Date of Exam:   01/23/2023 Medical Rec #: [id number]             Accession #:    [id number] Date of Birth: [birth year]             Patient Gender: M Patient Age:   55 years Exam Location:  [facility] Procedure:      VAS [place] LOWER EXTREMITY VENOUS (DVT) Referring Phys: Garnette Gunner --------------------------------------------------------------------------------  Indications: Edema.  Risk Factors: Surgery. Limitations: Poor ultrasound/tissue interface. Comparison Study: No prior studies. Performing Technologist: Oliver Hum RVT  Examination Guidelines: A complete evaluation includes B-mode imaging, spectral Doppler, color Doppler, and power Doppler as needed of all accessible portions of each vessel. Bilateral testing is considered an integral part of a complete examination. Limited examinations for reoccurring indications may be performed as noted.  The reflux portion of the exam is performed with the patient in reverse Trendelenburg.  +-----+---------------+---------+-----------+----------+--------------+ RIGHTCompressibilityPhasicitySpontaneityPropertiesThrombus Aging +-----+---------------+---------+-----------+----------+--------------+ CFV  Full           Yes      Yes                                 +-----+---------------+---------+-----------+----------+--------------+   +---------+---------------+---------+-----------+----------+--------------+ LEFT      CompressibilityPhasicitySpontaneityPropertiesThrombus Aging +---------+---------------+---------+-----------+----------+--------------+ CFV      Full           Yes      Yes                                 +---------+---------------+---------+-----------+----------+--------------+ SFJ      Full                                                        +---------+---------------+---------+-----------+----------+--------------+ FV Prox  Full                                                        +---------+---------------+---------+-----------+----------+--------------+ FV Mid   Full           Yes      Yes                                 +---------+---------------+---------+-----------+----------+--------------+ FV DistalFull                                                        +---------+---------------+---------+-----------+----------+--------------+ PFV      Full                                                        +---------+---------------+---------+-----------+----------+--------------+ POP      Full           Yes      Yes                                 +---------+---------------+---------+-----------+----------+--------------+ PTV      Full                                                        +---------+---------------+---------+-----------+----------+--------------+ PERO     Full                                                        +---------+---------------+---------+-----------+----------+--------------+  Summary: RIGHT: - No evidence of common femoral vein obstruction.  LEFT: - There is no evidence of deep vein thrombosis in the lower extremity.  - No cystic structure found in the popliteal fossa.  *See table(s) above for measurements and observations.    Preliminary     Pertinent labs & imaging results that were available during my care of the patient were reviewed by me and considered in my medical decision making (see MDM for  details).  Medications Ordered in ED Medications  vancomycin variable dose per unstable renal function (pharmacist dosing) (has no administration in time range)  lactated ringers infusion ( Intravenous New Bag/Given 01/23/23 2258)  lactated ringers bolus 2,520 mL (0 mLs Intravenous Stopped 01/23/23 1842)  cefTRIAXone (ROCEPHIN) 2 g in sodium chloride 0.9 % 100 mL IVPB (0 g Intravenous Stopped 01/23/23 1823)  vancomycin (VANCOREADY) IVPB 2000 mg/400 mL (2,000 mg Intravenous New Bag/Given 01/23/23 1837)  acetaminophen (TYLENOL) tablet 1,000 mg (1,000 mg Oral Given 01/23/23 1927)  lactated ringers bolus 1,000 mL (1,000 mLs Intravenous New Bag/Given 01/23/23 2326)                                                                                                                                     Procedures .Critical Care  Performed by: Cristie Hem, MD Authorized by: Cristie Hem, MD   Critical care provider statement:    Critical care time (minutes):  30   Critical care was necessary to treat or prevent imminent or life-threatening deterioration of the following conditions:  Sepsis, circulatory failure and renal failure   Critical care was time spent personally by me on the following activities:  Development of treatment plan with patient or surrogate, discussions with consultants, evaluation of patient's response to treatment, examination of patient, ordering and review of laboratory studies, ordering and review of radiographic studies, ordering and performing treatments and interventions, pulse oximetry, re-evaluation of patient's condition and review of old charts   Care discussed with: admitting provider     (including critical care time)  Medical Decision Making / ED Course   MDM:  67 year old male presenting to the emergency department with generalized weakness.  Patient febrile, already received Tylenol with EMS.  Also noted to be slightly hypotensive.  Not tachycardic.  Exam  notable for inability to lift legs off bed.  Primary concern for occult infectious process.  Given his worsening lower extremity weakness and new incontinence, concern for possibility of spinal infection.  Will obtain MRI to evaluate.  Patient having no other obvious focal symptoms to suggest alternative infectious source, no URI symptoms to suggest COVID/flu, no cough or focal lung sounds to suggest pneumonia.  Will check chest x-ray.  No abdominal tenderness, nausea, vomiting to suggest intra-abdominal infectious source.  No urinary symptoms to suggest urinary infection.  Did have recent hip replacement surgery, but denies hip pain and able to passively range the hip without  significant discomfort, lower concern for postsurgical infection.  Does have some left leg swelling, likely due to recent hip replacement will check ultrasound though.  No chest pain, shortness of breath to suggest pulmonary embolism.  Clinical Course as of 01/23/23 2330  Thu Jan 23, 2023  1918 MR Cervical Spine Wo Contrast Discussed with MRI. Patient on the list for MRI. Hopefully within 45 minutes. Logistical issues with MRI as one machine is not working.  [WS]  1943 Unfortunately due to acute renal failure unable to use contrast enhanced MRI [WS]  2327 Ultimately workup revealing of urinary tract infection likely due to bladder outlet obstruction from prostate.  Foley catheter placed with 2000 cc of urine.  CT abdomen without evidence of nephrolithiasis.  MRIs of the C, T, L-spine unrevealing for spinal cord compression, although without contrast, low concern for occult infection, patient is not really having back pain.  Left lower extremity ultrasound negative for DVT.  Chest x-ray without pneumonia.  Patient admitted to medicine.  Will give additional liter of fluid given low blood pressure.  MAP greater than 65. [WS]    Clinical Course User Index [WS] Cristie Hem, MD     Additional history obtained: -Additional  history obtained from family and ems -External records from outside source obtained and reviewed including: Chart review including previous notes, labs, imaging, consultation notes including records from recent hip replacement   Lab Tests: -I ordered, reviewed, and interpreted labs.   The pertinent results include:   Labs Reviewed  LACTIC ACID, PLASMA - Abnormal; Notable for the following components:      Result Value   Lactic Acid, Venous 2.1 (*)    All other components within normal limits  COMPREHENSIVE METABOLIC PANEL - Abnormal; Notable for the following components:   Sodium 129 (*)    CO2 18 (*)    Glucose, Bld 117 (*)    BUN 96 (*)    Creatinine, Ser 14.05 (*)    Calcium 8.2 (*)    Total Protein 6.2 (*)    Albumin 2.9 (*)    GFR, Estimated 4 (*)    All other components within normal limits  CBC WITH DIFFERENTIAL/PLATELET - Abnormal; Notable for the following components:   WBC 14.2 (*)    RBC 2.54 (*)    Hemoglobin 8.4 (*)    HCT 24.1 (*)    Platelets 116 (*)    Neutro Abs 12.2 (*)    Lymphs Abs 0.5 (*)    Abs Immature Granulocytes 0.37 (*)    All other components within normal limits  PROTIME-INR - Abnormal; Notable for the following components:   Prothrombin Time 20.6 (*)    INR 1.8 (*)    All other components within normal limits  URINALYSIS, W/ REFLEX TO CULTURE (INFECTION SUSPECTED) - Abnormal; Notable for the following components:   APPearance CLOUDY (*)    Hgb urine dipstick LARGE (*)    Leukocytes,Ua LARGE (*)    Bacteria, UA MANY (*)    All other components within normal limits  I-STAT CHEM 8, ED - Abnormal; Notable for the following components:   Sodium 134 (*)    BUN 92 (*)    Creatinine, Ser 12.40 (*)    Glucose, Bld 104 (*)    Calcium, Ion 1.11 (*)    TCO2 17 (*)    Hemoglobin 9.2 (*)    HCT 27.0 (*)    All other components within normal limits  RESP PANEL BY RT-PCR (RSV, FLU  A&B, COVID)  RVPGX2  CULTURE, BLOOD (ROUTINE X 2)  CULTURE, BLOOD  (ROUTINE X 2)  URINE CULTURE  LACTIC ACID, PLASMA  APTT  AMMONIA  BLOOD GAS, VENOUS  CK  CREATININE, URINE, RANDOM  OSMOLALITY, URINE  OSMOLALITY  SODIUM, URINE, RANDOM  TSH  VITAMIN B12  FOLATE  IRON AND TIBC  FERRITIN  RETICULOCYTES    Notable for signs of UTI, acute renal failure, leukocytosis  EKG   EKG Interpretation  Date/Time:  Thursday January 23 2023 16:41:18 EST Ventricular Rate:  98 PR Interval:  158 QRS Duration: 119 QT Interval:  341 QTC Calculation: 436 R Axis:   -43 Text Interpretation: Sinus rhythm Nonspecific IVCD with LAD Confirmed by Garnette Gunner [phone]) on 01/23/2023 4:56:24 PM         Imaging Studies ordered: I ordered imaging studies including MRI C/T/L spine, CT KUB, XR chest On my interpretation imaging demonstrates no pneumonia, no spinal infection. Very large bladder I independently visualized and interpreted imaging. I agree with the radiologist interpretation   Medicines ordered and prescription drug management: Meds ordered this encounter  Medications   lactated ringers bolus 2,520 mL   cefTRIAXone (ROCEPHIN) 2 g in sodium chloride 0.9 % 100 mL IVPB    Order Specific Question:   Antibiotic Indication:    Answer:   Bacteremia    Order Specific Question:   Other Indication:    Answer:   sepsis   vancomycin (VANCOREADY) IVPB 2000 mg/400 mL    Order Specific Question:   Indication:    Answer:   Sepsis   vancomycin variable dose per unstable renal function (pharmacist dosing)   acetaminophen (TYLENOL) tablet 1,000 mg   lactated ringers infusion   lactated ringers bolus 1,000 mL    -I have reviewed the patients home medicines and have made adjustments as needed   Consultations Obtained: I requested consultation with the hospitalist,  and discussed lab and imaging findings as well as pertinent plan - they recommend: admission   Cardiac Monitoring: The patient was maintained on a cardiac monitor.  I personally viewed and  interpreted the cardiac monitored which showed an underlying rhythm of: NSR  Social Determinants of Health:  Diagnosis or treatment significantly limited by social determinants of health: obesity   Reevaluation: After the interventions noted above, I reevaluated the patient and found that they have improved  Co morbidities that complicate the patient evaluation  Past Medical History:  Diagnosis Date   Arthritis    BPH (benign prostatic hyperplasia)    Colon polyp    ED (erectile dysfunction)    Elevated liver enzymes    Gallstones    Hepatitis    History of anemia    Hypercholesterolemia    Hypothyroidism    Mild tricuspid regurgitation    Mitral valve regurgitation    Thyroid disease    Vertigo       Dispostion: Disposition decision including need for hospitalization was considered, and patient admitted to the hospital.    Final Clinical Impression(s) / ED Diagnoses Final diagnoses:  Acute renal failure, unspecified acute renal failure type ([place])  Sepsis, due to unspecified organism, unspecified whether acute organ dysfunction present [facility])  Lower urinary tract infectious disease  Weakness     This chart was dictated using voice recognition software.  Despite best efforts to proofread,  errors can occur which can change the documentation meaning.    Cristie Hem, MD 01/23/23 2330

## 2023-01-23 NOTE — ED Notes (Signed)
Patient turned, cleaned, and brief changed.

## 2023-01-23 NOTE — ED Triage Notes (Signed)
Pt BIB [facility] from home due to increased weakness since last night.  Pt had left hip replacement 01/06/23.  Pt unable to stand and bear any weight.  Pt denies any chest pain, n/v, dizziness or SHOB.   VS BP 113/63, HR 94, SpO2 94%, CBG 154.  Temp of 104.0.  [facility] gave '600mg'$  of Tylenol en route.

## 2023-01-23 NOTE — ED Notes (Signed)
Patient transported to CT 

## 2023-01-23 NOTE — H&P (Signed)
Alex West ZOX:[id number] DOB: 1956/08/19 DOA: 01/23/2023     PCP: Lawerance Cruel, MD   Outpatient Specialists:     NEurology    Dr. Krista Blue    Patient arrived to ER on 01/23/23 at 1501 Referred by Attending Cristie Hem, MD   Patient coming from:    home Lives With family    Chief Complaint:   Chief Complaint  Patient presents with   Weakness    L Hip Replacement    HPI: Alex West is a 67 y.o. male with medical history significant of  autoimmune hepatitis, BPH, Anemia, hypothyroidism    Presented with   fatigue Pt comes in with increased fatigue  Had hip replaced on 01/06/23 Noted be be febrile up 104.0 Otherwise no CP no N/V/D Pt has chronic autoimmune hepatitis on Imuran, cervical myelopathy status post cervical surgery,  Reports he is now unable to walk due to generalized weakness that is worse in his legs Reports urinary incontinence No cough/SOB no CP    Does not smoke or drink  Denies any bleeding    Lab Results  Component Value Date   [place] 01/23/2023     Regarding pertinent Chronic problems:       Hypothyroidism:  on synthroid    Liver disease MELD 3.0: 26 at 01/23/2023  8:01 PM MELD-Na: 27 at 01/23/2023  8:01 PM Calculated from: Serum Creatinine: 12.40 mg/dL (Using max of 3 mg/dL) at 01/23/2023  8:01 PM Serum Sodium: 134 mmol/L at 01/23/2023  8:01 PM Total Bilirubin: 0.8 mg/dL (Using min of 1 mg/dL) at 01/23/2023  3:47 PM Serum Albumin: 2.9 g/dL at 01/23/2023  3:47 PM INR(ratio): 1.8 at 01/23/2023  3:47 PM Age at listing (hypothetical): 71 years Sex: Male at 01/23/2023  8:01 PM    BPH - on Flomax, Proscar       While in ER: Clinical Course as of 01/24/23 0036  Thu Jan 23, 2023  1918 MR Cervical Spine Wo Contrast Discussed with MRI. Patient on the list for MRI. Hopefully within 45 minutes. Logistical issues with MRI as one machine is not working.  [WS]  1943 Unfortunately due to acute renal failure unable to  use contrast enhanced MRI [WS]  2327 Ultimately workup revealing of urinary tract infection likely due to bladder outlet obstruction from prostate.  Foley catheter placed with 2000 cc of urine.  CT abdomen without evidence of nephrolithiasis.  MRIs of the C, T, L-spine unrevealing for spinal cord compression, although without contrast, low concern for occult infection, patient is not really having back pain.  Left lower extremity ultrasound negative for DVT.  Chest x-ray without pneumonia.  Patient admitted to medicine.  Will give additional liter of fluid given low blood pressure.  MAP greater than 65. [WS]    Clinical Course User Index [WS] Cristie Hem, MD     Ordered  MRI cervical/thoracic/ lumbar   1. C3-6 ACDF with unchanged severe spinal canal stenosis at C3-4 and focal myelomalacia.   Unchanged moderate bilateral C5-6 neural foraminal stenosis.   Mild multilevel thoracic degenerative disc disease with mild spinal canal stenosis at T9-10.   Unchanged mild spinal canal stenosis at L2-3, L3-4 and L4-5.   Unchanged mild right L4-5 neural foraminal stenosis.    CXR - cardimegaly  CTabd/pelvis - Findings suggestive of bladder outlet obstruction due to an enlarged prostate gland. Markedly distended urinary bladder and moderate bilateral hydroureteronephrosis.   No other acute findings in the abdomen  or pelvis.   Additional chronic findings as described above.    Following Medications were ordered in ER: Medications  vancomycin variable dose per unstable renal function (pharmacist dosing) (has no administration in time range)  lactated ringers infusion ( Intravenous New Bag/Given 01/23/23 2258)  lactated ringers bolus 2,520 mL (0 mLs Intravenous Stopped 01/23/23 1842)  cefTRIAXone (ROCEPHIN) 2 g in sodium chloride 0.9 % 100 mL IVPB (0 g Intravenous Stopped 01/23/23 1823)  vancomycin (VANCOREADY) IVPB 2000 mg/400 mL (2,000 mg Intravenous New Bag/Given 01/23/23 1837)  acetaminophen  (TYLENOL) tablet 1,000 mg (1,000 mg Oral Given 01/23/23 1927)    _______________________________________________________ ER Provider Called:  nephrology   Dr.[place]  They Recommend admit to medicine  Will see in AM     ED Triage Vitals  Enc Vitals Group     BP 01/23/23 1507 (!) 90/52     Pulse Rate 01/23/23 1507 99     Resp 01/23/23 1507 18     Temp 01/23/23 1507 (!) 101.7 F (38.7 C)     Temp Source 01/23/23 1507 Oral     SpO2 01/23/23 1507 99 %     Weight --      Height --      Head Circumference --      Peak Flow --      Pain Score 01/23/23 2156 0     Pain Loc --      Pain Edu? --      Excl. in [place]? --   TMAX(24)@     _________________________________________ Significant initial  Findings: Abnormal Labs Reviewed  LACTIC ACID, PLASMA - Abnormal; Notable for the following components:      Result Value   Lactic Acid, Venous 2.1 (*)    All other components within normal limits  COMPREHENSIVE METABOLIC PANEL - Abnormal; Notable for the following components:   Sodium 129 (*)    CO2 18 (*)    Glucose, Bld 117 (*)    BUN 96 (*)    Creatinine, Ser 14.05 (*)    Calcium 8.2 (*)    Total Protein 6.2 (*)    Albumin 2.9 (*)    GFR, Estimated 4 (*)    All other components within normal limits  CBC WITH DIFFERENTIAL/PLATELET - Abnormal; Notable for the following components:   WBC 14.2 (*)    RBC 2.54 (*)    Hemoglobin 8.4 (*)    HCT 24.1 (*)    Platelets 116 (*)    Neutro Abs 12.2 (*)    Lymphs Abs 0.5 (*)    Abs Immature Granulocytes 0.37 (*)    All other components within normal limits  PROTIME-INR - Abnormal; Notable for the following components:   Prothrombin Time 20.6 (*)    INR 1.8 (*)    All other components within normal limits  URINALYSIS, W/ REFLEX TO CULTURE (INFECTION SUSPECTED) - Abnormal; Notable for the following components:   APPearance CLOUDY (*)    Hgb urine dipstick LARGE (*)    Leukocytes,Ua LARGE (*)    Bacteria, UA MANY (*)    All other  components within normal limits  I-STAT CHEM 8, ED - Abnormal; Notable for the following components:   Sodium 134 (*)    BUN 92 (*)    Creatinine, Ser 12.40 (*)    Glucose, Bld 104 (*)    Calcium, Ion 1.11 (*)    TCO2 17 (*)    Hemoglobin 9.2 (*)    HCT 27.0 (*)  All other components within normal limits     _________________________ Troponin  ordered ECG: Ordered Personally reviewed and interpreted by me showing: HR : 98 Rhythm:Sinus rhythm Nonspecific IVCD with LAD QTC 436   ____________________ This patient meets SIRS Criteria and may be septic.     The recent clinical data is shown below. Vitals:   01/23/23 1930 01/23/23 2154 01/23/23 2230 01/23/23 2245  BP: (!) 155/144  (!) 87/62 (!) 83/47  Pulse: 99  100 95  Resp: (!) 30  (!) 24 (!) 25  Temp:  (!) 101.1 F (38.4 C)    TempSrc:  Oral    SpO2: 99%  99% 100%     WBC     Component Value Date/Time   WBC 14.2 (H) 01/23/2023 1547   LYMPHSABS 0.5 (L) 01/23/2023 1547   MONOABS 0.9 01/23/2023 1547   EOSABS 0.2 01/23/2023 1547   BASOSABS 0.1 01/23/2023 1547     Lactic Acid, Venous    Component Value Date/Time   LATICACIDVEN 2.1 (HH) 01/23/2023 1954     Procalcitonin   Ordered Lactic Acid, Venous    Component Value Date/Time   LATICACIDVEN 2.1 (HH) 01/23/2023 1954      UA evidence of UTI      Urine analysis:    Component Value Date/Time   COLORURINE YELLOW 01/23/2023 1840   APPEARANCEUR CLOUDY (A) 01/23/2023 1840   LABSPEC 1.010 01/23/2023 1840   PHURINE 5.0 01/23/2023 1840   GLUCOSEU NEGATIVE 01/23/2023 1840   HGBUR LARGE (A) 01/23/2023 1840   BILIRUBINUR NEGATIVE 01/23/2023 1840   BILIRUBINUR neg 09/05/2014 1545   KETONESUR NEGATIVE 01/23/2023 1840   PROTEINUR NEGATIVE 01/23/2023 1840   UROBILINOGEN 0.2 09/05/2014 1545   NITRITE NEGATIVE 01/23/2023 1840   LEUKOCYTESUR LARGE (A) 01/23/2023 1840    Results for orders placed or performed during the hospital encounter of 01/23/23  Resp  panel by RT-PCR (RSV, Flu A&B, Covid) Anterior Nasal Swab     Status: None   Collection Time: 01/23/23  3:18 PM   Specimen: Anterior Nasal Swab  Result Value Ref Range Status   SARS Coronavirus 2 by RT PCR NEGATIVE NEGATIVE Final   Influenza A by PCR NEGATIVE NEGATIVE Final   Influenza B by PCR NEGATIVE NEGATIVE Final         Resp Syncytial Virus by PCR NEGATIVE NEGATIVE Final           _______________________________________________ Hospitalist was called for admission for  AKI, Sepsis,  Lower urinary tract infectious disease    The following Work up has been ordered so far:  Orders Placed This Encounter  Procedures   Blood Culture (routine x 2)   Resp panel by RT-PCR (RSV, Flu A&B, Covid) Anterior Nasal Swab   Urine Culture   DG Hip Unilat With Pelvis 2-3 Views Left   CT ABDOMEN PELVIS WO CONTRAST   MR LUMBAR SPINE WO CONTRAST   MR THORACIC SPINE WO CONTRAST   MR Cervical Spine Wo Contrast   DG Chest 1 View   Lactic acid, plasma   Comprehensive metabolic panel   CBC with Differential   Protime-INR   APTT   Urinalysis, w/ Reflex to Culture (Infection Suspected) -Urine, Clean Catch   Diet NPO time specified   Cardiac monitoring   Document height and weight   Assess and Document Glasgow Coma Scale   Document vital signs within 1-hour of fluid bolus completion.  Notify provider of abnormal vital signs despite fluid resuscitation.   Refer to [employer]  Report: Sepsis Bundle ED/IP   Notify provider for difficulties obtaining IV access   Initiate [place] Fluid Protocol   Insert foley catheter   vancomycin per pharmacy consult   Consult to nephrology   Consult to hospitalist   Airborne and Contact precautions   Pulse oximetry, continuous   I-Stat Chem 8, ED   ED EKG 12-Lead   Insert peripheral IV X 1   VAS [place] LOWER EXTREMITY VENOUS (DVT) (7a-7p)      OTHER Significant initial  Findings:  labs showing:  Recent Labs  Lab 01/23/23 1547 01/23/23 2001 01/23/23 2358   NA 129* 134* 136  K 5.1 4.9 5.6*  CO2 18*  --   --   GLUCOSE 117* 104*  --   BUN 96* 92*  --   CREATININE 14.05* 12.40*  --   CALCIUM 8.2*  --   --     Cr  Up from baseline see below Lab Results  Component Value Date   CREATININE 12.40 (H) 01/23/2023   CREATININE 14.05 (H) 01/23/2023   CREATININE 1.13 12/27/2022    Recent Labs  Lab 01/23/23 1547  AST 25  ALT 12  ALKPHOS 45  BILITOT 0.8  PROT 6.2*  ALBUMIN 2.9*   Lab Results  Component Value Date   CALCIUM 8.2 (L) 01/23/2023    Plt: Lab Results  Component Value Date   PLT 116 (L) 01/23/2023     Venous  Blood Gas result:  pH   7.403 Sodium 136 mmol/L   pCO2, Ven 33.6 Low  mmHg Potassium 5.6 High  mmol/L  pO2, Ven 145 High  mmHg           Recent Labs  Lab 01/23/23 1547 01/23/23 2001  WBC 14.2*  --   NEUTROABS 12.2*  --   HGB 8.4* 9.2*  HCT 24.1* 27.0*  MCV 94.9  --   PLT 116*  --     HG/HCT    Down  from baseline see below    Component Value Date/Time   HGB 9.2 (L) 01/23/2023 2001   HCT 27.0 (L) 01/23/2023 2001   MCV 94.9 01/23/2023 1547   MCV 94.0 09/05/2014 1545      Cultures: No results found for: "SDES", "SPECREQUEST", "CULT", "REPTSTATUS"   Radiological Exams on Admission: MR LUMBAR SPINE WO CONTRAST  Result Date: 01/23/2023 CLINICAL DATA:  Back pain EXAM: MRI CERVICAL, THORACIC AND LUMBAR SPINE WITHOUT CONTRAST TECHNIQUE: Multiplanar and multiecho pulse sequences of the cervical spine, to include the craniocervical junction and cervicothoracic junction, and thoracic and lumbar spine, were obtained without intravenous contrast. COMPARISON:  09/18/2022 FINDINGS: MRI CERVICAL SPINE FINDINGS Alignment: Physiologic. Vertebrae: C3-6 ACDF.  No acute abnormality. Cord: Myelomalacia at the C3-4 level, unchanged. Posterior Fossa, vertebral arteries, paraspinal tissues: Negative. Disc levels: C1-2: Unremarkable. C2-3: Mild facet hypertrophy with small disc bulge and uncinate spurring. There is no spinal  canal stenosis. Bilateral neural foraminal stenosis. C3-4: ACDF. Unchanged small disc bulge. Unchanged severe spinal canal stenosis. No neural foraminal stenosis. C4-5: ACDF. Mild facet hypertrophy. There is no spinal canal stenosis. No neural foraminal stenosis. C5-6: ACDF with small disc bulge and uncinate spurring. Unchanged mild spinal canal stenosis. Unchanged moderate bilateral neural foraminal stenosis. C6-7: Small disc bulge. There is no spinal canal stenosis. No neural foraminal stenosis. C7-T1: Normal disc space and facet joints. There is no spinal canal stenosis. No neural foraminal stenosis. MRI THORACIC SPINE FINDINGS Alignment:  Physiologic. Vertebrae: No fracture, evidence of discitis, or bone lesion. Modic  type 2 endplate signal changes at L4-5, unchanged. Cord:  Normal signal and morphology. Paraspinal and other soft tissues: Negative. Disc levels: There is mild multilevel degenerative disc disease, greatest at T9-10 where there is mild spinal canal stenosis. No stenosis at any other level. MRI LUMBAR SPINE FINDINGS Segmentation:  Standard. Alignment:  Physiologic. Vertebrae:  No fracture, evidence of discitis, or bone lesion. Conus medullaris and cauda equina: Conus extends to the T12 level. Conus and cauda equina appear normal. Paraspinal and other soft tissues: Negative Disc levels: L1-L2: Normal disc space and facet joints. No spinal canal stenosis. No neural foraminal stenosis. L2-L3: Small disc bulge, unchanged. Unchanged mild spinal canal stenosis. No neural foraminal stenosis. L3-L4: Unchanged small disc bulge. Mild spinal canal stenosis. No neural foraminal stenosis. L4-L5: Unchanged intermediate sized disc bulge, right asymmetric. No spinal canal stenosis. Unchanged mild right neural foraminal stenosis. L5-S1: Normal disc space and facet joints. No spinal canal stenosis. No neural foraminal stenosis. Visualized sacrum: Normal. IMPRESSION: 1. C3-6 ACDF with unchanged severe spinal canal  stenosis at C3-4 and focal myelomalacia. 2. Unchanged moderate bilateral C5-6 neural foraminal stenosis. 3. Mild multilevel thoracic degenerative disc disease with mild spinal canal stenosis at T9-10. 4. Unchanged mild spinal canal stenosis at L2-3, L3-4 and L4-5. 5. Unchanged mild right L4-5 neural foraminal stenosis. Electronically Signed   By: Ulyses Jarred M.D.   On: 01/23/2023 22:33   MR THORACIC SPINE WO CONTRAST  Result Date: 01/23/2023 CLINICAL DATA:  Back pain EXAM: MRI CERVICAL, THORACIC AND LUMBAR SPINE WITHOUT CONTRAST TECHNIQUE: Multiplanar and multiecho pulse sequences of the cervical spine, to include the craniocervical junction and cervicothoracic junction, and thoracic and lumbar spine, were obtained without intravenous contrast. COMPARISON:  09/18/2022 FINDINGS: MRI CERVICAL SPINE FINDINGS Alignment: Physiologic. Vertebrae: C3-6 ACDF.  No acute abnormality. Cord: Myelomalacia at the C3-4 level, unchanged. Posterior Fossa, vertebral arteries, paraspinal tissues: Negative. Disc levels: C1-2: Unremarkable. C2-3: Mild facet hypertrophy with small disc bulge and uncinate spurring. There is no spinal canal stenosis. Bilateral neural foraminal stenosis. C3-4: ACDF. Unchanged small disc bulge. Unchanged severe spinal canal stenosis. No neural foraminal stenosis. C4-5: ACDF. Mild facet hypertrophy. There is no spinal canal stenosis. No neural foraminal stenosis. C5-6: ACDF with small disc bulge and uncinate spurring. Unchanged mild spinal canal stenosis. Unchanged moderate bilateral neural foraminal stenosis. C6-7: Small disc bulge. There is no spinal canal stenosis. No neural foraminal stenosis. C7-T1: Normal disc space and facet joints. There is no spinal canal stenosis. No neural foraminal stenosis. MRI THORACIC SPINE FINDINGS Alignment:  Physiologic. Vertebrae: No fracture, evidence of discitis, or bone lesion. Modic type 2 endplate signal changes at L4-5, unchanged. Cord:  Normal signal and  morphology. Paraspinal and other soft tissues: Negative. Disc levels: There is mild multilevel degenerative disc disease, greatest at T9-10 where there is mild spinal canal stenosis. No stenosis at any other level. MRI LUMBAR SPINE FINDINGS Segmentation:  Standard. Alignment:  Physiologic. Vertebrae:  No fracture, evidence of discitis, or bone lesion. Conus medullaris and cauda equina: Conus extends to the T12 level. Conus and cauda equina appear normal. Paraspinal and other soft tissues: Negative Disc levels: L1-L2: Normal disc space and facet joints. No spinal canal stenosis. No neural foraminal stenosis. L2-L3: Small disc bulge, unchanged. Unchanged mild spinal canal stenosis. No neural foraminal stenosis. L3-L4: Unchanged small disc bulge. Mild spinal canal stenosis. No neural foraminal stenosis. L4-L5: Unchanged intermediate sized disc bulge, right asymmetric. No spinal canal stenosis. Unchanged mild right neural foraminal stenosis. L5-S1: Normal disc  space and facet joints. No spinal canal stenosis. No neural foraminal stenosis. Visualized sacrum: Normal. IMPRESSION: 1. C3-6 ACDF with unchanged severe spinal canal stenosis at C3-4 and focal myelomalacia. 2. Unchanged moderate bilateral C5-6 neural foraminal stenosis. 3. Mild multilevel thoracic degenerative disc disease with mild spinal canal stenosis at T9-10. 4. Unchanged mild spinal canal stenosis at L2-3, L3-4 and L4-5. 5. Unchanged mild right L4-5 neural foraminal stenosis. Electronically Signed   By: Ulyses Jarred M.D.   On: 01/23/2023 22:33   MR Cervical Spine Wo Contrast  Result Date: 01/23/2023 CLINICAL DATA:  Back pain EXAM: MRI CERVICAL, THORACIC AND LUMBAR SPINE WITHOUT CONTRAST TECHNIQUE: Multiplanar and multiecho pulse sequences of the cervical spine, to include the craniocervical junction and cervicothoracic junction, and thoracic and lumbar spine, were obtained without intravenous contrast. COMPARISON:  09/18/2022 FINDINGS: MRI CERVICAL  SPINE FINDINGS Alignment: Physiologic. Vertebrae: C3-6 ACDF.  No acute abnormality. Cord: Myelomalacia at the C3-4 level, unchanged. Posterior Fossa, vertebral arteries, paraspinal tissues: Negative. Disc levels: C1-2: Unremarkable. C2-3: Mild facet hypertrophy with small disc bulge and uncinate spurring. There is no spinal canal stenosis. Bilateral neural foraminal stenosis. C3-4: ACDF. Unchanged small disc bulge. Unchanged severe spinal canal stenosis. No neural foraminal stenosis. C4-5: ACDF. Mild facet hypertrophy. There is no spinal canal stenosis. No neural foraminal stenosis. C5-6: ACDF with small disc bulge and uncinate spurring. Unchanged mild spinal canal stenosis. Unchanged moderate bilateral neural foraminal stenosis. C6-7: Small disc bulge. There is no spinal canal stenosis. No neural foraminal stenosis. C7-T1: Normal disc space and facet joints. There is no spinal canal stenosis. No neural foraminal stenosis. MRI THORACIC SPINE FINDINGS Alignment:  Physiologic. Vertebrae: No fracture, evidence of discitis, or bone lesion. Modic type 2 endplate signal changes at L4-5, unchanged. Cord:  Normal signal and morphology. Paraspinal and other soft tissues: Negative. Disc levels: There is mild multilevel degenerative disc disease, greatest at T9-10 where there is mild spinal canal stenosis. No stenosis at any other level. MRI LUMBAR SPINE FINDINGS Segmentation:  Standard. Alignment:  Physiologic. Vertebrae:  No fracture, evidence of discitis, or bone lesion. Conus medullaris and cauda equina: Conus extends to the T12 level. Conus and cauda equina appear normal. Paraspinal and other soft tissues: Negative Disc levels: L1-L2: Normal disc space and facet joints. No spinal canal stenosis. No neural foraminal stenosis. L2-L3: Small disc bulge, unchanged. Unchanged mild spinal canal stenosis. No neural foraminal stenosis. L3-L4: Unchanged small disc bulge. Mild spinal canal stenosis. No neural foraminal stenosis.  L4-L5: Unchanged intermediate sized disc bulge, right asymmetric. No spinal canal stenosis. Unchanged mild right neural foraminal stenosis. L5-S1: Normal disc space and facet joints. No spinal canal stenosis. No neural foraminal stenosis. Visualized sacrum: Normal. IMPRESSION: 1. C3-6 ACDF with unchanged severe spinal canal stenosis at C3-4 and focal myelomalacia. 2. Unchanged moderate bilateral C5-6 neural foraminal stenosis. 3. Mild multilevel thoracic degenerative disc disease with mild spinal canal stenosis at T9-10. 4. Unchanged mild spinal canal stenosis at L2-3, L3-4 and L4-5. 5. Unchanged mild right L4-5 neural foraminal stenosis. Electronically Signed   By: Ulyses Jarred M.D.   On: 01/23/2023 22:33   CT ABDOMEN PELVIS WO CONTRAST  Result Date: 01/23/2023 CLINICAL DATA:  Kidney failure, acute renal failure EXAM: CT ABDOMEN AND PELVIS WITHOUT CONTRAST TECHNIQUE: Multidetector CT imaging of the abdomen and pelvis was performed following the standard protocol without IV contrast. RADIATION DOSE REDUCTION: This exam was performed according to the departmental dose-optimization program which includes automated exposure control, adjustment of the mA and/or kV  according to patient size and/or use of iterative reconstruction technique. COMPARISON:  CT 03/26/2018. FINDINGS: Lower chest: No acute abnormality. Hepatobiliary: No focal liver abnormality is seen. Decompressed gallbladder with cholelithiasis. Pancreas: Unremarkable. No pancreatic ductal dilatation or surrounding inflammatory changes. Spleen: Normal in size without focal abnormality. Adrenals/Urinary Tract: Adrenal glands are unremarkable. Moderate bilateral hydronephrosis. No nephroureterolithiasis. Mild perinephric stranding bilaterally. Markedly dilated urinary bladder. Stomach/Bowel: The stomach is within normal limits. There is no evidence of bowel obstruction.The appendix is normal. Vascular/Lymphatic: Aortoiliac atherosclerosis. No AAA. No  lymphadenopathy. Retroaortic left renal vein. Reproductive: Enlarged prostate gland. Bilateral scrotal varicoceles. Other: Small fat containing umbilical hernia. No abdominopelvic ascites. Musculoskeletal: No acute osseous abnormality. No suspicious osseous lesion. Prior left hip arthroplasty. Normal alignment. Moderate right hip osteoarthritis. Degenerative changes of the spine most prominent at L4-L5. IMPRESSION: Findings suggestive of bladder outlet obstruction due to an enlarged prostate gland. Markedly distended urinary bladder and moderate bilateral hydroureteronephrosis. No other acute findings in the abdomen or pelvis. Additional chronic findings as described above. Electronically Signed   By: Maurine Simmering M.D.   On: 01/23/2023 18:46   DG Hip Unilat With Pelvis 2-3 Views Left  Result Date: 01/23/2023 CLINICAL DATA:  Sepsis, surgery EXAM: DG HIP (WITH OR WITHOUT PELVIS) 3V LEFT COMPARISON:  08/14/2021. FINDINGS: Status post left total hip arthroplasty. No periprosthetic lucency to suggest loosening. No acute fracture, dislocation or subluxation. Pelvic ring intact. IMPRESSION: Unremarkable appearance status post left total hip arthroplasty. Electronically Signed   By: Sammie Bench M.D.   On: 01/23/2023 17:29   DG Chest 1 View  Result Date: 01/23/2023 CLINICAL DATA:  Recent surgery, sepsis EXAM: CHEST  1 VIEW COMPARISON:  None Available. FINDINGS: The heart is enlarged. The lungs are clear. There is no pleural effusion or pneumothorax. No acute fractures are seen. IMPRESSION: Cardiomegaly. No acute pulmonary process. Electronically Signed   By: Ronney Asters M.D.   On: 01/23/2023 17:29   VAS [place] LOWER EXTREMITY VENOUS (DVT) (7a-7p)  Result Date: 01/23/2023  Lower Venous DVT Study Patient Name:  KANON NOVOSEL  Date of Exam:   01/23/2023 Medical Rec #: [id number]             Accession #:    [id number] Date of Birth: 11/24/1956             Patient Gender: M Patient Age:   60 years Exam Location:   [facility] Procedure:      VAS [place] LOWER EXTREMITY VENOUS (DVT) Referring Phys: Garnette Gunner --------------------------------------------------------------------------------  Indications: Edema.  Risk Factors: Surgery. Limitations: Poor ultrasound/tissue interface. Comparison Study: No prior studies. Performing Technologist: Oliver Hum RVT  Examination Guidelines: A complete evaluation includes B-mode imaging, spectral Doppler, color Doppler, and power Doppler as needed of all accessible portions of each vessel. Bilateral testing is considered an integral part of a complete examination. Limited examinations for reoccurring indications may be performed as noted. The reflux portion of the exam is performed with the patient in reverse Trendelenburg.  +-----+---------------+---------+-----------+----------+--------------+ RIGHTCompressibilityPhasicitySpontaneityPropertiesThrombus Aging +-----+---------------+---------+-----------+----------+--------------+ CFV  Full           Yes      Yes                                 +-----+---------------+---------+-----------+----------+--------------+   +---------+---------------+---------+-----------+----------+--------------+ LEFT     CompressibilityPhasicitySpontaneityPropertiesThrombus Aging +---------+---------------+---------+-----------+----------+--------------+ CFV      Full  Yes      Yes                                 +---------+---------------+---------+-----------+----------+--------------+ SFJ      Full                                                        +---------+---------------+---------+-----------+----------+--------------+ FV Prox  Full                                                        +---------+---------------+---------+-----------+----------+--------------+ FV Mid   Full           Yes      Yes                                  +---------+---------------+---------+-----------+----------+--------------+ FV DistalFull                                                        +---------+---------------+---------+-----------+----------+--------------+ PFV      Full                                                        +---------+---------------+---------+-----------+----------+--------------+ POP      Full           Yes      Yes                                 +---------+---------------+---------+-----------+----------+--------------+ PTV      Full                                                        +---------+---------------+---------+-----------+----------+--------------+ PERO     Full                                                        +---------+---------------+---------+-----------+----------+--------------+    Summary: RIGHT: - No evidence of common femoral vein obstruction.  LEFT: - There is no evidence of deep vein thrombosis in the lower extremity.  - No cystic structure found in the popliteal fossa.  *See table(s) above for measurements and observations.    Preliminary    _______________________________________________________________________________________________________ Latest  Blood pressure (!) 83/47, pulse 95, temperature (!) 101.1 F (38.4 C), temperature source Oral, resp. rate (!) 25, SpO2 100 %.  Vitals  labs and radiology finding personally reviewed  Review of Systems:    Pertinent positives include:  chills, fatigue,   Constitutional:  No weight loss, night sweats, Fevers, weight loss  HEENT:  No headaches, Difficulty swallowing,Tooth/dental problems,Sore throat,  No sneezing, itching, ear ache, nasal congestion, post nasal drip,  Cardio-vascular:  No chest pain, Orthopnea, PND, anasarca, dizziness, palpitations.no Bilateral lower extremity swelling  GI:  No heartburn, indigestion, abdominal pain, nausea, vomiting, diarrhea, change in bowel habits, loss of  appetite, melena, blood in stool, hematemesis Resp:  no shortness of breath at rest. No dyspnea on exertion, No excess mucus, no productive cough, No non-productive cough, No coughing up of blood.No change in color of mucus.No wheezing. Skin:  no rash or lesions. No jaundice GU:  no dysuria, change in color of urine, no urgency or frequency. No straining to urinate.  No flank pain.  Musculoskeletal:  No joint pain or no joint swelling. No decreased range of motion. No back pain.  Psych:  No change in mood or affect. No depression or anxiety. No memory loss.  Neuro: no localizing neurological complaints, no tingling, no weakness, no double vision, no gait abnormality, no slurred speech, no confusion  All systems reviewed and apart from [place] all are negative _______________________________________________________________________________________________ Past Medical History:   Past Medical History:  Diagnosis Date   Arthritis    BPH (benign prostatic hyperplasia)    Colon polyp    ED (erectile dysfunction)    Elevated liver enzymes    Gallstones    Hepatitis    History of anemia    Hypercholesterolemia    Hypothyroidism    Mild tricuspid regurgitation    Mitral valve regurgitation    Thyroid disease    Vertigo       Past Surgical History:  Procedure Laterality Date   ANTERIOR CERVICAL DECOMP/DISCECTOMY FUSION N/A 07/23/2022   Procedure: Anterior Cervical Discectomy and Fusion Cervical Three-Four/Four-Five/Five-Six;  Surgeon: Vallarie Mare, MD;  Location: [facility];  Service: Neurosurgery;  Laterality: N/A;  3C   FLEXIBLE SIGMOIDOSCOPY     TOTAL HIP ARTHROPLASTY Left 01/06/2023   Procedure: LEFT TOTAL HIP ARTHROPLASTY ANTERIOR APPROACH;  Surgeon: Frederik Pear, MD;  Location: [facility];  Service: Orthopedics;  Laterality: Left;    Social History:  Ambulatory walker        reports that he has quit smoking. He has never used smokeless tobacco. He reports current alcohol use. He  reports that he does not use drugs.   Family History:   Family History  Problem Relation Age of Onset   Cancer Mother        pancreatic   Cancer Father        stomach   Kidney failure Brother    Other Brother        neck tumor   Hypertension Brother    Diabetes Brother    Lupus Daughter    Hypertension Sister    Other Son        boating accident   ______________________________________________________________________________________________ Allergies: Allergies  Allergen Reactions   Tadalafil Other (See Comments)    Headache      Prior to Admission medications   Medication Sig Start Date End Date Taking? Authorizing Provider  aspirin EC 81 MG tablet Take 1 tablet (81 mg total) by mouth 2 (two) times daily. 01/06/23  Yes Leighton Parody, PA-C  azaTHIOprine (IMURAN) 50 MG tablet Take 75 mg by mouth daily. 05/28/21  Yes [provider]  levothyroxine (SYNTHROID) 200 MCG tablet Take 200 mcg by mouth daily before breakfast.   Yes [provider]  Multiple Vitamins-Minerals (CENTRUM ADULT PO) Take 1 tablet by mouth daily.   Yes [provider]  oxyCODONE-acetaminophen (PERCOCET/ROXICET) 5-325 MG tablet Take 1 tablet by mouth every 4 (four) hours as needed for severe pain. 01/06/23  Yes Joanell Rising K, PA-C  tiZANidine (ZANAFLEX) 2 MG tablet Take 1 tablet (2 mg total) by mouth every 6 (six) hours as needed for muscle spasms. 01/06/23  Yes Leighton Parody, PA-C    ___________________________________________________________________________________________________ Physical Exam:    01/23/2023   10:45 PM 01/23/2023   10:30 PM 01/23/2023    7:30 PM  Vitals with BMI  Systolic 83 87 852  Diastolic 47 62 778  Pulse 95 100 99    1. General:  in No  Acute distress   Chronically ill   -appearing 2. Psychological: Alert and   Oriented 3. Head/ENT:   Dry Mucous Membranes                          Head Non traumatic, neck supple                           Poor  Dentition 4. SKIN decreased Skin turgor,  Skin clean Dry and intact no rash 5. Heart: Regular rate and rhythm no  Murmur, no Rub or gallop 6. Lungs:  no wheezes or crackles   7. Abdomen: Soft,  non-tender, Non distended   obese  bowel sounds present 8. Lower extremities: no clubbing, cyanosis,  L >R edema 9. Neurologically Grossly intact, moving all 4 extremities equally   10. MSK: Normal range of motion    Chart has been reviewed  ______________________________________________________________________________________________  Assessment/Plan 67 y.o. male with medical history significant of  autoimmune hepatitis, BPH, Anemia, hypothyroidism   Admitted forAKI, Sepsis,  Lower urinary tract infectious disease      Present on Admission:  AKI (acute kidney injury) ([place])  Cervical myelopathy ([place])  UTI (urinary tract infection)  Hepatitis, autoimmune ([place])  Debility  Sepsis ([place])     AKI (acute kidney injury) ([place]) -  evidence of acute renal failure due to presence of following: Cr increased >0.3 from baseline   likely secondary to   urinary retention        check FeNA       Rehydrate with IV fluids     renal consult.    Cervical myelopathy ([facility]) Chronic   UTI (urinary tract infection)  - treat with Rocephin         await results of urine culture and adjust antibiotic coverage as needed   Hepatitis, autoimmune ([place]) On chronic imuuran  Debility Will need pt ot eval  DM2 (diabetes mellitus, type 2) ([place])  - Order Sensitive    - Hold by mouth medications    Sepsis ([place])  -SIRS criteria met with  elevated white blood cell count,       Component Value Date/Time   WBC 14.2 (H) 01/23/2023 1547   LYMPHSABS 0.5 (L) 01/23/2023 1547    Today's Vitals   01/23/23 2230 01/23/23 2245 01/23/23 2315 01/23/23 2333  BP: (!) 87/62 (!) 83/47 (!) 82/58   Pulse: 100 95 93   Resp: (!) 24 (!) 25 (!) 23   Temp:      TempSrc:      SpO2: 99% 100% 98%  Weight:    108 kg   Height:    '6\' 3"'$  (1.905 m)  PainSc:         The recent clinical data is shown below. Vitals:   01/23/23 2230 01/23/23 2245 01/23/23 2315 01/23/23 2333  BP: (!) 87/62 (!) 83/47 (!) 82/58   Pulse: 100 95 93   Resp: (!) 24 (!) 25 (!) 23   Temp:      TempSrc:      SpO2: 99% 100% 98%   Weight:    108 kg  Height:    '6\' 3"'$  (1.905 m)    -Most likely source being:  urinary,    Patient meeting criteria for Severe sepsis with    evidence of end organ damage/organ dysfunction such as   Acute Kidney Injury with Cr > 2,  Lab Results  Component Value Date   CREATININE 12.40 (H) 01/23/2023   CREATININE 14.05 (H) 01/23/2023    elevated lactic acid >2     Component Value Date/Time   LATICACIDVEN 2.1 (HH) 01/23/2023 6644    acute metabolic encephalopathy    - Obtain serial lactic acid and procalcitonin level.  - Initiated IV antibiotics in ER: Antibiotics Given (last 72 hours)     Date/Time Action Medication Dose Rate   01/23/23 1753 New Bag/Given   cefTRIAXone (ROCEPHIN) 2 g in sodium chloride 0.9 % 100 mL IVPB 2 g 200 mL/hr   01/23/23 1837 New Bag/Given  [Awaiting from pharmacy]   vancomycin (VANCOREADY) IVPB 2000 mg/400 mL 2,000 mg 200 mL/hr       Will continue  on : rocephin   - await results of blood and urine culture  - Rehydrate aggressively  Intravenous fluids were administered    1:25 AM    Other plan as per orders.  DVT prophylaxis:  SCD       Code Status:    Code Status: Prior FULL CODE as per patient   I had personally discussed CODE STATUS with patient      Family Communication:   Family not at  Bedside    Disposition Plan:      To home once workup is complete and patient is stable   Following barriers for discharge:                            Electrolytes corrected                                                       Will need consultants to evaluate patient prior to discharge                       Would benefit from PT/OT eval prior to DC   Ordered                                      Consults called:  nephrology   Admission status:  ED Disposition     ED Disposition  [place]: [facility] [[id number]]  Level of Care: Progressive [102]  Admit to Progressive based on following criteria: MULTISYSTEM THREATS such as stable sepsis,  metabolic/electrolyte imbalance with or without encephalopathy that is responding to early treatment.  May admit patient to [facility] or [facility] if equivalent level of care is available:: No  Covid Evaluation: Asymptomatic - no recent exposure (last 10 days) testing not required  Diagnosis: AKI (acute kidney injury) [facility]) [[id number]]  Admitting Physician: Toy Baker [[id number]]  Attending Physician: Toy Baker [[id number]]  Certification:: I certify this patient will need inpatient services for at least 2 midnights  Estimated Length of Stay: 2           inpatient     I Expect 2 midnight stay secondary to severity of patient's current illness need for inpatient interventions justified by the following:  hemodynamic instability despite optimal treatment ( hypotension  )   Severe lab/radiological/exam abnormalities including:    UtI sepsis and extensive comorbidities including:  liver disease     That are currently affecting medical management.   I expect  patient to be hospitalized for 2 midnights requiring inpatient medical care.  Patient is at high risk for adverse outcome (such as loss of life or disability) if not treated.  Indication for inpatient stay as follows:  Severe change from baseline regarding mental status    Need for IV antibiotics, IV fluids     Level of care          progressive tele indefinitely please discontinue once patient no longer qualifies COVID-19 Labs    Lab Results  Component Value Date   Hydro 01/23/2023     Precautions: admitted as   Covid Negative     Neils Siracusa 01/23/2023, 1:28 AM     [facility]     after 2 AM please page floor coverage PA If 7AM-7PM, please contact the day team taking care of the patient using [email or web address]   Patient was evaluated in the context of the global COVID-19 pandemic, which necessitated consideration that the patient might be at risk for infection with the SARS-CoV-2 virus that causes COVID-19. Institutional protocols and algorithms that pertain to the evaluation of patients at risk for COVID-19 are in a state of rapid change based on information released by regulatory bodies including the CDC and federal and state organizations. These policies and algorithms were followed during the patient's care.

## 2023-01-23 NOTE — Progress Notes (Signed)
Pharmacy Antibiotic Note  Alex West is a 67 y.o. male admitted on 01/23/2023 with sepsis.  Pharmacy has been consulted for vancomycin dosing.WBC elevated at 14.2. Tmax 101.7. Scr significantly elevated at 14.05, baseline ~1.  Plan: Vancomycin 2000 mg IV x 1 Dose per random levels Monitor s/sx improvement, C&S, renal function      Temp (24hrs), Avg:101.5 F (38.6 C), Min:101.2 F (38.4 C), Max:101.7 F (38.7 C)  Recent Labs  Lab 01/23/23 1543 01/23/23 1547  WBC  --  14.2*  CREATININE  --  14.05*  LATICACIDVEN 1.5  --     CrCl cannot be calculated (Unknown ideal weight.).    Allergies  Allergen Reactions   Tadalafil Other (See Comments)    Headache     Antimicrobials this admission: 2/1 ceftriaxone x 1 2/1 vancomycin >>   Dose adjustments this admission:  Microbiology results: 2/1 BCx: sent 2/1 RVP: neg   Thank you for allowing pharmacy to be a part of this patient's care.  Eliseo Gum, PharmD PGY1 Pharmacy Resident   01/23/2023  6:38 PM

## 2023-01-23 NOTE — ED Notes (Signed)
Patient bladder scanned twice. Both times reading >999. Provider notified.

## 2023-01-23 NOTE — Progress Notes (Signed)
Left lower extremity venous duplex has been completed. Preliminary results can be found in CV Proc through chart review.  Results were given to Dr. Truett Mainland.  01/23/23 4:51 PM Alex West RVT

## 2023-01-23 NOTE — ED Notes (Signed)
Patient had BM. Patient cleaned and changed.

## 2023-01-23 NOTE — ED Notes (Signed)
Patient transported to X-ray 

## 2023-01-24 DIAGNOSIS — R7881 Bacteremia: Secondary | ICD-10-CM | POA: Insufficient documentation

## 2023-01-24 DIAGNOSIS — N390 Urinary tract infection, site not specified: Secondary | ICD-10-CM | POA: Diagnosis present

## 2023-01-24 DIAGNOSIS — A419 Sepsis, unspecified organism: Secondary | ICD-10-CM | POA: Diagnosis present

## 2023-01-24 DIAGNOSIS — N179 Acute kidney failure, unspecified: Secondary | ICD-10-CM | POA: Diagnosis not present

## 2023-01-24 LAB — I-STAT VENOUS BLOOD GAS, ED
Acid-base deficit: 3 mmol/L — ABNORMAL HIGH (ref 0.0–2.0)
Bicarbonate: 21 mmol/L (ref 20.0–28.0)
Calcium, Ion: 1.13 mmol/L — ABNORMAL LOW (ref 1.15–1.40)
HCT: 22 % — ABNORMAL LOW (ref 39.0–52.0)
Hemoglobin: 7.5 g/dL — ABNORMAL LOW (ref 13.0–17.0)
O2 Saturation: 99 %
Potassium: 5.6 mmol/L — ABNORMAL HIGH (ref 3.5–5.1)
Sodium: 136 mmol/L (ref 135–145)
TCO2: 22 mmol/L (ref 22–32)
pCO2, Ven: 33.6 mmHg — ABNORMAL LOW (ref 44–60)
pH, Ven: 7.403 (ref 7.25–7.43)
pO2, Ven: 145 mmHg — ABNORMAL HIGH (ref 32–45)

## 2023-01-24 LAB — BASIC METABOLIC PANEL
Anion gap: 8 (ref 5–15)
BUN: 42 mg/dL — ABNORMAL HIGH (ref 8–23)
CO2: 22 mmol/L (ref 22–32)
Calcium: 8.3 mg/dL — ABNORMAL LOW (ref 8.9–10.3)
Chloride: 109 mmol/L (ref 98–111)
Creatinine, Ser: 3.04 mg/dL — ABNORMAL HIGH (ref 0.61–1.24)
GFR, Estimated: 22 mL/min — ABNORMAL LOW (ref >60)
Glucose, Bld: 127 mg/dL — ABNORMAL HIGH (ref 70–99)
Potassium: 3.8 mmol/L (ref 3.5–5.1)
Sodium: 139 mmol/L (ref 135–145)

## 2023-01-24 LAB — BLOOD CULTURE ID PANEL (REFLEXED) - BCID2

## 2023-01-24 LAB — CBC WITH DIFFERENTIAL/PLATELET
Abs Immature Granulocytes: 0.35 10*3/uL — ABNORMAL HIGH (ref 0.00–0.07)
Basophils Absolute: 0.1 10*3/uL (ref 0.0–0.1)
Basophils Relative: 1 %
Eosinophils Absolute: 0 10*3/uL (ref 0.0–0.5)
Eosinophils Relative: 0 %
HCT: 22 % — ABNORMAL LOW (ref 39.0–52.0)
Hemoglobin: 7.6 g/dL — ABNORMAL LOW (ref 13.0–17.0)
Immature Granulocytes: 2 %
Lymphocytes Relative: 4 %
Lymphs Abs: 0.6 10*3/uL — ABNORMAL LOW (ref 0.7–4.0)
MCH: 33.6 pg (ref 26.0–34.0)
MCHC: 34.5 g/dL (ref 30.0–36.0)
MCV: 97.3 fL (ref 80.0–100.0)
Monocytes Absolute: 1.2 10*3/uL — ABNORMAL HIGH (ref 0.1–1.0)
Monocytes Relative: 7 %
Neutro Abs: 15.3 10*3/uL — ABNORMAL HIGH (ref 1.7–7.7)
Neutrophils Relative %: 86 %
Platelets: 84 10*3/uL — ABNORMAL LOW (ref 150–400)
RBC: 2.26 MIL/uL — ABNORMAL LOW (ref 4.22–5.81)
RDW: 13.6 % (ref 11.5–15.5)
WBC: 17.6 10*3/uL — ABNORMAL HIGH (ref 4.0–10.5)
nRBC: 0 % (ref 0.0–0.2)

## 2023-01-24 LAB — RETICULOCYTES
Immature Retic Fract: 9.9 % (ref 2.3–15.9)
RBC.: 2.31 MIL/uL — ABNORMAL LOW (ref 4.22–5.81)
Retic Count, Absolute: 57.3 10*3/uL (ref 19.0–186.0)
Retic Ct Pct: 2.5 % (ref 0.4–3.1)

## 2023-01-24 LAB — IRON AND TIBC
Iron: 25 ug/dL — ABNORMAL LOW (ref 45–182)
Saturation Ratios: 14 % — ABNORMAL LOW (ref 17.9–39.5)
TIBC: 182 ug/dL — ABNORMAL LOW (ref 250–450)
UIBC: 157 ug/dL

## 2023-01-24 LAB — PROCALCITONIN: Procalcitonin: 33.97 ng/mL

## 2023-01-24 LAB — CK
Total CK: 459 U/L — ABNORMAL HIGH (ref 49–397)
Total CK: 575 U/L — ABNORMAL HIGH (ref 49–397)

## 2023-01-24 LAB — FOLATE: Folate: 22.4 ng/mL (ref >5.9)

## 2023-01-24 LAB — VITAMIN B12: Vitamin B-12: 416 pg/mL (ref 180–914)

## 2023-01-24 LAB — LACTIC ACID, PLASMA: Lactic Acid, Venous: 1.6 mmol/L (ref 0.5–1.9)

## 2023-01-24 LAB — FERRITIN: Ferritin: 784 ng/mL — ABNORMAL HIGH (ref 24–336)

## 2023-01-24 LAB — OSMOLALITY: Osmolality: 313 mOsm/kg — ABNORMAL HIGH (ref 275–295)

## 2023-01-24 LAB — AMMONIA: Ammonia: 28 umol/L (ref 9–35)

## 2023-01-24 LAB — TSH: TSH: 4.894 u[IU]/mL — ABNORMAL HIGH (ref 0.350–4.500)

## 2023-01-24 MED ORDER — CHLORHEXIDINE GLUCONATE CLOTH 2 % EX PADS
6.0000 | MEDICATED_PAD | Freq: Every day | CUTANEOUS | Status: DC
  Administered 2023-01-25 – 2023-01-26 (×2): 6 via TOPICAL

## 2023-01-24 MED ORDER — SODIUM CHLORIDE 0.9 % IV SOLN: 2.0000 g | INTRAVENOUS | Status: DC

## 2023-01-24 MED ORDER — SODIUM CHLORIDE 0.9 % IV SOLN: INTRAVENOUS | Status: AC

## 2023-01-24 MED ORDER — LEVOTHYROXINE SODIUM 100 MCG PO TABS
200.0000 ug | ORAL_TABLET | Freq: Every day | ORAL | Status: DC
  Administered 2023-01-25 – 2023-01-26 (×2): 200 ug via ORAL
  Filled 2023-01-24 (×2): qty 2

## 2023-01-24 MED ORDER — SODIUM CHLORIDE 0.9 % IV SOLN
2.0000 g | INTRAVENOUS | Status: DC
  Administered 2023-01-24 – 2023-01-25 (×2): 2 g via INTRAVENOUS
  Filled 2023-01-24 (×2): qty 20

## 2023-01-24 MED ORDER — PHENOL 1.4 % MT LIQD
1.0000 | OROMUCOSAL | Status: DC | PRN
  Administered 2023-01-24 – 2023-01-25 (×2): 1 via OROMUCOSAL
  Filled 2023-01-24: qty 177

## 2023-01-24 MED ORDER — TAMSULOSIN HCL 0.4 MG PO CAPS
0.4000 mg | ORAL_CAPSULE | Freq: Every day | ORAL | Status: DC
  Administered 2023-01-24 – 2023-01-26 (×3): 0.4 mg via ORAL
  Filled 2023-01-24 (×3): qty 1

## 2023-01-24 MED ORDER — ACETAMINOPHEN 650 MG RE SUPP: 650.0000 mg | Freq: Four times a day (QID) | RECTAL | Status: DC | PRN

## 2023-01-24 MED ORDER — ACETAMINOPHEN 325 MG PO TABS
650.0000 mg | ORAL_TABLET | Freq: Four times a day (QID) | ORAL | Status: DC | PRN
  Administered 2023-01-25: 650 mg via ORAL
  Filled 2023-01-24: qty 2

## 2023-01-24 MED ORDER — ADULT MULTIVITAMIN W/MINERALS CH
1.0000 | ORAL_TABLET | Freq: Every day | ORAL | Status: DC
  Administered 2023-01-24 – 2023-01-26 (×3): 1 via ORAL
  Filled 2023-01-24 (×3): qty 1

## 2023-01-24 MED ORDER — ENSURE ENLIVE PO LIQD
237.0000 mL | Freq: Two times a day (BID) | ORAL | Status: DC
  Administered 2023-01-24 – 2023-01-25 (×3): 237 mL via ORAL

## 2023-01-24 MED ORDER — HYDROCODONE-ACETAMINOPHEN 5-325 MG PO TABS: 1.0000 | ORAL_TABLET | ORAL | Status: DC | PRN

## 2023-01-24 NOTE — Progress Notes (Signed)
PROGRESS NOTE    Alex West  PYP:[id number] DOB: 1956/03/13 DOA: 01/23/2023 PCP: Lawerance Cruel, MD    Brief Narrative:  Patient with history of autoimmune hepatitis, anemia, hypothyroidism, BPH who presented to emergency room with fatigue, fever 104 at home, generalized weakness for at least 1 week.  Cervical spine fusion 6 months ago, hip replacement 2 weeks ago. Patient is on Imuran.  He also reported urinary incontinence.  Does have history of BPH. In the emergency room temperature 101.7, blood pressure 82/58, WC count 17K, BUN/creatinine 96/14 with recent normal creatinine levels.  MRI cervical, thoracic and lumbar spine with severe chronic stenosis, no evidence of infection. CT scan abdomen pelvis without contrast with distended urinary bladder and hydronephrosis. Blood cultures positive for Klebsiella.  Catheter was placed.   Assessment & Plan:   Severe sepsis present on admission secondary to Klebsiella bacteremia, Klebsiella UTI. Acute on chronic urine retention secondary to BPH.  Continue maintenance IV fluids.  Foley catheter to decompress bladder and will continue until outpatient urology follow-up. Patient on Rocephin that will be continued until final cultures.  Acute severe renal failure: Secondary to BPH and urinary retention.  Foley catheter was successfully placed.  Levels are already trending down.  Will discuss with nephrology if he needs any other intervention, however expect recovery with decompression.  Monitor daily.  Cervical myelopathy: Chronic issue.  Currently without evidence of acute infection.  Start working with PT.  Chronic auto immune hepatitis: On Imuran therapy.  Hold until infection clears up.  Hypothyroidism: On Synthroid.   DVT prophylaxis: SCDs Start: 01/24/23 2458   Code Status: Full code Family Communication: None at the bedside Disposition Plan: Status is: Inpatient Remains inpatient appropriate because: Significant sepsis  and acute renal failure     Consultants:  Nephrology  Procedures:  None  Antimicrobials:  Rocephin 2/1---   Subjective: Examined patient is still emergency room.  Denies any complaints.  Feels much better after hydration.  Currently at rest denies any chest pain shortness of breath. Foley catheter draining large amount of coke colored urine.  There is some sediment on the catheter. Patient tells me that every time he had some surgery, he develops incontinence and retention.  Objective: Vitals:   01/24/23 0545 01/24/23 0717 01/24/23 0843 01/24/23 1200  BP: 124/82  (!) 140/93 132/86  Pulse: 80  78 77  Resp: '17  16 18  '$ Temp:  (!) 97.5 F (36.4 C)  98 F (36.7 C)  TempSrc:  Oral  Oral  SpO2: 95%  100% 99%  Weight:      Height:        Intake/Output Summary (Last 24 hours) at 01/24/2023 1300 Last data filed at 01/24/2023 0240 Gross per 24 hour  Intake 4020 ml  Output --  Net 4020 ml   Filed Weights   01/23/23 2333  Weight: 108 kg    Examination:  General exam: Appears calm and comfortable  Respiratory system: Clear to auscultation. Respiratory effort normal.  On room air. Cardiovascular system: S1 & S2 heard, RRR. No pedal edema. Gastrointestinal system: Abdomen is nondistended, soft and nontender. No organomegaly or masses felt. Normal bowel sounds heard. Foley catheter with muddy urine. Central nervous system: Alert and oriented. No focal neurological deficits. Extremities: Symmetric 5 x 5 power. Skin: No rashes, lesions or ulcers Psychiatry: Judgement and insight appear normal. Mood & affect appropriate.     Data Reviewed: I have personally reviewed following labs and imaging studies  CBC: Recent Labs  Lab 01/23/23 1547 01/23/23 2001 01/23/23 2345 01/23/23 2358  WBC 14.2*  --  17.6*  --   NEUTROABS 12.2*  --  15.3*  --   HGB 8.4* 9.2* 7.6* 7.5*  HCT 24.1* 27.0* 22.0* 22.0*  MCV 94.9  --  97.3  --   PLT 116*  --  84*  --    Basic Metabolic  Panel: Recent Labs  Lab 01/23/23 1547 01/23/23 2001 01/23/23 2358  NA 129* 134* 136  K 5.1 4.9 5.6*  CL 102 104  --   CO2 18*  --   --   GLUCOSE 117* 104*  --   BUN 96* 92*  --   CREATININE 14.05* 12.40*  --   CALCIUM 8.2*  --   --    GFR: Estimated Creatinine Clearance: 8 mL/min (A) (by C-G formula based on SCr of 12.4 mg/dL (H)). Liver Function Tests: Recent Labs  Lab 01/23/23 1547  AST 25  ALT 12  ALKPHOS 45  BILITOT 0.8  PROT 6.2*  ALBUMIN 2.9*   No results for input(s): "LIPASE", "AMYLASE" in the last 168 hours. Recent Labs  Lab 01/23/23 2345  AMMONIA 28   Coagulation Profile: Recent Labs  Lab 01/23/23 1547  INR 1.8*   Cardiac Enzymes: Recent Labs  Lab 01/23/23 2345 01/24/23 0419  CKTOTAL 575* 459*   BNP (last 3 results) No results for input(s): "PROBNP" in the last 8760 hours. HbA1C: No results for input(s): "HGBA1C" in the last 72 hours. CBG: No results for input(s): "GLUCAP" in the last 168 hours. Lipid Profile: No results for input(s): "CHOL", "HDL", "LDLCALC", "TRIG", "CHOLHDL", "LDLDIRECT" in the last 72 hours. Thyroid Function Tests: Recent Labs    01/23/23 2345  TSH 4.894*   Anemia Panel: Recent Labs    01/23/23 2345  VITAMINB12 416  FOLATE 22.4  FERRITIN 784*  TIBC 182*  IRON 25*  RETICCTPCT 2.5   Sepsis Labs: Recent Labs  Lab 01/23/23 1543 01/23/23 1954 01/23/23 2345 01/24/23 0419  PROCALCITON  --   --  33.97  --   LATICACIDVEN 1.5 2.1*  --  1.6    Recent Results (from the past 240 hour(s))  Resp panel by RT-PCR (RSV, Flu A&B, Covid) Anterior Nasal Swab     Status: None   Collection Time: 01/23/23  3:18 PM   Specimen: Anterior Nasal Swab  Result Value Ref Range Status   SARS Coronavirus 2 by RT PCR NEGATIVE NEGATIVE Final   Influenza A by PCR NEGATIVE NEGATIVE Final   Influenza B by PCR NEGATIVE NEGATIVE Final    Comment: (NOTE) The Xpert Xpress SARS-CoV-2/FLU/RSV plus assay is intended as an aid in the  diagnosis of influenza from Nasopharyngeal swab specimens and should not be used as a sole basis for treatment. Nasal washings and aspirates are unacceptable for Xpert Xpress SARS-CoV-2/FLU/RSV testing.  Fact Sheet for Patients: EntrepreneurPulse.com.au  Fact Sheet for Healthcare Providers: IncredibleEmployment.be  This test is not yet approved or cleared by the [place] FDA and has been authorized for detection and/or diagnosis of SARS-CoV-2 by FDA under an Emergency Use Authorization (EUA). This EUA will remain in effect (meaning this test can be used) for the duration of the COVID-19 declaration under Section 564(b)(1) of the Act, 21 U.S.C. section 360bbb-3(b)(1), unless the authorization is terminated or revoked.     Resp Syncytial Virus by PCR NEGATIVE NEGATIVE Final    Comment: (NOTE) Fact Sheet for Patients: EntrepreneurPulse.com.au  Fact Sheet for Healthcare Providers: IncredibleEmployment.be  This  test is not yet approved or cleared by the [place] and has been authorized for detection and/or diagnosis of SARS-CoV-2 by FDA under an Emergency Use Authorization (EUA). This EUA will remain in effect (meaning this test can be used) for the duration of the COVID-19 declaration under Section 564(b)(1) of the Act, 21 U.S.C. section 360bbb-3(b)(1), unless the authorization is terminated or revoked.  Performed at [facility], [place] [address] [place], [place] [address]   Blood Culture (routine x 2)     Status: None (Preliminary result)   Collection Time: 01/23/23  3:43 PM   Specimen: BLOOD  Result Value Ref Range Status   Specimen Description BLOOD RIGHT ANTECUBITAL  Final   Special Requests   Final    BOTTLES DRAWN AEROBIC AND ANAEROBIC Blood Culture adequate volume   Culture  Setup Time   Final    GRAM NEGATIVE RODS IN BOTH AEROBIC AND ANAEROBIC BOTTLES Organism ID to  follow CRITICAL RESULT CALLED TO, READ BACK BY AND VERIFIED WITH: T RUDISILL,PHARMD'@0537'$  01/24/23 [place] Performed at [facility], [place] [address], [place], [place] [address]    Culture GRAM NEGATIVE RODS  Final   Report Status PENDING  Incomplete  Blood Culture ID Panel (Reflexed)     Status: Abnormal   Collection Time: 01/23/23  3:43 PM  Result Value Ref Range Status   Enterococcus faecalis NOT DETECTED NOT DETECTED Final   Enterococcus Faecium NOT DETECTED NOT DETECTED Final   Listeria monocytogenes NOT DETECTED NOT DETECTED Final   Staphylococcus species NOT DETECTED NOT DETECTED Final   Staphylococcus aureus (BCID) NOT DETECTED NOT DETECTED Final   Staphylococcus epidermidis NOT DETECTED NOT DETECTED Final   Staphylococcus lugdunensis NOT DETECTED NOT DETECTED Final   Streptococcus species NOT DETECTED NOT DETECTED Final   Streptococcus agalactiae NOT DETECTED NOT DETECTED Final   Streptococcus pneumoniae NOT DETECTED NOT DETECTED Final   Streptococcus pyogenes NOT DETECTED NOT DETECTED Final   A.calcoaceticus-baumannii NOT DETECTED NOT DETECTED Final   Bacteroides fragilis NOT DETECTED NOT DETECTED Final   Enterobacterales DETECTED (A) NOT DETECTED Final    Comment: Enterobacterales represent a large order of gram negative bacteria, not a single organism. CRITICAL RESULT CALLED TO, READ BACK BY AND VERIFIED WITH: T RUDISILL,PHARMD'@0538'$  01/24/23 [place]    Enterobacter cloacae complex NOT DETECTED NOT DETECTED Final   Escherichia coli NOT DETECTED NOT DETECTED Final   Klebsiella aerogenes NOT DETECTED NOT DETECTED Final   Klebsiella oxytoca NOT DETECTED NOT DETECTED Final   Klebsiella pneumoniae DETECTED (A) NOT DETECTED Final    Comment: CRITICAL RESULT CALLED TO, READ BACK BY AND VERIFIED WITH: T RUDISILL,PHARMD'@0538'$  01/24/23 [place]    Proteus species NOT DETECTED NOT DETECTED Final   Salmonella species NOT DETECTED NOT DETECTED Final   Serratia marcescens NOT DETECTED NOT  DETECTED Final   Haemophilus influenzae NOT DETECTED NOT DETECTED Final   Neisseria meningitidis NOT DETECTED NOT DETECTED Final   Pseudomonas aeruginosa NOT DETECTED NOT DETECTED Final   Stenotrophomonas maltophilia NOT DETECTED NOT DETECTED Final   Candida albicans NOT DETECTED NOT DETECTED Final   Candida auris NOT DETECTED NOT DETECTED Final   Candida glabrata NOT DETECTED NOT DETECTED Final   Candida krusei NOT DETECTED NOT DETECTED Final   Candida parapsilosis NOT DETECTED NOT DETECTED Final   Candida tropicalis NOT DETECTED NOT DETECTED Final   Cryptococcus neoformans/gattii NOT DETECTED NOT DETECTED Final   CTX-M ESBL NOT DETECTED NOT DETECTED Final   Carbapenem resistance IMP NOT  DETECTED NOT DETECTED Final   Carbapenem resistance KPC NOT DETECTED NOT DETECTED Final   Carbapenem resistance NDM NOT DETECTED NOT DETECTED Final   Carbapenem resist OXA 48 LIKE NOT DETECTED NOT DETECTED Final   Carbapenem resistance VIM NOT DETECTED NOT DETECTED Final    Comment: Performed at [facility], [place] [address], [place], [place] [address]  Blood Culture (routine x 2)     Status: None (Preliminary result)   Collection Time: 01/23/23  3:49 PM   Specimen: BLOOD  Result Value Ref Range Status   Specimen Description BLOOD BLOOD RIGHT WRIST  Final   Special Requests   Final    BOTTLES DRAWN AEROBIC AND ANAEROBIC Blood Culture adequate volume   Culture  Setup Time   Final    GRAM NEGATIVE RODS IN BOTH AEROBIC AND ANAEROBIC BOTTLES CRITICAL VALUE NOTED.  VALUE IS CONSISTENT WITH PREVIOUSLY REPORTED AND CALLED VALUE. Performed at [facility], [place] [address] [place], [place] [address]    Culture GRAM NEGATIVE RODS  Final   Report Status PENDING  Incomplete         Radiology Studies: VAS [place] LOWER EXTREMITY VENOUS (DVT) (7a-7p)  Result Date: 01/24/2023  Lower Venous DVT Study Patient Name:  ADRYEN COOKSON  Date of Exam:   01/23/2023 Medical Rec #: [id number]              Accession #:    [id number] Date of Birth: [birth year]             Patient Gender: M Patient Age:   10 years Exam Location:  [facility] Procedure:      VAS [place] LOWER EXTREMITY VENOUS (DVT) Referring Phys: Garnette Gunner --------------------------------------------------------------------------------  Indications: Edema.  Risk Factors: Surgery. Limitations: Poor ultrasound/tissue interface. Comparison Study: No prior studies. Performing Technologist: Oliver Hum RVT  Examination Guidelines: A complete evaluation includes B-mode imaging, spectral Doppler, color Doppler, and power Doppler as needed of all accessible portions of each vessel. Bilateral testing is considered an integral part of a complete examination. Limited examinations for reoccurring indications may be performed as noted. The reflux portion of the exam is performed with the patient in reverse Trendelenburg.  +-----+---------------+---------+-----------+----------+--------------+ RIGHTCompressibilityPhasicitySpontaneityPropertiesThrombus Aging +-----+---------------+---------+-----------+----------+--------------+ CFV  Full           Yes      Yes                                 +-----+---------------+---------+-----------+----------+--------------+   +---------+---------------+---------+-----------+----------+--------------+ LEFT     CompressibilityPhasicitySpontaneityPropertiesThrombus Aging +---------+---------------+---------+-----------+----------+--------------+ CFV      Full           Yes      Yes                                 +---------+---------------+---------+-----------+----------+--------------+ SFJ      Full                                                        +---------+---------------+---------+-----------+----------+--------------+ FV Prox  Full                                                        +---------+---------------+---------+-----------+----------+--------------+  FV  Mid   Full           Yes      Yes                                 +---------+---------------+---------+-----------+----------+--------------+ FV DistalFull                                                        +---------+---------------+---------+-----------+----------+--------------+ PFV      Full                                                        +---------+---------------+---------+-----------+----------+--------------+ POP      Full           Yes      Yes                                 +---------+---------------+---------+-----------+----------+--------------+ PTV      Full                                                        +---------+---------------+---------+-----------+----------+--------------+ PERO     Full                                                        +---------+---------------+---------+-----------+----------+--------------+     Summary: RIGHT: - No evidence of common femoral vein obstruction.  LEFT: - There is no evidence of deep vein thrombosis in the lower extremity.  - No cystic structure found in the popliteal fossa.  *See table(s) above for measurements and observations. Electronically signed by Jamelle Haring on 01/24/2023 at 7:31:58 AM.    Final    MR LUMBAR SPINE WO CONTRAST  Result Date: 01/23/2023 CLINICAL DATA:  Back pain EXAM: MRI CERVICAL, THORACIC AND LUMBAR SPINE WITHOUT CONTRAST TECHNIQUE: Multiplanar and multiecho pulse sequences of the cervical spine, to include the craniocervical junction and cervicothoracic junction, and thoracic and lumbar spine, were obtained without intravenous contrast. COMPARISON:  09/18/2022 FINDINGS: MRI CERVICAL SPINE FINDINGS Alignment: Physiologic. Vertebrae: C3-6 ACDF.  No acute abnormality. Cord: Myelomalacia at the C3-4 level, unchanged. Posterior Fossa, vertebral arteries, paraspinal tissues: Negative. Disc levels: C1-2: Unremarkable. C2-3: Mild facet hypertrophy with small disc bulge and  uncinate spurring. There is no spinal canal stenosis. Bilateral neural foraminal stenosis. C3-4: ACDF. Unchanged small disc bulge. Unchanged severe spinal canal stenosis. No neural foraminal stenosis. C4-5: ACDF. Mild facet hypertrophy. There is no spinal canal stenosis. No neural foraminal stenosis. C5-6: ACDF with small disc bulge and uncinate spurring. Unchanged mild spinal canal stenosis. Unchanged moderate bilateral neural foraminal stenosis. C6-7: Small disc bulge. There is no spinal canal stenosis. No neural foraminal stenosis. C7-T1: Normal disc space and  facet joints. There is no spinal canal stenosis. No neural foraminal stenosis. MRI THORACIC SPINE FINDINGS Alignment:  Physiologic. Vertebrae: No fracture, evidence of discitis, or bone lesion. Modic type 2 endplate signal changes at L4-5, unchanged. Cord:  Normal signal and morphology. Paraspinal and other soft tissues: Negative. Disc levels: There is mild multilevel degenerative disc disease, greatest at T9-10 where there is mild spinal canal stenosis. No stenosis at any other level. MRI LUMBAR SPINE FINDINGS Segmentation:  Standard. Alignment:  Physiologic. Vertebrae:  No fracture, evidence of discitis, or bone lesion. Conus medullaris and cauda equina: Conus extends to the T12 level. Conus and cauda equina appear normal. Paraspinal and other soft tissues: Negative Disc levels: L1-L2: Normal disc space and facet joints. No spinal canal stenosis. No neural foraminal stenosis. L2-L3: Small disc bulge, unchanged. Unchanged mild spinal canal stenosis. No neural foraminal stenosis. L3-L4: Unchanged small disc bulge. Mild spinal canal stenosis. No neural foraminal stenosis. L4-L5: Unchanged intermediate sized disc bulge, right asymmetric. No spinal canal stenosis. Unchanged mild right neural foraminal stenosis. L5-S1: Normal disc space and facet joints. No spinal canal stenosis. No neural foraminal stenosis. Visualized sacrum: Normal. IMPRESSION: 1. C3-6 ACDF  with unchanged severe spinal canal stenosis at C3-4 and focal myelomalacia. 2. Unchanged moderate bilateral C5-6 neural foraminal stenosis. 3. Mild multilevel thoracic degenerative disc disease with mild spinal canal stenosis at T9-10. 4. Unchanged mild spinal canal stenosis at L2-3, L3-4 and L4-5. 5. Unchanged mild right L4-5 neural foraminal stenosis. Electronically Signed   By: Ulyses Jarred M.D.   On: 01/23/2023 22:33   MR THORACIC SPINE WO CONTRAST  Result Date: 01/23/2023 CLINICAL DATA:  Back pain EXAM: MRI CERVICAL, THORACIC AND LUMBAR SPINE WITHOUT CONTRAST TECHNIQUE: Multiplanar and multiecho pulse sequences of the cervical spine, to include the craniocervical junction and cervicothoracic junction, and thoracic and lumbar spine, were obtained without intravenous contrast. COMPARISON:  09/18/2022 FINDINGS: MRI CERVICAL SPINE FINDINGS Alignment: Physiologic. Vertebrae: C3-6 ACDF.  No acute abnormality. Cord: Myelomalacia at the C3-4 level, unchanged. Posterior Fossa, vertebral arteries, paraspinal tissues: Negative. Disc levels: C1-2: Unremarkable. C2-3: Mild facet hypertrophy with small disc bulge and uncinate spurring. There is no spinal canal stenosis. Bilateral neural foraminal stenosis. C3-4: ACDF. Unchanged small disc bulge. Unchanged severe spinal canal stenosis. No neural foraminal stenosis. C4-5: ACDF. Mild facet hypertrophy. There is no spinal canal stenosis. No neural foraminal stenosis. C5-6: ACDF with small disc bulge and uncinate spurring. Unchanged mild spinal canal stenosis. Unchanged moderate bilateral neural foraminal stenosis. C6-7: Small disc bulge. There is no spinal canal stenosis. No neural foraminal stenosis. C7-T1: Normal disc space and facet joints. There is no spinal canal stenosis. No neural foraminal stenosis. MRI THORACIC SPINE FINDINGS Alignment:  Physiologic. Vertebrae: No fracture, evidence of discitis, or bone lesion. Modic type 2 endplate signal changes at L4-5,  unchanged. Cord:  Normal signal and morphology. Paraspinal and other soft tissues: Negative. Disc levels: There is mild multilevel degenerative disc disease, greatest at T9-10 where there is mild spinal canal stenosis. No stenosis at any other level. MRI LUMBAR SPINE FINDINGS Segmentation:  Standard. Alignment:  Physiologic. Vertebrae:  No fracture, evidence of discitis, or bone lesion. Conus medullaris and cauda equina: Conus extends to the T12 level. Conus and cauda equina appear normal. Paraspinal and other soft tissues: Negative Disc levels: L1-L2: Normal disc space and facet joints. No spinal canal stenosis. No neural foraminal stenosis. L2-L3: Small disc bulge, unchanged. Unchanged mild spinal canal stenosis. No neural foraminal stenosis. L3-L4: Unchanged small disc bulge.  Mild spinal canal stenosis. No neural foraminal stenosis. L4-L5: Unchanged intermediate sized disc bulge, right asymmetric. No spinal canal stenosis. Unchanged mild right neural foraminal stenosis. L5-S1: Normal disc space and facet joints. No spinal canal stenosis. No neural foraminal stenosis. Visualized sacrum: Normal. IMPRESSION: 1. C3-6 ACDF with unchanged severe spinal canal stenosis at C3-4 and focal myelomalacia. 2. Unchanged moderate bilateral C5-6 neural foraminal stenosis. 3. Mild multilevel thoracic degenerative disc disease with mild spinal canal stenosis at T9-10. 4. Unchanged mild spinal canal stenosis at L2-3, L3-4 and L4-5. 5. Unchanged mild right L4-5 neural foraminal stenosis. Electronically Signed   By: Ulyses Jarred M.D.   On: 01/23/2023 22:33   MR Cervical Spine Wo Contrast  Result Date: 01/23/2023 CLINICAL DATA:  Back pain EXAM: MRI CERVICAL, THORACIC AND LUMBAR SPINE WITHOUT CONTRAST TECHNIQUE: Multiplanar and multiecho pulse sequences of the cervical spine, to include the craniocervical junction and cervicothoracic junction, and thoracic and lumbar spine, were obtained without intravenous contrast. COMPARISON:   09/18/2022 FINDINGS: MRI CERVICAL SPINE FINDINGS Alignment: Physiologic. Vertebrae: C3-6 ACDF.  No acute abnormality. Cord: Myelomalacia at the C3-4 level, unchanged. Posterior Fossa, vertebral arteries, paraspinal tissues: Negative. Disc levels: C1-2: Unremarkable. C2-3: Mild facet hypertrophy with small disc bulge and uncinate spurring. There is no spinal canal stenosis. Bilateral neural foraminal stenosis. C3-4: ACDF. Unchanged small disc bulge. Unchanged severe spinal canal stenosis. No neural foraminal stenosis. C4-5: ACDF. Mild facet hypertrophy. There is no spinal canal stenosis. No neural foraminal stenosis. C5-6: ACDF with small disc bulge and uncinate spurring. Unchanged mild spinal canal stenosis. Unchanged moderate bilateral neural foraminal stenosis. C6-7: Small disc bulge. There is no spinal canal stenosis. No neural foraminal stenosis. C7-T1: Normal disc space and facet joints. There is no spinal canal stenosis. No neural foraminal stenosis. MRI THORACIC SPINE FINDINGS Alignment:  Physiologic. Vertebrae: No fracture, evidence of discitis, or bone lesion. Modic type 2 endplate signal changes at L4-5, unchanged. Cord:  Normal signal and morphology. Paraspinal and other soft tissues: Negative. Disc levels: There is mild multilevel degenerative disc disease, greatest at T9-10 where there is mild spinal canal stenosis. No stenosis at any other level. MRI LUMBAR SPINE FINDINGS Segmentation:  Standard. Alignment:  Physiologic. Vertebrae:  No fracture, evidence of discitis, or bone lesion. Conus medullaris and cauda equina: Conus extends to the T12 level. Conus and cauda equina appear normal. Paraspinal and other soft tissues: Negative Disc levels: L1-L2: Normal disc space and facet joints. No spinal canal stenosis. No neural foraminal stenosis. L2-L3: Small disc bulge, unchanged. Unchanged mild spinal canal stenosis. No neural foraminal stenosis. L3-L4: Unchanged small disc bulge. Mild spinal canal stenosis.  No neural foraminal stenosis. L4-L5: Unchanged intermediate sized disc bulge, right asymmetric. No spinal canal stenosis. Unchanged mild right neural foraminal stenosis. L5-S1: Normal disc space and facet joints. No spinal canal stenosis. No neural foraminal stenosis. Visualized sacrum: Normal. IMPRESSION: 1. C3-6 ACDF with unchanged severe spinal canal stenosis at C3-4 and focal myelomalacia. 2. Unchanged moderate bilateral C5-6 neural foraminal stenosis. 3. Mild multilevel thoracic degenerative disc disease with mild spinal canal stenosis at T9-10. 4. Unchanged mild spinal canal stenosis at L2-3, L3-4 and L4-5. 5. Unchanged mild right L4-5 neural foraminal stenosis. Electronically Signed   By: Ulyses Jarred M.D.   On: 01/23/2023 22:33   CT ABDOMEN PELVIS WO CONTRAST  Result Date: 01/23/2023 CLINICAL DATA:  Kidney failure, acute renal failure EXAM: CT ABDOMEN AND PELVIS WITHOUT CONTRAST TECHNIQUE: Multidetector CT imaging of the abdomen and pelvis was performed following the  standard protocol without IV contrast. RADIATION DOSE REDUCTION: This exam was performed according to the departmental dose-optimization program which includes automated exposure control, adjustment of the mA and/or kV according to patient size and/or use of iterative reconstruction technique. COMPARISON:  CT 03/26/2018. FINDINGS: Lower chest: No acute abnormality. Hepatobiliary: No focal liver abnormality is seen. Decompressed gallbladder with cholelithiasis. Pancreas: Unremarkable. No pancreatic ductal dilatation or surrounding inflammatory changes. Spleen: Normal in size without focal abnormality. Adrenals/Urinary Tract: Adrenal glands are unremarkable. Moderate bilateral hydronephrosis. No nephroureterolithiasis. Mild perinephric stranding bilaterally. Markedly dilated urinary bladder. Stomach/Bowel: The stomach is within normal limits. There is no evidence of bowel obstruction.The appendix is normal. Vascular/Lymphatic: Aortoiliac  atherosclerosis. No AAA. No lymphadenopathy. Retroaortic left renal vein. Reproductive: Enlarged prostate gland. Bilateral scrotal varicoceles. Other: Small fat containing umbilical hernia. No abdominopelvic ascites. Musculoskeletal: No acute osseous abnormality. No suspicious osseous lesion. Prior left hip arthroplasty. Normal alignment. Moderate right hip osteoarthritis. Degenerative changes of the spine most prominent at L4-L5. IMPRESSION: Findings suggestive of bladder outlet obstruction due to an enlarged prostate gland. Markedly distended urinary bladder and moderate bilateral hydroureteronephrosis. No other acute findings in the abdomen or pelvis. Additional chronic findings as described above. Electronically Signed   By: Maurine Simmering M.D.   On: 01/23/2023 18:46   DG Hip Unilat With Pelvis 2-3 Views Left  Result Date: 01/23/2023 CLINICAL DATA:  Sepsis, surgery EXAM: DG HIP (WITH OR WITHOUT PELVIS) 3V LEFT COMPARISON:  08/14/2021. FINDINGS: Status post left total hip arthroplasty. No periprosthetic lucency to suggest loosening. No acute fracture, dislocation or subluxation. Pelvic ring intact. IMPRESSION: Unremarkable appearance status post left total hip arthroplasty. Electronically Signed   By: Sammie Bench M.D.   On: 01/23/2023 17:29   DG Chest 1 View  Result Date: 01/23/2023 CLINICAL DATA:  Recent surgery, sepsis EXAM: CHEST  1 VIEW COMPARISON:  None Available. FINDINGS: The heart is enlarged. The lungs are clear. There is no pleural effusion or pneumothorax. No acute fractures are seen. IMPRESSION: Cardiomegaly. No acute pulmonary process. Electronically Signed   By: Ronney Asters M.D.   On: 01/23/2023 17:29        Scheduled Meds:  [START ON 01/25/2023] levothyroxine  200 mcg Oral QAC breakfast   tamsulosin  0.4 mg Oral Daily   Continuous Infusions:  sodium chloride     cefTRIAXone (ROCEPHIN)  IV     lactated ringers 125 mL/hr at 01/24/23 0811     LOS: 1 day    Time spent: 35  minutes.    Barb Merino, MD [facility] Pager [phone]

## 2023-01-24 NOTE — Progress Notes (Signed)
PHARMACY - PHYSICIAN COMMUNICATION CRITICAL VALUE ALERT - BLOOD CULTURE IDENTIFICATION (BCID)  Alex West is an 67 y.o. male who presented to [facility] on 01/23/2023 with a chief complaint of  fatigue  Assessment:  Klebsiella pneumoniae 4/4  Name of physician (or Provider) Contacted: Bridgett Larsson  Current antibiotics: Ceftriaxone  Changes to prescribed antibiotics recommended:  Patient is on recommended antibiotics - No changes needed  Results for orders placed or performed during the hospital encounter of 01/23/23  Blood Culture ID Panel (Reflexed) (Collected: 01/23/2023  3:43 PM)  Result Value Ref Range   Enterococcus faecalis NOT DETECTED NOT DETECTED   Enterococcus Faecium NOT DETECTED NOT DETECTED   Listeria monocytogenes NOT DETECTED NOT DETECTED   Staphylococcus species NOT DETECTED NOT DETECTED   Staphylococcus aureus (BCID) NOT DETECTED NOT DETECTED   Staphylococcus epidermidis NOT DETECTED NOT DETECTED   Staphylococcus lugdunensis NOT DETECTED NOT DETECTED   Streptococcus species NOT DETECTED NOT DETECTED   Streptococcus agalactiae NOT DETECTED NOT DETECTED   Streptococcus pneumoniae NOT DETECTED NOT DETECTED   Streptococcus pyogenes NOT DETECTED NOT DETECTED   A.calcoaceticus-baumannii NOT DETECTED NOT DETECTED   Bacteroides fragilis NOT DETECTED NOT DETECTED   Enterobacterales DETECTED (A) NOT DETECTED   Enterobacter cloacae complex NOT DETECTED NOT DETECTED   Escherichia coli NOT DETECTED NOT DETECTED   Klebsiella aerogenes NOT DETECTED NOT DETECTED   Klebsiella oxytoca NOT DETECTED NOT DETECTED   Klebsiella pneumoniae DETECTED (A) NOT DETECTED   Proteus species NOT DETECTED NOT DETECTED   Salmonella species NOT DETECTED NOT DETECTED   Serratia marcescens NOT DETECTED NOT DETECTED   Haemophilus influenzae NOT DETECTED NOT DETECTED   Neisseria meningitidis NOT DETECTED NOT DETECTED   Pseudomonas aeruginosa NOT DETECTED NOT DETECTED   Stenotrophomonas maltophilia  NOT DETECTED NOT DETECTED   Candida albicans NOT DETECTED NOT DETECTED   Candida auris NOT DETECTED NOT DETECTED   Candida glabrata NOT DETECTED NOT DETECTED   Candida krusei NOT DETECTED NOT DETECTED   Candida parapsilosis NOT DETECTED NOT DETECTED   Candida tropicalis NOT DETECTED NOT DETECTED   Cryptococcus neoformans/gattii NOT DETECTED NOT DETECTED   CTX-M ESBL NOT DETECTED NOT DETECTED   Carbapenem resistance IMP NOT DETECTED NOT DETECTED   Carbapenem resistance KPC NOT DETECTED NOT DETECTED   Carbapenem resistance NDM NOT DETECTED NOT DETECTED   Carbapenem resist OXA 48 LIKE NOT DETECTED NOT DETECTED   Carbapenem resistance VIM NOT DETECTED NOT DETECTED    Alex West 01/24/2023  5:46 AM

## 2023-01-24 NOTE — Evaluation (Signed)
Physical Therapy Evaluation Patient Details Name: Alex West MRN: [id number] DOB: 1956/10/18 Today's Date: 01/24/2023  History of Present Illness  67yo male presenting with c/o fatigue, temperature up to 104, weakness, urinary incontinence, and inability to walk. Admitted with AKI, sepsis, lower UTI. PMH BPH, HLD, MVR, thyroid disease, vertigo, ACDF, anterior THA, autoimmune hepatitis  Clinical Impression   Patient received in bed, pleasant and cooperative with PT. Generally able to mobilize on a min guard basis, did need a bit of help getting L LE back into bed at EOS but reports he is mod(I) with this at home with leg lifter. Spouse present and very supportive. Able to maintain balance well with dynamic activities today, just feeling very stiff and tight. Seems to be close to general post-op baseline. Left in bed with all needs met, family present and RN aware of pt status/precautions for recent THR. Recommend return to [place] when medically ready for DC home.        Recommendations for follow up therapy are one component of a multi-disciplinary discharge planning process, led by the attending physician.  Recommendations may be updated based on patient status, additional functional criteria and insurance authorization.  Follow Up Recommendations Home health PT      Assistance Recommended at Discharge Intermittent Supervision/Assistance  Patient can return home with the following  A little help with walking and/or transfers;A little help with bathing/dressing/bathroom;Assist for transportation;Assistance with cooking/housework;Help with stairs or ramp for entrance    Equipment Recommendations None recommended by PT (well equipped)  Recommendations for Other Services       Functional Status Assessment Patient has had a recent decline in their functional status and demonstrates the ability to make significant improvements in function in a reasonable and predictable amount of time.      Precautions / Restrictions Precautions Precautions: None Restrictions Weight Bearing Restrictions: No Other Position/Activity Restrictions: wbat      Mobility  Bed Mobility Overal bed mobility: Needs Assistance Bed Mobility: Supine to Sit, Sit to Supine     Supine to sit: Min guard, HOB elevated Sit to supine: Min assist   General bed mobility comments: Min guard/increased time to get to EOB, needed MinA for supine to sit to manage L LE (reports he is able to do this at home Mod(I) with leg lifter)    Transfers Overall transfer level: Needs assistance Equipment used: Rolling walker (2 wheels) Transfers: Sit to/from Stand, Bed to chair/wheelchair/BSC Sit to Stand: Min guard, From elevated surface           General transfer comment: Min guard for safety, VCs for sequencing    Ambulation/Gait Ambulation/Gait assistance: Supervision Gait Distance (Feet): 40 Feet Assistive device: Rolling walker (2 wheels) Gait Pattern/deviations: Step-through pattern, Trunk flexed, Trendelenburg       General Gait Details: able to ambulate well with RW, assist really only given for line management  Stairs            Wheelchair Mobility    Modified Rankin (Stroke Patients Only)       Balance Overall balance assessment: Needs assistance Sitting-balance support: Feet supported, No upper extremity supported Sitting balance-Leahy Scale: Good     Standing balance support: Reliant on assistive device for balance, During functional activity, Bilateral upper extremity supported Standing balance-Leahy Scale: Fair                               Pertinent Vitals/Pain Pain Assessment  Pain Assessment: No/denies pain Pain Score: 0-No pain    Home Living Family/patient expects to be discharged to:: Private residence Living Arrangements: Spouse/significant other Available Help at Discharge: Family;Available 24 hours/day Type of Home: House Home Access: Stairs  to enter Entrance Stairs-Rails: Left Entrance Stairs-Number of Steps: 3   Home Layout: Multi-level;Able to live on main level with bedroom/bathroom Home Equipment: Rolling Walker (2 wheels);Cane - single point;Toilet riser      Prior Function Prior Level of Function : Independent/Modified Independent;Driving             Mobility Comments: SPC ADLs Comments: IND     Hand Dominance        Extremity/Trunk Assessment   Upper Extremity Assessment Upper Extremity Assessment: Defer to OT evaluation    Lower Extremity Assessment Lower Extremity Assessment: Generalized weakness       Communication   Communication: No difficulties  Cognition Arousal/Alertness: Awake/alert Behavior During Therapy: WFL for tasks assessed/performed Overall Cognitive Status: Within Functional Limits for tasks assessed                                          General Comments General comments (skin integrity, edema, etc.): VSS RA, wife present during session    Exercises     Assessment/Plan    PT Assessment Patient needs continued PT services  PT Problem List Decreased strength;Decreased range of motion;Decreased activity tolerance;Decreased balance;Decreased mobility;Decreased coordination;Pain       PT Treatment Interventions DME instruction;Gait training;Stair training;Functional mobility training;Therapeutic activities;Therapeutic exercise;Balance training;Neuromuscular re-education;Patient/family education    PT Goals (Current goals can be found in the Care Plan section)  Acute Rehab PT Goals Patient Stated Goal: To walk without pain PT Goal Formulation: With patient Time For Goal Achievement: 01/13/23 Potential to Achieve Goals: Good    Frequency Min 3X/week     Co-evaluation               AM-PAC PT "6 Clicks" Mobility  Outcome Measure Help needed turning from your back to your side while in a flat bed without using bedrails?: None Help needed  moving from lying on your back to sitting on the side of a flat bed without using bedrails?: A Little Help needed moving to and from a bed to a chair (including a wheelchair)?: A Little Help needed standing up from a chair using your arms (e.g., wheelchair or bedside chair)?: A Little Help needed to walk in hospital room?: A Little Help needed climbing 3-5 steps with a railing? : A Little 6 Click Score: 19    End of Session   Activity Tolerance: Patient tolerated treatment well;No increased pain Patient left: in bed;with call bell/phone within reach;with family/visitor present Nurse Communication: Mobility status PT Visit Diagnosis: Pain;Difficulty in walking, not elsewhere classified (R26.2);Muscle weakness (generalized) (M62.81) Pain - Right/Left: Left Pain - part of body: Hip    Time: 3220-2542 PT Time Calculation (min) (ACUTE ONLY): 41 min   Charges:   PT Evaluation $PT Eval Moderate Complexity: 1 Mod PT Treatments $Gait Training: 8-22 mins $Therapeutic Activity: 8-22 mins      Deniece Ree PT DPT PN2

## 2023-01-24 NOTE — TOC Initial Note (Signed)
Transition of Care [facility]) - Initial/Assessment Note    Patient Details  Name: Alex West MRN: [id number] Date of Birth: 1956/05/13  [facility] CM/SW Contact:    Verdell Carmine, RN Phone Number: 01/24/2023, 2:55 PM  Clinical Narrative:                 Patient presented with fever, just DC on 1/15 with THR. Is active with [facility] for [place]. Orders in for continuation of HH.  Lives with wife who can assist after DC  TOC will follow for further recommendations, and transitions of care  Expected Discharge Plan: [place] Barriers to Discharge: Continued Medical Work up   Patient Goals and CMS Choice            Expected Discharge Plan and Services   Discharge Planning Services: CM Consult Post Acute Care Choice: [place] arrangements for the past 2 months: Single Family Home                           HH Arranged: PT, OT HH Agency: [facility]     Representative spoke with at [place]: Is active with [facility]  Prior Living Arrangements/Services Living arrangements for the past 2 months: [place] Lives with:: Spouse Patient language and need for interpreter reviewed:: Yes Do you feel safe going back to the place where you live?: Yes      Need for Family Participation in Patient Care: Yes (Comment) Care giver support system in place?: Yes (comment) Current home services: Home PT, DME Criminal Activity/Legal Involvement Pertinent to Current Situation/Hospitalization: No - Comment as needed  Activities of Daily Living      Permission Sought/Granted                  Emotional Assessment       Orientation: : Oriented to Self, Oriented to Place, Oriented to  Time Alcohol / Substance Use: Not Applicable Psych Involvement: No (comment)  Admission diagnosis:  Lower urinary tract infectious disease [N39.0] Debility [R53.81] Weakness [R53.1] AKI (acute kidney injury) ([place])  [N17.9] Acute renal failure, unspecified acute renal failure type ([place]) [N17.9] Sepsis, due to unspecified organism, unspecified whether acute organ dysfunction present [facility]) [A41.9] Patient Active Problem List   Diagnosis Date Noted   UTI (urinary tract infection) 01/24/2023   Sepsis ([place]) 01/24/2023   Bacteremia 01/24/2023   AKI (acute kidney injury) ([place]) 01/23/2023   Arthritis of left hip 01/01/2023   Paresthesia 08/21/2022   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   Gait abnormality 06/18/2022   Cervical myelopathy ([place]) 06/17/2022   Debility 06/06/2022   Neck pain 06/06/2022   OA (osteoarthritis) of hip 07/24/2021   Immunosuppressed status ([place]) 09/05/2014   Hepatitis, autoimmune ([place]) 09/05/2014   PCP:  Lawerance Cruel, MD Pharmacy:   [facility] [place], [address] [place] [address] Phone: [phone] Fax: [phone]     Social Determinants of Health (SDOH) Social History: SDOH Screenings   Tobacco Use: Medium Risk (01/07/2023)   SDOH Interventions:     Readmission Risk Interventions     No data to display

## 2023-01-24 NOTE — Consult Note (Signed)
Reason for Consult: AKI Referring Physician: Roel Cluck, MD  Assessment/Plan: AKI: Creatinine initially 14.05>12.4 from baseline 1.13. CT reveals bladder outlet obstruction, moderate bilateral hydroureteronephrosis, prostate enlargement.  Highly suggestive of obstructive causes and likely in combination with infection. Unfortunately, UOP not recorded. Continue fluids for now, however if UOP decreases, may need to discontinue fluids. Time will tell on whether or not kidney function was ultimately affected by prolonged obstruction.  Avoid nephrotoxic medications including NSAIDs and iodinated intravenous contrast exposure unless the latter is absolutely indicated.   Preferred narcotic agents for pain control are hydromorphone, fentanyl, and methadone. Morphine should not be used.  Avoid Baclofen and avoid oral sodium phosphate and magnesium citrate based laxatives / bowel preps.  Continue strict Input and output monitoring Will monitor the patient closely with you and intervene or adjust therapy as indicated by changes in clinical status/labs  Sepsis: Culture reveals Klebsiella pneumonia, antibiotics per primary service.  Agree with discontinuation of vancomycin. Hypotension: Appears resolved at this time.  BPH: start Flomax.   HPI: Alex West is an 67 y.o. male with pmhx autoimmune hepatitis, BPH, anemia, hypothyroidism and recent left hip replacement.  Presented with worsening lower extremity weakness and new incontinence, febrile to 101.7 and hypotensive concerning for sepsis.  He notes that he had consistent urinary incontinence since his hip surgery (01/06/23) that led him to wearing briefs. He had pelvic discomfort related to likely bladder distension which has been present. States his prostate is checked yearly and he has never had prostate difficulty. Otherwise is comfortable at this time and felt much relief with catheter placement.    Chemistry and CBC: Creatinine, Ser  Date/Time  Value Ref Range Status  01/23/2023 08:01 PM 12.40 (H) 0.61 - 1.24 mg/dL Final  01/23/2023 03:47 PM 14.05 (H) 0.61 - 1.24 mg/dL Final  12/27/2022 02:38 PM 1.13 0.61 - 1.24 mg/dL Final  07/19/2022 11:30 AM 1.35 (H) 0.61 - 1.24 mg/dL Final   Recent Labs  Lab 01/23/23 1547 01/23/23 2001 01/23/23 2358  NA 129* 134* 136  K 5.1 4.9 5.6*  CL 102 104  --   CO2 18*  --   --   GLUCOSE 117* 104*  --   BUN 96* 92*  --   CREATININE 14.05* 12.40*  --   CALCIUM 8.2*  --   --    Recent Labs  Lab 01/23/23 1547 01/23/23 2001 01/23/23 2345 01/23/23 2358  WBC 14.2*  --  17.6*  --   NEUTROABS 12.2*  --  15.3*  --   HGB 8.4* 9.2* 7.6* 7.5*  HCT 24.1* 27.0* 22.0* 22.0*  MCV 94.9  --  97.3  --   PLT 116*  --  84*  --    Liver Function Tests: Recent Labs  Lab 01/23/23 1547  AST 25  ALT 12  ALKPHOS 45  BILITOT 0.8  PROT 6.2*  ALBUMIN 2.9*   No results for input(s): "LIPASE", "AMYLASE" in the last 168 hours. Recent Labs  Lab 01/23/23 2345  AMMONIA 28   Cardiac Enzymes: Recent Labs  Lab 01/23/23 2345 01/24/23 0419  CKTOTAL 575* 459*   Iron Studies:  Recent Labs    01/23/23 2345  IRON 25*  TIBC 182*  FERRITIN 784*   PT/INR: '@LABRCNTIP'$ (inr:5)  Xrays/Other Studies: ) Results for orders placed or performed during the hospital encounter of 01/23/23 (from the past 48 hour(s))  Resp panel by RT-PCR (RSV, Flu A&B, Covid) Anterior Nasal Swab     Status: None   Collection  Time: 01/23/23  3:18 PM   Specimen: Anterior Nasal Swab  Result Value Ref Range   SARS Coronavirus 2 by RT PCR NEGATIVE NEGATIVE   Influenza A by PCR NEGATIVE NEGATIVE   Influenza B by PCR NEGATIVE NEGATIVE    Comment: (NOTE) The Xpert Xpress SARS-CoV-2/FLU/RSV plus assay is intended as an aid in the diagnosis of influenza from Nasopharyngeal swab specimens and should not be used as a sole basis for treatment. Nasal washings and aspirates are unacceptable for Xpert Xpress  SARS-CoV-2/FLU/RSV testing.  Fact Sheet for Patients: EntrepreneurPulse.com.au  Fact Sheet for Healthcare Providers: IncredibleEmployment.be  This test is not yet approved or cleared by the [place] FDA and has been authorized for detection and/or diagnosis of SARS-CoV-2 by FDA under an Emergency Use Authorization (EUA). This EUA will remain in effect (meaning this test can be used) for the duration of the COVID-19 declaration under Section 564(b)(1) of the Act, 21 U.S.C. section 360bbb-3(b)(1), unless the authorization is terminated or revoked.     Resp Syncytial Virus by PCR NEGATIVE NEGATIVE    Comment: (NOTE) Fact Sheet for Patients: EntrepreneurPulse.com.au  Fact Sheet for Healthcare Providers: IncredibleEmployment.be  This test is not yet approved or cleared by the [place] FDA and has been authorized for detection and/or diagnosis of SARS-CoV-2 by FDA under an Emergency Use Authorization (EUA). This EUA will remain in effect (meaning this test can be used) for the duration of the COVID-19 declaration under Section 564(b)(1) of the Act, 21 U.S.C. section 360bbb-3(b)(1), unless the authorization is terminated or revoked.  Performed at [facility], [place] [address] [place], [place] [address]   Lactic acid, plasma     Status: None   Collection Time: 01/23/23  3:43 PM  Result Value Ref Range   Lactic Acid, Venous 1.5 0.5 - 1.9 mmol/L    Comment: Performed at [facility] [address], [place], [place] [address]  Blood Culture (routine x 2)     Status: None (Preliminary result)   Collection Time: 01/23/23  3:43 PM   Specimen: BLOOD  Result Value Ref Range   Specimen Description BLOOD RIGHT ANTECUBITAL    Special Requests      BOTTLES DRAWN AEROBIC AND ANAEROBIC Blood Culture adequate volume   Culture  Setup Time      GRAM NEGATIVE RODS IN BOTH AEROBIC AND ANAEROBIC  BOTTLES Organism ID to follow CRITICAL RESULT CALLED TO, READ BACK BY AND VERIFIED WITH: T RUDISILL,PHARMD'@0537'$  01/24/23 [place] Performed at [facility], [place] [address], [place], [place] [address]    Culture GRAM NEGATIVE RODS    Report Status PENDING   Blood Culture ID Panel (Reflexed)     Status: Abnormal   Collection Time: 01/23/23  3:43 PM  Result Value Ref Range   Enterococcus faecalis NOT DETECTED NOT DETECTED   Enterococcus Faecium NOT DETECTED NOT DETECTED   Listeria monocytogenes NOT DETECTED NOT DETECTED   Staphylococcus species NOT DETECTED NOT DETECTED   Staphylococcus aureus (BCID) NOT DETECTED NOT DETECTED   Staphylococcus epidermidis NOT DETECTED NOT DETECTED   Staphylococcus lugdunensis NOT DETECTED NOT DETECTED   Streptococcus species NOT DETECTED NOT DETECTED   Streptococcus agalactiae NOT DETECTED NOT DETECTED   Streptococcus pneumoniae NOT DETECTED NOT DETECTED   Streptococcus pyogenes NOT DETECTED NOT DETECTED   A.calcoaceticus-baumannii NOT DETECTED NOT DETECTED   Bacteroides fragilis NOT DETECTED NOT DETECTED   Enterobacterales DETECTED (A) NOT DETECTED    Comment: Enterobacterales represent a large order  of gram negative bacteria, not a single organism. CRITICAL RESULT CALLED TO, READ BACK BY AND VERIFIED WITH: T RUDISILL,PHARMD'@0538'$  01/24/23 [place]    Enterobacter cloacae complex NOT DETECTED NOT DETECTED   Escherichia coli NOT DETECTED NOT DETECTED   Klebsiella aerogenes NOT DETECTED NOT DETECTED   Klebsiella oxytoca NOT DETECTED NOT DETECTED   Klebsiella pneumoniae DETECTED (A) NOT DETECTED    Comment: CRITICAL RESULT CALLED TO, READ BACK BY AND VERIFIED WITH: T RUDISILL,PHARMD'@0538'$  01/24/23 [place]    Proteus species NOT DETECTED NOT DETECTED   Salmonella species NOT DETECTED NOT DETECTED   Serratia marcescens NOT DETECTED NOT DETECTED   Haemophilus influenzae NOT DETECTED NOT DETECTED   Neisseria meningitidis NOT DETECTED NOT DETECTED   Pseudomonas  aeruginosa NOT DETECTED NOT DETECTED   Stenotrophomonas maltophilia NOT DETECTED NOT DETECTED   Candida albicans NOT DETECTED NOT DETECTED   Candida auris NOT DETECTED NOT DETECTED   Candida glabrata NOT DETECTED NOT DETECTED   Candida krusei NOT DETECTED NOT DETECTED   Candida parapsilosis NOT DETECTED NOT DETECTED   Candida tropicalis NOT DETECTED NOT DETECTED   Cryptococcus neoformans/gattii NOT DETECTED NOT DETECTED   CTX-M ESBL NOT DETECTED NOT DETECTED   Carbapenem resistance IMP NOT DETECTED NOT DETECTED   Carbapenem resistance KPC NOT DETECTED NOT DETECTED   Carbapenem resistance NDM NOT DETECTED NOT DETECTED   Carbapenem resist OXA 48 LIKE NOT DETECTED NOT DETECTED   Carbapenem resistance VIM NOT DETECTED NOT DETECTED    Comment: Performed at [facility] [address], [place], [place] [address]  Comprehensive metabolic panel     Status: Abnormal   Collection Time: 01/23/23  3:47 PM  Result Value Ref Range   Sodium 129 (L) 135 - 145 mmol/L   Potassium 5.1 3.5 - 5.1 mmol/L   Chloride 102 98 - 111 mmol/L   CO2 18 (L) 22 - 32 mmol/L   Glucose, Bld 117 (H) 70 - 99 mg/dL    Comment: Glucose reference range applies only to samples taken after fasting for at least 8 hours.   BUN 96 (H) 8 - 23 mg/dL   Creatinine, Ser 14.05 (H) 0.61 - 1.24 mg/dL   Calcium 8.2 (L) 8.9 - 10.3 mg/dL   Total Protein 6.2 (L) 6.5 - 8.1 g/dL   Albumin 2.9 (L) 3.5 - 5.0 g/dL   AST 25 15 - 41 U/L   ALT 12 0 - 44 U/L   Alkaline Phosphatase 45 38 - 126 U/L   Total Bilirubin 0.8 0.3 - 1.2 mg/dL   GFR, Estimated 4 (L) >60 mL/min    Comment: (NOTE) Calculated using the CKD-EPI Creatinine Equation (2021)    Anion gap 9 5 - 15    Comment: Performed at [facility], [place] [address] [place], [place] [address]  CBC with Differential     Status: Abnormal   Collection Time: 01/23/23  3:47 PM  Result Value Ref Range   WBC 14.2 (H) 4.0 - 10.5 K/uL   RBC 2.54 (L) 4.22 - 5.81 MIL/uL   Hemoglobin  8.4 (L) 13.0 - 17.0 g/dL   HCT 24.1 (L) 39.0 - 52.0 %   MCV 94.9 80.0 - 100.0 fL   MCH 33.1 26.0 - 34.0 pg   MCHC 34.9 30.0 - 36.0 g/dL   RDW 13.3 11.5 - 15.5 %   Platelets 116 (L) 150 - 400 K/uL    Comment: SPECIMEN CHECKED FOR CLOTS REPEATED TO VERIFY    nRBC 0.2 0.0 -  0.2 %   Neutrophils Relative % 86 %   Neutro Abs 12.2 (H) 1.7 - 7.7 K/uL   Lymphocytes Relative 4 %   Lymphs Abs 0.5 (L) 0.7 - 4.0 K/uL   Monocytes Relative 6 %   Monocytes Absolute 0.9 0.1 - 1.0 K/uL   Eosinophils Relative 1 %   Eosinophils Absolute 0.2 0.0 - 0.5 K/uL   Basophils Relative 0 %   Basophils Absolute 0.1 0.0 - 0.1 K/uL   Immature Granulocytes 3 %   Abs Immature Granulocytes 0.37 (H) 0.00 - 0.07 K/uL    Comment: Performed at [facility] [address], [place], [place] [address]  Protime-INR     Status: Abnormal   Collection Time: 01/23/23  3:47 PM  Result Value Ref Range   Prothrombin Time 20.6 (H) 11.4 - 15.2 seconds   INR 1.8 (H) 0.8 - 1.2    Comment: (NOTE) INR goal varies based on device and disease states. Performed at [facility], [place] [address], [place], [place] [address]   APTT     Status: None   Collection Time: 01/23/23  3:47 PM  Result Value Ref Range   aPTT 36 24 - 36 seconds    Comment: Performed at [facility] [address] [place], [place] [address]  Blood Culture (routine x 2)     Status: None (Preliminary result)   Collection Time: 01/23/23  3:49 PM   Specimen: BLOOD  Result Value Ref Range   Specimen Description BLOOD BLOOD RIGHT WRIST    Special Requests      BOTTLES DRAWN AEROBIC AND ANAEROBIC Blood Culture adequate volume   Culture  Setup Time      GRAM NEGATIVE RODS IN BOTH AEROBIC AND ANAEROBIC BOTTLES CRITICAL VALUE NOTED.  VALUE IS CONSISTENT WITH PREVIOUSLY REPORTED AND CALLED VALUE. Performed at [facility], [place] [address], [place], [place] [address]    Culture GRAM NEGATIVE RODS    Report Status PENDING   Urinalysis, w/  Reflex to Culture (Infection Suspected) -Urine, Clean Catch     Status: Abnormal   Collection Time: 01/23/23  6:40 PM  Result Value Ref Range   Specimen Source URINE, CLEAN CATCH    Color, Urine YELLOW YELLOW   APPearance CLOUDY (A) CLEAR   Specific Gravity, Urine 1.010 1.005 - 1.030   pH 5.0 5.0 - 8.0   Glucose, UA NEGATIVE NEGATIVE mg/dL   Hgb urine dipstick LARGE (A) NEGATIVE   Bilirubin Urine NEGATIVE NEGATIVE   Ketones, ur NEGATIVE NEGATIVE mg/dL   Protein, ur NEGATIVE NEGATIVE mg/dL   Nitrite NEGATIVE NEGATIVE   Leukocytes,Ua LARGE (A) NEGATIVE   RBC / HPF >50 0 - 5 RBC/hpf   WBC, UA >50 0 - 5 WBC/hpf    Comment:        Reflex urine culture not performed if WBC <=10, OR if Squamous epithelial cells >5. If Squamous epithelial cells >5 suggest recollection.    Bacteria, UA MANY (A) NONE SEEN   Squamous Epithelial / HPF 0-5 0 - 5 /HPF   WBC Clumps PRESENT    Mucus PRESENT     Comment: Performed at [facility], [place] [address] [place], [place] [address]  Lactic acid, plasma     Status: Abnormal   Collection Time: 01/23/23  7:54 PM  Result Value Ref Range   Lactic Acid, Venous 2.1 (HH) 0.5 - 1.9 mmol/L    Comment: CRITICAL RESULT CALLED TO, READ  BACK BY AND VERIFIED WITH ISAAC LANE RN 01/23/23 2113 [place] Performed at [facility] [address] [place], [place] [address]   I-Stat Chem 8, ED     Status: Abnormal   Collection Time: 01/23/23  8:01 PM  Result Value Ref Range   Sodium 134 (L) 135 - 145 mmol/L   Potassium 4.9 3.5 - 5.1 mmol/L   Chloride 104 98 - 111 mmol/L   BUN 92 (H) 8 - 23 mg/dL   Creatinine, Ser 12.40 (H) 0.61 - 1.24 mg/dL   Glucose, Bld 104 (H) 70 - 99 mg/dL    Comment: Glucose reference range applies only to samples taken after fasting for at least 8 hours.   Calcium, Ion 1.11 (L) 1.15 - 1.40 mmol/L   TCO2 17 (L) 22 - 32 mmol/L   Hemoglobin 9.2 (L) 13.0 - 17.0 g/dL   HCT 27.0 (L) 39.0 - 52.0 %  Ammonia     Status: None    Collection Time: 01/23/23 11:45 PM  Result Value Ref Range   Ammonia 28 9 - 35 umol/L    Comment: HEMOLYSIS AT THIS LEVEL MAY AFFECT RESULT Performed at [facility], [place] [address] [place], [place] [address]   CK     Status: Abnormal   Collection Time: 01/23/23 11:45 PM  Result Value Ref Range   Total CK 575 (H) 49 - 397 U/L    Comment: Performed at [facility], [place] [address], [place], [place] [address]  Osmolality     Status: Abnormal   Collection Time: 01/23/23 11:45 PM  Result Value Ref Range   Osmolality 313 (H) 275 - 295 mOsm/kg    Comment: REPEATED TO VERIFY Performed at [facility], [place] [place], [place] [address]   Vitamin B12     Status: None   Collection Time: 01/23/23 11:45 PM  Result Value Ref Range   Vitamin B-12 416 180 - 914 pg/mL    Comment: (NOTE) This assay is not validated for testing neonatal or myeloproliferative syndrome specimens for Vitamin B12 levels. Performed at [facility], [place] [address] [place] [address] [place], [place] [address]   Iron and TIBC     Status: Abnormal   Collection Time: 01/23/23 11:45 PM  Result Value Ref Range   Iron 25 (L) 45 - 182 ug/dL   TIBC 182 (L) 250 - 450 ug/dL   Saturation Ratios 14 (L) 17.9 - 39.5 %   UIBC 157 ug/dL    Comment: Performed at [facility] [address], [place], [place] [address]  Ferritin     Status: Abnormal   Collection Time: 01/23/23 11:45 PM  Result Value Ref Range   Ferritin 784 (H) 24 - 336 ng/mL    Comment: Performed at [facility], [place] [address], [place], [place] [address]  Reticulocytes     Status: Abnormal   Collection Time: 01/23/23 11:45 PM  Result Value Ref Range   Retic Ct Pct 2.5 0.4 - 3.1 %   RBC. 2.31 (L) 4.22 - 5.81 MIL/uL   Retic Count, Absolute 57.3 19.0 - 186.0 K/uL   Immature Retic Fract 9.9 2.3 - 15.9 %    Comment: Performed at [facility] [address] [place], [place] [address]  Folate     Status: None   Collection  Time: 01/23/23 11:45 PM  Result Value Ref Range   Folate 22.4 >5.9 ng/mL    Comment:  RESULT CONFIRMED BY MANUAL DILUTION Performed at [facility], [place] [address], [place], [place] [address]   TSH     Status: Abnormal   Collection Time: 01/23/23 11:45 PM  Result Value Ref Range   TSH 4.894 (H) 0.350 - 4.500 uIU/mL    Comment: Performed by a 3rd Generation assay with a functional sensitivity of <=0.01 uIU/mL. Performed at [facility], [place] [address], [place], [place] [address]   CBC with Differential     Status: Abnormal   Collection Time: 01/23/23 11:45 PM  Result Value Ref Range   WBC 17.6 (H) 4.0 - 10.5 K/uL   RBC 2.26 (L) 4.22 - 5.81 MIL/uL   Hemoglobin 7.6 (L) 13.0 - 17.0 g/dL   HCT 22.0 (L) 39.0 - 52.0 %   MCV 97.3 80.0 - 100.0 fL   MCH 33.6 26.0 - 34.0 pg   MCHC 34.5 30.0 - 36.0 g/dL   RDW 13.6 11.5 - 15.5 %   Platelets 84 (L) 150 - 400 K/uL    Comment: Immature Platelet Fraction may be clinically indicated, consider ordering this additional test [id number] REPEATED TO VERIFY    nRBC 0.0 0.0 - 0.2 %   Neutrophils Relative % 86 %   Neutro Abs 15.3 (H) 1.7 - 7.7 K/uL   Lymphocytes Relative 4 %   Lymphs Abs 0.6 (L) 0.7 - 4.0 K/uL   Monocytes Relative 7 %   Monocytes Absolute 1.2 (H) 0.1 - 1.0 K/uL   Eosinophils Relative 0 %   Eosinophils Absolute 0.0 0.0 - 0.5 K/uL   Basophils Relative 1 %   Basophils Absolute 0.1 0.0 - 0.1 K/uL   Immature Granulocytes 2 %   Abs Immature Granulocytes 0.35 (H) 0.00 - 0.07 K/uL    Comment: Performed at [facility] [address], [place], [place] [address]  Procalcitonin     Status: None   Collection Time: 01/23/23 11:45 PM  Result Value Ref Range   Procalcitonin 33.97 ng/mL    Comment:        Interpretation: PCT >= 10 ng/mL: Important systemic inflammatory response, almost exclusively due to severe bacterial sepsis or septic shock. (NOTE)       Sepsis PCT Algorithm           Lower Respiratory Tract                                       Infection PCT Algorithm    ----------------------------     ----------------------------         PCT < 0.25 ng/mL                PCT < 0.10 ng/mL          Strongly encourage             Strongly discourage   discontinuation of antibiotics    initiation of antibiotics    ----------------------------     -----------------------------       PCT 0.25 - 0.50 ng/mL            PCT 0.10 - 0.25 ng/mL               OR       >80% decrease in PCT            Discourage initiation of  antibiotics      Encourage discontinuation           of antibiotics    ----------------------------     -----------------------------         PCT >= 0.50 ng/mL              PCT 0.26 - 0.50 ng/mL                AND       <80% decrease in PCT             Encourage initiation of                                             antibiotics       Encourage continuation           of antibiotics    ----------------------------     -----------------------------        PCT >= 0.50 ng/mL                  PCT > 0.50 ng/mL               AND         increase in PCT                  Strongly encourage                                      initiation of antibiotics    Strongly encourage escalation           of antibiotics                                     -----------------------------                                           PCT <= 0.25 ng/mL                                                 OR                                        > 80% decrease in PCT                                      Discontinue / Do not initiate                                             antibiotics  Performed at [facility], [place] [address], [place], [place] [address]   I-Stat venous blood gas, ED     Status: Abnormal  Collection Time: 01/23/23 11:58 PM  Result Value Ref Range   pH, Ven 7.403 7.25 - 7.43   pCO2, Ven 33.6 (L) 44 - 60 mmHg   pO2, Ven 145 (H) 32 - 45 mmHg    Bicarbonate 21.0 20.0 - 28.0 mmol/L   TCO2 22 22 - 32 mmol/L   O2 Saturation 99 %   Acid-base deficit 3.0 (H) 0.0 - 2.0 mmol/L   Sodium 136 135 - 145 mmol/L   Potassium 5.6 (H) 3.5 - 5.1 mmol/L   Calcium, Ion 1.13 (L) 1.15 - 1.40 mmol/L   HCT 22.0 (L) 39.0 - 52.0 %   Hemoglobin 7.5 (L) 13.0 - 17.0 g/dL   Sample type VENOUS   Lactic acid, plasma     Status: None   Collection Time: 01/24/23  4:19 AM  Result Value Ref Range   Lactic Acid, Venous 1.6 0.5 - 1.9 mmol/L    Comment: Performed at [facility], [place] [address], [place], [place] [address]  CK     Status: Abnormal   Collection Time: 01/24/23  4:19 AM  Result Value Ref Range   Total CK 459 (H) 49 - 397 U/L    Comment: Performed at [facility], [place] [address] [place], [place] [address]   VAS [place] LOWER EXTREMITY VENOUS (DVT) (7a-7p)  Result Date: 01/24/2023  Lower Venous DVT Study Patient Name:  FARZAD TIBBETTS  Date of Exam:   01/23/2023 Medical Rec #: [id number]             Accession #:    [id number] Date of Birth: 26-Apr-1956             Patient Gender: M Patient Age:   67 years Exam Location:  [facility] Procedure:      VAS [place] LOWER EXTREMITY VENOUS (DVT) Referring Phys: Garnette Gunner --------------------------------------------------------------------------------  Indications: Edema.  Risk Factors: Surgery. Limitations: Poor ultrasound/tissue interface. Comparison Study: No prior studies. Performing Technologist: Oliver Hum RVT  Examination Guidelines: A complete evaluation includes B-mode imaging, spectral Doppler, color Doppler, and power Doppler as needed of all accessible portions of each vessel. Bilateral testing is considered an integral part of a complete examination. Limited examinations for reoccurring indications may be performed as noted. The reflux portion of the exam is performed with the patient in reverse Trendelenburg.  +-----+---------------+---------+-----------+----------+--------------+  RIGHTCompressibilityPhasicitySpontaneityPropertiesThrombus Aging +-----+---------------+---------+-----------+----------+--------------+ CFV  Full           Yes      Yes                                 +-----+---------------+---------+-----------+----------+--------------+   +---------+---------------+---------+-----------+----------+--------------+ LEFT     CompressibilityPhasicitySpontaneityPropertiesThrombus Aging +---------+---------------+---------+-----------+----------+--------------+ CFV      Full           Yes      Yes                                 +---------+---------------+---------+-----------+----------+--------------+ SFJ      Full                                                        +---------+---------------+---------+-----------+----------+--------------+ FV Prox  Full                                                        +---------+---------------+---------+-----------+----------+--------------+  FV Mid   Full           Yes      Yes                                 +---------+---------------+---------+-----------+----------+--------------+ FV DistalFull                                                        +---------+---------------+---------+-----------+----------+--------------+ PFV      Full                                                        +---------+---------------+---------+-----------+----------+--------------+ POP      Full           Yes      Yes                                 +---------+---------------+---------+-----------+----------+--------------+ PTV      Full                                                        +---------+---------------+---------+-----------+----------+--------------+ PERO     Full                                                        +---------+---------------+---------+-----------+----------+--------------+     Summary: RIGHT: - No evidence of common femoral vein  obstruction.  LEFT: - There is no evidence of deep vein thrombosis in the lower extremity.  - No cystic structure found in the popliteal fossa.  *See table(s) above for measurements and observations. Electronically signed by Jamelle Haring on 01/24/2023 at 7:31:58 AM.    Final    MR LUMBAR SPINE WO CONTRAST  Result Date: 01/23/2023 CLINICAL DATA:  Back pain EXAM: MRI CERVICAL, THORACIC AND LUMBAR SPINE WITHOUT CONTRAST TECHNIQUE: Multiplanar and multiecho pulse sequences of the cervical spine, to include the craniocervical junction and cervicothoracic junction, and thoracic and lumbar spine, were obtained without intravenous contrast. COMPARISON:  09/18/2022 FINDINGS: MRI CERVICAL SPINE FINDINGS Alignment: Physiologic. Vertebrae: C3-6 ACDF.  No acute abnormality. Cord: Myelomalacia at the C3-4 level, unchanged. Posterior Fossa, vertebral arteries, paraspinal tissues: Negative. Disc levels: C1-2: Unremarkable. C2-3: Mild facet hypertrophy with small disc bulge and uncinate spurring. There is no spinal canal stenosis. Bilateral neural foraminal stenosis. C3-4: ACDF. Unchanged small disc bulge. Unchanged severe spinal canal stenosis. No neural foraminal stenosis. C4-5: ACDF. Mild facet hypertrophy. There is no spinal canal stenosis. No neural foraminal stenosis. C5-6: ACDF with small disc bulge and uncinate spurring. Unchanged mild spinal canal stenosis. Unchanged moderate bilateral neural foraminal stenosis. C6-7: Small disc bulge. There is no spinal canal stenosis. No neural foraminal stenosis. C7-T1: Normal disc space and facet  joints. There is no spinal canal stenosis. No neural foraminal stenosis. MRI THORACIC SPINE FINDINGS Alignment:  Physiologic. Vertebrae: No fracture, evidence of discitis, or bone lesion. Modic type 2 endplate signal changes at L4-5, unchanged. Cord:  Normal signal and morphology. Paraspinal and other soft tissues: Negative. Disc levels: There is mild multilevel degenerative disc disease,  greatest at T9-10 where there is mild spinal canal stenosis. No stenosis at any other level. MRI LUMBAR SPINE FINDINGS Segmentation:  Standard. Alignment:  Physiologic. Vertebrae:  No fracture, evidence of discitis, or bone lesion. Conus medullaris and cauda equina: Conus extends to the T12 level. Conus and cauda equina appear normal. Paraspinal and other soft tissues: Negative Disc levels: L1-L2: Normal disc space and facet joints. No spinal canal stenosis. No neural foraminal stenosis. L2-L3: Small disc bulge, unchanged. Unchanged mild spinal canal stenosis. No neural foraminal stenosis. L3-L4: Unchanged small disc bulge. Mild spinal canal stenosis. No neural foraminal stenosis. L4-L5: Unchanged intermediate sized disc bulge, right asymmetric. No spinal canal stenosis. Unchanged mild right neural foraminal stenosis. L5-S1: Normal disc space and facet joints. No spinal canal stenosis. No neural foraminal stenosis. Visualized sacrum: Normal. IMPRESSION: 1. C3-6 ACDF with unchanged severe spinal canal stenosis at C3-4 and focal myelomalacia. 2. Unchanged moderate bilateral C5-6 neural foraminal stenosis. 3. Mild multilevel thoracic degenerative disc disease with mild spinal canal stenosis at T9-10. 4. Unchanged mild spinal canal stenosis at L2-3, L3-4 and L4-5. 5. Unchanged mild right L4-5 neural foraminal stenosis. Electronically Signed   By: Ulyses Jarred M.D.   On: 01/23/2023 22:33   MR THORACIC SPINE WO CONTRAST  Result Date: 01/23/2023 CLINICAL DATA:  Back pain EXAM: MRI CERVICAL, THORACIC AND LUMBAR SPINE WITHOUT CONTRAST TECHNIQUE: Multiplanar and multiecho pulse sequences of the cervical spine, to include the craniocervical junction and cervicothoracic junction, and thoracic and lumbar spine, were obtained without intravenous contrast. COMPARISON:  09/18/2022 FINDINGS: MRI CERVICAL SPINE FINDINGS Alignment: Physiologic. Vertebrae: C3-6 ACDF.  No acute abnormality. Cord: Myelomalacia at the C3-4 level,  unchanged. Posterior Fossa, vertebral arteries, paraspinal tissues: Negative. Disc levels: C1-2: Unremarkable. C2-3: Mild facet hypertrophy with small disc bulge and uncinate spurring. There is no spinal canal stenosis. Bilateral neural foraminal stenosis. C3-4: ACDF. Unchanged small disc bulge. Unchanged severe spinal canal stenosis. No neural foraminal stenosis. C4-5: ACDF. Mild facet hypertrophy. There is no spinal canal stenosis. No neural foraminal stenosis. C5-6: ACDF with small disc bulge and uncinate spurring. Unchanged mild spinal canal stenosis. Unchanged moderate bilateral neural foraminal stenosis. C6-7: Small disc bulge. There is no spinal canal stenosis. No neural foraminal stenosis. C7-T1: Normal disc space and facet joints. There is no spinal canal stenosis. No neural foraminal stenosis. MRI THORACIC SPINE FINDINGS Alignment:  Physiologic. Vertebrae: No fracture, evidence of discitis, or bone lesion. Modic type 2 endplate signal changes at L4-5, unchanged. Cord:  Normal signal and morphology. Paraspinal and other soft tissues: Negative. Disc levels: There is mild multilevel degenerative disc disease, greatest at T9-10 where there is mild spinal canal stenosis. No stenosis at any other level. MRI LUMBAR SPINE FINDINGS Segmentation:  Standard. Alignment:  Physiologic. Vertebrae:  No fracture, evidence of discitis, or bone lesion. Conus medullaris and cauda equina: Conus extends to the T12 level. Conus and cauda equina appear normal. Paraspinal and other soft tissues: Negative Disc levels: L1-L2: Normal disc space and facet joints. No spinal canal stenosis. No neural foraminal stenosis. L2-L3: Small disc bulge, unchanged. Unchanged mild spinal canal stenosis. No neural foraminal stenosis. L3-L4: Unchanged small disc bulge. Mild  spinal canal stenosis. No neural foraminal stenosis. L4-L5: Unchanged intermediate sized disc bulge, right asymmetric. No spinal canal stenosis. Unchanged mild right neural  foraminal stenosis. L5-S1: Normal disc space and facet joints. No spinal canal stenosis. No neural foraminal stenosis. Visualized sacrum: Normal. IMPRESSION: 1. C3-6 ACDF with unchanged severe spinal canal stenosis at C3-4 and focal myelomalacia. 2. Unchanged moderate bilateral C5-6 neural foraminal stenosis. 3. Mild multilevel thoracic degenerative disc disease with mild spinal canal stenosis at T9-10. 4. Unchanged mild spinal canal stenosis at L2-3, L3-4 and L4-5. 5. Unchanged mild right L4-5 neural foraminal stenosis. Electronically Signed   By: Ulyses Jarred M.D.   On: 01/23/2023 22:33   MR Cervical Spine Wo Contrast  Result Date: 01/23/2023 CLINICAL DATA:  Back pain EXAM: MRI CERVICAL, THORACIC AND LUMBAR SPINE WITHOUT CONTRAST TECHNIQUE: Multiplanar and multiecho pulse sequences of the cervical spine, to include the craniocervical junction and cervicothoracic junction, and thoracic and lumbar spine, were obtained without intravenous contrast. COMPARISON:  09/18/2022 FINDINGS: MRI CERVICAL SPINE FINDINGS Alignment: Physiologic. Vertebrae: C3-6 ACDF.  No acute abnormality. Cord: Myelomalacia at the C3-4 level, unchanged. Posterior Fossa, vertebral arteries, paraspinal tissues: Negative. Disc levels: C1-2: Unremarkable. C2-3: Mild facet hypertrophy with small disc bulge and uncinate spurring. There is no spinal canal stenosis. Bilateral neural foraminal stenosis. C3-4: ACDF. Unchanged small disc bulge. Unchanged severe spinal canal stenosis. No neural foraminal stenosis. C4-5: ACDF. Mild facet hypertrophy. There is no spinal canal stenosis. No neural foraminal stenosis. C5-6: ACDF with small disc bulge and uncinate spurring. Unchanged mild spinal canal stenosis. Unchanged moderate bilateral neural foraminal stenosis. C6-7: Small disc bulge. There is no spinal canal stenosis. No neural foraminal stenosis. C7-T1: Normal disc space and facet joints. There is no spinal canal stenosis. No neural foraminal stenosis.  MRI THORACIC SPINE FINDINGS Alignment:  Physiologic. Vertebrae: No fracture, evidence of discitis, or bone lesion. Modic type 2 endplate signal changes at L4-5, unchanged. Cord:  Normal signal and morphology. Paraspinal and other soft tissues: Negative. Disc levels: There is mild multilevel degenerative disc disease, greatest at T9-10 where there is mild spinal canal stenosis. No stenosis at any other level. MRI LUMBAR SPINE FINDINGS Segmentation:  Standard. Alignment:  Physiologic. Vertebrae:  No fracture, evidence of discitis, or bone lesion. Conus medullaris and cauda equina: Conus extends to the T12 level. Conus and cauda equina appear normal. Paraspinal and other soft tissues: Negative Disc levels: L1-L2: Normal disc space and facet joints. No spinal canal stenosis. No neural foraminal stenosis. L2-L3: Small disc bulge, unchanged. Unchanged mild spinal canal stenosis. No neural foraminal stenosis. L3-L4: Unchanged small disc bulge. Mild spinal canal stenosis. No neural foraminal stenosis. L4-L5: Unchanged intermediate sized disc bulge, right asymmetric. No spinal canal stenosis. Unchanged mild right neural foraminal stenosis. L5-S1: Normal disc space and facet joints. No spinal canal stenosis. No neural foraminal stenosis. Visualized sacrum: Normal. IMPRESSION: 1. C3-6 ACDF with unchanged severe spinal canal stenosis at C3-4 and focal myelomalacia. 2. Unchanged moderate bilateral C5-6 neural foraminal stenosis. 3. Mild multilevel thoracic degenerative disc disease with mild spinal canal stenosis at T9-10. 4. Unchanged mild spinal canal stenosis at L2-3, L3-4 and L4-5. 5. Unchanged mild right L4-5 neural foraminal stenosis. Electronically Signed   By: Ulyses Jarred M.D.   On: 01/23/2023 22:33   CT ABDOMEN PELVIS WO CONTRAST  Result Date: 01/23/2023 CLINICAL DATA:  Kidney failure, acute renal failure EXAM: CT ABDOMEN AND PELVIS WITHOUT CONTRAST TECHNIQUE: Multidetector CT imaging of the abdomen and pelvis was  performed following the  standard protocol without IV contrast. RADIATION DOSE REDUCTION: This exam was performed according to the departmental dose-optimization program which includes automated exposure control, adjustment of the mA and/or kV according to patient size and/or use of iterative reconstruction technique. COMPARISON:  CT 03/26/2018. FINDINGS: Lower chest: No acute abnormality. Hepatobiliary: No focal liver abnormality is seen. Decompressed gallbladder with cholelithiasis. Pancreas: Unremarkable. No pancreatic ductal dilatation or surrounding inflammatory changes. Spleen: Normal in size without focal abnormality. Adrenals/Urinary Tract: Adrenal glands are unremarkable. Moderate bilateral hydronephrosis. No nephroureterolithiasis. Mild perinephric stranding bilaterally. Markedly dilated urinary bladder. Stomach/Bowel: The stomach is within normal limits. There is no evidence of bowel obstruction.The appendix is normal. Vascular/Lymphatic: Aortoiliac atherosclerosis. No AAA. No lymphadenopathy. Retroaortic left renal vein. Reproductive: Enlarged prostate gland. Bilateral scrotal varicoceles. Other: Small fat containing umbilical hernia. No abdominopelvic ascites. Musculoskeletal: No acute osseous abnormality. No suspicious osseous lesion. Prior left hip arthroplasty. Normal alignment. Moderate right hip osteoarthritis. Degenerative changes of the spine most prominent at L4-L5. IMPRESSION: Findings suggestive of bladder outlet obstruction due to an enlarged prostate gland. Markedly distended urinary bladder and moderate bilateral hydroureteronephrosis. No other acute findings in the abdomen or pelvis. Additional chronic findings as described above. Electronically Signed   By: Maurine Simmering M.D.   On: 01/23/2023 18:46   DG Hip Unilat With Pelvis 2-3 Views Left  Result Date: 01/23/2023 CLINICAL DATA:  Sepsis, surgery EXAM: DG HIP (WITH OR WITHOUT PELVIS) 3V LEFT COMPARISON:  08/14/2021. FINDINGS: Status post  left total hip arthroplasty. No periprosthetic lucency to suggest loosening. No acute fracture, dislocation or subluxation. Pelvic ring intact. IMPRESSION: Unremarkable appearance status post left total hip arthroplasty. Electronically Signed   By: Sammie Bench M.D.   On: 01/23/2023 17:29   DG Chest 1 View  Result Date: 01/23/2023 CLINICAL DATA:  Recent surgery, sepsis EXAM: CHEST  1 VIEW COMPARISON:  None Available. FINDINGS: The heart is enlarged. The lungs are clear. There is no pleural effusion or pneumothorax. No acute fractures are seen. IMPRESSION: Cardiomegaly. No acute pulmonary process. Electronically Signed   By: Ronney Asters M.D.   On: 01/23/2023 17:29    PMH:   Past Medical History:  Diagnosis Date   Arthritis    BPH (benign prostatic hyperplasia)    Colon polyp    ED (erectile dysfunction)    Elevated liver enzymes    Gallstones    Hepatitis    History of anemia    Hypercholesterolemia    Hypothyroidism    Mild tricuspid regurgitation    Mitral valve regurgitation    Thyroid disease    Vertigo     PSH:   Past Surgical History:  Procedure Laterality Date   ANTERIOR CERVICAL DECOMP/DISCECTOMY FUSION N/A 07/23/2022   Procedure: Anterior Cervical Discectomy and Fusion Cervical Three-Four/Four-Five/Five-Six;  Surgeon: Vallarie Mare, MD;  Location: [place];  Service: Neurosurgery;  Laterality: N/A;  3C   FLEXIBLE SIGMOIDOSCOPY     TOTAL HIP ARTHROPLASTY Left 01/06/2023   Procedure: LEFT TOTAL HIP ARTHROPLASTY ANTERIOR APPROACH;  Surgeon: Frederik Pear, MD;  Location: [facility];  Service: Orthopedics;  Laterality: Left;    Allergies:  Allergies  Allergen Reactions   Tadalafil Other (See Comments)    Headache     Medications:   Prior to Admission medications   Medication Sig Start Date End Date Taking? Authorizing Provider  aspirin EC 81 MG tablet Take 1 tablet (81 mg total) by mouth 2 (two) times daily. 01/06/23  Yes Leighton Parody, PA-C  azaTHIOprine [employer])  50 MG tablet Take 75 mg by mouth daily. 05/28/21  Yes [provider]  levothyroxine (SYNTHROID) 200 MCG tablet Take 200 mcg by mouth daily before breakfast.   Yes [provider]  Multiple Vitamins-Minerals (CENTRUM ADULT PO) Take 1 tablet by mouth daily.   Yes [provider]  oxyCODONE-acetaminophen (PERCOCET/ROXICET) 5-325 MG tablet Take 1 tablet by mouth every 4 (four) hours as needed for severe pain. 01/06/23  Yes Joanell Rising K, PA-C  tiZANidine (ZANAFLEX) 2 MG tablet Take 1 tablet (2 mg total) by mouth every 6 (six) hours as needed for muscle spasms. 01/06/23  Yes Leighton Parody, PA-C    Discontinued Meds:   Medications Discontinued During This Encounter  Medication Reason   vancomycin variable dose per unstable renal function (pharmacist dosing)    cefTRIAXone (ROCEPHIN) 2 g in sodium chloride 0.9 % 597 mL IVPB Duplicate    Social History:  reports that he has quit smoking. He has never used smokeless tobacco. He reports current alcohol use. He reports that he does not use drugs.  Family History:   Family History  Problem Relation Age of Onset   Cancer Mother        pancreatic   Cancer Father        stomach   Kidney failure Brother    Other Brother        neck tumor   Hypertension Brother    Diabetes Brother    Lupus Daughter    Hypertension Sister    Other Son        boating accident    Blood pressure (!) 140/93, pulse 78, temperature (!) 97.5 F (36.4 C), temperature source Oral, resp. rate 16, height '6\' 3"'$  (1.905 m), weight 108 kg, SpO2 100 %. General appearance: alert, cooperative, and no distress Resp: clear to auscultation bilaterally Cardio: regular rate and rhythm, S1, S2 normal, no murmur, click, rub or gallop GI: soft, non-tender; bowel sounds normal; no masses,  no organomegaly and red/amber-colored urine in foley bag Extremities: edema 1+ BLEs Neurologic: Mental status: Alert, oriented, thought content appropriate  Wells Guiles, DO 01/24/2023, 11:02 AM

## 2023-01-24 NOTE — Progress Notes (Signed)
Initial Nutrition Assessment  DOCUMENTATION CODES:   Not applicable  INTERVENTION:   -Liberalize diet to regular for widest variety of meal selections -MVI with minerals daily -Ensure Enlive po BID, each supplement provides 350 kcal and 20 grams of protein.   NUTRITION DIAGNOSIS:   Increased nutrient needs related to acute illness as evidenced by estimated needs.  GOAL:   Patient will meet greater than or equal to 90% of their needs  MONITOR:   PO intake, Supplement acceptance  REASON FOR ASSESSMENT:   Consult Assessment of nutrition requirement/status  ASSESSMENT:   Pt with medical history significant of autoimmune hepatitis, BPH, Anemia, hypothyroidism admitted with AKI, sepsis, and lower urinary tract infectious disease.  Pt admitted with severe sepsis secondary to UTI.   Reviewed I/O's: +4 L x 24 hours  Per nephrology notes, no plan for RRT at this time. MD hopeful that renal function will improve with relief of outflow obstruction.   Spoke with pt over the phone, who states he is feeling much better today. Pt shares that he has was feeling poorly for 3-4 days PTA (since 01/19/23) and had a steadily decreased appetite and oral intake during that time. Pt's intake has picked up since admission. Per pt, he consumed 100% of broccoli soup and chicken sandwich from [place]. Pt voiced displeasure of lack of meal options on heart healthy, carb modified diet and does not understand why he is on a restricted diet.  Prior to acute illness, pt reports good appetite, generally consuming 2-3 meals per day (Breakfast: boiled egg, toast, yogurt, and bagel, Lunch: sandwich, Dinner: meat, starch, and vegetable). Pt reports no changes in mobility. He had a recent hip surgery and was making great progress with therapy at home.   Pt denies any weight loss. He states his UBW is around 240#. Reviewed wt hx; wt has been stable over the past 3 months.   Discussed importance of good meal and  supplement intake to promote healing. Pt amenable to supplements, states he starting drinking protein shakes from [facility] PTA. Given pt's medical history and prior oral intake, will liberalize diet for wider variety of meal selections.   Rehab team recommending home health services at discharge.  Medications reviewed and include lactated ringers infusion @ 25 ml/hr.    Labs reviewed: K: 5.6.    Diet Order:   Diet Order             Diet regular Room service appropriate? Yes; Fluid consistency: Thin  Diet effective now                   EDUCATION NEEDS:   Education needs have been addressed  Skin:  Skin Assessment: Reviewed RN Assessment  Last BM:  01/24/23  Height:   Ht Readings from Last 1 Encounters:  01/23/23 '6\' 3"'$  (1.905 m)    Weight:   Wt Readings from Last 1 Encounters:  01/23/23 108 kg    Ideal Body Weight:  86.4 kg  BMI:  Body mass index is 29.76 kg/m.  Estimated Nutritional Needs:   Kcal:  7096-2836  Protein:  115-130 grams  Fluid:  > 2 L    Loistine Chance, RD, LDN, [place] Registered Dietitian II Certified Diabetes Care and Education Specialist Please refer to [facility] for RD and/or RD on-call/weekend/after hours pager

## 2023-01-24 NOTE — Assessment & Plan Note (Signed)
-  SIRS criteria met with  elevated white blood cell count,       Component Value Date/Time   WBC 14.2 (H) 01/23/2023 1547   LYMPHSABS 0.5 (L) 01/23/2023 1547    Today's Vitals   01/23/23 2230 01/23/23 2245 01/23/23 2315 01/23/23 2333  BP: (!) 87/62 (!) 83/47 (!) 82/58   Pulse: 100 95 93   Resp: (!) 24 (!) 25 (!) 23   Temp:      TempSrc:      SpO2: 99% 100% 98%   Weight:    108 kg  Height:    '6\' 3"'$  (1.905 m)  PainSc:         The recent clinical data is shown below. Vitals:   01/23/23 2230 01/23/23 2245 01/23/23 2315 01/23/23 2333  BP: (!) 87/62 (!) 83/47 (!) 82/58   Pulse: 100 95 93   Resp: (!) 24 (!) 25 (!) 23   Temp:      TempSrc:      SpO2: 99% 100% 98%   Weight:    108 kg  Height:    '6\' 3"'$  (1.905 m)    -Most likely source being:  urinary,    Patient meeting criteria for Severe sepsis with    evidence of end organ damage/organ dysfunction such as   Acute Kidney Injury with Cr > 2,  Lab Results  Component Value Date   CREATININE 12.40 (H) 01/23/2023   CREATININE 14.05 (H) 01/23/2023    elevated lactic acid >2     Component Value Date/Time   LATICACIDVEN 2.1 (HH) 01/23/2023 1751    acute metabolic encephalopathy    - Obtain serial lactic acid and procalcitonin level.  - Initiated IV antibiotics in ER: Antibiotics Given (last 72 hours)     Date/Time Action Medication Dose Rate   01/23/23 1753 New Bag/Given   cefTRIAXone (ROCEPHIN) 2 g in sodium chloride 0.9 % 100 mL IVPB 2 g 200 mL/hr   01/23/23 1837 New Bag/Given  [Awaiting from pharmacy]   vancomycin (VANCOREADY) IVPB 2000 mg/400 mL 2,000 mg 200 mL/hr       Will continue  on : rocephin   - await results of blood and urine culture  - Rehydrate aggressively  Intravenous fluids were administered    1:25 AM

## 2023-01-24 NOTE — Assessment & Plan Note (Signed)
Will need pt ot eval

## 2023-01-24 NOTE — Progress Notes (Signed)
Pt arrived from ..ED..., A/ox .4..pt denies any pain, MD aware,CCMD called. CHG bath given,no further needs at this time   

## 2023-01-24 NOTE — Assessment & Plan Note (Signed)
-   Order Sensitive    - Hold by mouth medications

## 2023-01-24 NOTE — Assessment & Plan Note (Signed)
-   treat with Rocephin         await results of urine culture and adjust antibiotic coverage as needed  

## 2023-01-24 NOTE — Assessment & Plan Note (Signed)
-    evidence of acute renal failure due to presence of following: Cr increased >0.3 from baseline   likely secondary to   urinary retention        check FeNA       Rehydrate with IV fluids     renal consult.

## 2023-01-24 NOTE — Assessment & Plan Note (Signed)
On chronic imuuran

## 2023-01-24 NOTE — Assessment & Plan Note (Signed)
Chronic. 

## 2023-01-24 NOTE — ED Notes (Signed)
ED TO INPATIENT HANDOFF REPORT  ED Nurse Name and Phone #: Luetta Nutting [phone]  S Name/Age/Gender Alex West 67 y.o. male Room/Bed: 010C/010C  Code Status   Code Status: Full Code  Home/SNF/Other Home Patient oriented to: self, place, time, and situation Is this baseline? Yes   Triage Complete: Triage complete  Chief Complaint AKI (acute kidney injury) ([place]) [N17.9]  Triage Note Pt BIB [facility] from home due to increased weakness since last night.  Pt had left hip replacement 01/06/23.  Pt unable to stand and bear any weight.  Pt denies any chest pain, n/v, dizziness or SHOB.   VS BP 113/63, HR 94, SpO2 94%, CBG 154.  Temp of 104.0.  [facility] gave '600mg'$  of Tylenol en route.   Allergies Allergies  Allergen Reactions   Tadalafil Other (See Comments)    Headache     Level of Care/Admitting Diagnosis ED Disposition     ED Disposition  Admit   Condition  --   [place]: [facility] [[id number]]  Level of Care: Progressive [102]  Admit to Progressive based on following criteria: MULTISYSTEM THREATS such as stable sepsis, metabolic/electrolyte imbalance with or without encephalopathy that is responding to early treatment.  May admit patient to [facility] or [facility] if equivalent level of care is available:: No  Covid Evaluation: Asymptomatic - no recent exposure (last 10 days) testing not required  Diagnosis: AKI (acute kidney injury) [facility]) [[id number]]  Admitting Physician: Toy Baker [[id number]]  Attending Physician: Toy Baker [[id number]]  Certification:: I certify this patient will need inpatient services for at least 2 midnights  Estimated Length of Stay: 2          B Medical/Surgery History Past Medical History:  Diagnosis Date   Arthritis    BPH (benign prostatic hyperplasia)    Colon polyp    ED (erectile dysfunction)    Elevated liver enzymes    Gallstones    Hepatitis    History of anemia    Hypercholesterolemia     Hypothyroidism    Mild tricuspid regurgitation    Mitral valve regurgitation    Thyroid disease    Vertigo    Past Surgical History:  Procedure Laterality Date   ANTERIOR CERVICAL DECOMP/DISCECTOMY FUSION N/A 07/23/2022   Procedure: Anterior Cervical Discectomy and Fusion Cervical Three-Four/Four-Five/Five-Six;  Surgeon: Vallarie Mare, MD;  Location: [facility];  Service: Neurosurgery;  Laterality: N/A;  3C   FLEXIBLE SIGMOIDOSCOPY     TOTAL HIP ARTHROPLASTY Left 01/06/2023   Procedure: LEFT TOTAL HIP ARTHROPLASTY ANTERIOR APPROACH;  Surgeon: Frederik Pear, MD;  Location: [facility];  Service: Orthopedics;  Laterality: Left;     A IV Location/Drains/Wounds Patient Lines/Drains/Airways Status     Active Line/Drains/Airways     Name Placement date Placement time Site Days   Peripheral IV 01/23/23 22 G Anterior;Right Forearm 01/23/23  2320  Forearm  1   Peripheral IV 01/23/23 22 G Anterior;Left Forearm 01/23/23  2320  Forearm  1   Urethral Catheter Chaney Born RN Straight-tip 16 Fr. 01/23/23  1844  Straight-tip  1            Intake/Output Last 24 hours  Intake/Output Summary (Last 24 hours) at 01/24/2023 1039 Last data filed at 01/24/2023 0240 Gross per 24 hour  Intake 4020 ml  Output --  Net 4020 ml    Labs/Imaging Results for orders placed or performed during the hospital encounter of 01/23/23 (from the past 48 hour(s))  Resp  panel by RT-PCR (RSV, Flu A&B, Covid) Anterior Nasal Swab     Status: None   Collection Time: 01/23/23  3:18 PM   Specimen: Anterior Nasal Swab  Result Value Ref Range   SARS Coronavirus 2 by RT PCR NEGATIVE NEGATIVE   Influenza A by PCR NEGATIVE NEGATIVE   Influenza B by PCR NEGATIVE NEGATIVE    Comment: (NOTE) The Xpert Xpress SARS-CoV-2/FLU/RSV plus assay is intended as an aid in the diagnosis of influenza from Nasopharyngeal swab specimens and should not be used as a sole basis for treatment. Nasal washings and aspirates are unacceptable for Xpert  Xpress SARS-CoV-2/FLU/RSV testing.  Fact Sheet for Patients: EntrepreneurPulse.com.au  Fact Sheet for Healthcare Providers: IncredibleEmployment.be  This test is not yet approved or cleared by the [place] FDA and has been authorized for detection and/or diagnosis of SARS-CoV-2 by FDA under an Emergency Use Authorization (EUA). This EUA will remain in effect (meaning this test can be used) for the duration of the COVID-19 declaration under Section 564(b)(1) of the Act, 21 U.S.C. section 360bbb-3(b)(1), unless the authorization is terminated or revoked.     Resp Syncytial Virus by PCR NEGATIVE NEGATIVE    Comment: (NOTE) Fact Sheet for Patients: EntrepreneurPulse.com.au  Fact Sheet for Healthcare Providers: IncredibleEmployment.be  This test is not yet approved or cleared by the [place] FDA and has been authorized for detection and/or diagnosis of SARS-CoV-2 by FDA under an Emergency Use Authorization (EUA). This EUA will remain in effect (meaning this test can be used) for the duration of the COVID-19 declaration under Section 564(b)(1) of the Act, 21 U.S.C. section 360bbb-3(b)(1), unless the authorization is terminated or revoked.  Performed at [facility], [place] [address], [place], [place] [address]   Lactic acid, plasma     Status: None   Collection Time: 01/23/23  3:43 PM  Result Value Ref Range   Lactic Acid, Venous 1.5 0.5 - 1.9 mmol/L    Comment: Performed at [facility] [address], [place], [place] [address]  Blood Culture (routine x 2)     Status: None (Preliminary result)   Collection Time: 01/23/23  3:43 PM   Specimen: BLOOD  Result Value Ref Range   Specimen Description BLOOD RIGHT ANTECUBITAL    Special Requests      BOTTLES DRAWN AEROBIC AND ANAEROBIC Blood Culture adequate volume   Culture  Setup Time      GRAM NEGATIVE RODS IN BOTH AEROBIC AND ANAEROBIC  BOTTLES Organism ID to follow CRITICAL RESULT CALLED TO, READ BACK BY AND VERIFIED WITH: T RUDISILL,PHARMD'@0537'$  01/24/23 [place] Performed at [facility], [place] [address], [place], [place] [address]    Culture GRAM NEGATIVE RODS    Report Status PENDING   Blood Culture ID Panel (Reflexed)     Status: Abnormal   Collection Time: 01/23/23  3:43 PM  Result Value Ref Range   Enterococcus faecalis NOT DETECTED NOT DETECTED   Enterococcus Faecium NOT DETECTED NOT DETECTED   Listeria monocytogenes NOT DETECTED NOT DETECTED   Staphylococcus species NOT DETECTED NOT DETECTED   Staphylococcus aureus (BCID) NOT DETECTED NOT DETECTED   Staphylococcus epidermidis NOT DETECTED NOT DETECTED   Staphylococcus lugdunensis NOT DETECTED NOT DETECTED   Streptococcus species NOT DETECTED NOT DETECTED   Streptococcus agalactiae NOT DETECTED NOT DETECTED   Streptococcus pneumoniae NOT DETECTED NOT DETECTED   Streptococcus pyogenes NOT DETECTED NOT DETECTED   A.calcoaceticus-baumannii NOT DETECTED NOT DETECTED   Bacteroides fragilis NOT  DETECTED NOT DETECTED   Enterobacterales DETECTED (A) NOT DETECTED    Comment: Enterobacterales represent a large order of gram negative bacteria, not a single organism. CRITICAL RESULT CALLED TO, READ BACK BY AND VERIFIED WITH: T RUDISILL,PHARMD'@0538'$  01/24/23 [place]    Enterobacter cloacae complex NOT DETECTED NOT DETECTED   Escherichia coli NOT DETECTED NOT DETECTED   Klebsiella aerogenes NOT DETECTED NOT DETECTED   Klebsiella oxytoca NOT DETECTED NOT DETECTED   Klebsiella pneumoniae DETECTED (A) NOT DETECTED    Comment: CRITICAL RESULT CALLED TO, READ BACK BY AND VERIFIED WITH: T RUDISILL,PHARMD'@0538'$  01/24/23 [place]    Proteus species NOT DETECTED NOT DETECTED   Salmonella species NOT DETECTED NOT DETECTED   Serratia marcescens NOT DETECTED NOT DETECTED   Haemophilus influenzae NOT DETECTED NOT DETECTED   Neisseria meningitidis NOT DETECTED NOT DETECTED   Pseudomonas  aeruginosa NOT DETECTED NOT DETECTED   Stenotrophomonas maltophilia NOT DETECTED NOT DETECTED   Candida albicans NOT DETECTED NOT DETECTED   Candida auris NOT DETECTED NOT DETECTED   Candida glabrata NOT DETECTED NOT DETECTED   Candida krusei NOT DETECTED NOT DETECTED   Candida parapsilosis NOT DETECTED NOT DETECTED   Candida tropicalis NOT DETECTED NOT DETECTED   Cryptococcus neoformans/gattii NOT DETECTED NOT DETECTED   CTX-M ESBL NOT DETECTED NOT DETECTED   Carbapenem resistance IMP NOT DETECTED NOT DETECTED   Carbapenem resistance KPC NOT DETECTED NOT DETECTED   Carbapenem resistance NDM NOT DETECTED NOT DETECTED   Carbapenem resist OXA 48 LIKE NOT DETECTED NOT DETECTED   Carbapenem resistance VIM NOT DETECTED NOT DETECTED    Comment: Performed at [facility] [address], [place], [place] [address]  Comprehensive metabolic panel     Status: Abnormal   Collection Time: 01/23/23  3:47 PM  Result Value Ref Range   Sodium 129 (L) 135 - 145 mmol/L   Potassium 5.1 3.5 - 5.1 mmol/L   Chloride 102 98 - 111 mmol/L   CO2 18 (L) 22 - 32 mmol/L   Glucose, Bld 117 (H) 70 - 99 mg/dL    Comment: Glucose reference range applies only to samples taken after fasting for at least 8 hours.   BUN 96 (H) 8 - 23 mg/dL   Creatinine, Ser 14.05 (H) 0.61 - 1.24 mg/dL   Calcium 8.2 (L) 8.9 - 10.3 mg/dL   Total Protein 6.2 (L) 6.5 - 8.1 g/dL   Albumin 2.9 (L) 3.5 - 5.0 g/dL   AST 25 15 - 41 U/L   ALT 12 0 - 44 U/L   Alkaline Phosphatase 45 38 - 126 U/L   Total Bilirubin 0.8 0.3 - 1.2 mg/dL   GFR, Estimated 4 (L) >60 mL/min    Comment: (NOTE) Calculated using the CKD-EPI Creatinine Equation (2021)    Anion gap 9 5 - 15    Comment: Performed at [facility], [place] [address], [place], [place] [address]  CBC with Differential     Status: Abnormal   Collection Time: 01/23/23  3:47 PM  Result Value Ref Range   WBC 14.2 (H) 4.0 - 10.5 K/uL   RBC 2.54 (L) 4.22 - 5.81 MIL/uL   Hemoglobin  8.4 (L) 13.0 - 17.0 g/dL   HCT 24.1 (L) 39.0 - 52.0 %   MCV 94.9 80.0 - 100.0 fL   MCH 33.1 26.0 - 34.0 pg   MCHC 34.9 30.0 - 36.0 g/dL   RDW 13.3 11.5 - 15.5 %   Platelets 116 (L) 150 - 400  K/uL    Comment: SPECIMEN CHECKED FOR CLOTS REPEATED TO VERIFY    nRBC 0.2 0.0 - 0.2 %   Neutrophils Relative % 86 %   Neutro Abs 12.2 (H) 1.7 - 7.7 K/uL   Lymphocytes Relative 4 %   Lymphs Abs 0.5 (L) 0.7 - 4.0 K/uL   Monocytes Relative 6 %   Monocytes Absolute 0.9 0.1 - 1.0 K/uL   Eosinophils Relative 1 %   Eosinophils Absolute 0.2 0.0 - 0.5 K/uL   Basophils Relative 0 %   Basophils Absolute 0.1 0.0 - 0.1 K/uL   Immature Granulocytes 3 %   Abs Immature Granulocytes 0.37 (H) 0.00 - 0.07 K/uL    Comment: Performed at [facility] [address], [place], [place] [address]  Protime-INR     Status: Abnormal   Collection Time: 01/23/23  3:47 PM  Result Value Ref Range   Prothrombin Time 20.6 (H) 11.4 - 15.2 seconds   INR 1.8 (H) 0.8 - 1.2    Comment: (NOTE) INR goal varies based on device and disease states. Performed at [facility], [place] [address], [place], [place] [address]   APTT     Status: None   Collection Time: 01/23/23  3:47 PM  Result Value Ref Range   aPTT 36 24 - 36 seconds    Comment: Performed at [facility] [address] [place], [place] [address]  Blood Culture (routine x 2)     Status: None (Preliminary result)   Collection Time: 01/23/23  3:49 PM   Specimen: BLOOD  Result Value Ref Range   Specimen Description BLOOD BLOOD RIGHT WRIST    Special Requests      BOTTLES DRAWN AEROBIC AND ANAEROBIC Blood Culture adequate volume   Culture  Setup Time      GRAM NEGATIVE RODS IN BOTH AEROBIC AND ANAEROBIC BOTTLES CRITICAL VALUE NOTED.  VALUE IS CONSISTENT WITH PREVIOUSLY REPORTED AND CALLED VALUE. Performed at [facility], [place] [address] [place], [place] [address]    Culture GRAM NEGATIVE RODS    Report Status PENDING   Urinalysis, w/  Reflex to Culture (Infection Suspected) -Urine, Clean Catch     Status: Abnormal   Collection Time: 01/23/23  6:40 PM  Result Value Ref Range   Specimen Source URINE, CLEAN CATCH    Color, Urine YELLOW YELLOW   APPearance CLOUDY (A) CLEAR   Specific Gravity, Urine 1.010 1.005 - 1.030   pH 5.0 5.0 - 8.0   Glucose, UA NEGATIVE NEGATIVE mg/dL   Hgb urine dipstick LARGE (A) NEGATIVE   Bilirubin Urine NEGATIVE NEGATIVE   Ketones, ur NEGATIVE NEGATIVE mg/dL   Protein, ur NEGATIVE NEGATIVE mg/dL   Nitrite NEGATIVE NEGATIVE   Leukocytes,Ua LARGE (A) NEGATIVE   RBC / HPF >50 0 - 5 RBC/hpf   WBC, UA >50 0 - 5 WBC/hpf    Comment:        Reflex urine culture not performed if WBC <=10, OR if Squamous epithelial cells >5. If Squamous epithelial cells >5 suggest recollection.    Bacteria, UA MANY (A) NONE SEEN   Squamous Epithelial / HPF 0-5 0 - 5 /HPF   WBC Clumps PRESENT    Mucus PRESENT     Comment: Performed at [facility], [place] [address] [place], [place] [address]  Lactic acid, plasma     Status: Abnormal   Collection Time: 01/23/23  7:54 PM  Result Value Ref Range  Lactic Acid, Venous 2.1 (HH) 0.5 - 1.9 mmol/L    Comment: CRITICAL RESULT CALLED TO, READ BACK BY AND VERIFIED WITH ISAAC LANE RN 01/23/23 2113 [address] Ke Performed at [facility] [address] [place], [place] [address]   I-Stat Chem 8, ED     Status: Abnormal   Collection Time: 01/23/23  8:01 PM  Result Value Ref Range   Sodium 134 (L) 135 - 145 mmol/L   Potassium 4.9 3.5 - 5.1 mmol/L   Chloride 104 98 - 111 mmol/L   BUN 92 (H) 8 - 23 mg/dL   Creatinine, Ser 12.40 (H) 0.61 - 1.24 mg/dL   Glucose, Bld 104 (H) 70 - 99 mg/dL    Comment: Glucose reference range applies only to samples taken after fasting for at least 8 hours.   Calcium, Ion 1.11 (L) 1.15 - 1.40 mmol/L   TCO2 17 (L) 22 - 32 mmol/L   Hemoglobin 9.2 (L) 13.0 - 17.0 g/dL   HCT 27.0 (L) 39.0 - 52.0 %  Ammonia     Status: None    Collection Time: 01/23/23 11:45 PM  Result Value Ref Range   Ammonia 28 9 - 35 umol/L    Comment: HEMOLYSIS AT THIS LEVEL MAY AFFECT RESULT Performed at [facility], [place] [address] [place], [place] [address]   CK     Status: Abnormal   Collection Time: 01/23/23 11:45 PM  Result Value Ref Range   Total CK 575 (H) 49 - 397 U/L    Comment: Performed at [facility], [place] [address] [place], [place] [address]  Osmolality     Status: Abnormal   Collection Time: 01/23/23 11:45 PM  Result Value Ref Range   Osmolality 313 (H) 275 - 295 mOsm/kg    Comment: REPEATED TO VERIFY Performed at [facility], [place] [place], [place] [address]   Vitamin B12     Status: None   Collection Time: 01/23/23 11:45 PM  Result Value Ref Range   Vitamin B-12 416 180 - 914 pg/mL    Comment: (NOTE) This assay is not validated for testing neonatal or myeloproliferative syndrome specimens for Vitamin B12 levels. Performed at [facility], [place] [address], [place], [place] [address]   Iron and TIBC     Status: Abnormal   Collection Time: 01/23/23 11:45 PM  Result Value Ref Range   Iron 25 (L) 45 - 182 ug/dL   TIBC 182 (L) 250 - 450 ug/dL   Saturation Ratios 14 (L) 17.9 - 39.5 %   UIBC 157 ug/dL    Comment: Performed at [facility] [address] [place], [place] [address]  Ferritin     Status: Abnormal   Collection Time: 01/23/23 11:45 PM  Result Value Ref Range   Ferritin 784 (H) 24 - 336 ng/mL    Comment: Performed at [facility], [place] [address], [place], [place] [address]  Reticulocytes     Status: Abnormal   Collection Time: 01/23/23 11:45 PM  Result Value Ref Range   Retic Ct Pct 2.5 0.4 - 3.1 %   RBC. 2.31 (L) 4.22 - 5.81 MIL/uL   Retic Count, Absolute 57.3 19.0 - 186.0 K/uL   Immature Retic Fract 9.9 2.3 - 15.9 %    Comment: Performed at [facility] [address] [place], [place] [address]  Folate     Status: None   Collection  Time:  01/23/23 11:45 PM  Result Value Ref Range   Folate 22.4 >5.9 ng/mL    Comment: RESULT CONFIRMED BY MANUAL DILUTION Performed at [facility], [place] [address], [place], [place] [address]   TSH     Status: Abnormal   Collection Time: 01/23/23 11:45 PM  Result Value Ref Range   TSH 4.894 (H) 0.350 - 4.500 uIU/mL    Comment: Performed by a 3rd Generation assay with a functional sensitivity of <=0.01 uIU/mL. Performed at [facility], [place] [address] [place], [place] [address]   CBC with Differential     Status: Abnormal   Collection Time: 01/23/23 11:45 PM  Result Value Ref Range   WBC 17.6 (H) 4.0 - 10.5 K/uL   RBC 2.26 (L) 4.22 - 5.81 MIL/uL   Hemoglobin 7.6 (L) 13.0 - 17.0 g/dL   HCT 22.0 (L) 39.0 - 52.0 %   MCV 97.3 80.0 - 100.0 fL   MCH 33.6 26.0 - 34.0 pg   MCHC 34.5 30.0 - 36.0 g/dL   RDW 13.6 11.5 - 15.5 %   Platelets 84 (L) 150 - 400 K/uL    Comment: Immature Platelet Fraction may be clinically indicated, consider ordering this additional test [id number] REPEATED TO VERIFY    nRBC 0.0 0.0 - 0.2 %   Neutrophils Relative % 86 %   Neutro Abs 15.3 (H) 1.7 - 7.7 K/uL   Lymphocytes Relative 4 %   Lymphs Abs 0.6 (L) 0.7 - 4.0 K/uL   Monocytes Relative 7 %   Monocytes Absolute 1.2 (H) 0.1 - 1.0 K/uL   Eosinophils Relative 0 %   Eosinophils Absolute 0.0 0.0 - 0.5 K/uL   Basophils Relative 1 %   Basophils Absolute 0.1 0.0 - 0.1 K/uL   Immature Granulocytes 2 %   Abs Immature Granulocytes 0.35 (H) 0.00 - 0.07 K/uL    Comment: Performed at [facility] [address], [place], [place] [address]  Procalcitonin     Status: None   Collection Time: 01/23/23 11:45 PM  Result Value Ref Range   Procalcitonin 33.97 ng/mL    Comment:        Interpretation: PCT >= 10 ng/mL: Important systemic inflammatory response, almost exclusively due to severe bacterial sepsis or septic shock. (NOTE)       Sepsis PCT Algorithm           Lower Respiratory Tract                                       Infection PCT Algorithm    ----------------------------     ----------------------------         PCT < 0.25 ng/mL                PCT < 0.10 ng/mL          Strongly encourage             Strongly discourage   discontinuation of antibiotics    initiation of antibiotics    ----------------------------     -----------------------------       PCT 0.25 - 0.50 ng/mL            PCT 0.10 - 0.25 ng/mL               OR       >80% decrease in PCT  Discourage initiation of                                            antibiotics      Encourage discontinuation           of antibiotics    ----------------------------     -----------------------------         PCT >= 0.50 ng/mL              PCT 0.26 - 0.50 ng/mL                AND       <80% decrease in PCT             Encourage initiation of                                             antibiotics       Encourage continuation           of antibiotics    ----------------------------     -----------------------------        PCT >= 0.50 ng/mL                  PCT > 0.50 ng/mL               AND         increase in PCT                  Strongly encourage                                      initiation of antibiotics    Strongly encourage escalation           of antibiotics                                     -----------------------------                                           PCT <= 0.25 ng/mL                                                 OR                                        > 80% decrease in PCT                                      Discontinue / Do not initiate  antibiotics  Performed at [facility], [place] [address] [place], [place] [address]   I-Stat venous blood gas, ED     Status: Abnormal   Collection Time: 01/23/23 11:58 PM  Result Value Ref Range   pH, Ven 7.403 7.25 - 7.43   pCO2, Ven 33.6 (L) 44 - 60 mmHg   pO2, Ven 145 (H) 32 - 45 mmHg    Bicarbonate 21.0 20.0 - 28.0 mmol/L   TCO2 22 22 - 32 mmol/L   O2 Saturation 99 %   Acid-base deficit 3.0 (H) 0.0 - 2.0 mmol/L   Sodium 136 135 - 145 mmol/L   Potassium 5.6 (H) 3.5 - 5.1 mmol/L   Calcium, Ion 1.13 (L) 1.15 - 1.40 mmol/L   HCT 22.0 (L) 39.0 - 52.0 %   Hemoglobin 7.5 (L) 13.0 - 17.0 g/dL   Sample type VENOUS   Lactic acid, plasma     Status: None   Collection Time: 01/24/23  4:19 AM  Result Value Ref Range   Lactic Acid, Venous 1.6 0.5 - 1.9 mmol/L    Comment: Performed at [facility], [place] [address], [place], [place] [address]  CK     Status: Abnormal   Collection Time: 01/24/23  4:19 AM  Result Value Ref Range   Total CK 459 (H) 49 - 397 U/L    Comment: Performed at [facility], [place] [address], [place], [place] [address]   VAS [place] LOWER EXTREMITY VENOUS (DVT) (7a-7p)  Result Date: 01/24/2023  Lower Venous DVT Study Patient Name:  AMMAAR ENCINA  Date of Exam:   01/23/2023 Medical Rec #: [id number]             Accession #:    [id number] Date of Birth: 19-Oct-1956             Patient Gender: M Patient Age:   42 years Exam Location:  [facility] Procedure:      VAS [place] LOWER EXTREMITY VENOUS (DVT) Referring Phys: Garnette Gunner --------------------------------------------------------------------------------  Indications: Edema.  Risk Factors: Surgery. Limitations: Poor ultrasound/tissue interface. Comparison Study: No prior studies. Performing Technologist: Oliver Hum RVT  Examination Guidelines: A complete evaluation includes B-mode imaging, spectral Doppler, color Doppler, and power Doppler as needed of all accessible portions of each vessel. Bilateral testing is considered an integral part of a complete examination. Limited examinations for reoccurring indications may be performed as noted. The reflux portion of the exam is performed with the patient in reverse Trendelenburg.  +-----+---------------+---------+-----------+----------+--------------+  RIGHTCompressibilityPhasicitySpontaneityPropertiesThrombus Aging +-----+---------------+---------+-----------+----------+--------------+ CFV  Full           Yes      Yes                                 +-----+---------------+---------+-----------+----------+--------------+   +---------+---------------+---------+-----------+----------+--------------+ LEFT     CompressibilityPhasicitySpontaneityPropertiesThrombus Aging +---------+---------------+---------+-----------+----------+--------------+ CFV      Full           Yes      Yes                                 +---------+---------------+---------+-----------+----------+--------------+ SFJ      Full                                                        +---------+---------------+---------+-----------+----------+--------------+  FV Prox  Full                                                        +---------+---------------+---------+-----------+----------+--------------+ FV Mid   Full           Yes      Yes                                 +---------+---------------+---------+-----------+----------+--------------+ FV DistalFull                                                        +---------+---------------+---------+-----------+----------+--------------+ PFV      Full                                                        +---------+---------------+---------+-----------+----------+--------------+ POP      Full           Yes      Yes                                 +---------+---------------+---------+-----------+----------+--------------+ PTV      Full                                                        +---------+---------------+---------+-----------+----------+--------------+ PERO     Full                                                        +---------+---------------+---------+-----------+----------+--------------+     Summary: RIGHT: - No evidence of common femoral vein  obstruction.  LEFT: - There is no evidence of deep vein thrombosis in the lower extremity.  - No cystic structure found in the popliteal fossa.  *See table(s) above for measurements and observations. Electronically signed by Jamelle Haring on 01/24/2023 at 7:31:58 AM.    Final    MR LUMBAR SPINE WO CONTRAST  Result Date: 01/23/2023 CLINICAL DATA:  Back pain EXAM: MRI CERVICAL, THORACIC AND LUMBAR SPINE WITHOUT CONTRAST TECHNIQUE: Multiplanar and multiecho pulse sequences of the cervical spine, to include the craniocervical junction and cervicothoracic junction, and thoracic and lumbar spine, were obtained without intravenous contrast. COMPARISON:  09/18/2022 FINDINGS: MRI CERVICAL SPINE FINDINGS Alignment: Physiologic. Vertebrae: C3-6 ACDF.  No acute abnormality. Cord: Myelomalacia at the C3-4 level, unchanged. Posterior Fossa, vertebral arteries, paraspinal tissues: Negative. Disc levels: C1-2: Unremarkable. C2-3: Mild facet hypertrophy with small disc bulge and uncinate spurring. There is no spinal canal stenosis. Bilateral neural foraminal stenosis. C3-4: ACDF. Unchanged small disc bulge. Unchanged severe spinal canal  stenosis. No neural foraminal stenosis. C4-5: ACDF. Mild facet hypertrophy. There is no spinal canal stenosis. No neural foraminal stenosis. C5-6: ACDF with small disc bulge and uncinate spurring. Unchanged mild spinal canal stenosis. Unchanged moderate bilateral neural foraminal stenosis. C6-7: Small disc bulge. There is no spinal canal stenosis. No neural foraminal stenosis. C7-T1: Normal disc space and facet joints. There is no spinal canal stenosis. No neural foraminal stenosis. MRI THORACIC SPINE FINDINGS Alignment:  Physiologic. Vertebrae: No fracture, evidence of discitis, or bone lesion. Modic type 2 endplate signal changes at L4-5, unchanged. Cord:  Normal signal and morphology. Paraspinal and other soft tissues: Negative. Disc levels: There is mild multilevel degenerative disc disease,  greatest at T9-10 where there is mild spinal canal stenosis. No stenosis at any other level. MRI LUMBAR SPINE FINDINGS Segmentation:  Standard. Alignment:  Physiologic. Vertebrae:  No fracture, evidence of discitis, or bone lesion. Conus medullaris and cauda equina: Conus extends to the T12 level. Conus and cauda equina appear normal. Paraspinal and other soft tissues: Negative Disc levels: L1-L2: Normal disc space and facet joints. No spinal canal stenosis. No neural foraminal stenosis. L2-L3: Small disc bulge, unchanged. Unchanged mild spinal canal stenosis. No neural foraminal stenosis. L3-L4: Unchanged small disc bulge. Mild spinal canal stenosis. No neural foraminal stenosis. L4-L5: Unchanged intermediate sized disc bulge, right asymmetric. No spinal canal stenosis. Unchanged mild right neural foraminal stenosis. L5-S1: Normal disc space and facet joints. No spinal canal stenosis. No neural foraminal stenosis. Visualized sacrum: Normal. IMPRESSION: 1. C3-6 ACDF with unchanged severe spinal canal stenosis at C3-4 and focal myelomalacia. 2. Unchanged moderate bilateral C5-6 neural foraminal stenosis. 3. Mild multilevel thoracic degenerative disc disease with mild spinal canal stenosis at T9-10. 4. Unchanged mild spinal canal stenosis at L2-3, L3-4 and L4-5. 5. Unchanged mild right L4-5 neural foraminal stenosis. Electronically Signed   By: Ulyses Jarred M.D.   On: 01/23/2023 22:33   MR THORACIC SPINE WO CONTRAST  Result Date: 01/23/2023 CLINICAL DATA:  Back pain EXAM: MRI CERVICAL, THORACIC AND LUMBAR SPINE WITHOUT CONTRAST TECHNIQUE: Multiplanar and multiecho pulse sequences of the cervical spine, to include the craniocervical junction and cervicothoracic junction, and thoracic and lumbar spine, were obtained without intravenous contrast. COMPARISON:  09/18/2022 FINDINGS: MRI CERVICAL SPINE FINDINGS Alignment: Physiologic. Vertebrae: C3-6 ACDF.  No acute abnormality. Cord: Myelomalacia at the C3-4 level,  unchanged. Posterior Fossa, vertebral arteries, paraspinal tissues: Negative. Disc levels: C1-2: Unremarkable. C2-3: Mild facet hypertrophy with small disc bulge and uncinate spurring. There is no spinal canal stenosis. Bilateral neural foraminal stenosis. C3-4: ACDF. Unchanged small disc bulge. Unchanged severe spinal canal stenosis. No neural foraminal stenosis. C4-5: ACDF. Mild facet hypertrophy. There is no spinal canal stenosis. No neural foraminal stenosis. C5-6: ACDF with small disc bulge and uncinate spurring. Unchanged mild spinal canal stenosis. Unchanged moderate bilateral neural foraminal stenosis. C6-7: Small disc bulge. There is no spinal canal stenosis. No neural foraminal stenosis. C7-T1: Normal disc space and facet joints. There is no spinal canal stenosis. No neural foraminal stenosis. MRI THORACIC SPINE FINDINGS Alignment:  Physiologic. Vertebrae: No fracture, evidence of discitis, or bone lesion. Modic type 2 endplate signal changes at L4-5, unchanged. Cord:  Normal signal and morphology. Paraspinal and other soft tissues: Negative. Disc levels: There is mild multilevel degenerative disc disease, greatest at T9-10 where there is mild spinal canal stenosis. No stenosis at any other level. MRI LUMBAR SPINE FINDINGS Segmentation:  Standard. Alignment:  Physiologic. Vertebrae:  No fracture, evidence of discitis, or bone lesion.  Conus medullaris and cauda equina: Conus extends to the T12 level. Conus and cauda equina appear normal. Paraspinal and other soft tissues: Negative Disc levels: L1-L2: Normal disc space and facet joints. No spinal canal stenosis. No neural foraminal stenosis. L2-L3: Small disc bulge, unchanged. Unchanged mild spinal canal stenosis. No neural foraminal stenosis. L3-L4: Unchanged small disc bulge. Mild spinal canal stenosis. No neural foraminal stenosis. L4-L5: Unchanged intermediate sized disc bulge, right asymmetric. No spinal canal stenosis. Unchanged mild right neural  foraminal stenosis. L5-S1: Normal disc space and facet joints. No spinal canal stenosis. No neural foraminal stenosis. Visualized sacrum: Normal. IMPRESSION: 1. C3-6 ACDF with unchanged severe spinal canal stenosis at C3-4 and focal myelomalacia. 2. Unchanged moderate bilateral C5-6 neural foraminal stenosis. 3. Mild multilevel thoracic degenerative disc disease with mild spinal canal stenosis at T9-10. 4. Unchanged mild spinal canal stenosis at L2-3, L3-4 and L4-5. 5. Unchanged mild right L4-5 neural foraminal stenosis. Electronically Signed   By: Ulyses Jarred M.D.   On: 01/23/2023 22:33   MR Cervical Spine Wo Contrast  Result Date: 01/23/2023 CLINICAL DATA:  Back pain EXAM: MRI CERVICAL, THORACIC AND LUMBAR SPINE WITHOUT CONTRAST TECHNIQUE: Multiplanar and multiecho pulse sequences of the cervical spine, to include the craniocervical junction and cervicothoracic junction, and thoracic and lumbar spine, were obtained without intravenous contrast. COMPARISON:  09/18/2022 FINDINGS: MRI CERVICAL SPINE FINDINGS Alignment: Physiologic. Vertebrae: C3-6 ACDF.  No acute abnormality. Cord: Myelomalacia at the C3-4 level, unchanged. Posterior Fossa, vertebral arteries, paraspinal tissues: Negative. Disc levels: C1-2: Unremarkable. C2-3: Mild facet hypertrophy with small disc bulge and uncinate spurring. There is no spinal canal stenosis. Bilateral neural foraminal stenosis. C3-4: ACDF. Unchanged small disc bulge. Unchanged severe spinal canal stenosis. No neural foraminal stenosis. C4-5: ACDF. Mild facet hypertrophy. There is no spinal canal stenosis. No neural foraminal stenosis. C5-6: ACDF with small disc bulge and uncinate spurring. Unchanged mild spinal canal stenosis. Unchanged moderate bilateral neural foraminal stenosis. C6-7: Small disc bulge. There is no spinal canal stenosis. No neural foraminal stenosis. C7-T1: Normal disc space and facet joints. There is no spinal canal stenosis. No neural foraminal stenosis.  MRI THORACIC SPINE FINDINGS Alignment:  Physiologic. Vertebrae: No fracture, evidence of discitis, or bone lesion. Modic type 2 endplate signal changes at L4-5, unchanged. Cord:  Normal signal and morphology. Paraspinal and other soft tissues: Negative. Disc levels: There is mild multilevel degenerative disc disease, greatest at T9-10 where there is mild spinal canal stenosis. No stenosis at any other level. MRI LUMBAR SPINE FINDINGS Segmentation:  Standard. Alignment:  Physiologic. Vertebrae:  No fracture, evidence of discitis, or bone lesion. Conus medullaris and cauda equina: Conus extends to the T12 level. Conus and cauda equina appear normal. Paraspinal and other soft tissues: Negative Disc levels: L1-L2: Normal disc space and facet joints. No spinal canal stenosis. No neural foraminal stenosis. L2-L3: Small disc bulge, unchanged. Unchanged mild spinal canal stenosis. No neural foraminal stenosis. L3-L4: Unchanged small disc bulge. Mild spinal canal stenosis. No neural foraminal stenosis. L4-L5: Unchanged intermediate sized disc bulge, right asymmetric. No spinal canal stenosis. Unchanged mild right neural foraminal stenosis. L5-S1: Normal disc space and facet joints. No spinal canal stenosis. No neural foraminal stenosis. Visualized sacrum: Normal. IMPRESSION: 1. C3-6 ACDF with unchanged severe spinal canal stenosis at C3-4 and focal myelomalacia. 2. Unchanged moderate bilateral C5-6 neural foraminal stenosis. 3. Mild multilevel thoracic degenerative disc disease with mild spinal canal stenosis at T9-10. 4. Unchanged mild spinal canal stenosis at L2-3, L3-4 and L4-5. 5. Unchanged  mild right L4-5 neural foraminal stenosis. Electronically Signed   By: Ulyses Jarred M.D.   On: 01/23/2023 22:33   CT ABDOMEN PELVIS WO CONTRAST  Result Date: 01/23/2023 CLINICAL DATA:  Kidney failure, acute renal failure EXAM: CT ABDOMEN AND PELVIS WITHOUT CONTRAST TECHNIQUE: Multidetector CT imaging of the abdomen and pelvis was  performed following the standard protocol without IV contrast. RADIATION DOSE REDUCTION: This exam was performed according to the departmental dose-optimization program which includes automated exposure control, adjustment of the mA and/or kV according to patient size and/or use of iterative reconstruction technique. COMPARISON:  CT 03/26/2018. FINDINGS: Lower chest: No acute abnormality. Hepatobiliary: No focal liver abnormality is seen. Decompressed gallbladder with cholelithiasis. Pancreas: Unremarkable. No pancreatic ductal dilatation or surrounding inflammatory changes. Spleen: Normal in size without focal abnormality. Adrenals/Urinary Tract: Adrenal glands are unremarkable. Moderate bilateral hydronephrosis. No nephroureterolithiasis. Mild perinephric stranding bilaterally. Markedly dilated urinary bladder. Stomach/Bowel: The stomach is within normal limits. There is no evidence of bowel obstruction.The appendix is normal. Vascular/Lymphatic: Aortoiliac atherosclerosis. No AAA. No lymphadenopathy. Retroaortic left renal vein. Reproductive: Enlarged prostate gland. Bilateral scrotal varicoceles. Other: Small fat containing umbilical hernia. No abdominopelvic ascites. Musculoskeletal: No acute osseous abnormality. No suspicious osseous lesion. Prior left hip arthroplasty. Normal alignment. Moderate right hip osteoarthritis. Degenerative changes of the spine most prominent at L4-L5. IMPRESSION: Findings suggestive of bladder outlet obstruction due to an enlarged prostate gland. Markedly distended urinary bladder and moderate bilateral hydroureteronephrosis. No other acute findings in the abdomen or pelvis. Additional chronic findings as described above. Electronically Signed   By: Maurine Simmering M.D.   On: 01/23/2023 18:46   DG Hip Unilat With Pelvis 2-3 Views Left  Result Date: 01/23/2023 CLINICAL DATA:  Sepsis, surgery EXAM: DG HIP (WITH OR WITHOUT PELVIS) 3V LEFT COMPARISON:  08/14/2021. FINDINGS: Status post  left total hip arthroplasty. No periprosthetic lucency to suggest loosening. No acute fracture, dislocation or subluxation. Pelvic ring intact. IMPRESSION: Unremarkable appearance status post left total hip arthroplasty. Electronically Signed   By: Sammie Bench M.D.   On: 01/23/2023 17:29   DG Chest 1 View  Result Date: 01/23/2023 CLINICAL DATA:  Recent surgery, sepsis EXAM: CHEST  1 VIEW COMPARISON:  None Available. FINDINGS: The heart is enlarged. The lungs are clear. There is no pleural effusion or pneumothorax. No acute fractures are seen. IMPRESSION: Cardiomegaly. No acute pulmonary process. Electronically Signed   By: Ronney Asters M.D.   On: 01/23/2023 17:29    Pending Labs Unresulted Labs (From admission, onward)     Start     Ordered   01/25/23 0500  CBC with Differential/Platelet  Tomorrow morning,   R        01/24/23 0852   01/25/23 0500  Comprehensive metabolic panel  Tomorrow morning,   R        01/24/23 0852   01/25/23 0500  Magnesium  Tomorrow morning,   R        01/24/23 0852   01/23/23 1840  Urine Culture  Once,   R        01/23/23 1840            Vitals/Pain Today's Vitals   01/24/23 0530 01/24/23 0545 01/24/23 0717 01/24/23 0843  BP: (!) 128/90 124/82  (!) 140/93  Pulse: 75 80  78  Resp: '17 17  16  '$ Temp:   (!) 97.5 F (36.4 C)   TempSrc:   Oral   SpO2: 97% 95%  100%  Weight:  Height:      PainSc:        Isolation Precautions No active isolations  Medications Medications  lactated ringers infusion ( Intravenous New Bag/Given 01/24/23 0811)  levothyroxine (SYNTHROID) tablet 200 mcg (has no administration in time range)  0.9 %  sodium chloride infusion ( Intravenous Not Given 01/24/23 0941)  acetaminophen (TYLENOL) tablet 650 mg (has no administration in time range)    Or  acetaminophen (TYLENOL) suppository 650 mg (has no administration in time range)  HYDROcodone-acetaminophen (NORCO/VICODIN) 5-325 MG per tablet 1-2 tablet (has no administration  in time range)  cefTRIAXone (ROCEPHIN) 2 g in sodium chloride 0.9 % 100 mL IVPB (has no administration in time range)  lactated ringers bolus 2,520 mL (0 mLs Intravenous Stopped 01/23/23 1842)  cefTRIAXone (ROCEPHIN) 2 g in sodium chloride 0.9 % 100 mL IVPB (0 g Intravenous Stopped 01/23/23 1823)  vancomycin (VANCOREADY) IVPB 2000 mg/400 mL (0 mg Intravenous Stopped 01/24/23 0240)  acetaminophen (TYLENOL) tablet 1,000 mg (1,000 mg Oral Given 01/23/23 1927)  lactated ringers bolus 1,000 mL (0 mLs Intravenous Stopped 01/24/23 0130)    Mobility non-ambulatory     Focused Assessments    R Recommendations: See Admitting Provider Note  Report given to:   Additional Notes:

## 2023-01-24 NOTE — ED Notes (Signed)
Patient changed into new gown due to being soiled. Patient's linen changed and new chux provided. Drained out the foley bag due to being full. Patient is placed on cardiac monitor.

## 2023-01-25 DIAGNOSIS — N179 Acute kidney failure, unspecified: Secondary | ICD-10-CM | POA: Diagnosis not present

## 2023-01-25 LAB — COMPREHENSIVE METABOLIC PANEL
ALT: 20 U/L (ref 0–44)
AST: 36 U/L (ref 15–41)
Albumin: 2.5 g/dL — ABNORMAL LOW (ref 3.5–5.0)
Alkaline Phosphatase: 47 U/L (ref 38–126)
Anion gap: 5 (ref 5–15)
BUN: 29 mg/dL — ABNORMAL HIGH (ref 8–23)
CO2: 25 mmol/L (ref 22–32)
Calcium: 8.1 mg/dL — ABNORMAL LOW (ref 8.9–10.3)
Chloride: 109 mmol/L (ref 98–111)
Creatinine, Ser: 2.17 mg/dL — ABNORMAL HIGH (ref 0.61–1.24)
GFR, Estimated: 33 mL/min — ABNORMAL LOW (ref >60)
Glucose, Bld: 120 mg/dL — ABNORMAL HIGH (ref 70–99)
Potassium: 3.7 mmol/L (ref 3.5–5.1)
Sodium: 139 mmol/L (ref 135–145)
Total Bilirubin: 0.9 mg/dL (ref 0.3–1.2)
Total Protein: 5.6 g/dL — ABNORMAL LOW (ref 6.5–8.1)

## 2023-01-25 LAB — CBC WITH DIFFERENTIAL/PLATELET
Abs Immature Granulocytes: 0.18 10*3/uL — ABNORMAL HIGH (ref 0.00–0.07)
Basophils Absolute: 0.1 10*3/uL (ref 0.0–0.1)
Basophils Relative: 0 %
Eosinophils Absolute: 0.3 10*3/uL (ref 0.0–0.5)
Eosinophils Relative: 3 %
HCT: 22.2 % — ABNORMAL LOW (ref 39.0–52.0)
Hemoglobin: 8 g/dL — ABNORMAL LOW (ref 13.0–17.0)
Immature Granulocytes: 1 %
Lymphocytes Relative: 5 %
Lymphs Abs: 0.7 10*3/uL (ref 0.7–4.0)
MCH: 33.6 pg (ref 26.0–34.0)
MCHC: 36 g/dL (ref 30.0–36.0)
MCV: 93.3 fL (ref 80.0–100.0)
Monocytes Absolute: 0.8 10*3/uL (ref 0.1–1.0)
Monocytes Relative: 7 %
Neutro Abs: 10.8 10*3/uL — ABNORMAL HIGH (ref 1.7–7.7)
Neutrophils Relative %: 84 %
Platelets: 114 10*3/uL — ABNORMAL LOW (ref 150–400)
RBC: 2.38 MIL/uL — ABNORMAL LOW (ref 4.22–5.81)
RDW: 13.5 % (ref 11.5–15.5)
WBC: 12.8 10*3/uL — ABNORMAL HIGH (ref 4.0–10.5)
nRBC: 0 % (ref 0.0–0.2)

## 2023-01-25 LAB — URINE CULTURE: Culture: >=100000 — AB

## 2023-01-25 LAB — MAGNESIUM: Magnesium: 1.8 mg/dL (ref 1.7–2.4)

## 2023-01-25 NOTE — Progress Notes (Signed)
PT Cancellation Note  Patient Details Name: Alex West MRN: [id number] DOB: [birth year]   Cancelled Treatment:    Reason Eval/Treat Not Completed: Patient declined, no reason specified  States he is tired and going to be eating pizza shortly with his wife. Declines to work with PT today. Will follow and progress during admission.  Ellouise Newer 01/25/2023, 3:05 PM

## 2023-01-25 NOTE — Progress Notes (Signed)
[facility] Progress Note   67 y.o. male with pmhx autoimmune hepatitis, BPH, anemia, hypothyroidism and recent left hip replacement p/w worsening lower extremity weakness, incontinence, temp 101.7 and hypotensive concerning for sepsis with obstructive uropathy noted.    Assessment/ Plan:   AKI: Creatinine initially 14.05>12.4 from baseline 1.13. CT reveals bladder outlet obstruction, moderate bilateral hydroureteronephrosis, prostate enlargement.  Highly suggestive of obstructive causes and likely in combination with infection.   Renal function continues to improve with great UOP.  Should be discharged with the foley in place + Flomax + urology f/u in the next 2 weeks for a voiding trial. He already has a urologist he's seen, just needs to make an appt.   Signing off at this time; please reconsult as needed.  Sepsis: Culture reveals Klebsiella pneumonia, antibiotics per primary service.  Agree with discontinuation of vancomycin. Hypotension: Appears resolved at this time.  BPH: started Flomax.  Subjective:   Feeling better, stronger, ambulating down the hall with OT assistance.   Objective:   BP 113/76 (BP Location: Right Arm)   Pulse 71   Temp 99.4 F (37.4 C) (Oral)   Resp 18   Ht '6\' 3"'$  (1.905 m)   Wt 108 kg   SpO2 94%   BMI 29.76 kg/m   Intake/Output Summary (Last 24 hours) at 01/25/2023 1208 Last data filed at 01/25/2023 0830 Gross per 24 hour  Intake 480 ml  Output 4425 ml  Net -3945 ml   Weight change:   Physical Exam: General appearance: cooperative in no distress Resp: CTA b/l Cardio: RRR GI: SNDNT+BS Extremities: edema tr Neurologic: Mental status: A&Ox3 GU: foley to gravity  Imaging: VAS [place] LOWER EXTREMITY VENOUS (DVT) (7a-7p)  Result Date: 01/24/2023  Lower Venous DVT Study Patient Name:  Alex West  Date of Exam:   01/23/2023 Medical Rec #: [id number]             Accession #:    [id number] Date of Birth: 1956-02-14              Patient Gender: M Patient Age:   45 years Exam Location:  [facility] Procedure:      VAS [place] LOWER EXTREMITY VENOUS (DVT) Referring Phys: Garnette Gunner --------------------------------------------------------------------------------  Indications: Edema.  Risk Factors: Surgery. Limitations: Poor ultrasound/tissue interface. Comparison Study: No prior studies. Performing Technologist: Oliver Hum RVT  Examination Guidelines: A complete evaluation includes B-mode imaging, spectral Doppler, color Doppler, and power Doppler as needed of all accessible portions of each vessel. Bilateral testing is considered an integral part of a complete examination. Limited examinations for reoccurring indications may be performed as noted. The reflux portion of the exam is performed with the patient in reverse Trendelenburg.  +-----+---------------+---------+-----------+----------+--------------+ RIGHTCompressibilityPhasicitySpontaneityPropertiesThrombus Aging +-----+---------------+---------+-----------+----------+--------------+ CFV  Full           Yes      Yes                                 +-----+---------------+---------+-----------+----------+--------------+   +---------+---------------+---------+-----------+----------+--------------+ LEFT     CompressibilityPhasicitySpontaneityPropertiesThrombus Aging +---------+---------------+---------+-----------+----------+--------------+ CFV      Full           Yes      Yes                                 +---------+---------------+---------+-----------+----------+--------------+ SFJ      Full                                                        +---------+---------------+---------+-----------+----------+--------------+  FV Prox  Full                                                        +---------+---------------+---------+-----------+----------+--------------+ FV Mid   Full           Yes      Yes                                  +---------+---------------+---------+-----------+----------+--------------+ FV DistalFull                                                        +---------+---------------+---------+-----------+----------+--------------+ PFV      Full                                                        +---------+---------------+---------+-----------+----------+--------------+ POP      Full           Yes      Yes                                 +---------+---------------+---------+-----------+----------+--------------+ PTV      Full                                                        +---------+---------------+---------+-----------+----------+--------------+ PERO     Full                                                        +---------+---------------+---------+-----------+----------+--------------+     Summary: RIGHT: - No evidence of common femoral vein obstruction.  LEFT: - There is no evidence of deep vein thrombosis in the lower extremity.  - No cystic structure found in the popliteal fossa.  *See table(s) above for measurements and observations. Electronically signed by Jamelle Haring on 01/24/2023 at 7:31:58 AM.    Final    MR LUMBAR SPINE WO CONTRAST  Result Date: 01/23/2023 CLINICAL DATA:  Back pain EXAM: MRI CERVICAL, THORACIC AND LUMBAR SPINE WITHOUT CONTRAST TECHNIQUE: Multiplanar and multiecho pulse sequences of the cervical spine, to include the craniocervical junction and cervicothoracic junction, and thoracic and lumbar spine, were obtained without intravenous contrast. COMPARISON:  09/18/2022 FINDINGS: MRI CERVICAL SPINE FINDINGS Alignment: Physiologic. Vertebrae: C3-6 ACDF.  No acute abnormality. Cord: Myelomalacia at the C3-4 level, unchanged. Posterior Fossa, vertebral arteries, paraspinal tissues: Negative. Disc levels: C1-2: Unremarkable. C2-3: Mild facet hypertrophy with small disc bulge and uncinate spurring. There is no spinal canal stenosis. Bilateral neural  foraminal stenosis. C3-4: ACDF. Unchanged small disc bulge. Unchanged severe spinal  canal stenosis. No neural foraminal stenosis. C4-5: ACDF. Mild facet hypertrophy. There is no spinal canal stenosis. No neural foraminal stenosis. C5-6: ACDF with small disc bulge and uncinate spurring. Unchanged mild spinal canal stenosis. Unchanged moderate bilateral neural foraminal stenosis. C6-7: Small disc bulge. There is no spinal canal stenosis. No neural foraminal stenosis. C7-T1: Normal disc space and facet joints. There is no spinal canal stenosis. No neural foraminal stenosis. MRI THORACIC SPINE FINDINGS Alignment:  Physiologic. Vertebrae: No fracture, evidence of discitis, or bone lesion. Modic type 2 endplate signal changes at L4-5, unchanged. Cord:  Normal signal and morphology. Paraspinal and other soft tissues: Negative. Disc levels: There is mild multilevel degenerative disc disease, greatest at T9-10 where there is mild spinal canal stenosis. No stenosis at any other level. MRI LUMBAR SPINE FINDINGS Segmentation:  Standard. Alignment:  Physiologic. Vertebrae:  No fracture, evidence of discitis, or bone lesion. Conus medullaris and cauda equina: Conus extends to the T12 level. Conus and cauda equina appear normal. Paraspinal and other soft tissues: Negative Disc levels: L1-L2: Normal disc space and facet joints. No spinal canal stenosis. No neural foraminal stenosis. L2-L3: Small disc bulge, unchanged. Unchanged mild spinal canal stenosis. No neural foraminal stenosis. L3-L4: Unchanged small disc bulge. Mild spinal canal stenosis. No neural foraminal stenosis. L4-L5: Unchanged intermediate sized disc bulge, right asymmetric. No spinal canal stenosis. Unchanged mild right neural foraminal stenosis. L5-S1: Normal disc space and facet joints. No spinal canal stenosis. No neural foraminal stenosis. Visualized sacrum: Normal. IMPRESSION: 1. C3-6 ACDF with unchanged severe spinal canal stenosis at C3-4 and focal  myelomalacia. 2. Unchanged moderate bilateral C5-6 neural foraminal stenosis. 3. Mild multilevel thoracic degenerative disc disease with mild spinal canal stenosis at T9-10. 4. Unchanged mild spinal canal stenosis at L2-3, L3-4 and L4-5. 5. Unchanged mild right L4-5 neural foraminal stenosis. Electronically Signed   By: Ulyses Jarred M.D.   On: 01/23/2023 22:33   MR THORACIC SPINE WO CONTRAST  Result Date: 01/23/2023 CLINICAL DATA:  Back pain EXAM: MRI CERVICAL, THORACIC AND LUMBAR SPINE WITHOUT CONTRAST TECHNIQUE: Multiplanar and multiecho pulse sequences of the cervical spine, to include the craniocervical junction and cervicothoracic junction, and thoracic and lumbar spine, were obtained without intravenous contrast. COMPARISON:  09/18/2022 FINDINGS: MRI CERVICAL SPINE FINDINGS Alignment: Physiologic. Vertebrae: C3-6 ACDF.  No acute abnormality. Cord: Myelomalacia at the C3-4 level, unchanged. Posterior Fossa, vertebral arteries, paraspinal tissues: Negative. Disc levels: C1-2: Unremarkable. C2-3: Mild facet hypertrophy with small disc bulge and uncinate spurring. There is no spinal canal stenosis. Bilateral neural foraminal stenosis. C3-4: ACDF. Unchanged small disc bulge. Unchanged severe spinal canal stenosis. No neural foraminal stenosis. C4-5: ACDF. Mild facet hypertrophy. There is no spinal canal stenosis. No neural foraminal stenosis. C5-6: ACDF with small disc bulge and uncinate spurring. Unchanged mild spinal canal stenosis. Unchanged moderate bilateral neural foraminal stenosis. C6-7: Small disc bulge. There is no spinal canal stenosis. No neural foraminal stenosis. C7-T1: Normal disc space and facet joints. There is no spinal canal stenosis. No neural foraminal stenosis. MRI THORACIC SPINE FINDINGS Alignment:  Physiologic. Vertebrae: No fracture, evidence of discitis, or bone lesion. Modic type 2 endplate signal changes at L4-5, unchanged. Cord:  Normal signal and morphology. Paraspinal and other  soft tissues: Negative. Disc levels: There is mild multilevel degenerative disc disease, greatest at T9-10 where there is mild spinal canal stenosis. No stenosis at any other level. MRI LUMBAR SPINE FINDINGS Segmentation:  Standard. Alignment:  Physiologic. Vertebrae:  No fracture, evidence of discitis, or bone  lesion. Conus medullaris and cauda equina: Conus extends to the T12 level. Conus and cauda equina appear normal. Paraspinal and other soft tissues: Negative Disc levels: L1-L2: Normal disc space and facet joints. No spinal canal stenosis. No neural foraminal stenosis. L2-L3: Small disc bulge, unchanged. Unchanged mild spinal canal stenosis. No neural foraminal stenosis. L3-L4: Unchanged small disc bulge. Mild spinal canal stenosis. No neural foraminal stenosis. L4-L5: Unchanged intermediate sized disc bulge, right asymmetric. No spinal canal stenosis. Unchanged mild right neural foraminal stenosis. L5-S1: Normal disc space and facet joints. No spinal canal stenosis. No neural foraminal stenosis. Visualized sacrum: Normal. IMPRESSION: 1. C3-6 ACDF with unchanged severe spinal canal stenosis at C3-4 and focal myelomalacia. 2. Unchanged moderate bilateral C5-6 neural foraminal stenosis. 3. Mild multilevel thoracic degenerative disc disease with mild spinal canal stenosis at T9-10. 4. Unchanged mild spinal canal stenosis at L2-3, L3-4 and L4-5. 5. Unchanged mild right L4-5 neural foraminal stenosis. Electronically Signed   By: Ulyses Jarred M.D.   On: 01/23/2023 22:33   MR Cervical Spine Wo Contrast  Result Date: 01/23/2023 CLINICAL DATA:  Back pain EXAM: MRI CERVICAL, THORACIC AND LUMBAR SPINE WITHOUT CONTRAST TECHNIQUE: Multiplanar and multiecho pulse sequences of the cervical spine, to include the craniocervical junction and cervicothoracic junction, and thoracic and lumbar spine, were obtained without intravenous contrast. COMPARISON:  09/18/2022 FINDINGS: MRI CERVICAL SPINE FINDINGS Alignment:  Physiologic. Vertebrae: C3-6 ACDF.  No acute abnormality. Cord: Myelomalacia at the C3-4 level, unchanged. Posterior Fossa, vertebral arteries, paraspinal tissues: Negative. Disc levels: C1-2: Unremarkable. C2-3: Mild facet hypertrophy with small disc bulge and uncinate spurring. There is no spinal canal stenosis. Bilateral neural foraminal stenosis. C3-4: ACDF. Unchanged small disc bulge. Unchanged severe spinal canal stenosis. No neural foraminal stenosis. C4-5: ACDF. Mild facet hypertrophy. There is no spinal canal stenosis. No neural foraminal stenosis. C5-6: ACDF with small disc bulge and uncinate spurring. Unchanged mild spinal canal stenosis. Unchanged moderate bilateral neural foraminal stenosis. C6-7: Small disc bulge. There is no spinal canal stenosis. No neural foraminal stenosis. C7-T1: Normal disc space and facet joints. There is no spinal canal stenosis. No neural foraminal stenosis. MRI THORACIC SPINE FINDINGS Alignment:  Physiologic. Vertebrae: No fracture, evidence of discitis, or bone lesion. Modic type 2 endplate signal changes at L4-5, unchanged. Cord:  Normal signal and morphology. Paraspinal and other soft tissues: Negative. Disc levels: There is mild multilevel degenerative disc disease, greatest at T9-10 where there is mild spinal canal stenosis. No stenosis at any other level. MRI LUMBAR SPINE FINDINGS Segmentation:  Standard. Alignment:  Physiologic. Vertebrae:  No fracture, evidence of discitis, or bone lesion. Conus medullaris and cauda equina: Conus extends to the T12 level. Conus and cauda equina appear normal. Paraspinal and other soft tissues: Negative Disc levels: L1-L2: Normal disc space and facet joints. No spinal canal stenosis. No neural foraminal stenosis. L2-L3: Small disc bulge, unchanged. Unchanged mild spinal canal stenosis. No neural foraminal stenosis. L3-L4: Unchanged small disc bulge. Mild spinal canal stenosis. No neural foraminal stenosis. L4-L5: Unchanged intermediate  sized disc bulge, right asymmetric. No spinal canal stenosis. Unchanged mild right neural foraminal stenosis. L5-S1: Normal disc space and facet joints. No spinal canal stenosis. No neural foraminal stenosis. Visualized sacrum: Normal. IMPRESSION: 1. C3-6 ACDF with unchanged severe spinal canal stenosis at C3-4 and focal myelomalacia. 2. Unchanged moderate bilateral C5-6 neural foraminal stenosis. 3. Mild multilevel thoracic degenerative disc disease with mild spinal canal stenosis at T9-10. 4. Unchanged mild spinal canal stenosis at L2-3, L3-4 and L4-5. 5.  Unchanged mild right L4-5 neural foraminal stenosis. Electronically Signed   By: Ulyses Jarred M.D.   On: 01/23/2023 22:33   CT ABDOMEN PELVIS WO CONTRAST  Result Date: 01/23/2023 CLINICAL DATA:  Kidney failure, acute renal failure EXAM: CT ABDOMEN AND PELVIS WITHOUT CONTRAST TECHNIQUE: Multidetector CT imaging of the abdomen and pelvis was performed following the standard protocol without IV contrast. RADIATION DOSE REDUCTION: This exam was performed according to the departmental dose-optimization program which includes automated exposure control, adjustment of the mA and/or kV according to patient size and/or use of iterative reconstruction technique. COMPARISON:  CT 03/26/2018. FINDINGS: Lower chest: No acute abnormality. Hepatobiliary: No focal liver abnormality is seen. Decompressed gallbladder with cholelithiasis. Pancreas: Unremarkable. No pancreatic ductal dilatation or surrounding inflammatory changes. Spleen: Normal in size without focal abnormality. Adrenals/Urinary Tract: Adrenal glands are unremarkable. Moderate bilateral hydronephrosis. No nephroureterolithiasis. Mild perinephric stranding bilaterally. Markedly dilated urinary bladder. Stomach/Bowel: The stomach is within normal limits. There is no evidence of bowel obstruction.The appendix is normal. Vascular/Lymphatic: Aortoiliac atherosclerosis. No AAA. No lymphadenopathy. Retroaortic left  renal vein. Reproductive: Enlarged prostate gland. Bilateral scrotal varicoceles. Other: Small fat containing umbilical hernia. No abdominopelvic ascites. Musculoskeletal: No acute osseous abnormality. No suspicious osseous lesion. Prior left hip arthroplasty. Normal alignment. Moderate right hip osteoarthritis. Degenerative changes of the spine most prominent at L4-L5. IMPRESSION: Findings suggestive of bladder outlet obstruction due to an enlarged prostate gland. Markedly distended urinary bladder and moderate bilateral hydroureteronephrosis. No other acute findings in the abdomen or pelvis. Additional chronic findings as described above. Electronically Signed   By: Maurine Simmering M.D.   On: 01/23/2023 18:46   DG Hip Unilat With Pelvis 2-3 Views Left  Result Date: 01/23/2023 CLINICAL DATA:  Sepsis, surgery EXAM: DG HIP (WITH OR WITHOUT PELVIS) 3V LEFT COMPARISON:  08/14/2021. FINDINGS: Status post left total hip arthroplasty. No periprosthetic lucency to suggest loosening. No acute fracture, dislocation or subluxation. Pelvic ring intact. IMPRESSION: Unremarkable appearance status post left total hip arthroplasty. Electronically Signed   By: Sammie Bench M.D.   On: 01/23/2023 17:29   DG Chest 1 View  Result Date: 01/23/2023 CLINICAL DATA:  Recent surgery, sepsis EXAM: CHEST  1 VIEW COMPARISON:  None Available. FINDINGS: The heart is enlarged. The lungs are clear. There is no pleural effusion or pneumothorax. No acute fractures are seen. IMPRESSION: Cardiomegaly. No acute pulmonary process. Electronically Signed   By: Ronney Asters M.D.   On: 01/23/2023 17:29    Labs: BMET Recent Labs  Lab 01/23/23 1547 01/23/23 2001 01/23/23 2358 01/24/23 1652 01/25/23 0105  NA 129* 134* 136 139 139  K 5.1 4.9 5.6* 3.8 3.7  CL 102 104  --  109 109  CO2 18*  --   --  22 25  GLUCOSE 117* 104*  --  127* 120*  BUN 96* 92*  --  42* 29*  CREATININE 14.05* 12.40*  --  3.04* 2.17*  CALCIUM 8.2*  --   --  8.3* 8.1*    CBC Recent Labs  Lab 01/23/23 1547 01/23/23 2001 01/23/23 2345 01/23/23 2358 01/25/23 0105  WBC 14.2*  --  17.6*  --  12.8*  NEUTROABS 12.2*  --  15.3*  --  10.8*  HGB 8.4* 9.2* 7.6* 7.5* 8.0*  HCT 24.1* 27.0* 22.0* 22.0* 22.2*  MCV 94.9  --  97.3  --  93.3  PLT 116*  --  84*  --  114*    Medications:     Chlorhexidine Gluconate Cloth  6 each Topical Daily   feeding supplement  237 mL Oral BID BM   levothyroxine  200 mcg Oral QAC breakfast   multivitamin with minerals  1 tablet Oral Daily   tamsulosin  0.4 mg Oral Daily      Otelia Santee, MD 01/25/2023, 12:08 PM

## 2023-01-25 NOTE — Progress Notes (Signed)
OT Cancellation Note  Patient Details Name: Cable Fearn MRN: [id number] DOB: [birth year]   Cancelled Treatment:    Reason Eval/Treat Not Completed: Patient declined, no reason specified Pt reported just getting comfortable in chair and awaiting breakfast. Declined to participate in OT eval at this time. Will follow up as schedule permits.   Layla Maw 01/25/2023, 8:56 AM

## 2023-01-25 NOTE — Evaluation (Signed)
Occupational Therapy Evaluation Patient Details Name: Alex West MRN: [id number] DOB: [birth year] Today's Date: 01/25/2023   History of Present Illness 67yo male presenting with c/o fatigue, temperature up to 104, weakness, urinary incontinence, and inability to walk. Admitted with AKI, sepsis, lower UTI. 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 Since THA, pt has been ambulatory with RW and has assist from spouse for LB ADLs. Pt presents now fairly close to most recent baseline w/ min guard for mobility using RW. Pt with one noted LOB when turning with RW and required Min A to correct in hallway. Pt requires Min A for LB ADLs with improvements in independence noted after education on various AE for ADLs. Provided appropriate handouts/information for pt to obtain hip kit online. Anticipate no OT needs at DC but will follow acutely.   SpO2 99-100% on RA, HR 80s     Recommendations for follow up therapy are one component of a multi-disciplinary discharge planning process, led by the attending physician.  Recommendations may be updated based on patient status, additional functional criteria and insurance authorization.   Follow Up Recommendations  No OT follow up     Assistance Recommended at Discharge Intermittent Supervision/Assistance  Patient can return home with the following A little help with bathing/dressing/bathroom;Assistance with cooking/housework;Help with stairs or ramp for entrance    Functional Status Assessment  Patient has had a recent decline in their functional status and demonstrates the ability to make significant improvements in function in a reasonable and predictable amount of time.  Equipment Recommendations  None recommended by OT    Recommendations for Other Services       Precautions /  Restrictions Precautions Precautions: Fall Restrictions Weight Bearing Restrictions: No      Mobility Bed Mobility               General bed mobility comments: in chair on entry    Transfers Overall transfer level: Needs assistance Equipment used: Rolling walker (2 wheels) Transfers: Sit to/from Stand Sit to Stand: Min guard           General transfer comment: min guard w/ increased effort from very low recliner for pt tall stature. Placed pillow in chair in attempts to raise height for next standing      Balance Overall balance assessment: Needs assistance Sitting-balance support: Feet supported, No upper extremity supported Sitting balance-Leahy Scale: Good     Standing balance support: Reliant on assistive device for balance, During functional activity, Bilateral upper extremity supported Standing balance-Leahy Scale: Fair                             ADL either performed or assessed with clinical judgement   ADL Overall ADL's : Needs assistance/impaired Eating/Feeding: Independent   Grooming: Supervision/safety;Standing   Upper Body Bathing: Sitting;Modified independent   Lower Body Bathing: Minimal assistance;Sit to/from stand   Upper Body Dressing : Modified independent   Lower Body Dressing: Minimal assistance;With adaptive equipment;Sit to/from stand;Sitting/lateral leans Lower Body Dressing Details (indicate cue type and reason): Educated pt on AE for LB ADL including reacher, sock aid, dressing stick, and shoehorn. Pt able to return demo with Min A and increased time. Pt required increased assist to don L tennis shoe rather than R LE. Required assist to tie shoes. discussed  clothing mgmt w/ catheter as pt likely to DC home with this Toilet Transfer: Min guard;Ambulation;Rolling walker (2 wheels)   Toileting- Clothing Manipulation and Hygiene: Min guard;Sitting/lateral lean;Sit to/from stand       Functional mobility during ADLs: Min  guard;Rolling walker (2 wheels) General ADL Comments: Able to mobilize in hall though did require Min A to correct a LOB when turning to go back to room.     Vision Baseline Vision/History: 1 Wears glasses Ability to See in Adequate Light: 0 Adequate Patient Visual Report: No change from baseline Vision Assessment?: No apparent visual deficits     Perception     Praxis      Pertinent Vitals/Pain Pain Assessment Pain Assessment: Faces Faces Pain Scale: Hurts little more Pain Location: left hip Pain Descriptors / Indicators: Sore, Other (Comment) (stiff) Pain Intervention(s): Monitored during session     Hand Dominance Right   Extremity/Trunk Assessment Upper Extremity Assessment Upper Extremity Assessment: Overall WFL for tasks assessed   Lower Extremity Assessment Lower Extremity Assessment: Defer to PT evaluation   Cervical / Trunk Assessment Cervical / Trunk Assessment: Normal   Communication Communication Communication: No difficulties   Cognition Arousal/Alertness: Awake/alert Behavior During Therapy: WFL for tasks assessed/performed Overall Cognitive Status: Within Functional Limits for tasks assessed                                       General Comments  Wife present and supportive    Exercises     Shoulder Instructions      Home Living Family/patient expects to be discharged to:: Private residence Living Arrangements: Spouse/significant other Available Help at Discharge: Family;Available 24 hours/day Type of Home: House Home Access: Stairs to enter [employer] of Steps: 3 Entrance Stairs-Rails: Left Home Layout: Multi-level;Able to live on main level with bedroom/bathroom     Bathroom Shower/Tub: Hospital doctor Toilet: Handicapped height     Home Equipment: Conservation officer, nature (2 wheels);Cane - single point;Toilet riser;BSC/3in1;Hand held shower head          Prior Functioning/Environment Prior Level of  Function : Independent/Modified Independent;Driving             Mobility Comments: SPC ADLs Comments: IND, 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 since THA, wife has been assisting some with LB ADL        OT Problem List: Decreased strength;Decreased activity tolerance;Impaired balance (sitting and/or standing);Decreased knowledge of use of DME or AE;Pain      OT Treatment/Interventions: Self-care/ADL training;Therapeutic exercise;Energy conservation;DME and/or AE instruction;Therapeutic activities;Patient/family education    OT Goals(Current goals can be found in the care plan section) Acute Rehab OT Goals Patient Stated Goal: home soon, recover from sx OT Goal Formulation: With patient Time For Goal Achievement: 02/08/23 Potential to Achieve Goals: Good  OT Frequency: Min 2X/week    Co-evaluation              AM-PAC OT "6 Clicks" Daily Activity     Outcome Measure Help from another person eating meals?: None Help from another person taking care of personal grooming?: A Little Help from another person toileting, which includes using toliet, bedpan, or urinal?: A Little Help from another person bathing (including washing, rinsing, drying)?: A Little Help from another person to put on and taking off regular upper body clothing?: None Help from another person to put on and taking off regular  lower body clothing?: A Little 6 Click Score: 20   End of Session Equipment Utilized During Treatment: Rolling walker (2 wheels) Nurse Communication: Mobility status  Activity Tolerance: Patient tolerated treatment well Patient left: in chair;with call bell/phone within reach;with family/visitor present  OT Visit Diagnosis: Unsteadiness on feet (R26.81);Other abnormalities of gait and mobility (R26.89)                Time: 7289-7915 OT Time Calculation (min): 47 min Charges:  OT General Charges $OT Visit: 1 Visit OT Evaluation $OT Eval Moderate Complexity: 1 Mod OT  Treatments $Therapeutic Activity: 8-22 mins  Malachy Chamber, OTR/L Acute Rehab Services Office: [phone]   Alex West 01/25/2023, 1:11 PM

## 2023-01-25 NOTE — Progress Notes (Signed)
PROGRESS NOTE    Alex West  HYQ:[id number] DOB: 07/05/1956 DOA: 01/23/2023 PCP: Lawerance Cruel, MD    Brief Narrative:  Patient with history of autoimmune hepatitis, anemia, hypothyroidism, BPH who presented to emergency room with fatigue, fever 104 at home, generalized weakness for at least 1 week.  Cervical spine fusion 6 months ago, hip replacement 2 weeks ago. Patient is on Imuran.  He also reported urinary incontinence.  Does have history of BPH. In the emergency room temperature 101.7, blood pressure 82/58, WC count 17K, BUN/creatinine 96/14 with recent normal creatinine levels.  MRI cervical, thoracic and lumbar spine with severe chronic stenosis, no evidence of infection. CT scan abdomen pelvis without contrast with distended urinary bladder and hydronephrosis. Blood cultures positive for Klebsiella.  Catheter was placed.   Assessment & Plan:   Severe sepsis present on admission secondary to Klebsiella bacteremia, Klebsiella UTI. Acute on chronic urine retention secondary to BPH.  Clinically improving. Foley catheter to decompress bladder and will continue until outpatient urology follow-up.  Patient does have a urologist as outpatient. Patient on Rocephin that will be continued until final cultures.  Acute severe renal failure: Secondary to BPH and urinary retention.  Foley catheter was successfully placed.  Dramatically improving renal functions.  Sodium is stable.  Urine output 7 L since admission.   Encourage oral intake.  Will decrease Ringer lactate to 50 mill per hour today.  Recheck levels tomorrow morning.   Followed by nephrology.  Anticipating complete recovery.   Cervical myelopathy: Chronic issue.  Currently without evidence of acute infection.  Continue to work with PT OT.  Will send home with home health.  Chronic auto immune hepatitis: On Imuran therapy.  Hold until infection clears up.  Hypothyroidism: On Synthroid.  Continue to mobilize.   Anticipate home tomorrow if renal functions further improvement and culture reports are available with home health therapies. Do not remove Foley catheter.  Plan to discharge with Foley.   DVT prophylaxis: SCDs Start: 01/24/23 9528   Code Status: Full code Family Communication: None at the bedside Disposition Plan: Status is: Inpatient Remains inpatient appropriate because: Significant sepsis and acute renal failure     Consultants:  Nephrology  Procedures:  None  Antimicrobials:  Rocephin 2/1---   Subjective:  Patient seen and examined.  Denies any complaints today.  Urine is clearing up.  Feels overall much better.  He did not need any support to get out of the bed to the bathroom.  Objective: Vitals:   01/24/23 2053 01/24/23 2329 01/25/23 0411 01/25/23 0844  BP:  132/83 (!) 139/95 113/76  Pulse:  98 83 71  Resp:  '20 12 18  '$ Temp: 99.8 F (37.7 C) 100 F (37.8 C) 98.9 F (37.2 C) 99.4 F (37.4 C)  TempSrc: Axillary Oral Oral Oral  SpO2:  100% 100% 94%  Weight:      Height:        Intake/Output Summary (Last 24 hours) at 01/25/2023 1110 Last data filed at 01/25/2023 0830 Gross per 24 hour  Intake 480 ml  Output 4425 ml  Net -3945 ml   Filed Weights   01/23/23 2333  Weight: 108 kg    Examination:  General exam: Appears calm and comfortable.  Not in distress.  On room air. Respiratory system: No added sounds. Cardiovascular system: S1 & S2 heard, RRR. No pedal edema. Gastrointestinal system: Abdomen is nondistended, soft and nontender. No organomegaly or masses felt. Normal bowel sounds heard. Foley catheter with pink-tinged  urine. Central nervous system: Alert and oriented. No focal neurological deficits. Extremities: Symmetric 5 x 5 power. Skin: No rashes, lesions or ulcers Psychiatry: Judgement and insight appear normal. Mood & affect appropriate.     Data Reviewed: I have personally reviewed following labs and imaging studies  CBC: Recent Labs   Lab 01/23/23 1547 01/23/23 2001 01/23/23 2345 01/23/23 2358 01/25/23 0105  WBC 14.2*  --  17.6*  --  12.8*  NEUTROABS 12.2*  --  15.3*  --  10.8*  HGB 8.4* 9.2* 7.6* 7.5* 8.0*  HCT 24.1* 27.0* 22.0* 22.0* 22.2*  MCV 94.9  --  97.3  --  93.3  PLT 116*  --  84*  --  211*   Basic Metabolic Panel: Recent Labs  Lab 01/23/23 1547 01/23/23 2001 01/23/23 2358 01/24/23 1652 01/25/23 0105  NA 129* 134* 136 139 139  K 5.1 4.9 5.6* 3.8 3.7  CL 102 104  --  109 109  CO2 18*  --   --  22 25  GLUCOSE 117* 104*  --  127* 120*  BUN 96* 92*  --  42* 29*  CREATININE 14.05* 12.40*  --  3.04* 2.17*  CALCIUM 8.2*  --   --  8.3* 8.1*  MG  --   --   --   --  1.8   GFR: Estimated Creatinine Clearance: 45.7 mL/min (A) (by C-G formula based on SCr of 2.17 mg/dL (H)). Liver Function Tests: Recent Labs  Lab 01/23/23 1547 01/25/23 0105  AST 25 36  ALT 12 20  ALKPHOS 45 47  BILITOT 0.8 0.9  PROT 6.2* 5.6*  ALBUMIN 2.9* 2.5*   No results for input(s): "LIPASE", "AMYLASE" in the last 168 hours. Recent Labs  Lab 01/23/23 2345  AMMONIA 28   Coagulation Profile: Recent Labs  Lab 01/23/23 1547  INR 1.8*   Cardiac Enzymes: Recent Labs  Lab 01/23/23 2345 01/24/23 0419  CKTOTAL 575* 459*   BNP (last 3 results) No results for input(s): "PROBNP" in the last 8760 hours. HbA1C: No results for input(s): "HGBA1C" in the last 72 hours. CBG: No results for input(s): "GLUCAP" in the last 168 hours. Lipid Profile: No results for input(s): "CHOL", "HDL", "LDLCALC", "TRIG", "CHOLHDL", "LDLDIRECT" in the last 72 hours. Thyroid Function Tests: Recent Labs    01/23/23 2345  TSH 4.894*   Anemia Panel: Recent Labs    01/23/23 2345  VITAMINB12 416  FOLATE 22.4  FERRITIN 784*  TIBC 182*  IRON 25*  RETICCTPCT 2.5   Sepsis Labs: Recent Labs  Lab 01/23/23 1543 01/23/23 1954 01/23/23 2345 01/24/23 0419  PROCALCITON  --   --  33.97  --   LATICACIDVEN 1.5 2.1*  --  1.6    Recent  Results (from the past 240 hour(s))  Resp panel by RT-PCR (RSV, Flu A&B, Covid) Anterior Nasal Swab     Status: None   Collection Time: 01/23/23  3:18 PM   Specimen: Anterior Nasal Swab  Result Value Ref Range Status   SARS Coronavirus 2 by RT PCR NEGATIVE NEGATIVE Final   Influenza A by PCR NEGATIVE NEGATIVE Final   Influenza B by PCR NEGATIVE NEGATIVE Final    Comment: (NOTE) The Xpert Xpress SARS-CoV-2/FLU/RSV plus assay is intended as an aid in the diagnosis of influenza from Nasopharyngeal swab specimens and should not be used as a sole basis for treatment. Nasal washings and aspirates are unacceptable for Xpert Xpress SARS-CoV-2/FLU/RSV testing.  Fact Sheet for Patients: EntrepreneurPulse.com.au  Fact  Sheet for Healthcare Providers: IncredibleEmployment.be  This test is not yet approved or cleared by the [place] and has been authorized for detection and/or diagnosis of SARS-CoV-2 by FDA under an Emergency Use Authorization (EUA). This EUA will remain in effect (meaning this test can be used) for the duration of the COVID-19 declaration under Section 564(b)(1) of the Act, 21 U.S.C. section 360bbb-3(b)(1), unless the authorization is terminated or revoked.     Resp Syncytial Virus by PCR NEGATIVE NEGATIVE Final    Comment: (NOTE) Fact Sheet for Patients: EntrepreneurPulse.com.au  Fact Sheet for Healthcare Providers: IncredibleEmployment.be  This test is not yet approved or cleared by the [place] FDA and has been authorized for detection and/or diagnosis of SARS-CoV-2 by FDA under an Emergency Use Authorization (EUA). This EUA will remain in effect (meaning this test can be used) for the duration of the COVID-19 declaration under Section 564(b)(1) of the Act, 21 U.S.C. section 360bbb-3(b)(1), unless the authorization is terminated or revoked.  Performed at [facility],  [place] [address], [place], [place] [address]   Blood Culture (routine x 2)     Status: Abnormal (Preliminary result)   Collection Time: 01/23/23  3:43 PM   Specimen: BLOOD  Result Value Ref Range Status   Specimen Description BLOOD RIGHT ANTECUBITAL  Final   Special Requests   Final    BOTTLES DRAWN AEROBIC AND ANAEROBIC Blood Culture adequate volume   Culture  Setup Time   Final    GRAM NEGATIVE RODS IN BOTH AEROBIC AND ANAEROBIC BOTTLES CRITICAL RESULT CALLED TO, READ BACK BY AND VERIFIED WITH: T RUDISILL,PHARMD'@0537'$  01/24/23 [place]    Culture (A)  Final    KLEBSIELLA PNEUMONIAE SUSCEPTIBILITIES TO FOLLOW Performed at [facility], [place] [address] [place], [place] [address]    Report Status PENDING  Incomplete  Blood Culture ID Panel (Reflexed)     Status: Abnormal   Collection Time: 01/23/23  3:43 PM  Result Value Ref Range Status   Enterococcus faecalis NOT DETECTED NOT DETECTED Final   Enterococcus Faecium NOT DETECTED NOT DETECTED Final   Listeria monocytogenes NOT DETECTED NOT DETECTED Final   Staphylococcus species NOT DETECTED NOT DETECTED Final   Staphylococcus aureus (BCID) NOT DETECTED NOT DETECTED Final   Staphylococcus epidermidis NOT DETECTED NOT DETECTED Final   Staphylococcus lugdunensis NOT DETECTED NOT DETECTED Final   Streptococcus species NOT DETECTED NOT DETECTED Final   Streptococcus agalactiae NOT DETECTED NOT DETECTED Final   Streptococcus pneumoniae NOT DETECTED NOT DETECTED Final   Streptococcus pyogenes NOT DETECTED NOT DETECTED Final   A.calcoaceticus-baumannii NOT DETECTED NOT DETECTED Final   Bacteroides fragilis NOT DETECTED NOT DETECTED Final   Enterobacterales DETECTED (A) NOT DETECTED Final    Comment: Enterobacterales represent a large order of gram negative bacteria, not a single organism. CRITICAL RESULT CALLED TO, READ BACK BY AND VERIFIED WITH: T RUDISILL,PHARMD'@0538'$  01/24/23 [place]    Enterobacter cloacae complex NOT DETECTED NOT DETECTED  Final   Escherichia coli NOT DETECTED NOT DETECTED Final   Klebsiella aerogenes NOT DETECTED NOT DETECTED Final   Klebsiella oxytoca NOT DETECTED NOT DETECTED Final   Klebsiella pneumoniae DETECTED (A) NOT DETECTED Final    Comment: CRITICAL RESULT CALLED TO, READ BACK BY AND VERIFIED WITH: T RUDISILL,PHARMD'@0538'$  01/24/23 [place]    Proteus species NOT DETECTED NOT DETECTED Final   Salmonella species NOT DETECTED NOT DETECTED Final   Serratia marcescens NOT DETECTED NOT DETECTED Final   Haemophilus influenzae NOT DETECTED NOT  DETECTED Final   Neisseria meningitidis NOT DETECTED NOT DETECTED Final   Pseudomonas aeruginosa NOT DETECTED NOT DETECTED Final   Stenotrophomonas maltophilia NOT DETECTED NOT DETECTED Final   Candida albicans NOT DETECTED NOT DETECTED Final   Candida auris NOT DETECTED NOT DETECTED Final   Candida glabrata NOT DETECTED NOT DETECTED Final   Candida krusei NOT DETECTED NOT DETECTED Final   Candida parapsilosis NOT DETECTED NOT DETECTED Final   Candida tropicalis NOT DETECTED NOT DETECTED Final   Cryptococcus neoformans/gattii NOT DETECTED NOT DETECTED Final   CTX-M ESBL NOT DETECTED NOT DETECTED Final   Carbapenem resistance IMP NOT DETECTED NOT DETECTED Final   Carbapenem resistance KPC NOT DETECTED NOT DETECTED Final   Carbapenem resistance NDM NOT DETECTED NOT DETECTED Final   Carbapenem resist OXA 48 LIKE NOT DETECTED NOT DETECTED Final   Carbapenem resistance VIM NOT DETECTED NOT DETECTED Final    Comment: Performed at [facility], [address] [place], [place] [address]  Blood Culture (routine x 2)     Status: None (Preliminary result)   Collection Time: 01/23/23  3:49 PM   Specimen: BLOOD  Result Value Ref Range Status   Specimen Description BLOOD BLOOD RIGHT WRIST  Final   Special Requests   Final    BOTTLES DRAWN AEROBIC AND ANAEROBIC Blood Culture adequate volume   Culture  Setup Time   Final    GRAM NEGATIVE RODS IN BOTH AEROBIC AND  ANAEROBIC BOTTLES CRITICAL VALUE NOTED.  VALUE IS CONSISTENT WITH PREVIOUSLY REPORTED AND CALLED VALUE.    Culture   Final    GRAM NEGATIVE RODS IDENTIFICATION TO FOLLOW Performed at [facility], [place] [address] [place], [place] [address]    Report Status PENDING  Incomplete  Urine Culture     Status: Abnormal   Collection Time: 01/23/23  6:40 PM   Specimen: Urine, Random  Result Value Ref Range Status   Specimen Description URINE, RANDOM  Final   Special Requests   Final    NONE Reflexed from [id number] Performed at [facility], [place] [address] [place], [place] [address]    Culture >=100,000 COLONIES/mL KLEBSIELLA PNEUMONIAE (A)  Final   Report Status 01/25/2023 FINAL  Final   Organism ID, Bacteria KLEBSIELLA PNEUMONIAE (A)  Final      Susceptibility   Klebsiella pneumoniae - MIC*    AMPICILLIN RESISTANT Resistant     CEFAZOLIN <=4 SENSITIVE Sensitive     CEFEPIME <=0.12 SENSITIVE Sensitive     CEFTRIAXONE <=0.25 SENSITIVE Sensitive     CIPROFLOXACIN <=0.25 SENSITIVE Sensitive     GENTAMICIN <=1 SENSITIVE Sensitive     IMIPENEM <=0.25 SENSITIVE Sensitive     NITROFURANTOIN 64 INTERMEDIATE Intermediate     TRIMETH/SULFA <=20 SENSITIVE Sensitive     AMPICILLIN/SULBACTAM <=2 SENSITIVE Sensitive     PIP/TAZO <=4 SENSITIVE Sensitive     * >=100,000 COLONIES/mL KLEBSIELLA PNEUMONIAE         Radiology Studies: VAS [place] LOWER EXTREMITY VENOUS (DVT) (7a-7p)  Result Date: 01/24/2023  Lower Venous DVT Study Patient Name:  CASHMERE HARMES  Date of Exam:   01/23/2023 Medical Rec #: [id number]             Accession #:    [id number] Date of Birth: Jun 16, 1956             Patient Gender: M Patient Age:   55 years Exam Location:  [facility] Procedure:  VAS [place] LOWER EXTREMITY VENOUS (DVT) Referring Phys: Garnette Gunner --------------------------------------------------------------------------------  Indications: Edema.  Risk Factors: Surgery. Limitations: Poor  ultrasound/tissue interface. Comparison Study: No prior studies. Performing Technologist: Oliver Hum RVT  Examination Guidelines: A complete evaluation includes B-mode imaging, spectral Doppler, color Doppler, and power Doppler as needed of all accessible portions of each vessel. Bilateral testing is considered an integral part of a complete examination. Limited examinations for reoccurring indications may be performed as noted. The reflux portion of the exam is performed with the patient in reverse Trendelenburg.  +-----+---------------+---------+-----------+----------+--------------+ RIGHTCompressibilityPhasicitySpontaneityPropertiesThrombus Aging +-----+---------------+---------+-----------+----------+--------------+ CFV  Full           Yes      Yes                                 +-----+---------------+---------+-----------+----------+--------------+   +---------+---------------+---------+-----------+----------+--------------+ LEFT     CompressibilityPhasicitySpontaneityPropertiesThrombus Aging +---------+---------------+---------+-----------+----------+--------------+ CFV      Full           Yes      Yes                                 +---------+---------------+---------+-----------+----------+--------------+ SFJ      Full                                                        +---------+---------------+---------+-----------+----------+--------------+ FV Prox  Full                                                        +---------+---------------+---------+-----------+----------+--------------+ FV Mid   Full           Yes      Yes                                 +---------+---------------+---------+-----------+----------+--------------+ FV DistalFull                                                        +---------+---------------+---------+-----------+----------+--------------+ PFV      Full                                                         +---------+---------------+---------+-----------+----------+--------------+ POP      Full           Yes      Yes                                 +---------+---------------+---------+-----------+----------+--------------+ PTV      Full                                                        +---------+---------------+---------+-----------+----------+--------------+  PERO     Full                                                        +---------+---------------+---------+-----------+----------+--------------+     Summary: RIGHT: - No evidence of common femoral vein obstruction.  LEFT: - There is no evidence of deep vein thrombosis in the lower extremity.  - No cystic structure found in the popliteal fossa.  *See table(s) above for measurements and observations. Electronically signed by Jamelle Haring on 01/24/2023 at 7:31:58 AM.    Final    MR LUMBAR SPINE WO CONTRAST  Result Date: 01/23/2023 CLINICAL DATA:  Back pain EXAM: MRI CERVICAL, THORACIC AND LUMBAR SPINE WITHOUT CONTRAST TECHNIQUE: Multiplanar and multiecho pulse sequences of the cervical spine, to include the craniocervical junction and cervicothoracic junction, and thoracic and lumbar spine, were obtained without intravenous contrast. COMPARISON:  09/18/2022 FINDINGS: MRI CERVICAL SPINE FINDINGS Alignment: Physiologic. Vertebrae: C3-6 ACDF.  No acute abnormality. Cord: Myelomalacia at the C3-4 level, unchanged. Posterior Fossa, vertebral arteries, paraspinal tissues: Negative. Disc levels: C1-2: Unremarkable. C2-3: Mild facet hypertrophy with small disc bulge and uncinate spurring. There is no spinal canal stenosis. Bilateral neural foraminal stenosis. C3-4: ACDF. Unchanged small disc bulge. Unchanged severe spinal canal stenosis. No neural foraminal stenosis. C4-5: ACDF. Mild facet hypertrophy. There is no spinal canal stenosis. No neural foraminal stenosis. C5-6: ACDF with small disc bulge and uncinate spurring. Unchanged mild spinal  canal stenosis. Unchanged moderate bilateral neural foraminal stenosis. C6-7: Small disc bulge. There is no spinal canal stenosis. No neural foraminal stenosis. C7-T1: Normal disc space and facet joints. There is no spinal canal stenosis. No neural foraminal stenosis. MRI THORACIC SPINE FINDINGS Alignment:  Physiologic. Vertebrae: No fracture, evidence of discitis, or bone lesion. Modic type 2 endplate signal changes at L4-5, unchanged. Cord:  Normal signal and morphology. Paraspinal and other soft tissues: Negative. Disc levels: There is mild multilevel degenerative disc disease, greatest at T9-10 where there is mild spinal canal stenosis. No stenosis at any other level. MRI LUMBAR SPINE FINDINGS Segmentation:  Standard. Alignment:  Physiologic. Vertebrae:  No fracture, evidence of discitis, or bone lesion. Conus medullaris and cauda equina: Conus extends to the T12 level. Conus and cauda equina appear normal. Paraspinal and other soft tissues: Negative Disc levels: L1-L2: Normal disc space and facet joints. No spinal canal stenosis. No neural foraminal stenosis. L2-L3: Small disc bulge, unchanged. Unchanged mild spinal canal stenosis. No neural foraminal stenosis. L3-L4: Unchanged small disc bulge. Mild spinal canal stenosis. No neural foraminal stenosis. L4-L5: Unchanged intermediate sized disc bulge, right asymmetric. No spinal canal stenosis. Unchanged mild right neural foraminal stenosis. L5-S1: Normal disc space and facet joints. No spinal canal stenosis. No neural foraminal stenosis. Visualized sacrum: Normal. IMPRESSION: 1. C3-6 ACDF with unchanged severe spinal canal stenosis at C3-4 and focal myelomalacia. 2. Unchanged moderate bilateral C5-6 neural foraminal stenosis. 3. Mild multilevel thoracic degenerative disc disease with mild spinal canal stenosis at T9-10. 4. Unchanged mild spinal canal stenosis at L2-3, L3-4 and L4-5. 5. Unchanged mild right L4-5 neural foraminal stenosis. Electronically Signed    By: Ulyses Jarred M.D.   On: 01/23/2023 22:33   MR THORACIC SPINE WO CONTRAST  Result Date: 01/23/2023 CLINICAL DATA:  Back pain EXAM: MRI CERVICAL, THORACIC AND LUMBAR SPINE WITHOUT CONTRAST TECHNIQUE: Multiplanar and multiecho  pulse sequences of the cervical spine, to include the craniocervical junction and cervicothoracic junction, and thoracic and lumbar spine, were obtained without intravenous contrast. COMPARISON:  09/18/2022 FINDINGS: MRI CERVICAL SPINE FINDINGS Alignment: Physiologic. Vertebrae: C3-6 ACDF.  No acute abnormality. Cord: Myelomalacia at the C3-4 level, unchanged. Posterior Fossa, vertebral arteries, paraspinal tissues: Negative. Disc levels: C1-2: Unremarkable. C2-3: Mild facet hypertrophy with small disc bulge and uncinate spurring. There is no spinal canal stenosis. Bilateral neural foraminal stenosis. C3-4: ACDF. Unchanged small disc bulge. Unchanged severe spinal canal stenosis. No neural foraminal stenosis. C4-5: ACDF. Mild facet hypertrophy. There is no spinal canal stenosis. No neural foraminal stenosis. C5-6: ACDF with small disc bulge and uncinate spurring. Unchanged mild spinal canal stenosis. Unchanged moderate bilateral neural foraminal stenosis. C6-7: Small disc bulge. There is no spinal canal stenosis. No neural foraminal stenosis. C7-T1: Normal disc space and facet joints. There is no spinal canal stenosis. No neural foraminal stenosis. MRI THORACIC SPINE FINDINGS Alignment:  Physiologic. Vertebrae: No fracture, evidence of discitis, or bone lesion. Modic type 2 endplate signal changes at L4-5, unchanged. Cord:  Normal signal and morphology. Paraspinal and other soft tissues: Negative. Disc levels: There is mild multilevel degenerative disc disease, greatest at T9-10 where there is mild spinal canal stenosis. No stenosis at any other level. MRI LUMBAR SPINE FINDINGS Segmentation:  Standard. Alignment:  Physiologic. Vertebrae:  No fracture, evidence of discitis, or bone lesion.  Conus medullaris and cauda equina: Conus extends to the T12 level. Conus and cauda equina appear normal. Paraspinal and other soft tissues: Negative Disc levels: L1-L2: Normal disc space and facet joints. No spinal canal stenosis. No neural foraminal stenosis. L2-L3: Small disc bulge, unchanged. Unchanged mild spinal canal stenosis. No neural foraminal stenosis. L3-L4: Unchanged small disc bulge. Mild spinal canal stenosis. No neural foraminal stenosis. L4-L5: Unchanged intermediate sized disc bulge, right asymmetric. No spinal canal stenosis. Unchanged mild right neural foraminal stenosis. L5-S1: Normal disc space and facet joints. No spinal canal stenosis. No neural foraminal stenosis. Visualized sacrum: Normal. IMPRESSION: 1. C3-6 ACDF with unchanged severe spinal canal stenosis at C3-4 and focal myelomalacia. 2. Unchanged moderate bilateral C5-6 neural foraminal stenosis. 3. Mild multilevel thoracic degenerative disc disease with mild spinal canal stenosis at T9-10. 4. Unchanged mild spinal canal stenosis at L2-3, L3-4 and L4-5. 5. Unchanged mild right L4-5 neural foraminal stenosis. Electronically Signed   By: Ulyses Jarred M.D.   On: 01/23/2023 22:33   MR Cervical Spine Wo Contrast  Result Date: 01/23/2023 CLINICAL DATA:  Back pain EXAM: MRI CERVICAL, THORACIC AND LUMBAR SPINE WITHOUT CONTRAST TECHNIQUE: Multiplanar and multiecho pulse sequences of the cervical spine, to include the craniocervical junction and cervicothoracic junction, and thoracic and lumbar spine, were obtained without intravenous contrast. COMPARISON:  09/18/2022 FINDINGS: MRI CERVICAL SPINE FINDINGS Alignment: Physiologic. Vertebrae: C3-6 ACDF.  No acute abnormality. Cord: Myelomalacia at the C3-4 level, unchanged. Posterior Fossa, vertebral arteries, paraspinal tissues: Negative. Disc levels: C1-2: Unremarkable. C2-3: Mild facet hypertrophy with small disc bulge and uncinate spurring. There is no spinal canal stenosis. Bilateral neural  foraminal stenosis. C3-4: ACDF. Unchanged small disc bulge. Unchanged severe spinal canal stenosis. No neural foraminal stenosis. C4-5: ACDF. Mild facet hypertrophy. There is no spinal canal stenosis. No neural foraminal stenosis. C5-6: ACDF with small disc bulge and uncinate spurring. Unchanged mild spinal canal stenosis. Unchanged moderate bilateral neural foraminal stenosis. C6-7: Small disc bulge. There is no spinal canal stenosis. No neural foraminal stenosis. C7-T1: Normal disc space and facet joints. There is  no spinal canal stenosis. No neural foraminal stenosis. MRI THORACIC SPINE FINDINGS Alignment:  Physiologic. Vertebrae: No fracture, evidence of discitis, or bone lesion. Modic type 2 endplate signal changes at L4-5, unchanged. Cord:  Normal signal and morphology. Paraspinal and other soft tissues: Negative. Disc levels: There is mild multilevel degenerative disc disease, greatest at T9-10 where there is mild spinal canal stenosis. No stenosis at any other level. MRI LUMBAR SPINE FINDINGS Segmentation:  Standard. Alignment:  Physiologic. Vertebrae:  No fracture, evidence of discitis, or bone lesion. Conus medullaris and cauda equina: Conus extends to the T12 level. Conus and cauda equina appear normal. Paraspinal and other soft tissues: Negative Disc levels: L1-L2: Normal disc space and facet joints. No spinal canal stenosis. No neural foraminal stenosis. L2-L3: Small disc bulge, unchanged. Unchanged mild spinal canal stenosis. No neural foraminal stenosis. L3-L4: Unchanged small disc bulge. Mild spinal canal stenosis. No neural foraminal stenosis. L4-L5: Unchanged intermediate sized disc bulge, right asymmetric. No spinal canal stenosis. Unchanged mild right neural foraminal stenosis. L5-S1: Normal disc space and facet joints. No spinal canal stenosis. No neural foraminal stenosis. Visualized sacrum: Normal. IMPRESSION: 1. C3-6 ACDF with unchanged severe spinal canal stenosis at C3-4 and focal  myelomalacia. 2. Unchanged moderate bilateral C5-6 neural foraminal stenosis. 3. Mild multilevel thoracic degenerative disc disease with mild spinal canal stenosis at T9-10. 4. Unchanged mild spinal canal stenosis at L2-3, L3-4 and L4-5. 5. Unchanged mild right L4-5 neural foraminal stenosis. Electronically Signed   By: Ulyses Jarred M.D.   On: 01/23/2023 22:33   CT ABDOMEN PELVIS WO CONTRAST  Result Date: 01/23/2023 CLINICAL DATA:  Kidney failure, acute renal failure EXAM: CT ABDOMEN AND PELVIS WITHOUT CONTRAST TECHNIQUE: Multidetector CT imaging of the abdomen and pelvis was performed following the standard protocol without IV contrast. RADIATION DOSE REDUCTION: This exam was performed according to the departmental dose-optimization program which includes automated exposure control, adjustment of the mA and/or kV according to patient size and/or use of iterative reconstruction technique. COMPARISON:  CT 03/26/2018. FINDINGS: Lower chest: No acute abnormality. Hepatobiliary: No focal liver abnormality is seen. Decompressed gallbladder with cholelithiasis. Pancreas: Unremarkable. No pancreatic ductal dilatation or surrounding inflammatory changes. Spleen: Normal in size without focal abnormality. Adrenals/Urinary Tract: Adrenal glands are unremarkable. Moderate bilateral hydronephrosis. No nephroureterolithiasis. Mild perinephric stranding bilaterally. Markedly dilated urinary bladder. Stomach/Bowel: The stomach is within normal limits. There is no evidence of bowel obstruction.The appendix is normal. Vascular/Lymphatic: Aortoiliac atherosclerosis. No AAA. No lymphadenopathy. Retroaortic left renal vein. Reproductive: Enlarged prostate gland. Bilateral scrotal varicoceles. Other: Small fat containing umbilical hernia. No abdominopelvic ascites. Musculoskeletal: No acute osseous abnormality. No suspicious osseous lesion. Prior left hip arthroplasty. Normal alignment. Moderate right hip osteoarthritis. Degenerative  changes of the spine most prominent at L4-L5. IMPRESSION: Findings suggestive of bladder outlet obstruction due to an enlarged prostate gland. Markedly distended urinary bladder and moderate bilateral hydroureteronephrosis. No other acute findings in the abdomen or pelvis. Additional chronic findings as described above. Electronically Signed   By: Maurine Simmering M.D.   On: 01/23/2023 18:46   DG Hip Unilat With Pelvis 2-3 Views Left  Result Date: 01/23/2023 CLINICAL DATA:  Sepsis, surgery EXAM: DG HIP (WITH OR WITHOUT PELVIS) 3V LEFT COMPARISON:  08/14/2021. FINDINGS: Status post left total hip arthroplasty. No periprosthetic lucency to suggest loosening. No acute fracture, dislocation or subluxation. Pelvic ring intact. IMPRESSION: Unremarkable appearance status post left total hip arthroplasty. Electronically Signed   By: Sammie Bench M.D.   On: 01/23/2023 17:29  DG Chest 1 View  Result Date: 01/23/2023 CLINICAL DATA:  Recent surgery, sepsis EXAM: CHEST  1 VIEW COMPARISON:  None Available. FINDINGS: The heart is enlarged. The lungs are clear. There is no pleural effusion or pneumothorax. No acute fractures are seen. IMPRESSION: Cardiomegaly. No acute pulmonary process. Electronically Signed   By: Ronney Asters M.D.   On: 01/23/2023 17:29        Scheduled Meds:  Chlorhexidine Gluconate Cloth  6 each Topical Daily   feeding supplement  237 mL Oral BID BM   levothyroxine  200 mcg Oral QAC breakfast   multivitamin with minerals  1 tablet Oral Daily   tamsulosin  0.4 mg Oral Daily   Continuous Infusions:  cefTRIAXone (ROCEPHIN)  IV 2 g (01/24/23 1529)   lactated ringers 50 mL/hr at 01/25/23 0850     LOS: 2 days    Time spent: 35 minutes.    Barb Merino, MD [facility] Pager [phone]

## 2023-01-26 DIAGNOSIS — N179 Acute kidney failure, unspecified: Secondary | ICD-10-CM | POA: Diagnosis not present

## 2023-01-26 LAB — CBC WITH DIFFERENTIAL/PLATELET
Abs Immature Granulocytes: 0.2 10*3/uL — ABNORMAL HIGH (ref 0.00–0.07)
Basophils Absolute: 0.1 10*3/uL (ref 0.0–0.1)
Basophils Relative: 1 %
Eosinophils Absolute: 0.7 10*3/uL — ABNORMAL HIGH (ref 0.0–0.5)
Eosinophils Relative: 6 %
HCT: 23.2 % — ABNORMAL LOW (ref 39.0–52.0)
Hemoglobin: 8 g/dL — ABNORMAL LOW (ref 13.0–17.0)
Immature Granulocytes: 2 %
Lymphocytes Relative: 10 %
Lymphs Abs: 1.1 10*3/uL (ref 0.7–4.0)
MCH: 33.1 pg (ref 26.0–34.0)
MCHC: 34.5 g/dL (ref 30.0–36.0)
MCV: 95.9 fL (ref 80.0–100.0)
Monocytes Absolute: 0.7 10*3/uL (ref 0.1–1.0)
Monocytes Relative: 7 %
Neutro Abs: 8.2 10*3/uL — ABNORMAL HIGH (ref 1.7–7.7)
Neutrophils Relative %: 74 %
Platelets: 116 10*3/uL — ABNORMAL LOW (ref 150–400)
RBC: 2.42 MIL/uL — ABNORMAL LOW (ref 4.22–5.81)
RDW: 13.3 % (ref 11.5–15.5)
WBC: 10.9 10*3/uL — ABNORMAL HIGH (ref 4.0–10.5)
nRBC: 0 % (ref 0.0–0.2)

## 2023-01-26 LAB — CULTURE, BLOOD (ROUTINE X 2)
Special Requests: ADEQUATE
Special Requests: ADEQUATE

## 2023-01-26 LAB — BASIC METABOLIC PANEL
Anion gap: 7 (ref 5–15)
BUN: 16 mg/dL (ref 8–23)
CO2: 24 mmol/L (ref 22–32)
Calcium: 7.9 mg/dL — ABNORMAL LOW (ref 8.9–10.3)
Chloride: 108 mmol/L (ref 98–111)
Creatinine, Ser: 1.42 mg/dL — ABNORMAL HIGH (ref 0.61–1.24)
GFR, Estimated: 55 mL/min — ABNORMAL LOW (ref >60)
Glucose, Bld: 111 mg/dL — ABNORMAL HIGH (ref 70–99)
Potassium: 3.5 mmol/L (ref 3.5–5.1)
Sodium: 139 mmol/L (ref 135–145)

## 2023-01-26 MED ORDER — TAMSULOSIN HCL 0.4 MG PO CAPS: 0.4000 mg | ORAL_CAPSULE | Freq: Every day | ORAL | 0 refills | Status: AC

## 2023-01-26 MED ORDER — SODIUM CHLORIDE 0.9 % IV SOLN
2.0000 g | Freq: Once | INTRAVENOUS | Status: AC
  Administered 2023-01-26: 2 g via INTRAVENOUS
  Filled 2023-01-26: qty 20

## 2023-01-26 MED ORDER — CIPROFLOXACIN HCL 500 MG PO TABS: 500.0000 mg | ORAL_TABLET | Freq: Two times a day (BID) | ORAL | 0 refills | Status: AC

## 2023-01-26 NOTE — Progress Notes (Signed)
Mobility Specialist Progress Note:   01/26/23 1117  Mobility  Activity Ambulated with assistance in room  Level of Assistance Standby assist, set-up cues, supervision of patient - no hands on  Assistive Device Front wheel walker  Distance Ambulated (ft) 40 ft  Activity Response Tolerated well  Mobility Referral Yes  $Mobility charge 1 Mobility   Pt received in chair and agreeable. No complaints. Pt returned to chair with all needs met and call bell in reach.   Andrey Campanile Mobility Specialist Please contact via SecureChat or  Rehab office at [phone]

## 2023-01-26 NOTE — Discharge Summary (Signed)
Physician Discharge Summary  Alex West XIP:[id number] DOB: Oct 24, 1956 DOA: 01/23/2023  PCP: Lawerance Cruel, MD  Admit date: 01/23/2023 Discharge date: 01/26/2023  Admitted From: Home Disposition: Home  Recommendations for Outpatient Follow-up:  Follow up with PCP in 1-2 weeks Please obtain BMP/CBC in one week Schedule follow-up with urology in 1 to 2 weeks.  You will need a voiding trial.  Home Health: Continue home health PT Equipment/Devices: None  Discharge Condition: Stable CODE STATUS: Full code Diet recommendation: Low-salt diet  Discharge summary: Patient with history of autoimmune hepatitis, anemia, hypothyroidism, BPH who presented to emergency room with fatigue, fever 104 at home, generalized weakness for at least 1 week.  Cervical spine fusion 6 months ago, hip replacement 2 weeks ago. Patient is on Imuran.  He also reported urinary incontinence. Does have history of BPH. In the emergency room temperature 101.7, blood pressure 82/58, WC count 17K, BUN/creatinine 96/14 with recent normal creatinine levels.   MRI cervical, thoracic and lumbar spine with severe chronic stenosis, no evidence of infection. CT scan abdomen pelvis without contrast with distended urinary bladder and hydronephrosis. Foley catheter with more than 500 mL Muddy urine.  Blood cultures and urine cultures positive for Klebsiella pneumonia.  Clinically improved.   Severe sepsis present on admission secondary to Klebsiella bacteremia, Klebsiella UTI likely secondary to urinary obstruction. Acute on chronic urine retention secondary to BPH.   Clinically improving. Foley catheter to decompress bladder and will continue until outpatient urology follow-up.  Patient does have a urologist as outpatient. Rocephin day 4 today.  Clinically improved.  Since his renal functions are already improving, will prescribe ciprofloxacin 500 mg twice daily for additional 3 days to complete 7 days of therapy as we  have already achieved source control.   Acute severe renal failure: Secondary to BPH and urinary retention.  Foley catheter was successfully placed.  Dramatically improving renal functions.  Sodium is stable.  Creatinine 16-1.42 today. Going home with Foley catheter.  Will also continue Flomax.  Chronic medical issues including Cervical myelopathy: Chronic issue.  Currently without evidence of acute infection.  Continue to work with PT OT.  Will send home with home health.  Chronic auto immune hepatitis: On Imuran therapy.  Renal functions stabilized.  Resume at home.  Hypothyroidism: On Synthroid.   Adequately stabilized to go home.    Discharge Diagnoses:  Principal Problem:   AKI (acute kidney injury) ([place]) Active Problems:   Hepatitis, autoimmune ([place])   Debility   Cervical myelopathy ([place])   UTI (urinary tract infection)   Sepsis ([place])   Bacteremia    Discharge Instructions  Discharge Instructions     Diet - low sodium heart healthy   Complete by: As directed    Discharge instructions   Complete by: As directed    Call and schedule follow up with Urology in 1-2 weeks   Increase activity slowly   Complete by: As directed       Allergies as of 01/26/2023       Reactions   Tadalafil Other (See Comments)   Headache        Medication List     TAKE these medications    aspirin EC 81 MG tablet Take 1 tablet (81 mg total) by mouth 2 (two) times daily.   azaTHIOprine 50 MG tablet Commonly known as: IMURAN Take 75 mg by mouth daily.   CENTRUM ADULT PO Take 1 tablet by mouth daily.   ciprofloxacin 500 MG tablet Commonly known  as: Cipro Take 1 tablet (500 mg total) by mouth 2 (two) times daily for 3 days.   levothyroxine 200 MCG tablet Commonly known as: SYNTHROID Take 200 mcg by mouth daily before breakfast.   oxyCODONE-acetaminophen 5-325 MG tablet Commonly known as: PERCOCET/ROXICET Take 1 tablet by mouth every 4 (four) hours as needed for severe  pain.   tamsulosin 0.4 MG Caps capsule Commonly known as: FLOMAX Take 1 capsule (0.4 mg total) by mouth daily. Start taking on: January 27, 2023   tiZANidine 2 MG tablet Commonly known as: ZANAFLEX Take 1 tablet (2 mg total) by mouth every 6 (six) hours as needed for muscle spasms.        [place].   Specialty: Home Health Services Why: your home health agency Contact information: [place] [address] [phone]                Allergies  Allergen Reactions   Tadalafil Other (See Comments)    Headache     Consultations: Nephrology   Procedures/Studies: VAS [place] LOWER EXTREMITY VENOUS (DVT) (7a-7p)  Result Date: 01/24/2023  Lower Venous DVT Study Patient Name:  Alex West  Date of Exam:   01/23/2023 Medical Rec #: [id number]             Accession #:    [id number] Date of Birth: [birth year]             Patient Gender: M Patient Age:   67 years Exam Location:  [facility] Procedure:      VAS [place] LOWER EXTREMITY VENOUS (DVT) Referring Phys: Garnette Gunner --------------------------------------------------------------------------------  Indications: Edema.  Risk Factors: Surgery. Limitations: Poor ultrasound/tissue interface. Comparison Study: No prior studies. Performing Technologist: Oliver Hum RVT  Examination Guidelines: A complete evaluation includes B-mode imaging, spectral Doppler, color Doppler, and power Doppler as needed of all accessible portions of each vessel. Bilateral testing is considered an integral part of a complete examination. Limited examinations for reoccurring indications may be performed as noted. The reflux portion of the exam is performed with the patient in reverse Trendelenburg.  +-----+---------------+---------+-----------+----------+--------------+ RIGHTCompressibilityPhasicitySpontaneityPropertiesThrombus Aging  +-----+---------------+---------+-----------+----------+--------------+ CFV  Full           Yes      Yes                                 +-----+---------------+---------+-----------+----------+--------------+   +---------+---------------+---------+-----------+----------+--------------+ LEFT     CompressibilityPhasicitySpontaneityPropertiesThrombus Aging +---------+---------------+---------+-----------+----------+--------------+ CFV      Full           Yes      Yes                                 +---------+---------------+---------+-----------+----------+--------------+ SFJ      Full                                                        +---------+---------------+---------+-----------+----------+--------------+ FV Prox  Full                                                        +---------+---------------+---------+-----------+----------+--------------+  FV Mid   Full           Yes      Yes                                 +---------+---------------+---------+-----------+----------+--------------+ FV DistalFull                                                        +---------+---------------+---------+-----------+----------+--------------+ PFV      Full                                                        +---------+---------------+---------+-----------+----------+--------------+ POP      Full           Yes      Yes                                 +---------+---------------+---------+-----------+----------+--------------+ PTV      Full                                                        +---------+---------------+---------+-----------+----------+--------------+ PERO     Full                                                        +---------+---------------+---------+-----------+----------+--------------+     Summary: RIGHT: - No evidence of common femoral vein obstruction.  LEFT: - There is no evidence of deep vein thrombosis in the  lower extremity.  - No cystic structure found in the popliteal fossa.  *See table(s) above for measurements and observations. Electronically signed by Jamelle Haring on 01/24/2023 at 7:31:58 AM.    Final    MR LUMBAR SPINE WO CONTRAST  Result Date: 01/23/2023 CLINICAL DATA:  Back pain EXAM: MRI CERVICAL, THORACIC AND LUMBAR SPINE WITHOUT CONTRAST TECHNIQUE: Multiplanar and multiecho pulse sequences of the cervical spine, to include the craniocervical junction and cervicothoracic junction, and thoracic and lumbar spine, were obtained without intravenous contrast. COMPARISON:  09/18/2022 FINDINGS: MRI CERVICAL SPINE FINDINGS Alignment: Physiologic. Vertebrae: C3-6 ACDF.  No acute abnormality. Cord: Myelomalacia at the C3-4 level, unchanged. Posterior Fossa, vertebral arteries, paraspinal tissues: Negative. Disc levels: C1-2: Unremarkable. C2-3: Mild facet hypertrophy with small disc bulge and uncinate spurring. There is no spinal canal stenosis. Bilateral neural foraminal stenosis. C3-4: ACDF. Unchanged small disc bulge. Unchanged severe spinal canal stenosis. No neural foraminal stenosis. C4-5: ACDF. Mild facet hypertrophy. There is no spinal canal stenosis. No neural foraminal stenosis. C5-6: ACDF with small disc bulge and uncinate spurring. Unchanged mild spinal canal stenosis. Unchanged moderate bilateral neural foraminal stenosis. C6-7: Small disc bulge. There is no spinal canal stenosis. No neural foraminal stenosis. C7-T1: Normal disc space and facet  joints. There is no spinal canal stenosis. No neural foraminal stenosis. MRI THORACIC SPINE FINDINGS Alignment:  Physiologic. Vertebrae: No fracture, evidence of discitis, or bone lesion. Modic type 2 endplate signal changes at L4-5, unchanged. Cord:  Normal signal and morphology. Paraspinal and other soft tissues: Negative. Disc levels: There is mild multilevel degenerative disc disease, greatest at T9-10 where there is mild spinal canal stenosis. No stenosis at  any other level. MRI LUMBAR SPINE FINDINGS Segmentation:  Standard. Alignment:  Physiologic. Vertebrae:  No fracture, evidence of discitis, or bone lesion. Conus medullaris and cauda equina: Conus extends to the T12 level. Conus and cauda equina appear normal. Paraspinal and other soft tissues: Negative Disc levels: L1-L2: Normal disc space and facet joints. No spinal canal stenosis. No neural foraminal stenosis. L2-L3: Small disc bulge, unchanged. Unchanged mild spinal canal stenosis. No neural foraminal stenosis. L3-L4: Unchanged small disc bulge. Mild spinal canal stenosis. No neural foraminal stenosis. L4-L5: Unchanged intermediate sized disc bulge, right asymmetric. No spinal canal stenosis. Unchanged mild right neural foraminal stenosis. L5-S1: Normal disc space and facet joints. No spinal canal stenosis. No neural foraminal stenosis. Visualized sacrum: Normal. IMPRESSION: 1. C3-6 ACDF with unchanged severe spinal canal stenosis at C3-4 and focal myelomalacia. 2. Unchanged moderate bilateral C5-6 neural foraminal stenosis. 3. Mild multilevel thoracic degenerative disc disease with mild spinal canal stenosis at T9-10. 4. Unchanged mild spinal canal stenosis at L2-3, L3-4 and L4-5. 5. Unchanged mild right L4-5 neural foraminal stenosis. Electronically Signed   By: Ulyses Jarred M.D.   On: 01/23/2023 22:33   MR THORACIC SPINE WO CONTRAST  Result Date: 01/23/2023 CLINICAL DATA:  Back pain EXAM: MRI CERVICAL, THORACIC AND LUMBAR SPINE WITHOUT CONTRAST TECHNIQUE: Multiplanar and multiecho pulse sequences of the cervical spine, to include the craniocervical junction and cervicothoracic junction, and thoracic and lumbar spine, were obtained without intravenous contrast. COMPARISON:  09/18/2022 FINDINGS: MRI CERVICAL SPINE FINDINGS Alignment: Physiologic. Vertebrae: C3-6 ACDF.  No acute abnormality. Cord: Myelomalacia at the C3-4 level, unchanged. Posterior Fossa, vertebral arteries, paraspinal tissues: Negative.  Disc levels: C1-2: Unremarkable. C2-3: Mild facet hypertrophy with small disc bulge and uncinate spurring. There is no spinal canal stenosis. Bilateral neural foraminal stenosis. C3-4: ACDF. Unchanged small disc bulge. Unchanged severe spinal canal stenosis. No neural foraminal stenosis. C4-5: ACDF. Mild facet hypertrophy. There is no spinal canal stenosis. No neural foraminal stenosis. C5-6: ACDF with small disc bulge and uncinate spurring. Unchanged mild spinal canal stenosis. Unchanged moderate bilateral neural foraminal stenosis. C6-7: Small disc bulge. There is no spinal canal stenosis. No neural foraminal stenosis. C7-T1: Normal disc space and facet joints. There is no spinal canal stenosis. No neural foraminal stenosis. MRI THORACIC SPINE FINDINGS Alignment:  Physiologic. Vertebrae: No fracture, evidence of discitis, or bone lesion. Modic type 2 endplate signal changes at L4-5, unchanged. Cord:  Normal signal and morphology. Paraspinal and other soft tissues: Negative. Disc levels: There is mild multilevel degenerative disc disease, greatest at T9-10 where there is mild spinal canal stenosis. No stenosis at any other level. MRI LUMBAR SPINE FINDINGS Segmentation:  Standard. Alignment:  Physiologic. Vertebrae:  No fracture, evidence of discitis, or bone lesion. Conus medullaris and cauda equina: Conus extends to the T12 level. Conus and cauda equina appear normal. Paraspinal and other soft tissues: Negative Disc levels: L1-L2: Normal disc space and facet joints. No spinal canal stenosis. No neural foraminal stenosis. L2-L3: Small disc bulge, unchanged. Unchanged mild spinal canal stenosis. No neural foraminal stenosis. L3-L4: Unchanged small disc bulge. Mild  spinal canal stenosis. No neural foraminal stenosis. L4-L5: Unchanged intermediate sized disc bulge, right asymmetric. No spinal canal stenosis. Unchanged mild right neural foraminal stenosis. L5-S1: Normal disc space and facet joints. No spinal canal  stenosis. No neural foraminal stenosis. Visualized sacrum: Normal. IMPRESSION: 1. C3-6 ACDF with unchanged severe spinal canal stenosis at C3-4 and focal myelomalacia. 2. Unchanged moderate bilateral C5-6 neural foraminal stenosis. 3. Mild multilevel thoracic degenerative disc disease with mild spinal canal stenosis at T9-10. 4. Unchanged mild spinal canal stenosis at L2-3, L3-4 and L4-5. 5. Unchanged mild right L4-5 neural foraminal stenosis. Electronically Signed   By: Ulyses Jarred M.D.   On: 01/23/2023 22:33   MR Cervical Spine Wo Contrast  Result Date: 01/23/2023 CLINICAL DATA:  Back pain EXAM: MRI CERVICAL, THORACIC AND LUMBAR SPINE WITHOUT CONTRAST TECHNIQUE: Multiplanar and multiecho pulse sequences of the cervical spine, to include the craniocervical junction and cervicothoracic junction, and thoracic and lumbar spine, were obtained without intravenous contrast. COMPARISON:  09/18/2022 FINDINGS: MRI CERVICAL SPINE FINDINGS Alignment: Physiologic. Vertebrae: C3-6 ACDF.  No acute abnormality. Cord: Myelomalacia at the C3-4 level, unchanged. Posterior Fossa, vertebral arteries, paraspinal tissues: Negative. Disc levels: C1-2: Unremarkable. C2-3: Mild facet hypertrophy with small disc bulge and uncinate spurring. There is no spinal canal stenosis. Bilateral neural foraminal stenosis. C3-4: ACDF. Unchanged small disc bulge. Unchanged severe spinal canal stenosis. No neural foraminal stenosis. C4-5: ACDF. Mild facet hypertrophy. There is no spinal canal stenosis. No neural foraminal stenosis. C5-6: ACDF with small disc bulge and uncinate spurring. Unchanged mild spinal canal stenosis. Unchanged moderate bilateral neural foraminal stenosis. C6-7: Small disc bulge. There is no spinal canal stenosis. No neural foraminal stenosis. C7-T1: Normal disc space and facet joints. There is no spinal canal stenosis. No neural foraminal stenosis. MRI THORACIC SPINE FINDINGS Alignment:  Physiologic. Vertebrae: No fracture,  evidence of discitis, or bone lesion. Modic type 2 endplate signal changes at L4-5, unchanged. Cord:  Normal signal and morphology. Paraspinal and other soft tissues: Negative. Disc levels: There is mild multilevel degenerative disc disease, greatest at T9-10 where there is mild spinal canal stenosis. No stenosis at any other level. MRI LUMBAR SPINE FINDINGS Segmentation:  Standard. Alignment:  Physiologic. Vertebrae:  No fracture, evidence of discitis, or bone lesion. Conus medullaris and cauda equina: Conus extends to the T12 level. Conus and cauda equina appear normal. Paraspinal and other soft tissues: Negative Disc levels: L1-L2: Normal disc space and facet joints. No spinal canal stenosis. No neural foraminal stenosis. L2-L3: Small disc bulge, unchanged. Unchanged mild spinal canal stenosis. No neural foraminal stenosis. L3-L4: Unchanged small disc bulge. Mild spinal canal stenosis. No neural foraminal stenosis. L4-L5: Unchanged intermediate sized disc bulge, right asymmetric. No spinal canal stenosis. Unchanged mild right neural foraminal stenosis. L5-S1: Normal disc space and facet joints. No spinal canal stenosis. No neural foraminal stenosis. Visualized sacrum: Normal. IMPRESSION: 1. C3-6 ACDF with unchanged severe spinal canal stenosis at C3-4 and focal myelomalacia. 2. Unchanged moderate bilateral C5-6 neural foraminal stenosis. 3. Mild multilevel thoracic degenerative disc disease with mild spinal canal stenosis at T9-10. 4. Unchanged mild spinal canal stenosis at L2-3, L3-4 and L4-5. 5. Unchanged mild right L4-5 neural foraminal stenosis. Electronically Signed   By: Ulyses Jarred M.D.   On: 01/23/2023 22:33   CT ABDOMEN PELVIS WO CONTRAST  Result Date: 01/23/2023 CLINICAL DATA:  Kidney failure, acute renal failure EXAM: CT ABDOMEN AND PELVIS WITHOUT CONTRAST TECHNIQUE: Multidetector CT imaging of the abdomen and pelvis was performed following the standard  protocol without IV contrast. RADIATION DOSE  REDUCTION: This exam was performed according to the departmental dose-optimization program which includes automated exposure control, adjustment of the mA and/or kV according to patient size and/or use of iterative reconstruction technique. COMPARISON:  CT 03/26/2018. FINDINGS: Lower chest: No acute abnormality. Hepatobiliary: No focal liver abnormality is seen. Decompressed gallbladder with cholelithiasis. Pancreas: Unremarkable. No pancreatic ductal dilatation or surrounding inflammatory changes. Spleen: Normal in size without focal abnormality. Adrenals/Urinary Tract: Adrenal glands are unremarkable. Moderate bilateral hydronephrosis. No nephroureterolithiasis. Mild perinephric stranding bilaterally. Markedly dilated urinary bladder. Stomach/Bowel: The stomach is within normal limits. There is no evidence of bowel obstruction.The appendix is normal. Vascular/Lymphatic: Aortoiliac atherosclerosis. No AAA. No lymphadenopathy. Retroaortic left renal vein. Reproductive: Enlarged prostate gland. Bilateral scrotal varicoceles. Other: Small fat containing umbilical hernia. No abdominopelvic ascites. Musculoskeletal: No acute osseous abnormality. No suspicious osseous lesion. Prior left hip arthroplasty. Normal alignment. Moderate right hip osteoarthritis. Degenerative changes of the spine most prominent at L4-L5. IMPRESSION: Findings suggestive of bladder outlet obstruction due to an enlarged prostate gland. Markedly distended urinary bladder and moderate bilateral hydroureteronephrosis. No other acute findings in the abdomen or pelvis. Additional chronic findings as described above. Electronically Signed   By: Maurine Simmering M.D.   On: 01/23/2023 18:46   DG Hip Unilat With Pelvis 2-3 Views Left  Result Date: 01/23/2023 CLINICAL DATA:  Sepsis, surgery EXAM: DG HIP (WITH OR WITHOUT PELVIS) 3V LEFT COMPARISON:  08/14/2021. FINDINGS: Status post left total hip arthroplasty. No periprosthetic lucency to suggest loosening.  No acute fracture, dislocation or subluxation. Pelvic ring intact. IMPRESSION: Unremarkable appearance status post left total hip arthroplasty. Electronically Signed   By: Sammie Bench M.D.   On: 01/23/2023 17:29   DG Chest 1 View  Result Date: 01/23/2023 CLINICAL DATA:  Recent surgery, sepsis EXAM: CHEST  1 VIEW COMPARISON:  None Available. FINDINGS: The heart is enlarged. The lungs are clear. There is no pleural effusion or pneumothorax. No acute fractures are seen. IMPRESSION: Cardiomegaly. No acute pulmonary process. Electronically Signed   By: Ronney Asters M.D.   On: 01/23/2023 17:29   DG HIP UNILAT WITH PELVIS 1V LEFT  Result Date: 01/06/2023 CLINICAL DATA:  [id number] Surgery, elective [id number] EXAM: DG HIP (WITH OR WITHOUT PELVIS) 1V*L* COMPARISON:  08/14/2021 FINDINGS: Interval left hip arthroplasty, components projecting in the expected location. No fracture or dislocation. IMPRESSION: Negative. Electronically Signed   By: Lucrezia Europe M.D.   On: 01/06/2023 11:17   DG C-Arm 1-60 Min-No Report  Result Date: 01/06/2023 Fluoroscopy was utilized by the requesting physician.  No radiographic interpretation.   (Echo, Carotid, EGD, Colonoscopy, ERCP)    Subjective: Patient seen and examined.  Denies any complaints.  Eager to go home.  He thinks once he goes home he will have better mobility and will continue to improve his strength.  Denies any nausea or vomiting.  Denies any abdominal pain.  Tolerated Foley catheter well.  Catheter now with normal colored urine.   Discharge Exam: Vitals:   01/26/23 0350 01/26/23 0815  BP: (!) 143/91 122/82  Pulse: 75 66  Resp: 17 18  Temp: 99.4 F (37.4 C) 99.2 F (37.3 C)  SpO2: 92% 100%   Vitals:   01/25/23 2021 01/25/23 2246 01/26/23 0350 01/26/23 0815  BP: 117/75 132/73 (!) 143/91 122/82  Pulse: 83 84 75 66  Resp: '19 18 17 18  '$ Temp: 98.3 F (36.8 C) 98.9 F (37.2 C) 99.4 F (37.4 C) 99.2 F (37.3 C)  TempSrc: Oral Oral Oral Oral  SpO2:  92% 92% 92% 100%  Weight:      Height:        General: Pt is alert, awake, not in acute distress Cardiovascular: RRR, S1/S2 +, no rubs, no gallops Respiratory: CTA bilaterally, no wheezing, no rhonchi Abdominal: Soft, NT, ND, bowel sounds + Extremities: no edema, no cyanosis Foley catheter with amber urine.    The results of significant diagnostics from this hospitalization (including imaging, microbiology, ancillary and laboratory) are listed below for reference.     Microbiology: Recent Results (from the past 240 hour(s))  Resp panel by RT-PCR (RSV, Flu A&B, Covid) Anterior Nasal Swab     Status: None   Collection Time: 01/23/23  3:18 PM   Specimen: Anterior Nasal Swab  Result Value Ref Range Status   SARS Coronavirus 2 by RT PCR NEGATIVE NEGATIVE Final   Influenza A by PCR NEGATIVE NEGATIVE Final   Influenza B by PCR NEGATIVE NEGATIVE Final    Comment: (NOTE) The Xpert Xpress SARS-CoV-2/FLU/RSV plus assay is intended as an aid in the diagnosis of influenza from Nasopharyngeal swab specimens and should not be used as a sole basis for treatment. Nasal washings and aspirates are unacceptable for Xpert Xpress SARS-CoV-2/FLU/RSV testing.  Fact Sheet for Patients: EntrepreneurPulse.com.au  Fact Sheet for Healthcare Providers: IncredibleEmployment.be  This test is not yet approved or cleared by the [place] FDA and has been authorized for detection and/or diagnosis of SARS-CoV-2 by FDA under an Emergency Use Authorization (EUA). This EUA will remain in effect (meaning this test can be used) for the duration of the COVID-19 declaration under Section 564(b)(1) of the Act, 21 U.S.C. section 360bbb-3(b)(1), unless the authorization is terminated or revoked.     Resp Syncytial Virus by PCR NEGATIVE NEGATIVE Final    Comment: (NOTE) Fact Sheet for Patients: EntrepreneurPulse.com.au  Fact Sheet for Healthcare  Providers: IncredibleEmployment.be  This test is not yet approved or cleared by the [place] FDA and has been authorized for detection and/or diagnosis of SARS-CoV-2 by FDA under an Emergency Use Authorization (EUA). This EUA will remain in effect (meaning this test can be used) for the duration of the COVID-19 declaration under Section 564(b)(1) of the Act, 21 U.S.C. section 360bbb-3(b)(1), unless the authorization is terminated or revoked.  Performed at [facility], [place] [address], [place], [place] [address]   Blood Culture (routine x 2)     Status: Abnormal (Preliminary result)   Collection Time: 01/23/23  3:43 PM   Specimen: BLOOD  Result Value Ref Range Status   Specimen Description BLOOD RIGHT ANTECUBITAL  Final   Special Requests   Final    BOTTLES DRAWN AEROBIC AND ANAEROBIC Blood Culture adequate volume   Culture  Setup Time   Final    GRAM NEGATIVE RODS IN BOTH AEROBIC AND ANAEROBIC BOTTLES CRITICAL RESULT CALLED TO, READ BACK BY AND VERIFIED WITH: T RUDISILL,PHARMD'@0537'$  01/24/23 [place] Performed at [facility], [place] [address], [place], [place] [address]    Culture KLEBSIELLA PNEUMONIAE (A)  Final   Report Status PENDING  Incomplete   Organism ID, Bacteria KLEBSIELLA PNEUMONIAE  Final      Susceptibility   Klebsiella pneumoniae - MIC*    AMPICILLIN RESISTANT Resistant     CEFAZOLIN <=4 SENSITIVE Sensitive     CEFEPIME <=0.12 SENSITIVE Sensitive     CEFTAZIDIME <=1 SENSITIVE Sensitive     CEFTRIAXONE <=0.25 SENSITIVE Sensitive     CIPROFLOXACIN <=0.25 SENSITIVE  Sensitive     GENTAMICIN <=1 SENSITIVE Sensitive     IMIPENEM <=0.25 SENSITIVE Sensitive     TRIMETH/SULFA <=20 SENSITIVE Sensitive     AMPICILLIN/SULBACTAM <=2 SENSITIVE Sensitive     PIP/TAZO <=4 SENSITIVE Sensitive     * KLEBSIELLA PNEUMONIAE  Blood Culture ID Panel (Reflexed)     Status: Abnormal   Collection Time: 01/23/23  3:43 PM  Result Value Ref Range Status    Enterococcus faecalis NOT DETECTED NOT DETECTED Final   Enterococcus Faecium NOT DETECTED NOT DETECTED Final   Listeria monocytogenes NOT DETECTED NOT DETECTED Final   Staphylococcus species NOT DETECTED NOT DETECTED Final   Staphylococcus aureus (BCID) NOT DETECTED NOT DETECTED Final   Staphylococcus epidermidis NOT DETECTED NOT DETECTED Final   Staphylococcus lugdunensis NOT DETECTED NOT DETECTED Final   Streptococcus species NOT DETECTED NOT DETECTED Final   Streptococcus agalactiae NOT DETECTED NOT DETECTED Final   Streptococcus pneumoniae NOT DETECTED NOT DETECTED Final   Streptococcus pyogenes NOT DETECTED NOT DETECTED Final   A.calcoaceticus-baumannii NOT DETECTED NOT DETECTED Final   Bacteroides fragilis NOT DETECTED NOT DETECTED Final   Enterobacterales DETECTED (A) NOT DETECTED Final    Comment: Enterobacterales represent a large order of gram negative bacteria, not a single organism. CRITICAL RESULT CALLED TO, READ BACK BY AND VERIFIED WITH: T RUDISILL,PHARMD'@0538'$  01/24/23 [place]    Enterobacter cloacae complex NOT DETECTED NOT DETECTED Final   Escherichia coli NOT DETECTED NOT DETECTED Final   Klebsiella aerogenes NOT DETECTED NOT DETECTED Final   Klebsiella oxytoca NOT DETECTED NOT DETECTED Final   Klebsiella pneumoniae DETECTED (A) NOT DETECTED Final    Comment: CRITICAL RESULT CALLED TO, READ BACK BY AND VERIFIED WITH: T RUDISILL,PHARMD'@0538'$  01/24/23 [place]    Proteus species NOT DETECTED NOT DETECTED Final   Salmonella species NOT DETECTED NOT DETECTED Final   Serratia marcescens NOT DETECTED NOT DETECTED Final   Haemophilus influenzae NOT DETECTED NOT DETECTED Final   Neisseria meningitidis NOT DETECTED NOT DETECTED Final   Pseudomonas aeruginosa NOT DETECTED NOT DETECTED Final   Stenotrophomonas maltophilia NOT DETECTED NOT DETECTED Final   Candida albicans NOT DETECTED NOT DETECTED Final   Candida auris NOT DETECTED NOT DETECTED Final   Candida glabrata NOT DETECTED NOT  DETECTED Final   Candida krusei NOT DETECTED NOT DETECTED Final   Candida parapsilosis NOT DETECTED NOT DETECTED Final   Candida tropicalis NOT DETECTED NOT DETECTED Final   Cryptococcus neoformans/gattii NOT DETECTED NOT DETECTED Final   CTX-M ESBL NOT DETECTED NOT DETECTED Final   Carbapenem resistance IMP NOT DETECTED NOT DETECTED Final   Carbapenem resistance KPC NOT DETECTED NOT DETECTED Final   Carbapenem resistance NDM NOT DETECTED NOT DETECTED Final   Carbapenem resist OXA 48 LIKE NOT DETECTED NOT DETECTED Final   Carbapenem resistance VIM NOT DETECTED NOT DETECTED Final    Comment: Performed at [facility], [address] [place], [place] [address]  Blood Culture (routine x 2)     Status: Abnormal (Preliminary result)   Collection Time: 01/23/23  3:49 PM   Specimen: BLOOD  Result Value Ref Range Status   Specimen Description BLOOD BLOOD RIGHT WRIST  Final   Special Requests   Final    BOTTLES DRAWN AEROBIC AND ANAEROBIC Blood Culture adequate volume   Culture  Setup Time   Final    GRAM NEGATIVE RODS IN BOTH AEROBIC AND ANAEROBIC BOTTLES CRITICAL VALUE NOTED.  VALUE IS CONSISTENT WITH PREVIOUSLY REPORTED AND CALLED VALUE.  Culture (A)  Final    KLEBSIELLA PNEUMONIAE SUSCEPTIBILITIES PERFORMED ON PREVIOUS CULTURE WITHIN THE LAST 5 DAYS. Performed at [facility], [place] [address], [place], [place] [address]    Report Status PENDING  Incomplete  Urine Culture     Status: Abnormal   Collection Time: 01/23/23  6:40 PM   Specimen: Urine, Random  Result Value Ref Range Status   Specimen Description URINE, RANDOM  Final   Special Requests   Final    NONE Reflexed from [id number] Performed at [facility], [place] [address] [place], [place] [address]    Culture >=100,000 COLONIES/mL KLEBSIELLA PNEUMONIAE (A)  Final   Report Status 01/25/2023 FINAL  Final   Organism ID, Bacteria KLEBSIELLA PNEUMONIAE (A)  Final      Susceptibility   Klebsiella pneumoniae -  MIC*    AMPICILLIN RESISTANT Resistant     CEFAZOLIN <=4 SENSITIVE Sensitive     CEFEPIME <=0.12 SENSITIVE Sensitive     CEFTRIAXONE <=0.25 SENSITIVE Sensitive     CIPROFLOXACIN <=0.25 SENSITIVE Sensitive     GENTAMICIN <=1 SENSITIVE Sensitive     IMIPENEM <=0.25 SENSITIVE Sensitive     NITROFURANTOIN 64 INTERMEDIATE Intermediate     TRIMETH/SULFA <=20 SENSITIVE Sensitive     AMPICILLIN/SULBACTAM <=2 SENSITIVE Sensitive     PIP/TAZO <=4 SENSITIVE Sensitive     * >=100,000 COLONIES/mL KLEBSIELLA PNEUMONIAE     Labs: BNP (last 3 results) No results for input(s): "BNP" in the last 8760 hours. Basic Metabolic Panel: Recent Labs  Lab 01/23/23 1547 01/23/23 2001 01/23/23 2358 01/24/23 1652 01/25/23 0105 01/26/23 0101  NA 129* 134* 136 139 139 139  K 5.1 4.9 5.6* 3.8 3.7 3.5  CL 102 104  --  109 109 108  CO2 18*  --   --  '22 25 24  '$ GLUCOSE 117* 104*  --  127* 120* 111*  BUN 96* 92*  --  42* 29* 16  CREATININE 14.05* 12.40*  --  3.04* 2.17* 1.42*  CALCIUM 8.2*  --   --  8.3* 8.1* 7.9*  MG  --   --   --   --  1.8  --    Liver Function Tests: Recent Labs  Lab 01/23/23 1547 01/25/23 0105  AST 25 36  ALT 12 20  ALKPHOS 45 47  BILITOT 0.8 0.9  PROT 6.2* 5.6*  ALBUMIN 2.9* 2.5*   No results for input(s): "LIPASE", "AMYLASE" in the last 168 hours. Recent Labs  Lab 01/23/23 2345  AMMONIA 28   CBC: Recent Labs  Lab 01/23/23 1547 01/23/23 2001 01/23/23 2345 01/23/23 2358 01/25/23 0105 01/26/23 0101  WBC 14.2*  --  17.6*  --  12.8* 10.9*  NEUTROABS 12.2*  --  15.3*  --  10.8* 8.2*  HGB 8.4* 9.2* 7.6* 7.5* 8.0* 8.0*  HCT 24.1* 27.0* 22.0* 22.0* 22.2* 23.2*  MCV 94.9  --  97.3  --  93.3 95.9  PLT 116*  --  84*  --  114* 116*   Cardiac Enzymes: Recent Labs  Lab 01/23/23 2345 01/24/23 0419  CKTOTAL 575* 459*   BNP: Invalid input(s): "POCBNP" CBG: No results for input(s): "GLUCAP" in the last 168 hours. D-Dimer No results for input(s): "DDIMER" in the last  72 hours. Hgb A1c No results for input(s): "HGBA1C" in the last 72 hours. Lipid Profile No results for input(s): "CHOL", "HDL", "LDLCALC", "TRIG", "CHOLHDL", "LDLDIRECT" in the last 72 hours. Thyroid function studies Recent Labs  01/23/23 2345  TSH 4.894*   Anemia work up Recent Labs    01/23/23 2345  VITAMINB12 416  FOLATE 22.4  FERRITIN 784*  TIBC 182*  IRON 25*  RETICCTPCT 2.5   Urinalysis    Component Value Date/Time   COLORURINE YELLOW 01/23/2023 1840   APPEARANCEUR CLOUDY (A) 01/23/2023 1840   LABSPEC 1.010 01/23/2023 1840   PHURINE 5.0 01/23/2023 1840   GLUCOSEU NEGATIVE 01/23/2023 1840   HGBUR LARGE (A) 01/23/2023 1840   BILIRUBINUR NEGATIVE 01/23/2023 1840   BILIRUBINUR neg 09/05/2014 1545   KETONESUR NEGATIVE 01/23/2023 1840   PROTEINUR NEGATIVE 01/23/2023 1840   UROBILINOGEN 0.2 09/05/2014 1545   NITRITE NEGATIVE 01/23/2023 1840   LEUKOCYTESUR LARGE (A) 01/23/2023 1840   Sepsis Labs Recent Labs  Lab 01/23/23 1547 01/23/23 2345 01/25/23 0105 01/26/23 0101  WBC 14.2* 17.6* 12.8* 10.9*   Microbiology Recent Results (from the past 240 hour(s))  Resp panel by RT-PCR (RSV, Flu A&B, Covid) Anterior Nasal Swab     Status: None   Collection Time: 01/23/23  3:18 PM   Specimen: Anterior Nasal Swab  Result Value Ref Range Status   SARS Coronavirus 2 by RT PCR NEGATIVE NEGATIVE Final   Influenza A by PCR NEGATIVE NEGATIVE Final   Influenza B by PCR NEGATIVE NEGATIVE Final    Comment: (NOTE) The Xpert Xpress SARS-CoV-2/FLU/RSV plus assay is intended as an aid in the diagnosis of influenza from Nasopharyngeal swab specimens and should not be used as a sole basis for treatment. Nasal washings and aspirates are unacceptable for Xpert Xpress SARS-CoV-2/FLU/RSV testing.  Fact Sheet for Patients: EntrepreneurPulse.com.au  Fact Sheet for Healthcare Providers: IncredibleEmployment.be  This test is not yet approved or  cleared by the [place] FDA and has been authorized for detection and/or diagnosis of SARS-CoV-2 by FDA under an Emergency Use Authorization (EUA). This EUA will remain in effect (meaning this test can be used) for the duration of the COVID-19 declaration under Section 564(b)(1) of the Act, 21 U.S.C. section 360bbb-3(b)(1), unless the authorization is terminated or revoked.     Resp Syncytial Virus by PCR NEGATIVE NEGATIVE Final    Comment: (NOTE) Fact Sheet for Patients: EntrepreneurPulse.com.au  Fact Sheet for Healthcare Providers: IncredibleEmployment.be  This test is not yet approved or cleared by the [place] FDA and has been authorized for detection and/or diagnosis of SARS-CoV-2 by FDA under an Emergency Use Authorization (EUA). This EUA will remain in effect (meaning this test can be used) for the duration of the COVID-19 declaration under Section 564(b)(1) of the Act, 21 U.S.C. section 360bbb-3(b)(1), unless the authorization is terminated or revoked.  Performed at [facility], [place] [address], [place], [place] [address]   Blood Culture (routine x 2)     Status: Abnormal (Preliminary result)   Collection Time: 01/23/23  3:43 PM   Specimen: BLOOD  Result Value Ref Range Status   Specimen Description BLOOD RIGHT ANTECUBITAL  Final   Special Requests   Final    BOTTLES DRAWN AEROBIC AND ANAEROBIC Blood Culture adequate volume   Culture  Setup Time   Final    GRAM NEGATIVE RODS IN BOTH AEROBIC AND ANAEROBIC BOTTLES CRITICAL RESULT CALLED TO, READ BACK BY AND VERIFIED WITH: T RUDISILL,PHARMD'@0537'$  01/24/23 [place] Performed at [facility], [place] [address], [place], [place] [address]    Culture KLEBSIELLA PNEUMONIAE (A)  Final   Report Status PENDING  Incomplete   Organism ID, Bacteria KLEBSIELLA PNEUMONIAE  Final  Susceptibility   Klebsiella pneumoniae - MIC*    AMPICILLIN RESISTANT Resistant     CEFAZOLIN <=4  SENSITIVE Sensitive     CEFEPIME <=0.12 SENSITIVE Sensitive     CEFTAZIDIME <=1 SENSITIVE Sensitive     CEFTRIAXONE <=0.25 SENSITIVE Sensitive     CIPROFLOXACIN <=0.25 SENSITIVE Sensitive     GENTAMICIN <=1 SENSITIVE Sensitive     IMIPENEM <=0.25 SENSITIVE Sensitive     TRIMETH/SULFA <=20 SENSITIVE Sensitive     AMPICILLIN/SULBACTAM <=2 SENSITIVE Sensitive     PIP/TAZO <=4 SENSITIVE Sensitive     * KLEBSIELLA PNEUMONIAE  Blood Culture ID Panel (Reflexed)     Status: Abnormal   Collection Time: 01/23/23  3:43 PM  Result Value Ref Range Status   Enterococcus faecalis NOT DETECTED NOT DETECTED Final   Enterococcus Faecium NOT DETECTED NOT DETECTED Final   Listeria monocytogenes NOT DETECTED NOT DETECTED Final   Staphylococcus species NOT DETECTED NOT DETECTED Final   Staphylococcus aureus (BCID) NOT DETECTED NOT DETECTED Final   Staphylococcus epidermidis NOT DETECTED NOT DETECTED Final   Staphylococcus lugdunensis NOT DETECTED NOT DETECTED Final   Streptococcus species NOT DETECTED NOT DETECTED Final   Streptococcus agalactiae NOT DETECTED NOT DETECTED Final   Streptococcus pneumoniae NOT DETECTED NOT DETECTED Final   Streptococcus pyogenes NOT DETECTED NOT DETECTED Final   A.calcoaceticus-baumannii NOT DETECTED NOT DETECTED Final   Bacteroides fragilis NOT DETECTED NOT DETECTED Final   Enterobacterales DETECTED (A) NOT DETECTED Final    Comment: Enterobacterales represent a large order of gram negative bacteria, not a single organism. CRITICAL RESULT CALLED TO, READ BACK BY AND VERIFIED WITH: T RUDISILL,PHARMD'@0538'$  01/24/23 [place]    Enterobacter cloacae complex NOT DETECTED NOT DETECTED Final   Escherichia coli NOT DETECTED NOT DETECTED Final   Klebsiella aerogenes NOT DETECTED NOT DETECTED Final   Klebsiella oxytoca NOT DETECTED NOT DETECTED Final   Klebsiella pneumoniae DETECTED (A) NOT DETECTED Final    Comment: CRITICAL RESULT CALLED TO, READ BACK BY AND VERIFIED WITH: T  RUDISILL,PHARMD'@0538'$  01/24/23 [place]    Proteus species NOT DETECTED NOT DETECTED Final   Salmonella species NOT DETECTED NOT DETECTED Final   Serratia marcescens NOT DETECTED NOT DETECTED Final   Haemophilus influenzae NOT DETECTED NOT DETECTED Final   Neisseria meningitidis NOT DETECTED NOT DETECTED Final   Pseudomonas aeruginosa NOT DETECTED NOT DETECTED Final   Stenotrophomonas maltophilia NOT DETECTED NOT DETECTED Final   Candida albicans NOT DETECTED NOT DETECTED Final   Candida auris NOT DETECTED NOT DETECTED Final   Candida glabrata NOT DETECTED NOT DETECTED Final   Candida krusei NOT DETECTED NOT DETECTED Final   Candida parapsilosis NOT DETECTED NOT DETECTED Final   Candida tropicalis NOT DETECTED NOT DETECTED Final   Cryptococcus neoformans/gattii NOT DETECTED NOT DETECTED Final   CTX-M ESBL NOT DETECTED NOT DETECTED Final   Carbapenem resistance IMP NOT DETECTED NOT DETECTED Final   Carbapenem resistance KPC NOT DETECTED NOT DETECTED Final   Carbapenem resistance NDM NOT DETECTED NOT DETECTED Final   Carbapenem resist OXA 48 LIKE NOT DETECTED NOT DETECTED Final   Carbapenem resistance VIM NOT DETECTED NOT DETECTED Final    Comment: Performed at [facility], [address], [place], [place] [address]  Blood Culture (routine x 2)     Status: Abnormal (Preliminary result)   Collection Time: 01/23/23  3:49 PM   Specimen: BLOOD  Result Value Ref Range Status   Specimen Description BLOOD BLOOD RIGHT WRIST  Final   Special Requests  Final    BOTTLES DRAWN AEROBIC AND ANAEROBIC Blood Culture adequate volume   Culture  Setup Time   Final    GRAM NEGATIVE RODS IN BOTH AEROBIC AND ANAEROBIC BOTTLES CRITICAL VALUE NOTED.  VALUE IS CONSISTENT WITH PREVIOUSLY REPORTED AND CALLED VALUE.    Culture (A)  Final    KLEBSIELLA PNEUMONIAE SUSCEPTIBILITIES PERFORMED ON PREVIOUS CULTURE WITHIN THE LAST 5 DAYS. Performed at [facility], [place] [address] [place], [place]  [address]    Report Status PENDING  Incomplete  Urine Culture     Status: Abnormal   Collection Time: 01/23/23  6:40 PM   Specimen: Urine, Random  Result Value Ref Range Status   Specimen Description URINE, RANDOM  Final   Special Requests   Final    NONE Reflexed from [id number] Performed at [facility], [place] [address], [place], [place] [address]    Culture >=100,000 COLONIES/mL KLEBSIELLA PNEUMONIAE (A)  Final   Report Status 01/25/2023 FINAL  Final   Organism ID, Bacteria KLEBSIELLA PNEUMONIAE (A)  Final      Susceptibility   Klebsiella pneumoniae - MIC*    AMPICILLIN RESISTANT Resistant     CEFAZOLIN <=4 SENSITIVE Sensitive     CEFEPIME <=0.12 SENSITIVE Sensitive     CEFTRIAXONE <=0.25 SENSITIVE Sensitive     CIPROFLOXACIN <=0.25 SENSITIVE Sensitive     GENTAMICIN <=1 SENSITIVE Sensitive     IMIPENEM <=0.25 SENSITIVE Sensitive     NITROFURANTOIN 64 INTERMEDIATE Intermediate     TRIMETH/SULFA <=20 SENSITIVE Sensitive     AMPICILLIN/SULBACTAM <=2 SENSITIVE Sensitive     PIP/TAZO <=4 SENSITIVE Sensitive     * >=100,000 COLONIES/mL KLEBSIELLA PNEUMONIAE     Time coordinating discharge: 35 minutes  SIGNED:   Barb Merino, MD  [facility] 01/26/2023, 11:20 AM

## 2023-01-26 NOTE — Progress Notes (Incomplete)
Patient given discharge instructions. PIVs removed. Urinary foley leg bag attached. Patient taken to vehicle in wheelchair by staff.  Day[place] Larsen, RN

## 2023-02-02 ENCOUNTER — Encounter: Payer: Self-pay | Admitting: Neurology

## 2023-02-03 ENCOUNTER — Telehealth: Payer: Self-pay | Admitting: *Deleted

## 2023-02-03 NOTE — Telephone Encounter (Signed)
Pt cd @ the front desk for p/u

## 2023-02-27 DIAGNOSIS — D849 Immunodeficiency, unspecified: Secondary | ICD-10-CM | POA: Diagnosis not present

## 2023-02-27 DIAGNOSIS — K754 Autoimmune hepatitis: Secondary | ICD-10-CM | POA: Diagnosis not present

## 2023-02-27 DIAGNOSIS — K7401 Hepatic fibrosis, early fibrosis: Secondary | ICD-10-CM | POA: Diagnosis not present

## 2023-02-27 DIAGNOSIS — K760 Fatty (change of) liver, not elsewhere classified: Secondary | ICD-10-CM | POA: Diagnosis not present

## 2023-03-06 DIAGNOSIS — R3914 Feeling of incomplete bladder emptying: Secondary | ICD-10-CM | POA: Diagnosis not present

## 2023-03-06 DIAGNOSIS — R338 Other retention of urine: Secondary | ICD-10-CM | POA: Diagnosis not present

## 2023-03-14 NOTE — Therapy (Signed)
OUTPATIENT PHYSICAL THERAPY LOWER EXTREMITY EVALUATION   Patient Name: Alex West MRN: [id number] DOB:05-10-1956, 67 y.o., male Today's Date: 03/20/2023   END OF SESSION:  PT End of Session - 03/20/23 1447     Visit Number 1    Date for PT Re-Evaluation 05/15/23    Authorization Type [employer] - VL:30 (PT/OT/Chiro combined)    PT Start Time 1447    PT Stop Time 1531    PT Time Calculation (min) 44 min    Activity Tolerance Patient tolerated treatment well    Behavior During Therapy WFL for tasks assessed/performed             Past Medical History:  Diagnosis Date   Arthritis    BPH (benign prostatic hyperplasia)    Colon polyp    ED (erectile dysfunction)    Elevated liver enzymes    Gallstones    Hepatitis    History of anemia    Hypercholesterolemia    Hypothyroidism    Mild tricuspid regurgitation    Mitral valve regurgitation    Thyroid disease    Vertigo    Past Surgical History:  Procedure Laterality Date   ANTERIOR CERVICAL DECOMP/DISCECTOMY FUSION N/A 07/23/2022   Procedure: Anterior Cervical Discectomy and Fusion Cervical Three-Four/Four-Five/Five-Six;  Surgeon: Vallarie Mare, MD;  Location: [facility];  Service: Neurosurgery;  Laterality: N/A;  3C   FLEXIBLE SIGMOIDOSCOPY     TOTAL HIP ARTHROPLASTY Left 01/06/2023   Procedure: LEFT TOTAL HIP ARTHROPLASTY ANTERIOR APPROACH;  Surgeon: Frederik Pear, MD;  Location: [facility];  Service: Orthopedics;  Laterality: Left;   Patient Active Problem List   Diagnosis Date Noted   UTI (urinary tract infection) 01/24/2023   Sepsis ([place]) 01/24/2023   Bacteremia 01/24/2023   AKI (acute kidney injury) ([place]) 01/23/2023   Arthritis of left hip 01/01/2023   Paresthesia 08/21/2022   Stenosis of cervical spine with myelopathy ([place]) 07/23/2022   Gait abnormality 06/18/2022   Cervical myelopathy ([place]) 06/17/2022   Debility 06/06/2022   Neck pain 06/06/2022   OA (osteoarthritis) of hip 07/24/2021    Immunosuppressed status ([place]) 09/05/2014   Hepatitis, autoimmune ([place]) 09/05/2014    PCP: Lawerance Cruel, MD   REFERRING PROVIDER: Frederik Pear, MD   REFERRING DIAG: M25.559 (ICD-10-CM) - Hip pain   THERAPY DIAG:  Muscle weakness (generalized)  Other abnormalities of gait and mobility  Difficulty in walking, not elsewhere classified  Unsteadiness on feet  Pain in left hip  RATIONALE FOR EVALUATION AND TREATMENT: Rehabilitation  ONSET DATE: 01/06/23 - Anterior L total hip arthroplasty   NEXT MD VISIT: 4 months (saw MD 1 week prior to PT eval)   SUBJECTIVE:  SUBJECTIVE STATEMENT: Pt reports he had his L hip replaced in January.  Completed 6 HH PT for a few weeks and then lost his insurance when his wife was laid off. Minimal to no pain at hip, but notes intermittent recurrence of his UE radiculopathy following the hip surgery and increased LBP at times.  PAIN: Are you having pain? No  PERTINENT HISTORY:  L THA 01/06/23 2 L hip pain - severe OA; ACDF C3-4, C4-5, C5-6 on 07/23/22 2 cervical myelopathy; arthritis; BPH; anemia; thyroid disease - hypothyroidism; MVR & TVR; vertigo; autoimmune hepatitis; HLD  PRECAUTIONS: None  WEIGHT BEARING RESTRICTIONS: No  FALLS:  Has patient fallen in last 6 months? No  LIVING ENVIRONMENT: Lives with: lives with their spouse Lives in: House/apartment Stairs: Yes: Internal: 14 steps; on right going up and External: 3 steps; on left going up Has following equipment at home: Single point cane and Walker - 2 wheeled  OCCUPATION: Retired  PLOF: Independent and Leisure: play golf, gym 5x/wk    PATIENT GOALS: "strength & flexibility"    OBJECTIVE: (objective measures completed at initial evaluation unless otherwise dated)  DIAGNOSTIC  FINDINGS:  01/23/23 - L hip x-ray:  FINDINGS: Status post left total hip arthroplasty. No periprosthetic lucency to suggest loosening. No acute fracture, dislocation or subluxation. Pelvic ring intact.   IMPRESSION: Unremarkable appearance status post left total hip arthroplasty.  01/23/23: Cervical/Thoracic/Lumbar MRI: IMPRESSION: C3-6 ACDF with unchanged severe spinal canal stenosis at C3-4 and focal myelomalacia. Unchanged moderate bilateral C5-6 neural foraminal stenosis. Mild multilevel thoracic degenerative disc disease with mild spinal canal stenosis at T9-10. Unchanged mild spinal canal stenosis at L2-3, L3-4 and L4-5. Unchanged mild right L4-5 neural foraminal stenosis.   PATIENT SURVEYS:  LEFS 38 / 80 = 47.5 %  COGNITION: Overall cognitive status: Within functional limits for tasks assessed    SENSATION: WFL Numbness and tingling in B hands  MUSCLE LENGTH: Hamstrings: mod tight B ITB: mild tight B Piriformis: mod/severe tight B Hip flexors: mod tight B Quads: mod tight B Heelcord: NT  POSTURE:  rounded shoulders and forward head  LOWER EXTREMITY ROM: B hip ROM limited in all motions, knee and ankles WFL  LOWER EXTREMITY MMT:  MMT Right eval Left eval  Hip flexion 4+ 4  Hip extension 3- 2+  Hip abduction 4 2+  Hip adduction 4- 4  Hip internal rotation 4 4  Hip external rotation 4- 2+  Knee flexion 4+ 4  Knee extension 5 5  Ankle dorsiflexion 5 5  Ankle plantarflexion 4  13 SLS HR 4 14 SLS HR  Ankle inversion    Ankle eversion     (Blank rows = not tested)  FUNCTIONAL TESTS:  5 times sit to stand: 18.25 sec Timed up and go (TUG): 12.56 sec 10 meter walk test: 10.19 sec Functional gait assessment: 19/30 ; 19-24 = medium risk fall   GAIT: Distance walked: 80 ft Assistive device utilized: Single point cane and None Level of assistance: Modified independence Gait pattern: step through pattern and trendelenburg Comments: gait speed = 3.22  ft/sec  STAIRS: Level of Assistance: Modified independence Stair Negotiation Technique: Alternating Pattern  with Single Rail on Right Number of Stairs: 14  Height of Stairs: 7  Comments: Hip instability noted   TODAY'S TREATMENT:   03/20/23 Initial eval   PATIENT EDUCATION:  Education details: PT eval findings and anticipated POC Person educated: Patient Education method: Explanation Education comprehension: verbalized understanding  HOME EXERCISE PROGRAM: TBD  ASSESSMENT:  CLINICAL IMPRESSION: Alex West is a 67 y.o. male who was seen today for physical therapy evaluation and treatment s/p L THA via anterior approach on 01/06/23.  He reports minimal to no hip pain but feels like his hip is tightening up and remains very weak.  He notes a sensation of a limp when he walks which appears to be a Trendelenburg hip drop due to weakness in his glutes.  Current deficits include decreased B hip ROM, decreased proximal LE flexibility,  L>R LE weakness most pronounced at hips, gait abnormalities including Trendelenburg gait, and decreased balance with increased fall risk per standardized balance testing with 5xSTS and FGA.  The proximal lower extremity weakness is also contributing to instability in his lumbar spine with resultant increased LBP at times.  Alex "[name]" will benefit from skilled PT to address above deficits to improve mobility and activity tolerance with decreased pain interference and decreased risk for falls.  OBJECTIVE IMPAIRMENTS: Abnormal gait, decreased activity tolerance, decreased balance, decreased endurance, decreased knowledge of condition, decreased mobility, difficulty walking, decreased ROM, decreased strength, decreased safety awareness, hypomobility, increased fascial restrictions, impaired perceived functional ability, impaired flexibility, improper body mechanics, postural dysfunction, and pain.   ACTIVITY LIMITATIONS: carrying, lifting, bending,  sitting, standing, squatting, sleeping, stairs, transfers, bed mobility, locomotion level, and caring for others  PARTICIPATION LIMITATIONS: meal prep, cleaning, laundry, shopping, community activity, and yard work  PERSONAL FACTORS: Fitness, Past/current experiences, Time since onset of injury/illness/exacerbation, and 3+ comorbidities: ACDF C3-4, C4-5, C5-6 on 07/23/22 2 cervical myelopathy; arthritis; BPH; anemia; thyroid disease - hypothyroidism; MVR & TVR; vertigo; autoimmune hepatitis; HLD  are also affecting patient's functional outcome.   REHAB POTENTIAL: Good  CLINICAL DECISION MAKING: Evolving/moderate complexity  EVALUATION COMPLEXITY: Moderate   GOALS: Goals reviewed with patient? Yes  SHORT TERM GOALS: Target date: 04/17/2023   Patient will be independent with initial HEP. Baseline: Currently completing HH PT HEP Goal status: INITIAL  2.  Patient will demonstrate improved B hip strength by grossly 1/2 MMT grade for improved ease of mobility and gait stability. Baseline: Refer to above MMT table Goal status: INITIAL  LONG TERM GOALS: Target date: 05/15/2023   Patient will be independent with advanced/ongoing HEP to improve outcomes and carryover.  Baseline:  Goal status: INITIAL  2.  Patient will demonstrate improved B LE strength to >/= 4 to 4+/5 for improved stability and ease of mobility. Baseline: Refer to above MMT table Goal status: INITIAL  3.  Patient will be able to ambulate 600' with or w/o LRAD and normal gait pattern without evidence of Trendelenburg to safely access community.  Baseline: Trendelenburg gait with SPC Goal status: INITIAL  4.  Patient will negotiate stairs reciprocally with normal step pattern w/o limitation due to L hip pain or instability due to weakness. Baseline:  Goal status: INITIAL   5.  Patient will report </= 29/80 on LEFS to demonstrate improved functional ability. Baseline: 38 / 80 = 47.5 % Goal status: INITIAL  6.   Patient will demonstrate at least 23/30 on FGA to decrease risk of falls. Baseline: 19/30 Goal status: INITIAL   7.  Patient will improve 5x STS time to </= 13.6 seconds to demonstrate improved functional strength and transfer efficiency. Baseline: 18.25 sec Goal status: INITIAL   PLAN:  PT FREQUENCY: 2x/week  PT DURATION: 8 weeks  PLANNED INTERVENTIONS: Therapeutic exercises, Therapeutic activity, Neuromuscular re-education, Balance training, Gait training, Patient/Family education, Self Care, Joint mobilization, Stair training, DME instructions, Dry  Needling, Electrical stimulation, Cryotherapy, Moist heat, scar mobilization, Taping, Ultrasound, Ionotophoresis 4mg /ml Dexamethasone, Manual therapy, and Re-evaluation  PLAN FOR NEXT SESSION: Create initial HEP for proximal lower extremity stretching/flexibility and strengthening; balance training   Percival Spanish, PT 03/20/2023, 6:04 PM

## 2023-03-20 ENCOUNTER — Other Ambulatory Visit: Payer: Self-pay

## 2023-03-20 ENCOUNTER — Encounter: Payer: Self-pay | Admitting: Physical Therapy

## 2023-03-20 ENCOUNTER — Ambulatory Visit: Payer: 59 | Attending: Orthopedic Surgery | Admitting: Physical Therapy

## 2023-03-20 DIAGNOSIS — M25552 Pain in left hip: Secondary | ICD-10-CM | POA: Insufficient documentation

## 2023-03-20 DIAGNOSIS — R2689 Other abnormalities of gait and mobility: Secondary | ICD-10-CM | POA: Diagnosis not present

## 2023-03-20 DIAGNOSIS — R262 Difficulty in walking, not elsewhere classified: Secondary | ICD-10-CM | POA: Diagnosis not present

## 2023-03-20 DIAGNOSIS — M6281 Muscle weakness (generalized): Secondary | ICD-10-CM | POA: Insufficient documentation

## 2023-03-20 DIAGNOSIS — R2681 Unsteadiness on feet: Secondary | ICD-10-CM | POA: Insufficient documentation

## 2023-03-20 DIAGNOSIS — R338 Other retention of urine: Secondary | ICD-10-CM | POA: Diagnosis not present

## 2023-03-27 ENCOUNTER — Ambulatory Visit: Payer: 59 | Attending: Orthopedic Surgery

## 2023-03-27 DIAGNOSIS — M6281 Muscle weakness (generalized): Secondary | ICD-10-CM | POA: Insufficient documentation

## 2023-03-27 DIAGNOSIS — M25552 Pain in left hip: Secondary | ICD-10-CM | POA: Diagnosis not present

## 2023-03-27 DIAGNOSIS — R2681 Unsteadiness on feet: Secondary | ICD-10-CM | POA: Insufficient documentation

## 2023-03-27 DIAGNOSIS — R2689 Other abnormalities of gait and mobility: Secondary | ICD-10-CM | POA: Insufficient documentation

## 2023-03-27 DIAGNOSIS — R262 Difficulty in walking, not elsewhere classified: Secondary | ICD-10-CM | POA: Diagnosis not present

## 2023-03-27 NOTE — Therapy (Signed)
OUTPATIENT PHYSICAL THERAPY TREATMENT  Patient Name: Alex West MRN: [id number] DOB:04-17-1956, 67 y.o., male Today's Date: 03/27/2023   END OF SESSION:  PT End of Session - 03/27/23 0810     Visit Number 2    Date for PT Re-Evaluation 05/15/23    Authorization Type [employer] - VL:30 (PT/OT/Chiro combined)    PT Start Time 0803    PT Stop Time 0846    PT Time Calculation (min) 43 min    Activity Tolerance Patient tolerated treatment well    Behavior During Therapy WFL for tasks assessed/performed              Past Medical History:  Diagnosis Date   Arthritis    BPH (benign prostatic hyperplasia)    Colon polyp    ED (erectile dysfunction)    Elevated liver enzymes    Gallstones    Hepatitis    History of anemia    Hypercholesterolemia    Hypothyroidism    Mild tricuspid regurgitation    Mitral valve regurgitation    Thyroid disease    Vertigo    Past Surgical History:  Procedure Laterality Date   ANTERIOR CERVICAL DECOMP/DISCECTOMY FUSION N/A 07/23/2022   Procedure: Anterior Cervical Discectomy and Fusion Cervical Three-Four/Four-Five/Five-Six;  Surgeon: Vallarie Mare, MD;  Location: [facility];  Service: Neurosurgery;  Laterality: N/A;  3C   FLEXIBLE SIGMOIDOSCOPY     TOTAL HIP ARTHROPLASTY Left 01/06/2023   Procedure: LEFT TOTAL HIP ARTHROPLASTY ANTERIOR APPROACH;  Surgeon: Frederik Pear, MD;  Location: [facility];  Service: Orthopedics;  Laterality: Left;   Patient Active Problem List   Diagnosis Date Noted   UTI (urinary tract infection) 01/24/2023   Sepsis 01/24/2023   Bacteremia 01/24/2023   AKI (acute kidney injury) 01/23/2023   Arthritis of left hip 01/01/2023   Paresthesia 08/21/2022   Stenosis of cervical spine with myelopathy 07/23/2022   Gait abnormality 06/18/2022   Cervical myelopathy 06/17/2022   Debility 06/06/2022   Neck pain 06/06/2022   OA (osteoarthritis) of hip 07/24/2021   Immunosuppressed status 09/05/2014   Hepatitis,  autoimmune 09/05/2014    PCP: Lawerance Cruel, MD   REFERRING PROVIDER: Frederik Pear, MD   REFERRING DIAG: M25.559 (ICD-10-CM) - Hip pain   THERAPY DIAG:  Muscle weakness (generalized)  Other abnormalities of gait and mobility  Difficulty in walking, not elsewhere classified  Unsteadiness on feet  Pain in left hip  RATIONALE FOR EVALUATION AND TREATMENT: Rehabilitation  ONSET DATE: 01/06/23 - Anterior L total hip arthroplasty   NEXT MD VISIT: 4 months (saw MD 1 week prior to PT eval)   SUBJECTIVE:  SUBJECTIVE STATEMENT: Pt just notes stiffness this morning.  PAIN: Are you having pain? No  PERTINENT HISTORY:  L THA 01/06/23 2 L hip pain - severe OA; ACDF C3-4, C4-5, C5-6 on 07/23/22 2 cervical myelopathy; arthritis; BPH; anemia; thyroid disease - hypothyroidism; MVR & TVR; vertigo; autoimmune hepatitis; HLD  PRECAUTIONS: None  WEIGHT BEARING RESTRICTIONS: No  FALLS:  Has patient fallen in last 6 months? No  LIVING ENVIRONMENT: Lives with: lives with their spouse Lives in: House/apartment Stairs: Yes: Internal: 14 steps; on right going up and External: 3 steps; on left going up Has following equipment at home: Single point cane and Walker - 2 wheeled  OCCUPATION: Retired  PLOF: Independent and Leisure: play golf, gym 5x/wk    PATIENT GOALS: "strength & flexibility"    OBJECTIVE: (objective measures completed at initial evaluation unless otherwise dated)  DIAGNOSTIC FINDINGS:  01/23/23 - L hip x-ray:  FINDINGS: Status post left total hip arthroplasty. No periprosthetic lucency to suggest loosening. No acute fracture, dislocation or subluxation. Pelvic ring intact.   IMPRESSION: Unremarkable appearance status post left total hip arthroplasty.  01/23/23:  Cervical/Thoracic/Lumbar MRI: IMPRESSION: C3-6 ACDF with unchanged severe spinal canal stenosis at C3-4 and focal myelomalacia. Unchanged moderate bilateral C5-6 neural foraminal stenosis. Mild multilevel thoracic degenerative disc disease with mild spinal canal stenosis at T9-10. Unchanged mild spinal canal stenosis at L2-3, L3-4 and L4-5. Unchanged mild right L4-5 neural foraminal stenosis.   PATIENT SURVEYS:  LEFS 38 / 80 = 47.5 %  COGNITION: Overall cognitive status: Within functional limits for tasks assessed    SENSATION: WFL Numbness and tingling in B hands  MUSCLE LENGTH: Hamstrings: mod tight B ITB: mild tight B Piriformis: mod/severe tight B Hip flexors: mod tight B Quads: mod tight B Heelcord: NT  POSTURE:  rounded shoulders and forward head  LOWER EXTREMITY ROM: B hip ROM limited in all motions, knee and ankles WFL  LOWER EXTREMITY MMT:  MMT Right eval Left eval  Hip flexion 4+ 4  Hip extension 3- 2+  Hip abduction 4 2+  Hip adduction 4- 4  Hip internal rotation 4 4  Hip external rotation 4- 2+  Knee flexion 4+ 4  Knee extension 5 5  Ankle dorsiflexion 5 5  Ankle plantarflexion 4  13 SLS HR 4 14 SLS HR  Ankle inversion    Ankle eversion     (Blank rows = not tested)  FUNCTIONAL TESTS:  5 times sit to stand: 18.25 sec Timed up and go (TUG): 12.56 sec 10 meter walk test: 10.19 sec Functional gait assessment: 19/30 ; 19-24 = medium risk fall   GAIT: Distance walked: 80 ft Assistive device utilized: Single point cane and None Level of assistance: Modified independence Gait pattern: step through pattern and trendelenburg Comments: gait speed = 3.22 ft/sec  STAIRS: Level of Assistance: Modified independence Stair Negotiation Technique: Alternating Pattern  with Single Rail on Right Number of Stairs: 14  Height of Stairs: 7  Comments: Hip instability noted   TODAY'S TREATMENT:  03/27/23 Therapeutic Exercise: to improve strength and  mobility.  Demo, verbal and tactile cues throughout for technique.  Nustep L3x73min Supine KTOS stretch x 30 sec bil Supine hamstring stretch x 30 sec bil Supine quad stretch x 30 sec bil Bridges with glute set x 10 Single leg bridge B con with opp leg kick out x 10 S/L clams L side 2x10- cues to keep hips from rolling back Prone hip extension x 10 bil  03/20/23 Initial eval  PATIENT EDUCATION:  Education details: PT eval findings and anticipated POC Person educated: Patient Education method: Explanation Education comprehension: verbalized understanding  HOME EXERCISE PROGRAM: Access Code: [email or web address] URL: [email or web address] Date: 03/27/2023 Prepared by: Clarene Essex  Exercises - Supine Hamstring Stretch with Strap  - 1 x daily - 7 x weekly - 3 sets - 30 sec hold - Supine Quadriceps Stretch with Strap on Table  - 1 x daily - 7 x weekly - 3 sets - 30 sec hold - Supine Bridge with Knee Extension and Pelvic Floor Contraction  - 1 x daily - 7 x weekly - 3 sets - 10 reps - Clamshell  - 1 x daily - 7 x weekly - 3 sets - 10 reps - Prone Hip Extension  - 1 x daily - 7 x weekly - 3 sets - 10 reps   ASSESSMENT:  CLINICAL IMPRESSION: [name] demonstrates tightness in quads, hamstrings, and glutes. He demonstrated weakness in gluteal muscles with exercises as seen with compensation. He needs cues with clamshells to avoid rotating hips as well as in prone with hip extension. Cues required with bridges and knee extension to keep hip up instead of dropping. He would continue to benefit from glute focused strengthening for increased stability of the hip.    OBJECTIVE IMPAIRMENTS: Abnormal gait, decreased activity tolerance, decreased balance, decreased endurance, decreased knowledge of condition, decreased mobility, difficulty walking, decreased ROM, decreased strength, decreased safety awareness, hypomobility, increased fascial restrictions, impaired perceived functional  ability, impaired flexibility, improper body mechanics, postural dysfunction, and pain.   ACTIVITY LIMITATIONS: carrying, lifting, bending, sitting, standing, squatting, sleeping, stairs, transfers, bed mobility, locomotion level, and caring for others  PARTICIPATION LIMITATIONS: meal prep, cleaning, laundry, shopping, community activity, and yard work  PERSONAL FACTORS: Fitness, Past/current experiences, Time since onset of injury/illness/exacerbation, and 3+ comorbidities: ACDF C3-4, C4-5, C5-6 on 07/23/22 2 cervical myelopathy; arthritis; BPH; anemia; thyroid disease - hypothyroidism; MVR & TVR; vertigo; autoimmune hepatitis; HLD  are also affecting patient's functional outcome.   REHAB POTENTIAL: Good  CLINICAL DECISION MAKING: Evolving/moderate complexity  EVALUATION COMPLEXITY: Moderate   GOALS: Goals reviewed with patient? Yes  SHORT TERM GOALS: Target date: 04/17/2023   Patient will be independent with initial HEP. Baseline: Currently completing HH PT HEP Goal status: IN PROGRESS  2.  Patient will demonstrate improved B hip strength by grossly 1/2 MMT grade for improved ease of mobility and gait stability. Baseline: Refer to above MMT table Goal status: IN PROGRESS  LONG TERM GOALS: Target date: 05/15/2023   Patient will be independent with advanced/ongoing HEP to improve outcomes and carryover.  Baseline:  Goal status: IN PROGRESS  2.  Patient will demonstrate improved B LE strength to >/= 4 to 4+/5 for improved stability and ease of mobility. Baseline: Refer to above MMT table Goal status: IN PROGRESS  3.  Patient will be able to ambulate 600' with or w/o LRAD and normal gait pattern without evidence of Trendelenburg to safely access community.  Baseline: Trendelenburg gait with SPC Goal status: IN PROGRESS  4.  Patient will negotiate stairs reciprocally with normal step pattern w/o limitation due to L hip pain or instability due to weakness. Baseline:  Goal status:  IN PROGRESS   5.  Patient will report </= 29/80 on LEFS to demonstrate improved functional ability. Baseline: 38 / 80 = 47.5 % Goal status: IN PROGRESS  6.  Patient will demonstrate at least 23/30 on FGA to decrease risk of falls. Baseline: 19/30 Goal status: IN PROGRESS  7.  Patient will improve 5x STS time to </= 13.6 seconds to demonstrate improved functional strength and transfer efficiency. Baseline: 18.25 sec Goal status: IN PROGRESS   PLAN:  PT FREQUENCY: 2x/week  PT DURATION: 8 weeks  PLANNED INTERVENTIONS: Therapeutic exercises, Therapeutic activity, Neuromuscular re-education, Balance training, Gait training, Patient/Family education, Self Care, Joint mobilization, Stair training, DME instructions, Dry Needling, Electrical stimulation, Cryotherapy, Moist heat, scar mobilization, Taping, Ultrasound, Ionotophoresis 4mg /ml Dexamethasone, Manual therapy, and Re-evaluation  PLAN FOR NEXT SESSION: Review HEP if needed; continue glute strengthening; balance training   Artist Pais, PTA 03/27/2023, 8:48 AM

## 2023-04-02 ENCOUNTER — Ambulatory Visit: Payer: 59

## 2023-04-02 DIAGNOSIS — M6281 Muscle weakness (generalized): Secondary | ICD-10-CM | POA: Diagnosis not present

## 2023-04-02 DIAGNOSIS — M25552 Pain in left hip: Secondary | ICD-10-CM | POA: Diagnosis not present

## 2023-04-02 DIAGNOSIS — R2681 Unsteadiness on feet: Secondary | ICD-10-CM | POA: Diagnosis not present

## 2023-04-02 DIAGNOSIS — R262 Difficulty in walking, not elsewhere classified: Secondary | ICD-10-CM

## 2023-04-02 DIAGNOSIS — R2689 Other abnormalities of gait and mobility: Secondary | ICD-10-CM | POA: Diagnosis not present

## 2023-04-02 NOTE — Therapy (Signed)
OUTPATIENT PHYSICAL THERAPY TREATMENT  Patient Name: Kaiyon Birke MRN: [id number] DOB:1956-07-19, 67 y.o., male Today's Date: 04/02/2023   END OF SESSION:  PT End of Session - 04/02/23 0854     Visit Number 3    Date for PT Re-Evaluation 05/15/23    Authorization Type [employer] - [id number] (PT/OT/Chiro combined)    PT Start Time 0850    PT Stop Time 0932    PT Time Calculation (min) 42 min    Activity Tolerance Patient tolerated treatment well    Behavior During Therapy WFL for tasks assessed/performed               Past Medical History:  Diagnosis Date   Arthritis    BPH (benign prostatic hyperplasia)    Colon polyp    ED (erectile dysfunction)    Elevated liver enzymes    Gallstones    Hepatitis    History of anemia    Hypercholesterolemia    Hypothyroidism    Mild tricuspid regurgitation    Mitral valve regurgitation    Thyroid disease    Vertigo    Past Surgical History:  Procedure Laterality Date   ANTERIOR CERVICAL DECOMP/DISCECTOMY FUSION N/A 07/23/2022   Procedure: Anterior Cervical Discectomy and Fusion Cervical Three-Four/Four-Five/Five-Six;  Surgeon: Bedelia Person, MD;  [facility];  Service: Neurosurgery;  Laterality: N/A;  3C   FLEXIBLE SIGMOIDOSCOPY     TOTAL HIP ARTHROPLASTY Left 01/06/2023   Procedure: LEFT TOTAL HIP ARTHROPLASTY ANTERIOR APPROACH;  Surgeon: Gean Birchwood, MD;  Location: [facility] ORS;  Service: Orthopedics;  Laterality: Left;   Patient Active Problem List   Diagnosis Date Noted   UTI (urinary tract infection) 01/24/2023   Sepsis 01/24/2023   Bacteremia 01/24/2023   AKI (acute kidney injury) 01/23/2023   Arthritis of left hip 01/01/2023   Paresthesia 08/21/2022   Stenosis of cervical spine with myelopathy 07/23/2022   Gait abnormality 06/18/2022   Cervical myelopathy 06/17/2022   Debility 06/06/2022   Neck pain 06/06/2022   OA (osteoarthritis) of hip 07/24/2021   Immunosuppressed status 09/05/2014   Hepatitis,  autoimmune 09/05/2014    PCP: Daisy Floro, MD   REFERRING PROVIDER: Gean Birchwood, MD   REFERRING DIAG: M25.559 (ICD-10-CM) - Hip pain   THERAPY DIAG:  Muscle weakness (generalized)  Other abnormalities of gait and mobility  Difficulty in walking, not elsewhere classified  Unsteadiness on feet  Pain in left hip  RATIONALE FOR EVALUATION AND TREATMENT: Rehabilitation  ONSET DATE: 01/06/23 - Anterior L total hip arthroplasty   NEXT MD VISIT: 4 months (saw MD 1 week prior to PT eval)   SUBJECTIVE:  SUBJECTIVE STATEMENT: Just stiff today wanting to try some more gym machines that he can do on his own.   PAIN: Are you having pain? No  PERTINENT HISTORY:  L THA 01/06/23 2 L hip pain - severe OA; ACDF C3-4, C4-5, C5-6 on 07/23/22 2 cervical myelopathy; arthritis; BPH; anemia; thyroid disease - hypothyroidism; MVR & TVR; vertigo; autoimmune hepatitis; HLD  PRECAUTIONS: None  WEIGHT BEARING RESTRICTIONS: No  FALLS:  Has patient fallen in last 6 months? No  LIVING ENVIRONMENT: Lives with: lives with their spouse Lives in: House/apartment Stairs: Yes: Internal: 14 steps; on right going up and External: 3 steps; on left going up Has following equipment at home: Single point cane and Walker - 2 wheeled  OCCUPATION: Retired  PLOF: Independent and Leisure: play golf, gym 5x/wk    PATIENT GOALS: "strength & flexibility"    OBJECTIVE: (objective measures completed at initial evaluation unless otherwise dated)  DIAGNOSTIC FINDINGS:  01/23/23 - L hip x-ray:  FINDINGS: Status post left total hip arthroplasty. No periprosthetic lucency to suggest loosening. No acute fracture, dislocation or subluxation. Pelvic ring intact.   IMPRESSION: Unremarkable appearance status post  left total hip arthroplasty.  01/23/23: Cervical/Thoracic/Lumbar MRI: IMPRESSION: C3-6 ACDF with unchanged severe spinal canal stenosis at C3-4 and focal myelomalacia. Unchanged moderate bilateral C5-6 neural foraminal stenosis. Mild multilevel thoracic degenerative disc disease with mild spinal canal stenosis at T9-10. Unchanged mild spinal canal stenosis at L2-3, L3-4 and L4-5. Unchanged mild right L4-5 neural foraminal stenosis.   PATIENT SURVEYS:  LEFS 38 / 80 = 47.5 %  COGNITION: Overall cognitive status: Within functional limits for tasks assessed    SENSATION: WFL Numbness and tingling in B hands  MUSCLE LENGTH: Hamstrings: mod tight B ITB: mild tight B Piriformis: mod/severe tight B Hip flexors: mod tight B Quads: mod tight B Heelcord: NT  POSTURE:  rounded shoulders and forward head  LOWER EXTREMITY ROM: B hip ROM limited in all motions, knee and ankles WFL  LOWER EXTREMITY MMT:  MMT Right eval Left eval  Hip flexion 4+ 4  Hip extension 3- 2+  Hip abduction 4 2+  Hip adduction 4- 4  Hip internal rotation 4 4  Hip external rotation 4- 2+  Knee flexion 4+ 4  Knee extension 5 5  Ankle dorsiflexion 5 5  Ankle plantarflexion 4  13 SLS HR 4 14 SLS HR  Ankle inversion    Ankle eversion     (Blank rows = not tested)  FUNCTIONAL TESTS:  5 times sit to stand: 18.25 sec Timed up and go (TUG): 12.56 sec 10 meter walk test: 10.19 sec Functional gait assessment: 19/30 ; 19-24 = medium risk fall   GAIT: Distance walked: 80 ft Assistive device utilized: Single point cane and None Level of assistance: Modified independence Gait pattern: step through pattern and trendelenburg Comments: gait speed = 3.22 ft/sec  STAIRS: Level of Assistance: Modified independence Stair Negotiation Technique: Alternating Pattern  with Single Rail on Right Number of Stairs: 14  Height of Stairs: 7  Comments: Hip instability noted   TODAY'S TREATMENT:   04/02/23 Therapeutic Exercise: to improve strength and mobility.  Demo, verbal and tactile cues throughout for technique.  Nustep L5x596min Single leg bridges B con with opp leg kick out x 10 bil S/L clams R/L x 10 bil Prone hip extension x 10 bil Seated hamstring stretch hip hinge x 30 sec bil Leg press 25# x 20 bil; 15# x 20 R/L Knee flexion 20# x 20  bil; 10# x 20 R/L Single leg deadlift x 10 with 1 hand support  03/27/23 Therapeutic Exercise: to improve strength and mobility.  Demo, verbal and tactile cues throughout for technique.  Nustep L3x23min Supine KTOS stretch x 30 sec bil Supine hamstring stretch x 30 sec bil Supine quad stretch x 30 sec bil Bridges with glute set x 10 Single leg bridge B con with opp leg kick out x 10 S/L clams L side 2x10- cues to keep hips from rolling back Prone hip extension x 10 bil  03/20/23 Initial eval   PATIENT EDUCATION:  Education details: PT eval findings and anticipated POC Person educated: Patient Education method: Explanation Education comprehension: verbalized understanding  HOME EXERCISE PROGRAM: Access Code: [email or web address] URL: [email or web address] Date: 04/02/2023 Prepared by: Verta Ellen  Exercises - Supine Quadriceps Stretch with Strap on Table  - 1 x daily - 7 x weekly - 3 sets - 30 sec hold - Supine Bridge with Knee Extension and Pelvic Floor Contraction  - 1 x daily - 7 x weekly - 3 sets - 10 reps - Clamshell  - 1 x daily - 7 x weekly - 3 sets - 10 reps - Prone Hip Extension  - 1 x daily - 7 x weekly - 3 sets - 10 reps - Seated Hamstring Stretch  - 1 x daily - 7 x weekly - 3 sets - 30 sec hold - Forward T with Counter Support  - 1 x daily - 7 x weekly - 3 sets - 10 reps   ASSESSMENT:  CLINICAL IMPRESSION: Dwayne had reports of nerve type of pain in his R hamstring today. He had questions about doing leg press and hamstring curls at gym to strengthen his hips and we reviewed these machines today. Also  reviewed HEP given form last session with patient still showing some challenge with them. Switched supine HS stretch with seated HS stretch for HEP.   OBJECTIVE IMPAIRMENTS: Abnormal gait, decreased activity tolerance, decreased balance, decreased endurance, decreased knowledge of condition, decreased mobility, difficulty walking, decreased ROM, decreased strength, decreased safety awareness, hypomobility, increased fascial restrictions, impaired perceived functional ability, impaired flexibility, improper body mechanics, postural dysfunction, and pain.   ACTIVITY LIMITATIONS: carrying, lifting, bending, sitting, standing, squatting, sleeping, stairs, transfers, bed mobility, locomotion level, and caring for others  PARTICIPATION LIMITATIONS: meal prep, cleaning, laundry, shopping, community activity, and yard work  PERSONAL FACTORS: Fitness, Past/current experiences, Time since onset of injury/illness/exacerbation, and 3+ comorbidities: ACDF C3-4, C4-5, C5-6 on 07/23/22 2 cervical myelopathy; arthritis; BPH; anemia; thyroid disease - hypothyroidism; MVR & TVR; vertigo; autoimmune hepatitis; HLD  are also affecting patient's functional outcome.   REHAB POTENTIAL: Good  CLINICAL DECISION MAKING: Evolving/moderate complexity  EVALUATION COMPLEXITY: Moderate   GOALS: Goals reviewed with patient? Yes  SHORT TERM GOALS: Target date: 04/17/2023   Patient will be independent with initial HEP. Baseline: Currently completing HH PT HEP Goal status: IN PROGRESS  2.  Patient will demonstrate improved B hip strength by grossly 1/2 MMT grade for improved ease of mobility and gait stability. Baseline: Refer to above MMT table Goal status: IN PROGRESS  LONG TERM GOALS: Target date: 05/15/2023   Patient will be independent with advanced/ongoing HEP to improve outcomes and carryover.  Baseline:  Goal status: IN PROGRESS  2.  Patient will demonstrate improved B LE strength to >/= 4 to 4+/5 for improved  stability and ease of mobility. Baseline: Refer to above MMT table Goal status: IN PROGRESS  3.  Patient will  be able to ambulate 600' with or w/o LRAD and normal gait pattern without evidence of Trendelenburg to safely access community.  Baseline: Trendelenburg gait with SPC Goal status: IN PROGRESS  4.  Patient will negotiate stairs reciprocally with normal step pattern w/o limitation due to L hip pain or instability due to weakness. Baseline:  Goal status: IN PROGRESS   5.  Patient will report </= 29/80 on LEFS to demonstrate improved functional ability. Baseline: 38 / 80 = 47.5 % Goal status: IN PROGRESS  6.  Patient will demonstrate at least 23/30 on FGA to decrease risk of falls. Baseline: 19/30 Goal status: IN PROGRESS   7.  Patient will improve 5x STS time to </= 13.6 seconds to demonstrate improved functional strength and transfer efficiency. Baseline: 18.25 sec Goal status: IN PROGRESS   PLAN:  PT FREQUENCY: 2x/week  PT DURATION: 8 weeks  PLANNED INTERVENTIONS: Therapeutic exercises, Therapeutic activity, Neuromuscular re-education, Balance training, Gait training, Patient/Family education, Self Care, Joint mobilization, Stair training, DME instructions, Dry Needling, Electrical stimulation, Cryotherapy, Moist heat, scar mobilization, Taping, Ultrasound, Ionotophoresis 4mg /ml Dexamethasone, Manual therapy, and Re-evaluation  PLAN FOR NEXT SESSION: continue glute strengthening; balance training   Darleene Cleaver, PTA 04/02/2023, 10:09 AM

## 2023-04-07 ENCOUNTER — Encounter: Payer: Self-pay | Admitting: *Deleted

## 2023-04-09 ENCOUNTER — Encounter: Payer: Self-pay | Admitting: Physical Therapy

## 2023-04-09 ENCOUNTER — Ambulatory Visit: Payer: 59 | Admitting: Physical Therapy

## 2023-04-09 DIAGNOSIS — M25552 Pain in left hip: Secondary | ICD-10-CM

## 2023-04-09 DIAGNOSIS — R2681 Unsteadiness on feet: Secondary | ICD-10-CM | POA: Diagnosis not present

## 2023-04-09 DIAGNOSIS — R262 Difficulty in walking, not elsewhere classified: Secondary | ICD-10-CM | POA: Diagnosis not present

## 2023-04-09 DIAGNOSIS — M6281 Muscle weakness (generalized): Secondary | ICD-10-CM | POA: Diagnosis not present

## 2023-04-09 DIAGNOSIS — R2689 Other abnormalities of gait and mobility: Secondary | ICD-10-CM

## 2023-04-09 NOTE — Therapy (Signed)
OUTPATIENT PHYSICAL THERAPY TREATMENT  Patient Name: Alex West MRN: [id number] DOB:11/02/1956, 67 y.o., male Today's Date: 04/09/2023   END OF SESSION:  PT End of Session - 04/09/23 0845     Visit Number 4    Date for PT Re-Evaluation 05/15/23    Authorization Type [employer] - [id number] (PT/OT/Chiro combined)    PT Start Time 0845    PT Stop Time 0930    PT Time Calculation (min) 45 min    Activity Tolerance Patient tolerated treatment well    Behavior During Therapy WFL for tasks assessed/performed               Past Medical History:  Diagnosis Date   Arthritis    BPH (benign prostatic hyperplasia)    Colon polyp    ED (erectile dysfunction)    Elevated liver enzymes    Gallstones    Hepatitis    History of anemia    Hypercholesterolemia    Hypothyroidism    Mild tricuspid regurgitation    Mitral valve regurgitation    Thyroid disease    Vertigo    Past Surgical History:  Procedure Laterality Date   ANTERIOR CERVICAL DECOMP/DISCECTOMY FUSION N/A 07/23/2022   Procedure: Anterior Cervical Discectomy and Fusion Cervical Three-Four/Four-Five/Five-Six;  Surgeon: Bedelia Person, MD;  Location: [facility] OR;  Service: Neurosurgery;  Laterality: N/A;  3C   FLEXIBLE SIGMOIDOSCOPY     TOTAL HIP ARTHROPLASTY Left 01/06/2023   Procedure: LEFT TOTAL HIP ARTHROPLASTY ANTERIOR APPROACH;  Surgeon: Gean Birchwood, MD;  Location: [facility] ORS;  Service: Orthopedics;  Laterality: Left;   Patient Active Problem List   Diagnosis Date Noted   UTI (urinary tract infection) 01/24/2023   Sepsis 01/24/2023   Bacteremia 01/24/2023   AKI (acute kidney injury) 01/23/2023   Arthritis of left hip 01/01/2023   Paresthesia 08/21/2022   Stenosis of cervical spine with myelopathy 07/23/2022   Gait abnormality 06/18/2022   Cervical myelopathy 06/17/2022   Debility 06/06/2022   Neck pain 06/06/2022   OA (osteoarthritis) of hip 07/24/2021   Immunosuppressed status 09/05/2014   Hepatitis,  autoimmune 09/05/2014    PCP: Daisy Floro, MD   REFERRING PROVIDER: Gean Birchwood, MD   REFERRING DIAG: M25.559 (ICD-10-CM) - Hip pain   THERAPY DIAG:  Muscle weakness (generalized)  Other abnormalities of gait and mobility  Difficulty in walking, not elsewhere classified  Unsteadiness on feet  Pain in left hip  RATIONALE FOR EVALUATION AND TREATMENT: Rehabilitation  ONSET DATE: 01/06/23 - Anterior L total hip arthroplasty   NEXT MD VISIT: 4 months (saw MD 1 week prior to PT eval)   SUBJECTIVE:  SUBJECTIVE STATEMENT: Pt reports he is still stiff especially in the mornings, but has been consciously stretching.  PAIN: Are you having pain? No  PERTINENT HISTORY:  L THA 01/06/23 2 L hip pain - severe OA; ACDF C3-4, C4-5, C5-6 on 07/23/22 2 cervical myelopathy; arthritis; BPH; anemia; thyroid disease - hypothyroidism; MVR & TVR; vertigo; autoimmune hepatitis; HLD  PRECAUTIONS: None  WEIGHT BEARING RESTRICTIONS: No  FALLS:  Has patient fallen in last 6 months? No  LIVING ENVIRONMENT: Lives with: lives with their spouse Lives in: House/apartment Stairs: Yes: Internal: 14 steps; on right going up and External: 3 steps; on left going up Has following equipment at home: Single point cane and Walker - 2 wheeled  OCCUPATION: Retired  PLOF: Independent and Leisure: play golf, gym 5x/wk    PATIENT GOALS: "strength & flexibility"    OBJECTIVE: (objective measures completed at initial evaluation unless otherwise dated)  DIAGNOSTIC FINDINGS:  01/23/23 - L hip x-ray:  FINDINGS: Status post left total hip arthroplasty. No periprosthetic lucency to suggest loosening. No acute fracture, dislocation or subluxation. Pelvic ring intact.   IMPRESSION: Unremarkable appearance  status post left total hip arthroplasty.  01/23/23: Cervical/Thoracic/Lumbar MRI: IMPRESSION: C3-6 ACDF with unchanged severe spinal canal stenosis at C3-4 and focal myelomalacia. Unchanged moderate bilateral C5-6 neural foraminal stenosis. Mild multilevel thoracic degenerative disc disease with mild spinal canal stenosis at T9-10. Unchanged mild spinal canal stenosis at L2-3, L3-4 and L4-5. Unchanged mild right L4-5 neural foraminal stenosis.   PATIENT SURVEYS:  LEFS 38 / 80 = 47.5 %  COGNITION: Overall cognitive status: Within functional limits for tasks assessed    SENSATION: WFL Numbness and tingling in B hands  MUSCLE LENGTH: Hamstrings: mod tight B ITB: mild tight B Piriformis: mod/severe tight B Hip flexors: mod tight B Quads: mod tight B Heelcord: NT  POSTURE:  rounded shoulders and forward head  LOWER EXTREMITY ROM: B hip ROM limited in all motions, knee and ankles WFL  LOWER EXTREMITY MMT:  MMT Right eval Left eval  Hip flexion 4+ 4  Hip extension 3- 2+  Hip abduction 4 2+  Hip adduction 4- 4  Hip internal rotation 4 4  Hip external rotation 4- 2+  Knee flexion 4+ 4  Knee extension 5 5  Ankle dorsiflexion 5 5  Ankle plantarflexion 4  13 SLS HR 4 14 SLS HR  Ankle inversion    Ankle eversion     (Blank rows = not tested)  FUNCTIONAL TESTS:  5 times sit to stand: 18.25 sec Timed up and go (TUG): 12.56 sec 10 meter walk test: 10.19 sec Functional gait assessment: 19/30 ; 19-24 = medium risk fall   GAIT: Distance walked: 80 ft Assistive device utilized: Single point cane and None Level of assistance: Modified independence Gait pattern: step through pattern and trendelenburg Comments: gait speed = 3.22 ft/sec  STAIRS: Level of Assistance: Modified independence Stair Negotiation Technique: Alternating Pattern  with Single Rail on Right Number of Stairs: 14  Height of Stairs: 7  Comments: Hip instability noted   TODAY'S TREATMENT:    04/09/23 THERAPEUTIC EXERCISE: to improve flexibility, strength and mobility.  Verbal and tactile cues throughout for technique.  Rec Bike - L3 x 6 min Standing RTB 4-way SLR x10 bil - UE support on back of chair for balance, cues for upright posture avoiding accessory trunk motion Seated GTB L HS curl 2 x 10 Counter squat x 25 - cues to keep torso upright B heel raises + quad/glute/TrA  isometric set x 25 B lateral step-up/down on 6" step x 20 R/L lateral eccentric lowering from 6" step with light tap to floor x 15 - cues to maintain level pelvis R/L SLS + 5-way tap to colored dots x 5 cycles on each leg    04/02/23 Therapeutic Exercise: to improve strength and mobility.  Demo, verbal and tactile cues throughout for technique.  Nustep L5x40min Single leg bridges B con with opp leg kick out x 10 bil S/L clams R/L x 10 bil Prone hip extension x 10 bil Seated hamstring stretch hip hinge x 30 sec bil Leg press 25# x 20 bil; 15# x 20 R/L Knee flexion 20# x 20 bil; 10# x 20 R/L Single leg deadlift x 10 with 1 hand support   03/27/23 Therapeutic Exercise: to improve strength and mobility.  Demo, verbal and tactile cues throughout for technique.  Nustep L3x28min Supine KTOS stretch x 30 sec bil Supine hamstring stretch x 30 sec bil Supine quad stretch x 30 sec bil Bridges with glute set x 10 Single leg bridge B con with opp leg kick out x 10 S/L clams L side 2x10- cues to keep hips from rolling back Prone hip extension x 10 bil   PATIENT EDUCATION:  Education details: ongoing PT POC Person educated: Patient Education method: Explanation Education comprehension: verbalized understanding  HOME EXERCISE PROGRAM: Access Code: [email or web address] URL: [email or web address] Date: 04/02/2023 Prepared by: Verta Ellen  Exercises - Supine Quadriceps Stretch with Strap on Table  - 1 x daily - 7 x weekly - 3 sets - 30 sec hold - Supine Bridge with Knee Extension and Pelvic Floor  Contraction  - 1 x daily - 7 x weekly - 3 sets - 10 reps - Clamshell  - 1 x daily - 7 x weekly - 3 sets - 10 reps - Prone Hip Extension  - 1 x daily - 7 x weekly - 3 sets - 10 reps - Seated Hamstring Stretch  - 1 x daily - 7 x weekly - 3 sets - 30 sec hold - Forward T with Counter Support  - 1 x daily - 7 x weekly - 3 sets - 10 reps   ASSESSMENT:  CLINICAL IMPRESSION: Akiel "[name]" reports the HEP is going well and the stretches really seem to help. He denies need for review of HEP at this time - STG #1 met. Progressed proximal LE strengthening with incorporation of stability/balance activities in standing to promote increased gait stability and endurance with decreased fall risk. Solid UE support required for most exercises but able to complete 5-way star exercise with only SPC support. Giovanni "[name]" will benefit from skilled PT to address ongoing strength and balance deficits to improve mobility and activity tolerance with decreased pain interference and decreased risk for falls.   OBJECTIVE IMPAIRMENTS: Abnormal gait, decreased activity tolerance, decreased balance, decreased endurance, decreased knowledge of condition, decreased mobility, difficulty walking, decreased ROM, decreased strength, decreased safety awareness, hypomobility, increased fascial restrictions, impaired perceived functional ability, impaired flexibility, improper body mechanics, postural dysfunction, and pain.   ACTIVITY LIMITATIONS: carrying, lifting, bending, sitting, standing, squatting, sleeping, stairs, transfers, bed mobility, locomotion level, and caring for others  PARTICIPATION LIMITATIONS: meal prep, cleaning, laundry, shopping, community activity, and yard work  PERSONAL FACTORS: Fitness, Past/current experiences, Time since onset of injury/illness/exacerbation, and 3+ comorbidities: ACDF C3-4, C4-5, C5-6 on 07/23/22 2 cervical myelopathy; arthritis; BPH; anemia; thyroid disease - hypothyroidism; MVR & TVR;  vertigo; autoimmune hepatitis; HLD  are also affecting  patient's functional outcome.   REHAB POTENTIAL: Good  CLINICAL DECISION MAKING: Evolving/moderate complexity  EVALUATION COMPLEXITY: Moderate   GOALS: Goals reviewed with patient? Yes  SHORT TERM GOALS: Target date: 04/17/2023   Patient will be independent with initial HEP. Baseline: Currently completing HH PT HEP Goal status: MET  04/09/23  2.  Patient will demonstrate improved B hip strength by grossly 1/2 MMT grade for improved ease of mobility and gait stability. Baseline: Refer to above MMT table Goal status: IN PROGRESS  LONG TERM GOALS: Target date: 05/15/2023   Patient will be independent with advanced/ongoing HEP to improve outcomes and carryover.  Baseline:  Goal status: IN PROGRESS  2.  Patient will demonstrate improved B LE strength to >/= 4 to 4+/5 for improved stability and ease of mobility. Baseline: Refer to above MMT table Goal status: IN PROGRESS  3.  Patient will be able to ambulate 600' with or w/o LRAD and normal gait pattern without evidence of Trendelenburg to safely access community.  Baseline: Trendelenburg gait with SPC Goal status: IN PROGRESS  4.  Patient will negotiate stairs reciprocally with normal step pattern w/o limitation due to L hip pain or instability due to weakness. Baseline:  Goal status: IN PROGRESS   5.  Patient will report </= 29/80 on LEFS to demonstrate improved functional ability. Baseline: 38 / 80 = 47.5 % Goal status: IN PROGRESS  6.  Patient will demonstrate at least 23/30 on FGA to decrease risk of falls. Baseline: 19/30 Goal status: IN PROGRESS   7.  Patient will improve 5x STS time to </= 13.6 seconds to demonstrate improved functional strength and transfer efficiency. Baseline: 18.25 sec Goal status: IN PROGRESS   PLAN:  PT FREQUENCY: 2x/week  PT DURATION: 8 weeks  PLANNED INTERVENTIONS: Therapeutic exercises, Therapeutic activity, Neuromuscular  re-education, Balance training, Gait training, Patient/Family education, Self Care, Joint mobilization, Stair training, DME instructions, Dry Needling, Electrical stimulation, Cryotherapy, Moist heat, scar mobilization, Taping, Ultrasound, Ionotophoresis 4mg /ml Dexamethasone, Manual therapy, and Re-evaluation  PLAN FOR NEXT SESSION: continue glute and proximal LE strengthening; balance training   Marry Guan, PT 04/09/2023, 9:39 AM

## 2023-04-11 ENCOUNTER — Ambulatory Visit: Payer: 59 | Admitting: Physical Therapy

## 2023-04-15 ENCOUNTER — Ambulatory Visit: Payer: 59

## 2023-04-15 DIAGNOSIS — M25552 Pain in left hip: Secondary | ICD-10-CM | POA: Diagnosis not present

## 2023-04-15 DIAGNOSIS — R262 Difficulty in walking, not elsewhere classified: Secondary | ICD-10-CM | POA: Diagnosis not present

## 2023-04-15 DIAGNOSIS — R2689 Other abnormalities of gait and mobility: Secondary | ICD-10-CM | POA: Diagnosis not present

## 2023-04-15 DIAGNOSIS — M6281 Muscle weakness (generalized): Secondary | ICD-10-CM | POA: Diagnosis not present

## 2023-04-15 DIAGNOSIS — R2681 Unsteadiness on feet: Secondary | ICD-10-CM

## 2023-04-15 NOTE — Therapy (Signed)
OUTPATIENT PHYSICAL THERAPY TREATMENT  Patient Name: Alex West MRN: [id number] DOB:1956-04-19, 67 y.o., male Today's Date: 04/15/2023   END OF SESSION:  PT End of Session - 04/15/23 0917     Visit Number 5    Date for PT Re-Evaluation 05/15/23    Authorization Type [employer] - [id number] (PT/OT/Chiro combined)    PT Start Time 0848    PT Stop Time 0932    PT Time Calculation (min) 44 min    Activity Tolerance Patient tolerated treatment well    Behavior During Therapy WFL for tasks assessed/performed               Past Medical History:  Diagnosis Date   Arthritis    BPH (benign prostatic hyperplasia)    Colon polyp    ED (erectile dysfunction)    Elevated liver enzymes    Gallstones    Hepatitis    History of anemia    Hypercholesterolemia    Hypothyroidism    Mild tricuspid regurgitation    Mitral valve regurgitation    Thyroid disease    Vertigo    Past Surgical History:  Procedure Laterality Date   ANTERIOR CERVICAL DECOMP/DISCECTOMY FUSION N/A 07/23/2022   Procedure: Anterior Cervical Discectomy and Fusion Cervical Three-Four/Four-Five/Five-Six;  Surgeon: Bedelia Person, MD;  Location: [facility] OR;  Service: Neurosurgery;  Laterality: N/A;  3C   FLEXIBLE SIGMOIDOSCOPY     TOTAL HIP ARTHROPLASTY Left 01/06/2023   Procedure: LEFT TOTAL HIP ARTHROPLASTY ANTERIOR APPROACH;  Surgeon: Gean Birchwood, MD;  Location: [facility] ORS;  Service: Orthopedics;  Laterality: Left;   Patient Active Problem List   Diagnosis Date Noted   UTI (urinary tract infection) 01/24/2023   Sepsis 01/24/2023   Bacteremia 01/24/2023   AKI (acute kidney injury) 01/23/2023   Arthritis of left hip 01/01/2023   Paresthesia 08/21/2022   Stenosis of cervical spine with myelopathy 07/23/2022   Gait abnormality 06/18/2022   Cervical myelopathy 06/17/2022   Debility 06/06/2022   Neck pain 06/06/2022   OA (osteoarthritis) of hip 07/24/2021   Immunosuppressed status 09/05/2014   Hepatitis,  autoimmune 09/05/2014    PCP: Daisy Floro, MD   REFERRING PROVIDER: Gean Birchwood, MD   REFERRING DIAG: M25.559 (ICD-10-CM) - Hip pain   THERAPY DIAG:  Muscle weakness (generalized)  Other abnormalities of gait and mobility  Difficulty in walking, not elsewhere classified  Unsteadiness on feet  Pain in left hip  RATIONALE FOR EVALUATION AND TREATMENT: Rehabilitation  ONSET DATE: 01/06/23 - Anterior L total hip arthroplasty   NEXT MD VISIT: 4 months (saw MD 1 week prior to PT eval)   SUBJECTIVE:  SUBJECTIVE STATEMENT: No pain but stiffness today.  PAIN: Are you having pain? No  PERTINENT HISTORY:  L THA 01/06/23 2 L hip pain - severe OA; ACDF C3-4, C4-5, C5-6 on 07/23/22 2 cervical myelopathy; arthritis; BPH; anemia; thyroid disease - hypothyroidism; MVR & TVR; vertigo; autoimmune hepatitis; HLD  PRECAUTIONS: None  WEIGHT BEARING RESTRICTIONS: No  FALLS:  Has patient fallen in last 6 months? No  LIVING ENVIRONMENT: Lives with: lives with their spouse Lives in: House/apartment Stairs: Yes: Internal: 14 steps; on right going up and External: 3 steps; on left going up Has following equipment at home: Single point cane and Walker - 2 wheeled  OCCUPATION: Retired  PLOF: Independent and Leisure: play golf, gym 5x/wk    PATIENT GOALS: "strength & flexibility"    OBJECTIVE: (objective measures completed at initial evaluation unless otherwise dated)  DIAGNOSTIC FINDINGS:  01/23/23 - L hip x-ray:  FINDINGS: Status post left total hip arthroplasty. No periprosthetic lucency to suggest loosening. No acute fracture, dislocation or subluxation. Pelvic ring intact.   IMPRESSION: Unremarkable appearance status post left total hip arthroplasty.  01/23/23:  Cervical/Thoracic/Lumbar MRI: IMPRESSION: C3-6 ACDF with unchanged severe spinal canal stenosis at C3-4 and focal myelomalacia. Unchanged moderate bilateral C5-6 neural foraminal stenosis. Mild multilevel thoracic degenerative disc disease with mild spinal canal stenosis at T9-10. Unchanged mild spinal canal stenosis at L2-3, L3-4 and L4-5. Unchanged mild right L4-5 neural foraminal stenosis.   PATIENT SURVEYS:  LEFS 38 / 80 = 47.5 %  COGNITION: Overall cognitive status: Within functional limits for tasks assessed    SENSATION: WFL Numbness and tingling in B hands  MUSCLE LENGTH: Hamstrings: mod tight B ITB: mild tight B Piriformis: mod/severe tight B Hip flexors: mod tight B Quads: mod tight B Heelcord: NT  POSTURE:  rounded shoulders and forward head  LOWER EXTREMITY ROM: B hip ROM limited in all motions, knee and ankles WFL  LOWER EXTREMITY MMT:  MMT Right eval Left eval  Hip flexion 4+ 4  Hip extension 3- 2+  Hip abduction 4 2+  Hip adduction 4- 4  Hip internal rotation 4 4  Hip external rotation 4- 2+  Knee flexion 4+ 4  Knee extension 5 5  Ankle dorsiflexion 5 5  Ankle plantarflexion 4  13 SLS HR 4 14 SLS HR  Ankle inversion    Ankle eversion     (Blank rows = not tested)  FUNCTIONAL TESTS:  5 times sit to stand: 18.25 sec Timed up and go (TUG): 12.56 sec 10 meter walk test: 10.19 sec Functional gait assessment: 19/30 ; 19-24 = medium risk fall   GAIT: Distance walked: 80 ft Assistive device utilized: Single point cane and None Level of assistance: Modified independence Gait pattern: step through pattern and trendelenburg Comments: gait speed = 3.22 ft/sec  STAIRS: Level of Assistance: Modified independence Stair Negotiation Technique: Alternating Pattern  with Single Rail on Right Number of Stairs: 14  Height of Stairs: 7  Comments: Hip instability noted   TODAY'S TREATMENT:  04/15/23 THERAPEUTIC EXERCISE: to improve flexibility,  strength and mobility.  Verbal and tactile cues throughout for technique.  Elliptical x 6 min L1.0 Functional Squat at counter x 20 with 3 sec pause Single leg deadlift x 10 with 1 hand support  R/L SLS 2x for 10 sec holds - intermittent UE support- more support needed for L LE Standing heel raise with glute set x 15 R/L lateral eccentric lowering from 6" step with light tap to floor x 25  R/L forward eccentric lowering from 6" step to floor x 20 Knee flexion 20# x 20 BLE; R (15#)/L (10#) x 20  Knee extension 30# BLE x 20 ; 15# R/L x 10 Seated hamstring stretch x 1 min each  04/09/23 THERAPEUTIC EXERCISE: to improve flexibility, strength and mobility.  Verbal and tactile cues throughout for technique.  Rec Bike - L3 x 6 min Standing RTB 4-way SLR x10 bil - UE support on back of chair for balance, cues for upright posture avoiding accessory trunk motion Seated GTB L HS curl 2 x 10 Counter squat x 25 - cues to keep torso upright B heel raises + quad/glute/TrA isometric set x 25 B lateral step-up/down on 6" step x 20 R/L lateral eccentric lowering from 6" step with light tap to floor x 15 - cues to maintain level pelvis R/L SLS + 5-way tap to colored dots x 5 cycles on each leg    04/02/23 Therapeutic Exercise: to improve strength and mobility.  Demo, verbal and tactile cues throughout for technique.  Nustep L5x13min Single leg bridges B con with opp leg kick out x 10 bil S/L clams R/L x 10 bil Prone hip extension x 10 bil Seated hamstring stretch hip hinge x 30 sec bil Leg press 25# x 20 bil; 15# x 20 R/L Knee flexion 20# x 20 bil; 10# x 20 R/L Single leg deadlift x 10 with 1 hand support   03/27/23 Therapeutic Exercise: to improve strength and mobility.  Demo, verbal and tactile cues throughout for technique.  Nustep L3x105min Supine KTOS stretch x 30 sec bil Supine hamstring stretch x 30 sec bil Supine quad stretch x 30 sec bil Bridges with glute set x 10 Single leg bridge B con  with opp leg kick out x 10 S/L clams L side 2x10- cues to keep hips from rolling back Prone hip extension x 10 bil   PATIENT EDUCATION:  Education details: ongoing PT POC Person educated: Patient Education method: Explanation Education comprehension: verbalized understanding  HOME EXERCISE PROGRAM: Access Code: [email or web address] URL: [email or web address] Date: 04/02/2023 Prepared by: Verta Ellen  Exercises - Supine Quadriceps Stretch with Strap on Table  - 1 x daily - 7 x weekly - 3 sets - 30 sec hold - Supine Bridge with Knee Extension and Pelvic Floor Contraction  - 1 x daily - 7 x weekly - 3 sets - 10 reps - Clamshell  - 1 x daily - 7 x weekly - 3 sets - 10 reps - Prone Hip Extension  - 1 x daily - 7 x weekly - 3 sets - 10 reps - Seated Hamstring Stretch  - 1 x daily - 7 x weekly - 3 sets - 30 sec hold - Forward T with Counter Support  - 1 x daily - 7 x weekly - 3 sets - 10 reps   ASSESSMENT:  CLINICAL IMPRESSION: Pt showed good response to the progression of exercises. He is challenged with SLS and any exercise that requires support on solely on L leg. Min cues required throughout session.  Pt would continue to benefit from skilled therapy.   OBJECTIVE IMPAIRMENTS: Abnormal gait, decreased activity tolerance, decreased balance, decreased endurance, decreased knowledge of condition, decreased mobility, difficulty walking, decreased ROM, decreased strength, decreased safety awareness, hypomobility, increased fascial restrictions, impaired perceived functional ability, impaired flexibility, improper body mechanics, postural dysfunction, and pain.   ACTIVITY LIMITATIONS: carrying, lifting, bending, sitting, standing, squatting, sleeping, stairs, transfers, bed mobility, locomotion level, and caring for others  PARTICIPATION LIMITATIONS: meal prep, cleaning, laundry, shopping, community activity, and yard work  PERSONAL FACTORS: Fitness, Past/current experiences, Time  since onset of injury/illness/exacerbation, and 3+ comorbidities: ACDF C3-4, C4-5, C5-6 on 07/23/22 2 cervical myelopathy; arthritis; BPH; anemia; thyroid disease - hypothyroidism; MVR & TVR; vertigo; autoimmune hepatitis; HLD  are also affecting patient's functional outcome.   REHAB POTENTIAL: Good  CLINICAL DECISION MAKING: Evolving/moderate complexity  EVALUATION COMPLEXITY: Moderate   GOALS: Goals reviewed with patient? Yes  SHORT TERM GOALS: Target date: 04/17/2023   Patient will be independent with initial HEP. Baseline: Currently completing HH PT HEP Goal status: MET  04/09/23  2.  Patient will demonstrate improved B hip strength by grossly 1/2 MMT grade for improved ease of mobility and gait stability. Baseline: Refer to above MMT table Goal status: IN PROGRESS  LONG TERM GOALS: Target date: 05/15/2023   Patient will be independent with advanced/ongoing HEP to improve outcomes and carryover.  Baseline:  Goal status: IN PROGRESS  2.  Patient will demonstrate improved B LE strength to >/= 4 to 4+/5 for improved stability and ease of mobility. Baseline: Refer to above MMT table Goal status: IN PROGRESS  3.  Patient will be able to ambulate 600' with or w/o LRAD and normal gait pattern without evidence of Trendelenburg to safely access community.  Baseline: Trendelenburg gait with SPC Goal status: IN PROGRESS  4.  Patient will negotiate stairs reciprocally with normal step pattern w/o limitation due to L hip pain or instability due to weakness. Baseline:  Goal status: IN PROGRESS   5.  Patient will report </= 29/80 on LEFS to demonstrate improved functional ability. Baseline: 38 / 80 = 47.5 % Goal status: IN PROGRESS  6.  Patient will demonstrate at least 23/30 on FGA to decrease risk of falls. Baseline: 19/30 Goal status: IN PROGRESS   7.  Patient will improve 5x STS time to </= 13.6 seconds to demonstrate improved functional strength and transfer  efficiency. Baseline: 18.25 sec Goal status: IN PROGRESS   PLAN:  PT FREQUENCY: 2x/week  PT DURATION: 8 weeks  PLANNED INTERVENTIONS: Therapeutic exercises, Therapeutic activity, Neuromuscular re-education, Balance training, Gait training, Patient/Family education, Self Care, Joint mobilization, Stair training, DME instructions, Dry Needling, Electrical stimulation, Cryotherapy, Moist heat, scar mobilization, Taping, Ultrasound, Ionotophoresis 4mg /ml Dexamethasone, Manual therapy, and Re-evaluation  PLAN FOR NEXT SESSION: asses LE strength for STG#2; continue glute and proximal LE strengthening; balance training   Darleene Cleaver, PTA 04/15/2023, 10:01 AM

## 2023-04-17 ENCOUNTER — Encounter: Payer: 59 | Admitting: Physical Therapy

## 2023-04-17 DIAGNOSIS — R338 Other retention of urine: Secondary | ICD-10-CM | POA: Diagnosis not present

## 2023-04-22 ENCOUNTER — Ambulatory Visit: Payer: 59

## 2023-04-22 DIAGNOSIS — M25552 Pain in left hip: Secondary | ICD-10-CM

## 2023-04-22 DIAGNOSIS — R262 Difficulty in walking, not elsewhere classified: Secondary | ICD-10-CM

## 2023-04-22 DIAGNOSIS — M6281 Muscle weakness (generalized): Secondary | ICD-10-CM | POA: Diagnosis not present

## 2023-04-22 DIAGNOSIS — R2689 Other abnormalities of gait and mobility: Secondary | ICD-10-CM | POA: Diagnosis not present

## 2023-04-22 DIAGNOSIS — R2681 Unsteadiness on feet: Secondary | ICD-10-CM | POA: Diagnosis not present

## 2023-04-22 NOTE — Therapy (Signed)
OUTPATIENT PHYSICAL THERAPY TREATMENT  Patient Name: Nyxon Strupp MRN: [id number] DOB:[birth year], 67 y.o., male Today's Date: 04/22/2023   END OF SESSION:  PT End of Session - 04/22/23 0940     Visit Number 6    Date for PT Re-Evaluation 05/15/23    Authorization Type [employer] - VL:30 (PT/OT/Chiro combined)    PT Start Time 0848    PT Stop Time 0934    PT Time Calculation (min) 46 min    Activity Tolerance Patient tolerated treatment well    Behavior During Therapy WFL for tasks assessed/performed               Past Medical History:  Diagnosis Date   Arthritis    BPH (benign prostatic hyperplasia)    Colon polyp    ED (erectile dysfunction)    Elevated liver enzymes    Gallstones    Hepatitis    History of anemia    Hypercholesterolemia    Hypothyroidism    Mild tricuspid regurgitation    Mitral valve regurgitation    Thyroid disease    Vertigo    Past Surgical History:  Procedure Laterality Date   ANTERIOR CERVICAL DECOMP/DISCECTOMY FUSION N/A 07/23/2022   Procedure: Anterior Cervical Discectomy and Fusion Cervical Three-Four/Four-Five/Five-Six;  Surgeon: Bedelia Person, MD;  Location: [facility] OR;  Service: Neurosurgery;  Laterality: N/A;  3C   FLEXIBLE SIGMOIDOSCOPY     TOTAL HIP ARTHROPLASTY Left 01/06/2023   Procedure: LEFT TOTAL HIP ARTHROPLASTY ANTERIOR APPROACH;  Surgeon: Gean Birchwood, MD;  Location: [facility] ORS;  Service: Orthopedics;  Laterality: Left;   Patient Active Problem List   Diagnosis Date Noted   UTI (urinary tract infection) 01/24/2023   Sepsis (HCC) 01/24/2023   Bacteremia 01/24/2023   AKI (acute kidney injury) (HCC) 01/23/2023   Arthritis of left hip 01/01/2023   Paresthesia 08/21/2022   Stenosis of cervical spine with myelopathy (HCC) 07/23/2022   Gait abnormality 06/18/2022   Cervical myelopathy (HCC) 06/17/2022   Debility 06/06/2022   Neck pain 06/06/2022   OA (osteoarthritis) of hip 07/24/2021   Immunosuppressed status (HCC)  09/05/2014   Hepatitis, autoimmune (HCC) 09/05/2014    PCP: Daisy Floro, MD   REFERRING PROVIDER: Gean Birchwood, MD   REFERRING DIAG: M25.559 (ICD-10-CM) - Hip pain   THERAPY DIAG:  Muscle weakness (generalized)  Other abnormalities of gait and mobility  Difficulty in walking, not elsewhere classified  Unsteadiness on feet  Pain in left hip  RATIONALE FOR EVALUATION AND TREATMENT: Rehabilitation  ONSET DATE: 01/06/23 - Anterior L total hip arthroplasty   NEXT MD VISIT: 4 months (saw MD 1 week prior to PT eval)   SUBJECTIVE:  SUBJECTIVE STATEMENT: Pretty tight in the L hip today.  PAIN: Are you having pain? No  PERTINENT HISTORY:  L THA 01/06/23 2 L hip pain - severe OA; ACDF C3-4, C4-5, C5-6 on 07/23/22 2 cervical myelopathy; arthritis; BPH; anemia; thyroid disease - hypothyroidism; MVR & TVR; vertigo; autoimmune hepatitis; HLD  PRECAUTIONS: None  WEIGHT BEARING RESTRICTIONS: No  FALLS:  Has patient fallen in last 6 months? No  LIVING ENVIRONMENT: Lives with: lives with their spouse Lives in: House/apartment Stairs: Yes: Internal: 14 steps; on right going up and External: 3 steps; on left going up Has following equipment at home: Single point cane and Walker - 2 wheeled  OCCUPATION: Retired  PLOF: Independent and Leisure: play golf, gym 5x/wk    PATIENT GOALS: "strength & flexibility"    OBJECTIVE: (objective measures completed at initial evaluation unless otherwise dated)  DIAGNOSTIC FINDINGS:  01/23/23 - L hip x-ray:  FINDINGS: Status post left total hip arthroplasty. No periprosthetic lucency to suggest loosening. No acute fracture, dislocation or subluxation. Pelvic ring intact.   IMPRESSION: Unremarkable appearance status post left total hip  arthroplasty.  01/23/23: Cervical/Thoracic/Lumbar MRI: IMPRESSION: C3-6 ACDF with unchanged severe spinal canal stenosis at C3-4 and focal myelomalacia. Unchanged moderate bilateral C5-6 neural foraminal stenosis. Mild multilevel thoracic degenerative disc disease with mild spinal canal stenosis at T9-10. Unchanged mild spinal canal stenosis at L2-3, L3-4 and L4-5. Unchanged mild right L4-5 neural foraminal stenosis.   PATIENT SURVEYS:  LEFS 38 / 80 = 47.5 %  COGNITION: Overall cognitive status: Within functional limits for tasks assessed    SENSATION: WFL Numbness and tingling in B hands  MUSCLE LENGTH: Hamstrings: mod tight B ITB: mild tight B Piriformis: mod/severe tight B Hip flexors: mod tight B Quads: mod tight B Heelcord: NT  POSTURE:  rounded shoulders and forward head  LOWER EXTREMITY ROM: B hip ROM limited in all motions, knee and ankles WFL  LOWER EXTREMITY MMT:  MMT Right eval Left eval Right Left  Hip flexion 4+ 4 5 4+  Hip extension 3- 2+ 3+ 3+  Hip abduction 4 2+ 4 3+  Hip adduction 4- 4    Hip internal rotation 4 4    Hip external rotation 4- 2+ 4+ 4-  Knee flexion 4+ 4 4+ 4+  Knee extension 5 5    Ankle dorsiflexion 5 5    Ankle plantarflexion 4  13 SLS HR 4 14 SLS HR    Ankle inversion      Ankle eversion       (Blank rows = not tested)  FUNCTIONAL TESTS:  5 times sit to stand: 18.25 sec Timed up and go (TUG): 12.56 sec 10 meter walk test: 10.19 sec Functional gait assessment: 19/30 ; 19-24 = medium risk fall   GAIT: Distance walked: 80 ft Assistive device utilized: Single point cane and None Level of assistance: Modified independence Gait pattern: step through pattern and trendelenburg Comments: gait speed = 3.22 ft/sec  STAIRS: Level of Assistance: Modified independence Stair Negotiation Technique: Alternating Pattern  with Single Rail on Right Number of Stairs: 14  Height of Stairs: 7  Comments: Hip instability  noted   TODAY'S TREATMENT:   04/22/23 THERAPEUTIC EXERCISE: to improve flexibility, strength and mobility.  Verbal and tactile cues throughout for technique.  Retro step x 20 with head truns Standing hip flexor stretch with trunk ext 5x15 second hold MMT Fwd step and reach x10 Lateral step and reach x10 Retro walking 4x79ft Side steps 4x25 ft  04/15/23 THERAPEUTIC EXERCISE: to improve flexibility, strength and mobility.  Verbal and tactile cues throughout for technique.  Elliptical x 6 min L1.0 Functional Squat at counter x 20 with 3 sec pause Single leg deadlift x 10 with 1 hand support  R/L SLS 2x for 10 sec holds - intermittent UE support- more support needed for L LE Standing heel raise with glute set x 15 R/L lateral eccentric lowering from 6" step with light tap to floor x 25  R/L forward eccentric lowering from 6" step to floor x 20 Knee flexion 20# x 20 BLE; R (15#)/L (10#) x 20  Knee extension 30# BLE x 20 ; 15# R/L x 10 Seated hamstring stretch x 1 min each  04/09/23 THERAPEUTIC EXERCISE: to improve flexibility, strength and mobility.  Verbal and tactile cues throughout for technique.  Rec Bike - L3 x 6 min Standing RTB 4-way SLR x10 bil - UE support on back of chair for balance, cues for upright posture avoiding accessory trunk motion Seated GTB L HS curl 2 x 10 Counter squat x 25 - cues to keep torso upright B heel raises + quad/glute/TrA isometric set x 25 B lateral step-up/down on 6" step x 20 R/L lateral eccentric lowering from 6" step with light tap to floor x 15 - cues to maintain level pelvis R/L SLS + 5-way tap to colored dots x 5 cycles on each leg    04/02/23 Therapeutic Exercise: to improve strength and mobility.  Demo, verbal and tactile cues throughout for technique.  Nustep L5x25min Single leg bridges B con with opp leg kick out x 10 bil S/L clams R/L x 10 bil Prone hip extension x 10 bil Seated hamstring stretch hip hinge x 30 sec bil Leg press 25#  x 20 bil; 15# x 20 R/L Knee flexion 20# x 20 bil; 10# x 20 R/L Single leg deadlift x 10 with 1 hand support   03/27/23 Therapeutic Exercise: to improve strength and mobility.  Demo, verbal and tactile cues throughout for technique.  Nustep L3x55min Supine KTOS stretch x 30 sec bil Supine hamstring stretch x 30 sec bil Supine quad stretch x 30 sec bil Bridges with glute set x 10 Single leg bridge B con with opp leg kick out x 10 S/L clams L side 2x10- cues to keep hips from rolling back Prone hip extension x 10 bil   PATIENT EDUCATION:  Education details: ongoing PT POC Person educated: Patient Education method: Explanation Education comprehension: verbalized understanding  HOME EXERCISE PROGRAM: Access Code: [email or web address] URL: [email or web address] Date: 04/02/2023 Prepared by: Verta Ellen  Exercises - Supine Quadriceps Stretch with Strap on Table  - 1 x daily - 7 x weekly - 3 sets - 30 sec hold - Supine Bridge with Knee Extension and Pelvic Floor Contraction  - 1 x daily - 7 x weekly - 3 sets - 10 reps - Clamshell  - 1 x daily - 7 x weekly - 3 sets - 10 reps - Prone Hip Extension  - 1 x daily - 7 x weekly - 3 sets - 10 reps - Seated Hamstring Stretch  - 1 x daily - 7 x weekly - 3 sets - 30 sec hold - Forward T with Counter Support  - 1 x daily - 7 x weekly - 3 sets - 10 reps   ASSESSMENT:  CLINICAL IMPRESSION: Pt shows improved LE strength based on MMT scores. Most of his weakness is in the gluteal muscles. Worked on strengthening and focused  more on proprioceptive exercises to improve balance and muscle retraining. Pt would continue to benefit from skilled therapy to address deficits.   OBJECTIVE IMPAIRMENTS: Abnormal gait, decreased activity tolerance, decreased balance, decreased endurance, decreased knowledge of condition, decreased mobility, difficulty walking, decreased ROM, decreased strength, decreased safety awareness, hypomobility, increased fascial  restrictions, impaired perceived functional ability, impaired flexibility, improper body mechanics, postural dysfunction, and pain.   ACTIVITY LIMITATIONS: carrying, lifting, bending, sitting, standing, squatting, sleeping, stairs, transfers, bed mobility, locomotion level, and caring for others  PARTICIPATION LIMITATIONS: meal prep, cleaning, laundry, shopping, community activity, and yard work  PERSONAL FACTORS: Fitness, Past/current experiences, Time since onset of injury/illness/exacerbation, and 3+ comorbidities: ACDF C3-4, C4-5, C5-6 on 07/23/22 2 cervical myelopathy; arthritis; BPH; anemia; thyroid disease - hypothyroidism; MVR & TVR; vertigo; autoimmune hepatitis; HLD  are also affecting patient's functional outcome.   REHAB POTENTIAL: Good  CLINICAL DECISION MAKING: Evolving/moderate complexity  EVALUATION COMPLEXITY: Moderate   GOALS: Goals reviewed with patient? Yes  SHORT TERM GOALS: Target date: 04/17/2023   Patient will be independent with initial HEP. Baseline: Currently completing HH PT HEP Goal status: MET  04/09/23  2.  Patient will demonstrate improved B hip strength by grossly 1/2 MMT grade for improved ease of mobility and gait stability. Baseline: Refer to above MMT table Goal status: IN PROGRESS 04/22/23  LONG TERM GOALS: Target date: 05/15/2023   Patient will be independent with advanced/ongoing HEP to improve outcomes and carryover.  Baseline:  Goal status: IN PROGRESS  2.  Patient will demonstrate improved B LE strength to >/= 4 to 4+/5 for improved stability and ease of mobility. Baseline: Refer to above MMT table Goal status: IN PROGRESS  3.  Patient will be able to ambulate 600' with or w/o LRAD and normal gait pattern without evidence of Trendelenburg to safely access community.  Baseline: Trendelenburg gait with SPC Goal status: IN PROGRESS  4.  Patient will negotiate stairs reciprocally with normal step pattern w/o limitation due to L hip pain or  instability due to weakness. Baseline:  Goal status: IN PROGRESS   5.  Patient will report </= 29/80 on LEFS to demonstrate improved functional ability. Baseline: 38 / 80 = 47.5 % Goal status: IN PROGRESS  6.  Patient will demonstrate at least 23/30 on FGA to decrease risk of falls. Baseline: 19/30 Goal status: IN PROGRESS   7.  Patient will improve 5x STS time to </= 13.6 seconds to demonstrate improved functional strength and transfer efficiency. Baseline: 18.25 sec Goal status: IN PROGRESS   PLAN:  PT FREQUENCY: 2x/week  PT DURATION: 8 weeks  PLANNED INTERVENTIONS: Therapeutic exercises, Therapeutic activity, Neuromuscular re-education, Balance training, Gait training, Patient/Family education, Self Care, Joint mobilization, Stair training, DME instructions, Dry Needling, Electrical stimulation, Cryotherapy, Moist heat, scar mobilization, Taping, Ultrasound, Ionotophoresis 4mg /ml Dexamethasone, Manual therapy, and Re-evaluation  PLAN FOR NEXT SESSION: asses LE strength for STG#2; continue glute and proximal LE strengthening; balance training   Darleene Cleaver, PTA 04/22/2023, 9:41 AM

## 2023-04-25 ENCOUNTER — Ambulatory Visit: Payer: 59

## 2023-04-25 DIAGNOSIS — R338 Other retention of urine: Secondary | ICD-10-CM | POA: Diagnosis not present

## 2023-04-28 IMAGING — CT CT CARDIAC CORONARY ARTERY CALCIUM SCORE
3 series · 14 of 20 positions shown, 15 images · non-contrast
Comparison: None.
COMPARISON: None.

Addendum:
EXAM:
OVER-READ INTERPRETATION  CT CHEST

The following report is an over-read performed by radiologist Dr.
Berendt Cveso [REDACTED] on 06/11/2021. This
over-read does not include interpretation of cardiac or coronary
anatomy or pathology. The coronary calcium score interpretation by
the cardiologist is attached.
CLINICAL DATA: Cardiovascular Disease Risk stratification
Coronary Calcium Score
TECHNIQUE: A gated, non-contrast computed tomography scan of the heart was
performed using 3mm slice thickness. Axial images were analyzed on a
dedicated workstation. Calcium scoring of the coronary arteries was
performed using the Agatston method.

[Series 2: casc 3.0 bv41 2 bestdiast 73 % · axial · 0.46mm/px · z∈[-236,-160]mm · 4 of 43 slices shown, 5 images]
[im 9/43  vessel]
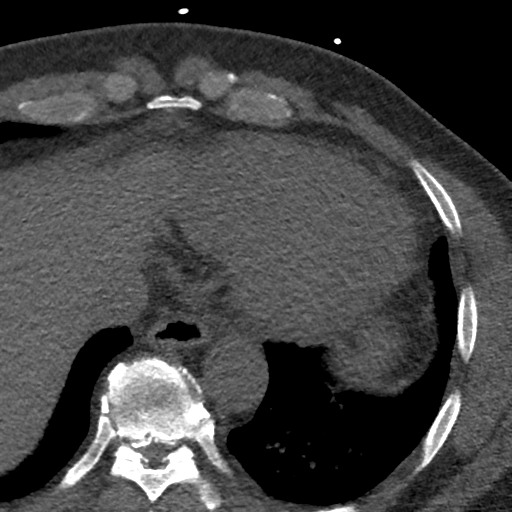
[im 9/43  lung]
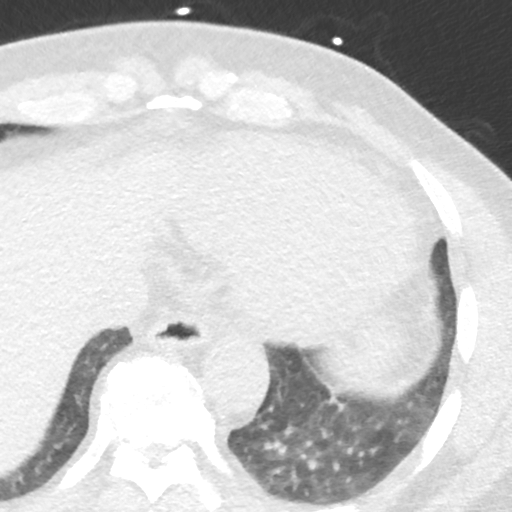
[im 17/43  vessel]
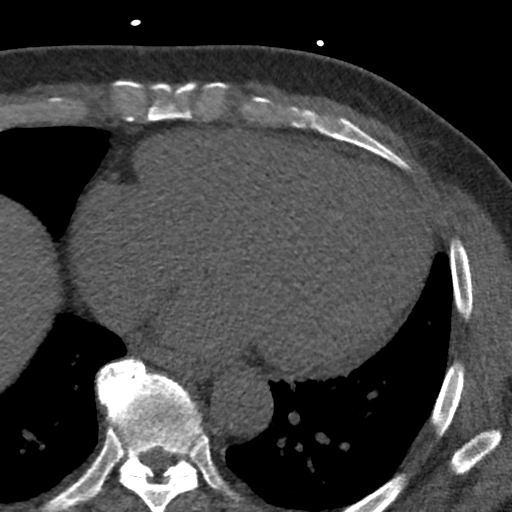
[im 26/43  vessel]
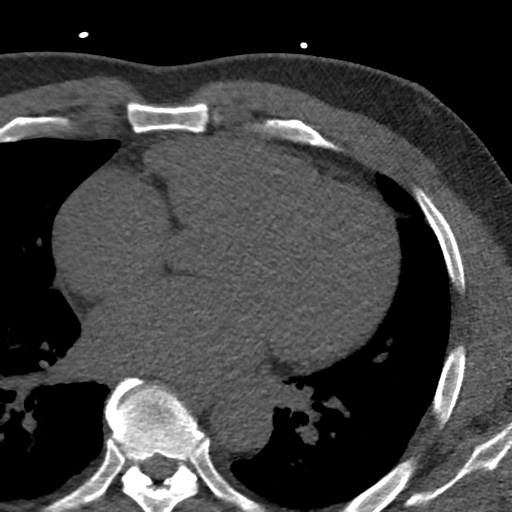
[im 34/43  vessel]
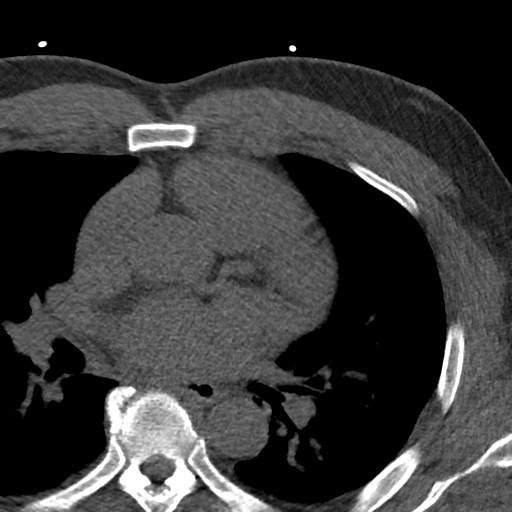

[Series 3: lung 73 % · axial · 0.71mm/px · z∈[-238,-154]mm · 5 of 43 slices shown]
[im 8/43  lung]
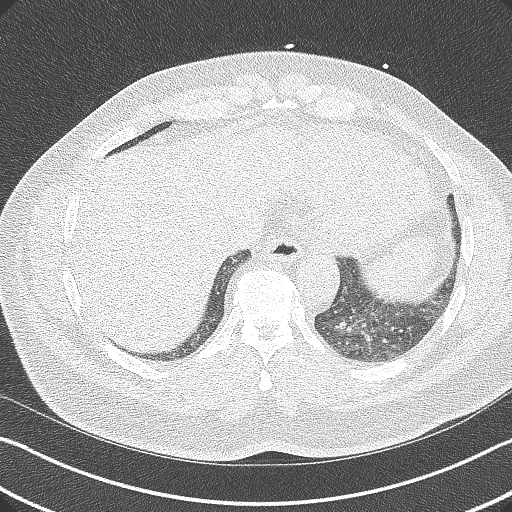
[im 15/43  lung]
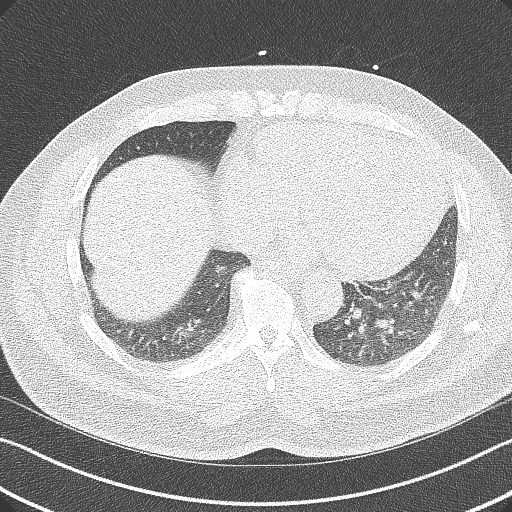
[im 22/43  lung]
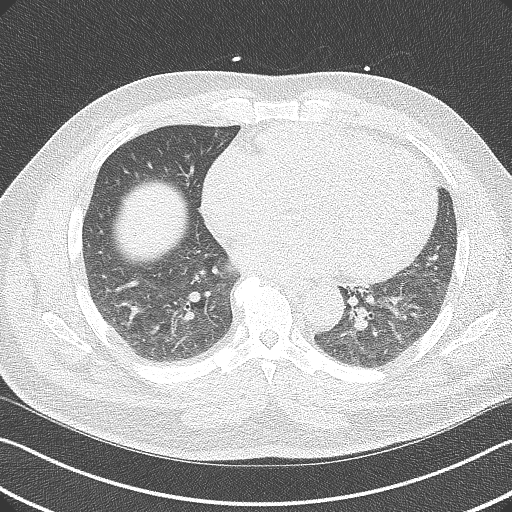
[im 29/43  lung]
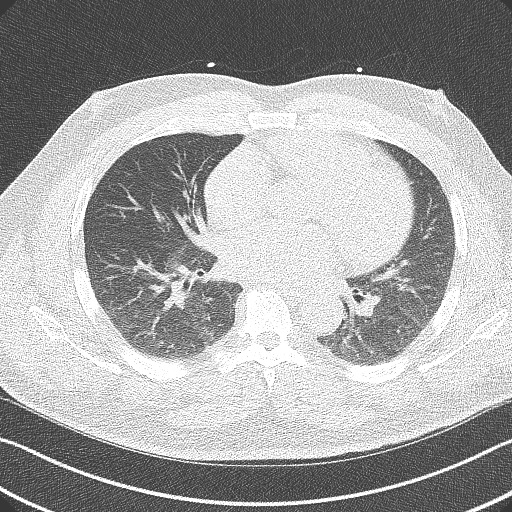
[im 36/43  lung]
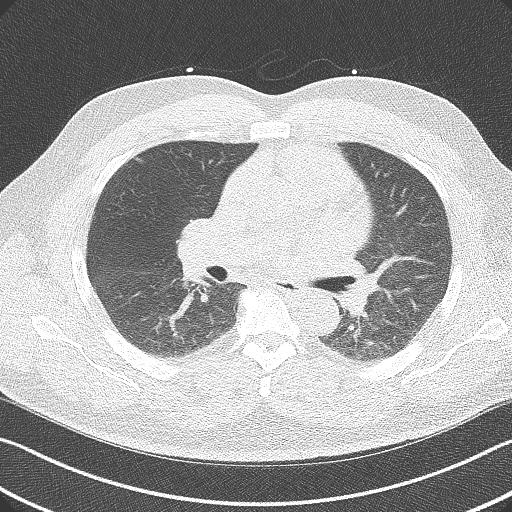

[Series 4: lung st 73 % · axial · 0.71mm/px · z∈[-238,-154]mm · 5 of 43 slices shown]
[im 8/43  lung]
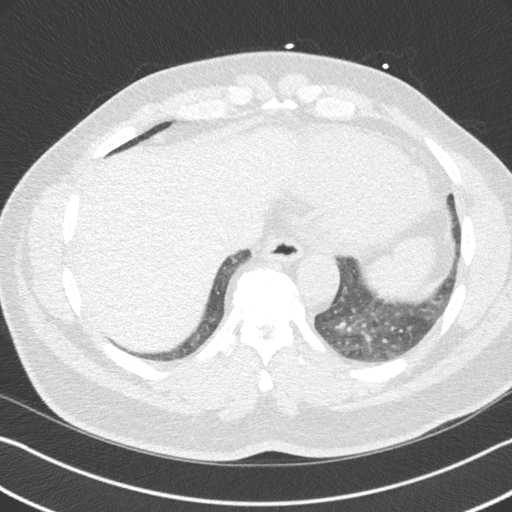
[im 15/43  lung]
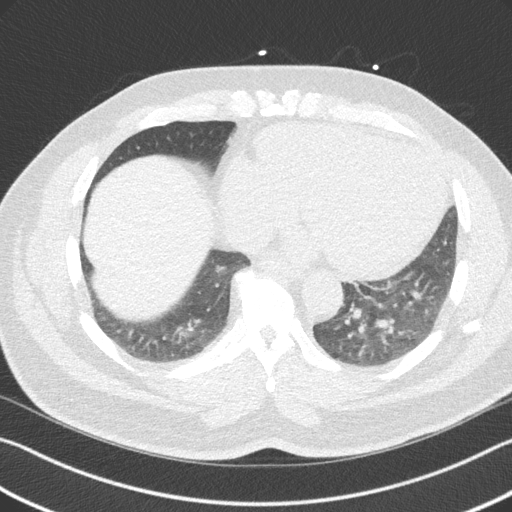
[im 22/43  lung]
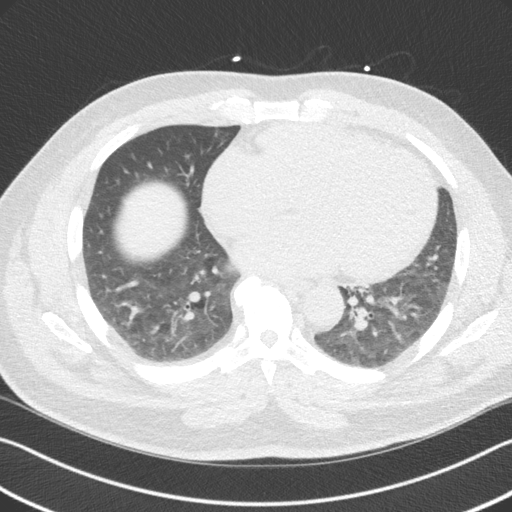
[im 29/43  lung]
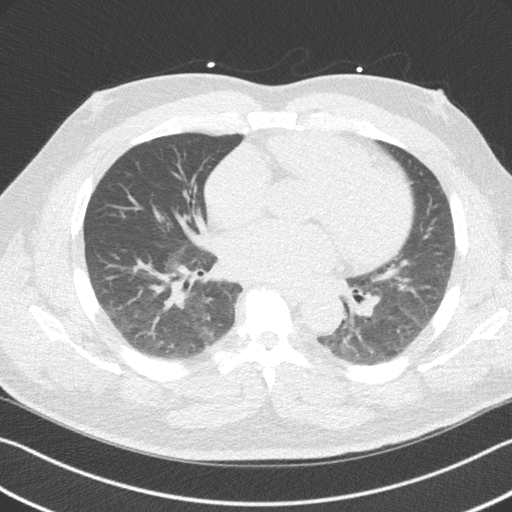
[im 36/43  lung]
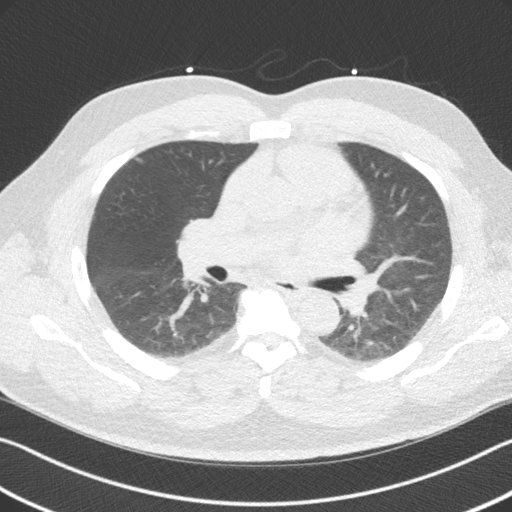

[14 of 20 positions shown; findings below may reference images not displayed]

FINDINGS: A few scattered small pulmonary nodules are noted in the lungs,
largest of which is in the periphery of the right upper lobe
anteriorly (axial image 7 of series 3) measuring 6 x 4 mm (mean
diameter of 5 mm). Within the visualized portions of the thorax
there are no suspicious appearing pulmonary nodules or masses, there
is no acute consolidative airspace disease, no pleural effusions, no
pneumothorax and no lymphadenopathy. Visualized portions of the
upper abdomen are unremarkable. There are no aggressive appearing
lytic or blastic lesions noted in the visualized portions of the
skeleton.
IMPRESSION: 1. Multiple small pulmonary nodules measuring 5 mm or less in size,
nonspecific, but statistically likely benign. No follow-up needed if
patient is low-risk (and has no known or suspected primary
neoplasm). Non-contrast chest CT can be considered in 12 months if
patient is high-risk. This recommendation follows the consensus
statement: Guidelines for Management of Incidental Pulmonary Nodules
Detected on CT Images: From the [HOSPITAL] 4052; Radiology
FINDINGS: Coronary Calcium Score:

Left main: 0

Left anterior descending artery: 0

Left circumflex artery: 0

Right coronary artery: 0

Total: 0

Percentile: 0

Pericardium: Normal.

Ascending Aorta: Normal caliber.

Non-cardiac: See separate report from [REDACTED].
IMPRESSION: Coronary calcium score of 0. This was 0 percentile for age-, race-,
and sex-matched controls.



If CAC=0, it is reasonable to withhold statin therapy and reassess
in 5 to 10 years, as long as higher risk conditions are absent
(diabetes mellitus, family history of premature CHD in first degree
relatives (males <55 years; females <65 years), cigarette smoking,
or LDL >=190 mg/dL).

If CAC is 1 to 99, it is reasonable to initiate statin therapy for
patients >=55 years of age.

If CAC is >=100 or >=75th percentile, it is reasonable to initiate
statin therapy at any age.

Cardiology referral should be considered for patients with CAC
scores >=400 or >=75th percentile.

*6712 AHA/ACC/AACVPR/AAPA/ABC/LIUDMILA/ROLENCHA/SQUAD/Nya/JIM/JANI/NURU DEEN
Guideline on the Management of Blood Cholesterol: A Report of the
American College of Cardiology/American Heart Association Task Force
on Clinical Practice Guidelines. J Am Coll Cardiol.
5134;73(24):3651-3047.

*** End of Addendum ***
EXAM:
OVER-READ INTERPRETATION  CT CHEST

The following report is an over-read performed by radiologist Dr.
Berendt Cveso [REDACTED] on 06/11/2021. This
over-read does not include interpretation of cardiac or coronary
anatomy or pathology. The coronary calcium score interpretation by
the cardiologist is attached.
FINDINGS: A few scattered small pulmonary nodules are noted in the lungs,
largest of which is in the periphery of the right upper lobe
anteriorly (axial image 7 of series 3) measuring 6 x 4 mm (mean
diameter of 5 mm). Within the visualized portions of the thorax
there are no suspicious appearing pulmonary nodules or masses, there
is no acute consolidative airspace disease, no pleural effusions, no
pneumothorax and no lymphadenopathy. Visualized portions of the
upper abdomen are unremarkable. There are no aggressive appearing
lytic or blastic lesions noted in the visualized portions of the
skeleton.
IMPRESSION: 1. Multiple small pulmonary nodules measuring 5 mm or less in size,
nonspecific, but statistically likely benign. No follow-up needed if
patient is low-risk (and has no known or suspected primary
neoplasm). Non-contrast chest CT can be considered in 12 months if
patient is high-risk. This recommendation follows the consensus
statement: Guidelines for Management of Incidental Pulmonary Nodules
Detected on CT Images: From the [HOSPITAL] 4052; Radiology

## 2023-04-28 NOTE — Therapy (Signed)
OUTPATIENT PHYSICAL THERAPY TREATMENT  Patient Name: Alex West MRN: [id number] DOB:21-Nov-1956, 67 y.o., male Today's Date: 04/29/2023   END OF SESSION:  PT End of Session - 04/29/23 0851     Visit Number 7    Date for PT Re-Evaluation 05/15/23    Authorization Type [employer] - VL:30 (PT/OT/Chiro combined)    PT Start Time [phone]    PT Stop Time 0930    PT Time Calculation (min) 39 min    Activity Tolerance Patient tolerated treatment well    Behavior During Therapy WFL for tasks assessed/performed               Past Medical History:  Diagnosis Date   Arthritis    BPH (benign prostatic hyperplasia)    Colon polyp    ED (erectile dysfunction)    Elevated liver enzymes    Gallstones    Hepatitis    History of anemia    Hypercholesterolemia    Hypothyroidism    Mild tricuspid regurgitation    Mitral valve regurgitation    Thyroid disease    Vertigo    Past Surgical History:  Procedure Laterality Date   ANTERIOR CERVICAL DECOMP/DISCECTOMY FUSION N/A 07/23/2022   Procedure: Anterior Cervical Discectomy and Fusion Cervical Three-Four/Four-Five/Five-Six;  Surgeon: Bedelia Person, MD;  Location: [facility] OR;  Service: Neurosurgery;  Laterality: N/A;  3C   FLEXIBLE SIGMOIDOSCOPY     TOTAL HIP ARTHROPLASTY Left 01/06/2023   Procedure: LEFT TOTAL HIP ARTHROPLASTY ANTERIOR APPROACH;  Surgeon: Gean Birchwood, MD;  Location: [facility] ORS;  Service: Orthopedics;  Laterality: Left;   Patient Active Problem List   Diagnosis Date Noted   UTI (urinary tract infection) 01/24/2023   Sepsis (HCC) 01/24/2023   Bacteremia 01/24/2023   AKI (acute kidney injury) (HCC) 01/23/2023   Arthritis of left hip 01/01/2023   Paresthesia 08/21/2022   Stenosis of cervical spine with myelopathy (HCC) 07/23/2022   Gait abnormality 06/18/2022   Cervical myelopathy (HCC) 06/17/2022   Debility 06/06/2022   Neck pain 06/06/2022   OA (osteoarthritis) of hip 07/24/2021   Immunosuppressed status (HCC)  09/05/2014   Hepatitis, autoimmune (HCC) 09/05/2014    PCP: Daisy Floro, MD   REFERRING PROVIDER: Gean Birchwood, MD   REFERRING DIAG: M25.559 (ICD-10-CM) - Hip pain   THERAPY DIAG:  Muscle weakness (generalized)  Other abnormalities of gait and mobility  Difficulty in walking, not elsewhere classified  Unsteadiness on feet  Pain in left hip  RATIONALE FOR EVALUATION AND TREATMENT: Rehabilitation  ONSET DATE: 01/06/23 - Anterior L total hip arthroplasty   NEXT MD VISIT: 4 months (saw MD 1 week prior to PT eval)   SUBJECTIVE:  SUBJECTIVE STATEMENT: I don't know if I slept on it wrong, but I'm having a little pain and stiffness today.  PAIN: Are you having pain? Yes: NPRS scale: 5/10 Pain location: L hip Pain description: stiffness Aggravating factors: unknown Relieving factors: unknown  PERTINENT HISTORY:  L THA 01/06/23 2 L hip pain - severe OA; ACDF C3-4, C4-5, C5-6 on 07/23/22 2 cervical myelopathy; arthritis; BPH; anemia; thyroid disease - hypothyroidism; MVR & TVR; vertigo; autoimmune hepatitis; HLD  PRECAUTIONS: None  WEIGHT BEARING RESTRICTIONS: No  FALLS:  Has patient fallen in last 6 months? No  LIVING ENVIRONMENT: Lives with: lives with their spouse Lives in: House/apartment Stairs: Yes: Internal: 14 steps; on right going up and External: 3 steps; on left going up Has following equipment at home: Single point cane and Walker - 2 wheeled  OCCUPATION: Retired  PLOF: Independent and Leisure: play golf, gym 5x/wk    PATIENT GOALS: "strength & flexibility"    OBJECTIVE: (objective measures completed at initial evaluation unless otherwise dated)  DIAGNOSTIC FINDINGS:  01/23/23 - L hip x-ray:  FINDINGS: Status post left total hip arthroplasty. No  periprosthetic lucency to suggest loosening. No acute fracture, dislocation or subluxation. Pelvic ring intact.   IMPRESSION: Unremarkable appearance status post left total hip arthroplasty.  01/23/23: Cervical/Thoracic/Lumbar MRI: IMPRESSION: C3-6 ACDF with unchanged severe spinal canal stenosis at C3-4 and focal myelomalacia. Unchanged moderate bilateral C5-6 neural foraminal stenosis. Mild multilevel thoracic degenerative disc disease with mild spinal canal stenosis at T9-10. Unchanged mild spinal canal stenosis at L2-3, L3-4 and L4-5. Unchanged mild right L4-5 neural foraminal stenosis.   PATIENT SURVEYS:  LEFS 38 / 80 = 47.5 %  COGNITION: Overall cognitive status: Within functional limits for tasks assessed    SENSATION: WFL Numbness and tingling in B hands  MUSCLE LENGTH: Hamstrings: mod tight B ITB: mild tight B Piriformis: mod/severe tight B Hip flexors: mod tight B Quads: mod tight B Heelcord: NT  POSTURE:  rounded shoulders and forward head  LOWER EXTREMITY ROM: B hip ROM limited in all motions, knee and ankles WFL  LOWER EXTREMITY MMT:  MMT Right eval Left eval Right 04/22/23 Left 04/22/23  Hip flexion 4+ 4 5 4+  Hip extension 3- 2+ 3+ 3+  Hip abduction 4 2+ 4 3+  Hip adduction 4- 4    Hip internal rotation 4 4    Hip external rotation 4- 2+ 4+ 4-  Knee flexion 4+ 4 4+ 4+  Knee extension 5 5    Ankle dorsiflexion 5 5    Ankle plantarflexion 4  13 SLS HR 4 14 SLS HR    Ankle inversion      Ankle eversion       (Blank rows = not tested)  FUNCTIONAL TESTS:  5 times sit to stand: 18.25 sec Timed up and go (TUG): 12.56 sec 10 meter walk test: 10.19 sec Functional gait assessment: 19/30 ; 19-24 = medium risk fall   GAIT: Distance walked: 80 ft Assistive device utilized: Single point cane and None Level of assistance: Modified independence Gait pattern: step through pattern and trendelenburg Comments: gait speed = 3.22 ft/sec  STAIRS: Level  of Assistance: Modified independence Stair Negotiation Technique: Alternating Pattern  with Single Rail on Right Number of Stairs: 14  Height of Stairs: 7  Comments: Hip instability noted   TODAY'S TREATMENT:   04/29/23 THERAPEUTIC EXERCISE: to improve flexibility, strength and mobility.  Verbal and tactile cues throughout for technique.  Nustep L5 x 6 min Standing  hip ABD and ext x 10 R, did some on left, but pt reporting lightheadedness  so moved to S/L S/L hip ABD 3x10 S/L Clam 3x10 Prone hip ext 3 x 10 B Functional Squat to blue boster 2 x 10 Standing lunge at counter x 10 ea  04/22/23 THERAPEUTIC EXERCISE: to improve flexibility, strength and mobility.  Verbal and tactile cues throughout for technique.  Retro step x 20 with head truns Standing hip flexor stretch with trunk ext 5x15 second hold MMT Fwd step and reach x10 Lateral step and reach x10 Retro walking 4x88ft Side steps 4x25 ft  04/15/23 THERAPEUTIC EXERCISE: to improve flexibility, strength and mobility.  Verbal and tactile cues throughout for technique.  Elliptical x 6 min L1.0 Functional Squat at counter x 20 with 3 sec pause Single leg deadlift x 10 with 1 hand support  R/L SLS 2x for 10 sec holds - intermittent UE support- more support needed for L LE Standing heel raise with glute set x 15 R/L lateral eccentric lowering from 6" step with light tap to floor x 25  R/L forward eccentric lowering from 6" step to floor x 20 Knee flexion 20# x 20 BLE; R (15#)/L (10#) x 20  Knee extension 30# BLE x 20 ; 15# R/L x 10 Seated hamstring stretch x 1 min each   PATIENT EDUCATION:  Education details: HEP update Person educated: Patient Education method: Explanation, Demonstration, and Handouts Education comprehension: verbalized understanding and returned demonstration  HOME EXERCISE PROGRAM: Access Code: [email or web address] URL: [email or web address] Date: 04/29/2023 Prepared by: Raynelle Fanning  Exercises - Supine  Quadriceps Stretch with Strap on Table  - 1 x daily - 7 x weekly - 3 sets - 30 sec hold - Supine Bridge with Knee Extension and Pelvic Floor Contraction  - 1 x daily - 7 x weekly - 3 sets - 10 reps - Clamshell  - 1 x daily - 7 x weekly - 3 sets - 10 reps - Prone Hip Extension  - 1 x daily - 7 x weekly - 3 sets - 10 reps - Seated Hamstring Stretch  - 1 x daily - 7 x weekly - 3 sets - 30 sec hold - Forward T with Counter Support  - 1 x daily - 7 x weekly - 3 sets - 10 reps - Sidestepping  - 1 x daily - 7 x weekly - 3 sets - 10 reps - Retro Step  - 1 x daily - 7 x weekly - 3 sets - 10 reps - Gastroc Stretch on Wall  - 1 x daily - 7 x weekly - 3 sets - 30 sec hold - Sidelying Hip Abduction  - 1 x daily - 3-4 x weekly - 3 sets - 10 reps   ASSESSMENT:  CLINICAL IMPRESSION: [name] reports some increased pain and stiffness today after working out for 1.5 hours yesterday and/or sleeping on hip wrong. He tolerated TE well despite this. He reports he has had low BP in the morning since his surgery which improves as the day progresses (he monitors at home). He experienced one incidence of lightheadedness with initial standing TE today. PT advised him to phone MD regarding this rather than wait until his physical at the end of the month. S/L hip ABD and clams are challenging. Worked on lunges at [employer] as patient would like to be able to get up from the floor. [name] continues to demonstrate potential for improvement and would benefit from continued skilled therapy to address impairments.  OBJECTIVE IMPAIRMENTS: Abnormal gait, decreased activity tolerance, decreased balance, decreased endurance, decreased knowledge of condition, decreased mobility, difficulty walking, decreased ROM, decreased strength, decreased safety awareness, hypomobility, increased fascial restrictions, impaired perceived functional ability, impaired flexibility, improper body mechanics, postural dysfunction, and pain.   ACTIVITY  LIMITATIONS: carrying, lifting, bending, sitting, standing, squatting, sleeping, stairs, transfers, bed mobility, locomotion level, and caring for others  PARTICIPATION LIMITATIONS: meal prep, cleaning, laundry, shopping, community activity, and yard work  PERSONAL FACTORS: Fitness, Past/current experiences, Time since onset of injury/illness/exacerbation, and 3+ comorbidities: ACDF C3-4, C4-5, C5-6 on 07/23/22 2 cervical myelopathy; arthritis; BPH; anemia; thyroid disease - hypothyroidism; MVR & TVR; vertigo; autoimmune hepatitis; HLD  are also affecting patient's functional outcome.   REHAB POTENTIAL: Good  CLINICAL DECISION MAKING: Evolving/moderate complexity  EVALUATION COMPLEXITY: Moderate   GOALS: Goals reviewed with patient? Yes  SHORT TERM GOALS: Target date: 04/17/2023   Patient will be independent with initial HEP. Baseline: Currently completing HH PT HEP Goal status: MET  04/09/23  2.  Patient will demonstrate improved B hip strength by grossly 1/2 MMT grade for improved ease of mobility and gait stability. Baseline: Refer to above MMT table Goal status: IN PROGRESS 04/22/23  LONG TERM GOALS: Target date: 05/15/2023   Patient will be independent with advanced/ongoing HEP to improve outcomes and carryover.  Baseline:  Goal status: IN PROGRESS  2.  Patient will demonstrate improved B LE strength to >/= 4 to 4+/5 for improved stability and ease of mobility. Baseline: Refer to above MMT table Goal status: IN PROGRESS  3.  Patient will be able to ambulate 600' with or w/o LRAD and normal gait pattern without evidence of Trendelenburg to safely access community.  Baseline: Trendelenburg gait with SPC Goal status: IN PROGRESS  4.  Patient will negotiate stairs reciprocally with normal step pattern w/o limitation due to L hip pain or instability due to weakness. Baseline:  Goal status: IN PROGRESS   5.  Patient will report </= 29/80 on LEFS to demonstrate improved  functional ability. Baseline: 38 / 80 = 47.5 % Goal status: IN PROGRESS  6.  Patient will demonstrate at least 23/30 on FGA to decrease risk of falls. Baseline: 19/30 Goal status: IN PROGRESS   7.  Patient will improve 5x STS time to </= 13.6 seconds to demonstrate improved functional strength and transfer efficiency. Baseline: 18.25 sec Goal status: IN PROGRESS   PLAN:  PT FREQUENCY: 2x/week  PT DURATION: 8 weeks  PLANNED INTERVENTIONS: Therapeutic exercises, Therapeutic activity, Neuromuscular re-education, Balance training, Gait training, Patient/Family education, Self Care, Joint mobilization, Stair training, DME instructions, Dry Needling, Electrical stimulation, Cryotherapy, Moist heat, scar mobilization, Taping, Ultrasound, Ionotophoresis 4mg /ml Dexamethasone, Manual therapy, and Re-evaluation  PLAN FOR NEXT SESSION: asses LE strength for STG#2; continue glute and proximal LE strengthening; balance training   Manar Smalling, PT 04/29/2023, 12:02 PM

## 2023-04-29 ENCOUNTER — Encounter: Payer: Self-pay | Admitting: Physical Therapy

## 2023-04-29 ENCOUNTER — Ambulatory Visit: Payer: 59 | Attending: Orthopedic Surgery | Admitting: Physical Therapy

## 2023-04-29 DIAGNOSIS — R2681 Unsteadiness on feet: Secondary | ICD-10-CM | POA: Diagnosis not present

## 2023-04-29 DIAGNOSIS — R262 Difficulty in walking, not elsewhere classified: Secondary | ICD-10-CM | POA: Diagnosis not present

## 2023-04-29 DIAGNOSIS — M6281 Muscle weakness (generalized): Secondary | ICD-10-CM | POA: Diagnosis not present

## 2023-04-29 DIAGNOSIS — R2689 Other abnormalities of gait and mobility: Secondary | ICD-10-CM | POA: Diagnosis not present

## 2023-04-29 DIAGNOSIS — M25552 Pain in left hip: Secondary | ICD-10-CM | POA: Diagnosis not present

## 2023-05-06 ENCOUNTER — Encounter: Payer: Self-pay | Admitting: Physical Therapy

## 2023-05-06 ENCOUNTER — Ambulatory Visit: Payer: Medicare Other | Admitting: Physical Therapy

## 2023-05-06 DIAGNOSIS — R262 Difficulty in walking, not elsewhere classified: Secondary | ICD-10-CM | POA: Diagnosis not present

## 2023-05-06 DIAGNOSIS — R2681 Unsteadiness on feet: Secondary | ICD-10-CM

## 2023-05-06 DIAGNOSIS — M25552 Pain in left hip: Secondary | ICD-10-CM | POA: Diagnosis not present

## 2023-05-06 DIAGNOSIS — M6281 Muscle weakness (generalized): Secondary | ICD-10-CM | POA: Diagnosis not present

## 2023-05-06 DIAGNOSIS — R2689 Other abnormalities of gait and mobility: Secondary | ICD-10-CM | POA: Diagnosis not present

## 2023-05-06 NOTE — Therapy (Signed)
OUTPATIENT PHYSICAL THERAPY TREATMENT  Patient Name: Alex West MRN: [id number] DOB:July 03, 1956, 67 y.o., male Today's Date: 05/06/2023   END OF SESSION:  PT End of Session - 05/06/23 0933     Visit Number 8    Date for PT Re-Evaluation 05/15/23    Authorization Type [employer] - [id number] (PT/OT/Chiro combined)    PT Start Time 0933    PT Stop Time 1016    PT Time Calculation (min) 43 min    Activity Tolerance Patient tolerated treatment well    Behavior During Therapy WFL for tasks assessed/performed               Past Medical History:  Diagnosis Date   Arthritis    BPH (benign prostatic hyperplasia)    Colon polyp    ED (erectile dysfunction)    Elevated liver enzymes    Gallstones    Hepatitis    History of anemia    Hypercholesterolemia    Hypothyroidism    Mild tricuspid regurgitation    Mitral valve regurgitation    Thyroid disease    Vertigo    Past Surgical History:  Procedure Laterality Date   ANTERIOR CERVICAL DECOMP/DISCECTOMY FUSION N/A 07/23/2022   Procedure: Anterior Cervical Discectomy and Fusion Cervical Three-Four/Four-Five/Five-Six;  Surgeon: Bedelia Person, MD;  Location: [facility] OR;  Service: Neurosurgery;  Laterality: N/A;  3C   FLEXIBLE SIGMOIDOSCOPY     TOTAL HIP ARTHROPLASTY Left 01/06/2023   Procedure: LEFT TOTAL HIP ARTHROPLASTY ANTERIOR APPROACH;  Surgeon: Gean Birchwood, MD;  Location: [facility] ORS;  Service: Orthopedics;  Laterality: Left;   Patient Active Problem List   Diagnosis Date Noted   UTI (urinary tract infection) 01/24/2023   Sepsis (HCC) 01/24/2023   Bacteremia 01/24/2023   AKI (acute kidney injury) (HCC) 01/23/2023   Arthritis of left hip 01/01/2023   Paresthesia 08/21/2022   Stenosis of cervical spine with myelopathy (HCC) 07/23/2022   Gait abnormality 06/18/2022   Cervical myelopathy (HCC) 06/17/2022   Debility 06/06/2022   Neck pain 06/06/2022   OA (osteoarthritis) of hip 07/24/2021   Immunosuppressed status (HCC)  09/05/2014   Hepatitis, autoimmune (HCC) 09/05/2014    PCP: Daisy Floro, MD   REFERRING PROVIDER: Gean Birchwood, MD   REFERRING DIAG: M25.559 (ICD-10-CM) - Hip pain   THERAPY DIAG:  Muscle weakness (generalized)  Other abnormalities of gait and mobility  Difficulty in walking, not elsewhere classified  Unsteadiness on feet  Pain in left hip  RATIONALE FOR EVALUATION AND TREATMENT: Rehabilitation  ONSET DATE: 01/06/23 - Anterior L total hip arthroplasty   NEXT MD VISIT: 4 months (saw MD 1 week prior to PT eval)   SUBJECTIVE:  SUBJECTIVE STATEMENT: Little sore and achey today.  He fell this weekend.  He went to [facility] over the weekend, went on a tour, and his L leg just gave out, fell straight back and hit head.  So just wants a gentle workoiut today.  He up and down stairs at home yesterday 10x (180 steps) and did home workout at gym.   PAIN: Are you having pain? Yes: NPRS scale: 5-6/10 Pain location: L hip Pain description: stiffness Aggravating factors: unknown Relieving factors: unknown  PERTINENT HISTORY:  L THA 01/06/23 2 L hip pain - severe OA; ACDF C3-4, C4-5, C5-6 on 07/23/22 2 cervical myelopathy; arthritis; BPH; anemia; thyroid disease - hypothyroidism; MVR & TVR; vertigo; autoimmune hepatitis; HLD  PRECAUTIONS: None  WEIGHT BEARING RESTRICTIONS: No  FALLS:  Has patient fallen in last 6 months? No  LIVING ENVIRONMENT: Lives with: lives with their spouse Lives in: House/apartment Stairs: Yes: Internal: 14 steps; on right going up and External: 3 steps; on left going up Has following equipment at home: Single point cane and Walker - 2 wheeled  OCCUPATION: Retired  PLOF: Independent and Leisure: play golf, gym 5x/wk    PATIENT GOALS: "strength &  flexibility"    OBJECTIVE: (objective measures completed at initial evaluation unless otherwise dated)  DIAGNOSTIC FINDINGS:  01/23/23 - L hip x-ray:  FINDINGS: Status post left total hip arthroplasty. No periprosthetic lucency to suggest loosening. No acute fracture, dislocation or subluxation. Pelvic ring intact.   IMPRESSION: Unremarkable appearance status post left total hip arthroplasty.  01/23/23: Cervical/Thoracic/Lumbar MRI: IMPRESSION: C3-6 ACDF with unchanged severe spinal canal stenosis at C3-4 and focal myelomalacia. Unchanged moderate bilateral C5-6 neural foraminal stenosis. Mild multilevel thoracic degenerative disc disease with mild spinal canal stenosis at T9-10. Unchanged mild spinal canal stenosis at L2-3, L3-4 and L4-5. Unchanged mild right L4-5 neural foraminal stenosis.   PATIENT SURVEYS:  LEFS 38 / 80 = 47.5 %  COGNITION: Overall cognitive status: Within functional limits for tasks assessed    SENSATION: WFL Numbness and tingling in B hands  MUSCLE LENGTH: Hamstrings: mod tight B ITB: mild tight B Piriformis: mod/severe tight B Hip flexors: mod tight B Quads: mod tight B Heelcord: NT  POSTURE:  rounded shoulders and forward head  LOWER EXTREMITY ROM: B hip ROM limited in all motions, knee and ankles WFL  LOWER EXTREMITY MMT:  MMT Right eval Left eval Right 04/22/23 Left 04/22/23  Hip flexion 4+ 4 5 4+  Hip extension 3- 2+ 3+ 3+  Hip abduction 4 2+ 4 3+  Hip adduction 4- 4    Hip internal rotation 4 4    Hip external rotation 4- 2+ 4+ 4-  Knee flexion 4+ 4 4+ 4+  Knee extension 5 5    Ankle dorsiflexion 5 5    Ankle plantarflexion 4  13 SLS HR 4 14 SLS HR    Ankle inversion      Ankle eversion       (Blank rows = not tested)  FUNCTIONAL TESTS:  5 times sit to stand: 18.25 sec Timed up and go (TUG): 12.56 sec 10 meter walk test: 10.19 sec Functional gait assessment: 19/30 ; 19-24 = medium risk fall   GAIT: Distance walked: 80  ft Assistive device utilized: Single point cane and None Level of assistance: Modified independence Gait pattern: step through pattern and trendelenburg Comments: gait speed = 3.22 ft/sec  STAIRS: Level of Assistance: Modified independence Stair Negotiation Technique: Alternating Pattern  with Single Rail on Right  Number of Stairs: 14  Height of Stairs: 7  Comments: Hip instability noted   TODAY'S TREATMENT:   05/06/23 Therapeutic Exercise: to improve strength and mobility.  Demo, verbal and tactile cues throughout for technique. Bike L1 x 6 min Single leg RDLS x 10 bil with 1 UE support on counter Review HEP - continue working on squats and leg extension for glut strengthening Neuromuscular Reeducation: to improve balance and stability. SBA for safety throughout.  In corner on airex: Eyes open feet apart x 30 sec  Eyes open feet apart with head nods x 10 Eyes open feet apart with head turns x 10 Eyes closed feet apart x 30 sec Eyes closed feet apart with head nods x 10 Eyes closed feet apart with head turns x 10  In corner on Level surface: ankle sways x 20 Wall bumps 2 x 10 progressively increasing distance from wall.  Manual Therapy: to decrease muscle spasm and pain and improve mobility IASTM with foam roller to bil glutes,hamstrings and calves    04/29/23 THERAPEUTIC EXERCISE: to improve flexibility, strength and mobility.  Verbal and tactile cues throughout for technique.  Nustep L5 x 6 min Standing hip ABD and ext x 10 R, did some on left, but pt reporting lightheadedness  so moved to S/L S/L hip ABD 3x10 S/L Clam 3x10 Prone hip ext 3 x 10 B Functional Squat to blue boster 2 x 10 Standing lunge at counter x 10 ea  04/22/23 THERAPEUTIC EXERCISE: to improve flexibility, strength and mobility.  Verbal and tactile cues throughout for technique.  Retro step x 20 with head truns Standing hip flexor stretch with trunk ext 5x15 second hold MMT Fwd step and reach  x10 Lateral step and reach x10 Retro walking 4x60ft Side steps 4x25 ft  04/15/23 THERAPEUTIC EXERCISE: to improve flexibility, strength and mobility.  Verbal and tactile cues throughout for technique.  Elliptical x 6 min L1.0 Functional Squat at counter x 20 with 3 sec pause Single leg deadlift x 10 with 1 hand support  R/L SLS 2x for 10 sec holds - intermittent UE support- more support needed for L LE Standing heel raise with glute set x 15 R/L lateral eccentric lowering from 6" step with light tap to floor x 25  R/L forward eccentric lowering from 6" step to floor x 20 Knee flexion 20# x 20 BLE; R (15#)/L (10#) x 20  Knee extension 30# BLE x 20 ; 15# R/L x 10 Seated hamstring stretch x 1 min each   PATIENT EDUCATION:  Education details: HEP update Person educated: Patient Education method: Explanation, Demonstration, and Handouts Education comprehension: verbalized understanding and returned demonstration  HOME EXERCISE PROGRAM: Access Code: [email or web address] URL: [email or web address] Date: 04/29/2023 Prepared by: Raynelle Fanning  Exercises - Supine Quadriceps Stretch with Strap on Table  - 1 x daily - 7 x weekly - 3 sets - 30 sec hold - Supine Bridge with Knee Extension and Pelvic Floor Contraction  - 1 x daily - 7 x weekly - 3 sets - 10 reps - Clamshell  - 1 x daily - 7 x weekly - 3 sets - 10 reps - Prone Hip Extension  - 1 x daily - 7 x weekly - 3 sets - 10 reps - Seated Hamstring Stretch  - 1 x daily - 7 x weekly - 3 sets - 30 sec hold - Forward T with Counter Support  - 1 x daily - 7 x weekly - 3 sets - 10 reps - Sidestepping  -  1 x daily - 7 x weekly - 3 sets - 10 reps - Retro Step  - 1 x daily - 7 x weekly - 3 sets - 10 reps - Gastroc Stretch on Wall  - 1 x daily - 7 x weekly - 3 sets - 30 sec hold - Sidelying Hip Abduction  - 1 x daily - 3-4 x weekly - 3 sets - 10 reps   ASSESSMENT:  CLINICAL IMPRESSION: [name] reported increased soreness following fall this  weekend.  He does plan on following up with MD as did hit his head and has history of ACDF and cervical myelopathy, but reports mostly soreness in his hip, from description may have overdone walking and over-fatigued muscles.  Today focused on balance, overall did well with exercises on airex, noted HS weakness on R with single leg RDLs and increased hypertonicity in R HS.  Reported decreased soreness following manual therapy.  Lauralyn Primes continues to demonstrate potential for improvement and would benefit from continued skilled therapy to address impairments.    OBJECTIVE IMPAIRMENTS: Abnormal gait, decreased activity tolerance, decreased balance, decreased endurance, decreased knowledge of condition, decreased mobility, difficulty walking, decreased ROM, decreased strength, decreased safety awareness, hypomobility, increased fascial restrictions, impaired perceived functional ability, impaired flexibility, improper body mechanics, postural dysfunction, and pain.   ACTIVITY LIMITATIONS: carrying, lifting, bending, sitting, standing, squatting, sleeping, stairs, transfers, bed mobility, locomotion level, and caring for others  PARTICIPATION LIMITATIONS: meal prep, cleaning, laundry, shopping, community activity, and yard work  PERSONAL FACTORS: Fitness, Past/current experiences, Time since onset of injury/illness/exacerbation, and 3+ comorbidities: ACDF C3-4, C4-5, C5-6 on 07/23/22 2 cervical myelopathy; arthritis; BPH; anemia; thyroid disease - hypothyroidism; MVR & TVR; vertigo; autoimmune hepatitis; HLD  are also affecting patient's functional outcome.   REHAB POTENTIAL: Good  CLINICAL DECISION MAKING: Evolving/moderate complexity  EVALUATION COMPLEXITY: Moderate   GOALS: Goals reviewed with patient? Yes  SHORT TERM GOALS: Target date: 04/17/2023   Patient will be independent with initial HEP. Baseline: Currently completing HH PT HEP Goal status: MET  04/09/23  2.  Patient will  demonstrate improved B hip strength by grossly 1/2 MMT grade for improved ease of mobility and gait stability. Baseline: Refer to above MMT table Goal status: IN PROGRESS 04/22/23  LONG TERM GOALS: Target date: 05/15/2023   Patient will be independent with advanced/ongoing HEP to improve outcomes and carryover.  Baseline:  Goal status: IN PROGRESS  2.  Patient will demonstrate improved B LE strength to >/= 4 to 4+/5 for improved stability and ease of mobility. Baseline: Refer to above MMT table Goal status: IN PROGRESS  3.  Patient will be able to ambulate 600' with or w/o LRAD and normal gait pattern without evidence of Trendelenburg to safely access community.  Baseline: Trendelenburg gait with SPC Goal status: IN PROGRESS  4.  Patient will negotiate stairs reciprocally with normal step pattern w/o limitation due to L hip pain or instability due to weakness. Baseline:  Goal status: IN PROGRESS   5.  Patient will report </= 29/80 on LEFS to demonstrate improved functional ability. Baseline: 38 / 80 = 47.5 % Goal status: IN PROGRESS  6.  Patient will demonstrate at least 23/30 on FGA to decrease risk of falls. Baseline: 19/30 Goal status: IN PROGRESS   7.  Patient will improve 5x STS time to </= 13.6 seconds to demonstrate improved functional strength and transfer efficiency. Baseline: 18.25 sec Goal status: IN PROGRESS   PLAN:  PT FREQUENCY: 2x/week  PT  DURATION: 8 weeks  PLANNED INTERVENTIONS: Therapeutic exercises, Therapeutic activity, Neuromuscular re-education, Balance training, Gait training, Patient/Family education, Self Care, Joint mobilization, Stair training, DME instructions, Dry Needling, Electrical stimulation, Cryotherapy, Moist heat, scar mobilization, Taping, Ultrasound, Ionotophoresis 4mg /ml Dexamethasone, Manual therapy, and Re-evaluation  PLAN FOR NEXT SESSION: asses LE strength for STG#2; continue glute and proximal LE strengthening; balance  training   Jena Gauss, PT, DPT  05/06/2023, 12:18 PM

## 2023-05-08 ENCOUNTER — Ambulatory Visit: Payer: Medicare Other

## 2023-05-13 ENCOUNTER — Encounter: Payer: Self-pay | Admitting: Physical Therapy

## 2023-05-13 ENCOUNTER — Ambulatory Visit: Payer: 59 | Admitting: Physical Therapy

## 2023-05-13 DIAGNOSIS — N401 Enlarged prostate with lower urinary tract symptoms: Secondary | ICD-10-CM | POA: Diagnosis not present

## 2023-05-13 DIAGNOSIS — M25552 Pain in left hip: Secondary | ICD-10-CM

## 2023-05-13 DIAGNOSIS — M6281 Muscle weakness (generalized): Secondary | ICD-10-CM | POA: Diagnosis not present

## 2023-05-13 DIAGNOSIS — R350 Frequency of micturition: Secondary | ICD-10-CM | POA: Diagnosis not present

## 2023-05-13 DIAGNOSIS — R2681 Unsteadiness on feet: Secondary | ICD-10-CM

## 2023-05-13 DIAGNOSIS — R262 Difficulty in walking, not elsewhere classified: Secondary | ICD-10-CM

## 2023-05-13 DIAGNOSIS — R2689 Other abnormalities of gait and mobility: Secondary | ICD-10-CM

## 2023-05-13 DIAGNOSIS — R338 Other retention of urine: Secondary | ICD-10-CM | POA: Diagnosis not present

## 2023-05-13 DIAGNOSIS — R351 Nocturia: Secondary | ICD-10-CM | POA: Diagnosis not present

## 2023-05-13 NOTE — Therapy (Signed)
OUTPATIENT PHYSICAL THERAPY TREATMENT  Patient Name: Alex West MRN: [id number] DOB:[birth year], 67 y.o., male Today's Date: 05/13/2023   END OF SESSION:  PT End of Session - 05/13/23 0849     Visit Number 9    Date for PT Re-Evaluation 05/15/23    Authorization Type [employer] - [id number] (PT/OT/Chiro combined)    PT Start Time 0849    PT Stop Time 0937    PT Time Calculation (min) 48 min    Activity Tolerance Patient tolerated treatment well    Behavior During Therapy [facility] for tasks assessed/performed               Past Medical History:  Diagnosis Date   Arthritis    BPH (benign prostatic hyperplasia)    Colon polyp    ED (erectile dysfunction)    Elevated liver enzymes    Gallstones    Hepatitis    History of anemia    Hypercholesterolemia    Hypothyroidism    Mild tricuspid regurgitation    Mitral valve regurgitation    Thyroid disease    Vertigo    Past Surgical History:  Procedure Laterality Date   ANTERIOR CERVICAL DECOMP/DISCECTOMY FUSION N/A 07/23/2022   Procedure: Anterior Cervical Discectomy and Fusion Cervical Three-Four/Four-Five/Five-Six;  Surgeon: Bedelia Person, MD;  Location: [facility] OR;  Service: Neurosurgery;  Laterality: N/A;  3C   FLEXIBLE SIGMOIDOSCOPY     TOTAL HIP ARTHROPLASTY Left 01/06/2023   Procedure: LEFT TOTAL HIP ARTHROPLASTY ANTERIOR APPROACH;  Surgeon: Gean Birchwood, MD;  Location: [facility] ORS;  Service: Orthopedics;  Laterality: Left;   Patient Active Problem List   Diagnosis Date Noted   UTI (urinary tract infection) 01/24/2023   Sepsis (HCC) 01/24/2023   Bacteremia 01/24/2023   AKI (acute kidney injury) (HCC) 01/23/2023   Arthritis of left hip 01/01/2023   Paresthesia 08/21/2022   Stenosis of cervical spine with myelopathy (HCC) 07/23/2022   Gait abnormality 06/18/2022   Cervical myelopathy (HCC) 06/17/2022   Debility 06/06/2022   Neck pain 06/06/2022   OA (osteoarthritis) of hip 07/24/2021   Immunosuppressed status (HCC)  09/05/2014   Hepatitis, autoimmune (HCC) 09/05/2014    PCP: Daisy Floro, MD   REFERRING PROVIDER: Gean Birchwood, MD   REFERRING DIAG: M25.559 (ICD-10-CM) - Hip pain   THERAPY DIAG:  Muscle weakness (generalized)  Other abnormalities of gait and mobility  Difficulty in walking, not elsewhere classified  Unsteadiness on feet  Pain in left hip  RATIONALE FOR EVALUATION AND TREATMENT: Rehabilitation  ONSET DATE: 01/06/23 - Anterior L total hip arthroplasty   NEXT MD VISIT: 4 months (saw MD 1 week prior to PT eval)   SUBJECTIVE:  SUBJECTIVE STATEMENT: Pt reports a first time ever fall just over a week ago due to his L leg "giving way", now exacerbated by a hard step off a curb landing on the L leg, "jamming the hip up".   PAIN: Are you having pain? Yes: NPRS scale: 3-4/10 Pain location: L hip Pain description: soreness and stiffness Aggravating factors: unknown Relieving factors: unknown  PERTINENT HISTORY:  L THA 01/06/23 2 L hip pain - severe OA; ACDF C3-4, C4-5, C5-6 on 07/23/22 2 cervical myelopathy; arthritis; BPH; anemia; thyroid disease - hypothyroidism; MVR & TVR; vertigo; autoimmune hepatitis; HLD  PRECAUTIONS: None  WEIGHT BEARING RESTRICTIONS: No  FALLS:  Has patient fallen in last 6 months? No  LIVING ENVIRONMENT: Lives with: lives with their spouse Lives in: House/apartment Stairs: Yes: Internal: 14 steps; on right going up and External: 3 steps; on left going up Has following equipment at home: Single point cane and Walker - 2 wheeled  OCCUPATION: Retired  PLOF: Independent and Leisure: play golf, gym 5x/wk    PATIENT GOALS: "strength & flexibility"    OBJECTIVE: (objective measures completed at initial evaluation unless otherwise  dated)  DIAGNOSTIC FINDINGS:  01/23/23 - L hip x-ray:  FINDINGS: Status post left total hip arthroplasty. No periprosthetic lucency to suggest loosening. No acute fracture, dislocation or subluxation. Pelvic ring intact.   IMPRESSION: Unremarkable appearance status post left total hip arthroplasty.  01/23/23: Cervical/Thoracic/Lumbar MRI: IMPRESSION: C3-6 ACDF with unchanged severe spinal canal stenosis at C3-4 and focal myelomalacia. Unchanged moderate bilateral C5-6 neural foraminal stenosis. Mild multilevel thoracic degenerative disc disease with mild spinal canal stenosis at T9-10. Unchanged mild spinal canal stenosis at L2-3, L3-4 and L4-5. Unchanged mild right L4-5 neural foraminal stenosis.   PATIENT SURVEYS:  LEFS 38 / 80 = 47.5 %  COGNITION: Overall cognitive status: Within functional limits for tasks assessed    SENSATION: WFL Numbness and tingling in B hands  MUSCLE LENGTH: Hamstrings: mod tight B ITB: mild tight B Piriformis: mod/severe tight B Hip flexors: mod tight B Quads: mod tight B Heelcord: NT  POSTURE:  rounded shoulders and forward head  LOWER EXTREMITY ROM: B hip ROM limited in all motions, knee and ankles [facility]  LOWER EXTREMITY MMT:  MMT Right eval Left eval Right 04/22/23 Left 04/22/23 Right 05/13/23 Left 05/13/23  Hip flexion 4+ 4 5 4+ 4+ 4+  Hip extension 3- 2+ 3+ 3+ 4 3+  Hip abduction 4 2+ 4 3+ 4 3-  Hip adduction 4- 4   4+ 4  Hip internal rotation 4 4   4+ 4+  Hip external rotation 4- 2+ 4+ 4- 4 3+  Knee flexion 4+ 4 4+ 4+ 4+ 4+  Knee extension 5 5   5 5   Ankle dorsiflexion 5 5   5 5   Ankle plantarflexion 4  13 SLS HR 4 14 SLS HR      Ankle inversion        Ankle eversion         (Blank rows = not tested)  FUNCTIONAL TESTS:  5 times sit to stand: 18.25 sec Timed up and go (TUG): 12.56 sec 10 meter walk test: 10.19 sec Functional gait assessment: 19/30 ; 19-24 = medium risk fall   GAIT: Distance walked: 80 ft Assistive device  utilized: Single point cane and None Level of assistance: Modified independence Gait pattern: step through pattern and trendelenburg Comments: gait speed = 3.22 ft/sec  STAIRS: Level of Assistance: Modified independence Stair Negotiation Technique: Alternating Pattern  with Single Rail on Right Number of Stairs: 14  Height of Stairs: 7  Comments: Hip instability noted   TODAY'S TREATMENT:    05/13/23 THERAPEUTIC EXERCISE: to improve flexibility, strength and mobility.  Verbal and tactile cues throughout for technique.  UBE L3 x 6 min Hooklying L/R SKTC stretch 2 x 30" Hooklying L/R KTOS piriformis stretch 2 x 30" Hooklying L/R figure-4 piriformis stretch 2 x 30"  THERAPEUTIC ACTIVITIES: LE MMT LE flexibility assessment  MANUAL THERAPY: To promote normalized muscle tension, improved flexibility, and reduced pain.  STM/DTM to L lateral quads and R distal HS   05/06/23  Therapeutic Exercise: to improve strength and mobility.  Demo, verbal and tactile cues throughout for technique. Bike L1 x 6 min Single leg RDLS x 10 bil with 1 UE support on counter Review HEP - continue working on squats and leg extension for glut strengthening Neuromuscular Reeducation: to improve balance and stability. SBA for safety throughout.  In corner on airex: Eyes open feet apart x 30 sec  Eyes open feet apart with head nods x 10 Eyes open feet apart with head turns x 10 Eyes closed feet apart x 30 sec Eyes closed feet apart with head nods x 10 Eyes closed feet apart with head turns x 10  In corner on Level surface: ankle sways x 20 Wall bumps 2 x 10 progressively increasing distance from wall.  Manual Therapy: to decrease muscle spasm and pain and improve mobility IASTM with foam roller to bil glutes,hamstrings and calves    04/29/23 THERAPEUTIC EXERCISE: to improve flexibility, strength and mobility.  Verbal and tactile cues throughout for technique.  Nustep L5 x 6 min Standing hip ABD and ext  x 10 R, did some on left, but pt reporting lightheadedness  so moved to S/L S/L hip ABD 3x10 S/L Clam 3x10 Prone hip ext 3 x 10 B Functional Squat to blue boster 2 x 10 Standing lunge at counter x 10 ea   PATIENT EDUCATION:  Education details: HEP update Person educated: Patient Education method: Explanation, Demonstration, and Handouts Education comprehension: verbalized understanding and returned demonstration  HOME EXERCISE PROGRAM: Access Code: [email or web address] URL: [email or web address] Date: 05/13/2023 Prepared by: Glenetta Hew  Exercises - Supine Quadriceps Stretch with Strap on Table  - 1 x daily - 7 x weekly - 3 sets - 30 sec hold - Supine Bridge with Knee Extension and Pelvic Floor Contraction  - 1 x daily - 7 x weekly - 3 sets - 10 reps - Clamshell  - 1 x daily - 7 x weekly - 3 sets - 10 reps - Prone Hip Extension  - 1 x daily - 7 x weekly - 3 sets - 10 reps - Seated Hamstring Stretch  - 1 x daily - 7 x weekly - 3 sets - 30 sec hold - Forward T with Counter Support  - 1 x daily - 7 x weekly - 3 sets - 10 reps - Sidestepping  - 1 x daily - 7 x weekly - 3 sets - 10 reps - Retro Step  - 1 x daily - 7 x weekly - 3 sets - 10 reps - Gastroc Stretch on Wall  - 1 x daily - 7 x weekly - 3 sets - 30 sec hold - Sidelying Hip Abduction  - 1 x daily - 3-4 x weekly - 3 sets - 10 reps - Standing Partial Lunge  - 1 x daily - 7 x weekly - 3 sets - 10 reps -  Supine Piriformis Stretch with Foot on Ground  - 2 x daily - 7 x weekly - 3 reps - 30 sec hold - Supine Figure 4 Piriformis Stretch  - 2 x daily - 7 x weekly - 3 reps - 30 sec hold - Supine Iliotibial Band Stretch with Strap  - 2 x daily - 7 x weekly - 3 reps - 30 sec hold   ASSESSMENT:  CLINICAL IMPRESSION: Marita [place] "[name]" reports further soreness and stiffness in his L hip since the fall, exacerbated by a hard landing on his L foot when stepping off a curb. Due to this, more limited tolerance for therapeutic exercises  and activities noted as well as slight decline in hip abduction strength relative to previous MMT, although continued gains noted with the remainder of MMT.  Given description of feeling of L hip shifted and mechanism of hard landing on L foot with stepping off a curb, assessed hip and SI joint alignment with no abnormalities found.  HEP updated with stretches to address continued tightness observed at B hips with patient encouraged to resume previously provided stretches as he notes he has not been performing these as often. Given recent issues/setbacks, anticipate [name] will benefit from recert on next visit.  OBJECTIVE IMPAIRMENTS: Abnormal gait, decreased activity tolerance, decreased balance, decreased endurance, decreased knowledge of condition, decreased mobility, difficulty walking, decreased ROM, decreased strength, decreased safety awareness, hypomobility, increased fascial restrictions, impaired perceived functional ability, impaired flexibility, improper body mechanics, postural dysfunction, and pain.   ACTIVITY LIMITATIONS: carrying, lifting, bending, sitting, standing, squatting, sleeping, stairs, transfers, bed mobility, locomotion level, and caring for others  PARTICIPATION LIMITATIONS: meal prep, cleaning, laundry, shopping, community activity, and yard work  PERSONAL FACTORS: Fitness, Past/current experiences, Time since onset of injury/illness/exacerbation, and 3+ comorbidities: ACDF C3-4, C4-5, C5-6 on 07/23/22 2 cervical myelopathy; arthritis; BPH; anemia; thyroid disease - hypothyroidism; MVR & TVR; vertigo; autoimmune hepatitis; HLD  are also affecting patient's functional outcome.   REHAB POTENTIAL: Good  CLINICAL DECISION MAKING: Evolving/moderate complexity  EVALUATION COMPLEXITY: Moderate   GOALS: Goals reviewed with patient? Yes  SHORT TERM GOALS: Target date: 04/17/2023   Patient will be independent with initial HEP. Baseline: Currently completing HH PT HEP Goal  status: MET  04/09/23  2.  Patient will demonstrate improved B hip strength by grossly 1/2 MMT grade for improved ease of mobility and gait stability. Baseline: Refer to above MMT table Goal status: MET  04/22/23  LONG TERM GOALS: Target date: 05/15/2023   Patient will be independent with advanced/ongoing HEP to improve outcomes and carryover.  Baseline:  Goal status: IN PROGRESS  2.  Patient will demonstrate improved B LE strength to >/= 4 to 4+/5 for improved stability and ease of mobility. Baseline: Refer to above MMT table Goal status: IN PROGRESS  05/13/23 - continued L>R hip weakness  3.  Patient will be able to ambulate 600' with or w/o LRAD and normal gait pattern without evidence of Trendelenburg to safely access community.  Baseline: Trendelenburg gait with SPC Goal status: IN PROGRESS  4.  Patient will negotiate stairs reciprocally with normal step pattern w/o limitation due to L hip pain or instability due to weakness. Baseline:  Goal status: IN PROGRESS   5.  Patient will report </= 29/80 on LEFS to demonstrate improved functional ability. Baseline: 38 / 80 = 47.5 % Goal status: IN PROGRESS  6.  Patient will demonstrate at least 23/30 on FGA to decrease risk of falls. Baseline: 19/30 Goal status:  IN PROGRESS   7.  Patient will improve 5x STS time to </= 13.6 seconds to demonstrate improved functional strength and transfer efficiency. Baseline: 18.25 sec Goal status: IN PROGRESS   PLAN:  PT FREQUENCY: 2x/week  PT DURATION: 8 weeks  PLANNED INTERVENTIONS: Therapeutic exercises, Therapeutic activity, Neuromuscular re-education, Balance training, Gait training, Patient/Family education, Self Care, Joint mobilization, Stair training, DME instructions, Dry Needling, Electrical stimulation, Cryotherapy, Moist heat, scar mobilization, Taping, Ultrasound, Ionotophoresis 4mg /ml Dexamethasone, Manual therapy, and Re-evaluation  PLAN FOR NEXT SESSION: Recert - complete LTG  assessment; continue glute and proximal LE strengthening; balance training   Marry Guan, PT, DPT  05/13/2023, 4:03 PM

## 2023-05-14 DIAGNOSIS — Z13220 Encounter for screening for lipoid disorders: Secondary | ICD-10-CM | POA: Diagnosis not present

## 2023-05-14 DIAGNOSIS — Z125 Encounter for screening for malignant neoplasm of prostate: Secondary | ICD-10-CM | POA: Diagnosis not present

## 2023-05-14 DIAGNOSIS — Z0000 Encounter for general adult medical examination without abnormal findings: Secondary | ICD-10-CM | POA: Diagnosis not present

## 2023-05-15 ENCOUNTER — Ambulatory Visit: Payer: 59 | Admitting: Physical Therapy

## 2023-05-15 ENCOUNTER — Encounter: Payer: Self-pay | Admitting: Physical Therapy

## 2023-05-15 DIAGNOSIS — M6281 Muscle weakness (generalized): Secondary | ICD-10-CM | POA: Diagnosis not present

## 2023-05-15 DIAGNOSIS — M25552 Pain in left hip: Secondary | ICD-10-CM | POA: Diagnosis not present

## 2023-05-15 DIAGNOSIS — R2681 Unsteadiness on feet: Secondary | ICD-10-CM | POA: Diagnosis not present

## 2023-05-15 DIAGNOSIS — R2689 Other abnormalities of gait and mobility: Secondary | ICD-10-CM | POA: Diagnosis not present

## 2023-05-15 DIAGNOSIS — R262 Difficulty in walking, not elsewhere classified: Secondary | ICD-10-CM

## 2023-05-15 NOTE — Therapy (Addendum)
OUTPATIENT PHYSICAL THERAPY TREATMENT   Progress Note  Reporting Period 03/20/2023 to 05/15/2023   See note below for Objective Data and Assessment of Progress/Goals.     Patient Name: Alex West MRN: [id number] DOB:Apr 21, 1956, 67 y.o., male Today's Date: 05/15/2023   END OF SESSION:  PT End of Session - 05/15/23 0848     Visit Number 10    Date for PT Re-Evaluation 05/15/23    Authorization Type [employer] - VL:30 (PT/OT/Chiro combined)    PT Start Time 0848    PT Stop Time 0931    PT Time Calculation (min) 43 min    Activity Tolerance Patient tolerated treatment well    Behavior During Therapy WFL for tasks assessed/performed               Past Medical History:  Diagnosis Date   Arthritis    BPH (benign prostatic hyperplasia)    Colon polyp    ED (erectile dysfunction)    Elevated liver enzymes    Gallstones    Hepatitis    History of anemia    Hypercholesterolemia    Hypothyroidism    Mild tricuspid regurgitation    Mitral valve regurgitation    Thyroid disease    Vertigo    Past Surgical History:  Procedure Laterality Date   ANTERIOR CERVICAL DECOMP/DISCECTOMY FUSION N/A 07/23/2022   Procedure: Anterior Cervical Discectomy and Fusion Cervical Three-Four/Four-Five/Five-Six;  Surgeon: Bedelia Person, MD;  Location: [facility] OR;  Service: Neurosurgery;  Laterality: N/A;  3C   FLEXIBLE SIGMOIDOSCOPY     TOTAL HIP ARTHROPLASTY Left 01/06/2023   Procedure: LEFT TOTAL HIP ARTHROPLASTY ANTERIOR APPROACH;  Surgeon: Gean Birchwood, MD;  Location: [facility] ORS;  Service: Orthopedics;  Laterality: Left;   Patient Active Problem List   Diagnosis Date Noted   UTI (urinary tract infection) 01/24/2023   Sepsis (HCC) 01/24/2023   Bacteremia 01/24/2023   AKI (acute kidney injury) (HCC) 01/23/2023   Arthritis of left hip 01/01/2023   Paresthesia 08/21/2022   Stenosis of cervical spine with myelopathy (HCC) 07/23/2022   Gait abnormality 06/18/2022   Cervical myelopathy  (HCC) 06/17/2022   Debility 06/06/2022   Neck pain 06/06/2022   OA (osteoarthritis) of hip 07/24/2021   Immunosuppressed status (HCC) 09/05/2014   Hepatitis, autoimmune (HCC) 09/05/2014    PCP: Daisy Floro, MD   REFERRING PROVIDER: Gean Birchwood, MD   REFERRING DIAG: M25.559 (ICD-10-CM) - Hip pain   THERAPY DIAG:  Muscle weakness (generalized)  Other abnormalities of gait and mobility  Difficulty in walking, not elsewhere classified  Unsteadiness on feet  Pain in left hip  RATIONALE FOR EVALUATION AND TREATMENT: Rehabilitation  ONSET DATE: 01/06/23 - Anterior L total hip arthroplasty   NEXT MD VISIT: 4 months (saw MD 1 week prior to PT eval)   SUBJECTIVE:  SUBJECTIVE STATEMENT: Pt reports his recent fall and subsequent misstep coming off a curb seems to have set him back. We had planned for recert today, but he decided that he wants to try transitioning to his HEP for now as he wants to f/u with neurology as he feels that he is having radicular nerve issues from his back affecting his posterior R thigh that is inhibiting his progress.  PAIN: Are you having pain? Yes: NPRS scale:  5/10 Pain location: L hip Pain description: soreness and stiffness Aggravating factors: unknown Relieving factors: unknown  PERTINENT HISTORY:  L THA 01/06/23 2 L hip pain - severe OA; ACDF C3-4, C4-5, C5-6 on 07/23/22 2 cervical myelopathy; arthritis; BPH; anemia; thyroid disease - hypothyroidism; MVR & TVR; vertigo; autoimmune hepatitis; HLD  PRECAUTIONS: None  WEIGHT BEARING RESTRICTIONS: No  FALLS:  Has patient fallen in last 6 months? No  LIVING ENVIRONMENT: Lives with: lives with their spouse Lives in: House/apartment Stairs: Yes: Internal: 14 steps; on right going up and  External: 3 steps; on left going up Has following equipment at home: Single point cane and Walker - 2 wheeled  OCCUPATION: Retired  PLOF: Independent and Leisure: play golf, gym 5x/wk    PATIENT GOALS: "strength & flexibility"    OBJECTIVE: (objective measures completed at initial evaluation unless otherwise dated)  DIAGNOSTIC FINDINGS:  01/23/23 - L hip x-ray:  FINDINGS: Status post left total hip arthroplasty. No periprosthetic lucency to suggest loosening. No acute fracture, dislocation or subluxation. Pelvic ring intact.   IMPRESSION: Unremarkable appearance status post left total hip arthroplasty.  01/23/23: Cervical/Thoracic/Lumbar MRI: IMPRESSION: C3-6 ACDF with unchanged severe spinal canal stenosis at C3-4 and focal myelomalacia. Unchanged moderate bilateral C5-6 neural foraminal stenosis. Mild multilevel thoracic degenerative disc disease with mild spinal canal stenosis at T9-10. Unchanged mild spinal canal stenosis at L2-3, L3-4 and L4-5. Unchanged mild right L4-5 neural foraminal stenosis.   PATIENT SURVEYS:  LEFS 38 / 80 = 47.5 %  COGNITION: Overall cognitive status: Within functional limits for tasks assessed    SENSATION: WFL Numbness and tingling in B hands  MUSCLE LENGTH: Hamstrings: mod tight B ITB: mild tight B Piriformis: mod/severe tight B Hip flexors: mod tight B Quads: mod tight B Heelcord: NT  POSTURE:  rounded shoulders and forward head  LOWER EXTREMITY ROM: B hip ROM limited in all motions, knee and ankles [facility]  LOWER EXTREMITY MMT:  MMT Right eval Left eval Right 04/22/23 Left 04/22/23 Right 05/13/23 Left 05/13/23  Hip flexion 4+ 4 5 4+ 4+ 4+  Hip extension 3- 2+ 3+ 3+ 4 3+  Hip abduction 4 2+ 4 3+ 4 3-  Hip adduction 4- 4   4+ 4  Hip internal rotation 4 4   4+ 4+  Hip external rotation 4- 2+ 4+ 4- 4 3+  Knee flexion 4+ 4 4+ 4+ 4+ 4+  Knee extension 5 5   5 5   Ankle dorsiflexion 5 5   5 5   Ankle plantarflexion 4  13 SLS HR 4 14  SLS HR      Ankle inversion        Ankle eversion         (Blank rows = not tested)  FUNCTIONAL TESTS:  5 times sit to stand: 18.25 sec Timed up and go (TUG): 12.56 sec 10 meter walk test: 10.19 sec Functional gait assessment: 19/30 ; 19-24 = medium risk fall   05/15/23: 5xSTS = 16.69 sec = 10.16 Gait speed = 3.23  ft/sec FGA = 23/30, 19-24 = medium risk fall   GAIT: Distance walked: 80 ft Assistive device utilized: Single point cane and None Level of assistance: Modified independence Gait pattern: step through pattern and trendelenburg Comments: gait speed = 3.22 ft/sec  STAIRS: Level of Assistance: Modified independence Stair Negotiation Technique: Alternating Pattern  with Single Rail on Right Number of Stairs: 14  Height of Stairs: 7  Comments: Hip instability noted   TODAY'S TREATMENT:    05/15/23 THERAPEUTIC EXERCISE: to improve flexibility, strength and mobility.  Verbal and tactile cues throughout for technique.  UBE L3 x 6 min Brief review of HEP with pt denying any questions or concerns  THERAPEUTIC ACTIVITIES: 5xSTS = 16.69 sec = 10.16 Gait speed = 3.23 ft/sec FGA = 23/30, 19-24 = medium risk fall  Review of floor transfers LEFS = 30 / 80 = 37.5 %   05/13/23 THERAPEUTIC EXERCISE: to improve flexibility, strength and mobility.  Verbal and tactile cues throughout for technique.  UBE L3 x 6 min Hooklying L/R SKTC stretch 2 x 30" Hooklying L/R KTOS piriformis stretch 2 x 30" Hooklying L/R figure-4 piriformis stretch 2 x 30"  THERAPEUTIC ACTIVITIES: LE MMT LE flexibility assessment  MANUAL THERAPY: To promote normalized muscle tension, improved flexibility, and reduced pain.  STM/DTM to L lateral quads and R distal HS   05/06/23  Therapeutic Exercise: to improve strength and mobility.  Demo, verbal and tactile cues throughout for technique. Bike L1 x 6 min Single leg RDLS x 10 bil with 1 UE support on counter Review HEP - continue  working on squats and leg extension for glut strengthening Neuromuscular Reeducation: to improve balance and stability. SBA for safety throughout.  In corner on airex: Eyes open feet apart x 30 sec  Eyes open feet apart with head nods x 10 Eyes open feet apart with head turns x 10 Eyes closed feet apart x 30 sec Eyes closed feet apart with head nods x 10 Eyes closed feet apart with head turns x 10  In corner on Level surface: ankle sways x 20 Wall bumps 2 x 10 progressively increasing distance from wall.  Manual Therapy: to decrease muscle spasm and pain and improve mobility IASTM with foam roller to bil glutes,hamstrings and calves    PATIENT EDUCATION:  Education details: HEP review and recommended frequency for ongoing HEP at discharge to prevent loss of gains achieved with PT Person educated: Patient Education method: Explanation Education comprehension: verbalized understanding  HOME EXERCISE PROGRAM: Access Code: [email or web address] URL: [email or web address] Date: 05/13/2023 Prepared by: Glenetta Hew  Exercises - Supine Quadriceps Stretch with Strap on Table  - 1 x daily - 7 x weekly - 3 sets - 30 sec hold - Supine Bridge with Knee Extension and Pelvic Floor Contraction  - 1 x daily - 7 x weekly - 3 sets - 10 reps - Clamshell  - 1 x daily - 7 x weekly - 3 sets - 10 reps - Prone Hip Extension  - 1 x daily - 7 x weekly - 3 sets - 10 reps - Seated Hamstring Stretch  - 1 x daily - 7 x weekly - 3 sets - 30 sec hold - Forward T with Counter Support  - 1 x daily - 7 x weekly - 3 sets - 10 reps - Sidestepping  - 1 x daily - 7 x weekly - 3 sets - 10 reps - Retro Step  - 1 x daily - 7 x weekly - 3 sets -  10 reps - Gastroc Stretch on Wall  - 1 x daily - 7 x weekly - 3 sets - 30 sec hold - Sidelying Hip Abduction  - 1 x daily - 3-4 x weekly - 3 sets - 10 reps - Standing Partial Lunge  - 1 x daily - 7 x weekly - 3 sets - 10 reps - Supine Piriformis Stretch with Foot on Ground  - 2  x daily - 7 x weekly - 3 reps - 30 sec hold - Supine Figure 4 Piriformis Stretch  - 2 x daily - 7 x weekly - 3 reps - 30 sec hold - Supine Iliotibial Band Stretch with Strap  - 2 x daily - 7 x weekly - 3 reps - 30 sec hold   ASSESSMENT:  CLINICAL IMPRESSION: Alex "[name]" opting not to proceed with anticipated recert today, as he would like to f/u with his neurologist/neurosurgeon regarding what he perceives to be worsening radiculopathy in his posterior thighs/hamstrings which he feels is limiting his current progress with PT interventions for his hip. As such, completed goal assessment today along with overview of recommended ongoing HEP and floor transfers at pt request. PT goals only partially met, however [name] feels ready to transition to his HEP pending his plan to f/u with neuro, but would like to remain on hold for 30-days in the event that issues arise that would necessitate a return to PT.  OBJECTIVE IMPAIRMENTS: Abnormal gait, decreased activity tolerance, decreased balance, decreased endurance, decreased knowledge of condition, decreased mobility, difficulty walking, decreased ROM, decreased strength, decreased safety awareness, hypomobility, increased fascial restrictions, impaired perceived functional ability, impaired flexibility, improper body mechanics, postural dysfunction, and pain.   ACTIVITY LIMITATIONS: carrying, lifting, bending, sitting, standing, squatting, sleeping, stairs, transfers, bed mobility, locomotion level, and caring for others  PARTICIPATION LIMITATIONS: meal prep, cleaning, laundry, shopping, community activity, and yard work  PERSONAL FACTORS: Fitness, Past/current experiences, Time since onset of injury/illness/exacerbation, and 3+ comorbidities: ACDF C3-4, C4-5, C5-6 on 07/23/22 2 cervical myelopathy; arthritis; BPH; anemia; thyroid disease - hypothyroidism; MVR & TVR; vertigo; autoimmune hepatitis; HLD  are also affecting patient's functional outcome.    REHAB POTENTIAL: Good  CLINICAL DECISION MAKING: Evolving/moderate complexity  EVALUATION COMPLEXITY: Moderate   GOALS: Goals reviewed with patient? Yes  SHORT TERM GOALS: Target date: 04/17/2023   Patient will be independent with initial HEP. Baseline: Currently completing HH PT HEP Goal status: MET  04/09/23  2.  Patient will demonstrate improved B hip strength by grossly 1/2 MMT grade for improved ease of mobility and gait stability. Baseline: Refer to above MMT table Goal status: MET  04/22/23  LONG TERM GOALS: Target date: 05/15/2023   Patient will be independent with advanced/ongoing HEP to improve outcomes and carryover.  Baseline:  Goal status: MET  2.  Patient will demonstrate improved B LE strength to >/= 4 to 4+/5 for improved stability and ease of mobility. Baseline: Refer to above MMT table Goal status: PARTIALLY MET  05/13/23 - continued L>R hip weakness  3.  Patient will be able to ambulate 600' with or w/o LRAD and normal gait pattern without evidence of Trendelenburg to safely access community.  Baseline: Trendelenburg gait with SPC Goal status: PARTIALLY MET  05/15/23 - Pt able to ambulate w/o AD for limited distances but still relies on [facility] for community ambulation. Slight trendelenburg still evident with fatigue.  4.  Patient will negotiate stairs reciprocally with normal step pattern w/o limitation due to L hip pain or instability  due to weakness. Baseline:  Goal status: MET  05/15/23   5.  Patient will report </= 29/80 on LEFS to demonstrate improved functional ability. Baseline: 38 / 80 = 47.5% Goal status: PARTIALLY MET  05/15/23 - improved to 30 / 80 = 37.5% but 1 point shy of goal  6.  Patient will demonstrate at least 23/30 on FGA to decrease risk of falls. Baseline: 19/30 Goal status: MET  05/15/23 - 23/30  7.  Patient will improve 5x STS time to </= 13.6 seconds to demonstrate improved functional strength and transfer efficiency. Baseline: 18.25  sec Goal status: NOT MET  05/15/23 - 16.69 sec   PLAN:  PT FREQUENCY: 2x/week  PT DURATION: 8 weeks  PLANNED INTERVENTIONS: Therapeutic exercises, Therapeutic activity, Neuromuscular re-education, Balance training, Gait training, Patient/Family education, Self Care, Joint mobilization, Stair training, DME instructions, Dry Needling, Electrical stimulation, Cryotherapy, Moist heat, scar mobilization, Taping, Ultrasound, Ionotophoresis 4mg /ml Dexamethasone, Manual therapy, and Re-evaluation  PLAN FOR NEXT SESSION: Transition to HEP + 30-day hold   Marry Guan, PT 05/15/2023, 1:13 PM  PHYSICAL THERAPY DISCHARGE SUMMARY  Visits from Start of Care: 10  Current functional level related to goals / functional outcomes: Improved L hip strength and pain, however goals only partially met due to recent fall   Remaining deficits: See above, continued weakness   Education / Equipment: HEP  Plan: Patient agrees to discharge.  Patient goals were not met. Refer to above clinical impression and goal assessment for status as of last visit on 05/15/23. Patient was placed on hold for 30 days and has not needed to return to PT, therefore will proceed with discharge from PT for this episode.     Jena Gauss, PT  07/31/2023 2:13 PM

## 2023-05-20 ENCOUNTER — Ambulatory Visit: Payer: Medicare Other | Admitting: Physical Therapy

## 2023-05-21 DIAGNOSIS — Z0000 Encounter for general adult medical examination without abnormal findings: Secondary | ICD-10-CM | POA: Diagnosis not present

## 2023-05-21 DIAGNOSIS — E7800 Pure hypercholesterolemia, unspecified: Secondary | ICD-10-CM | POA: Diagnosis not present

## 2023-05-21 DIAGNOSIS — Z6831 Body mass index (BMI) 31.0-31.9, adult: Secondary | ICD-10-CM | POA: Diagnosis not present

## 2023-05-21 DIAGNOSIS — E039 Hypothyroidism, unspecified: Secondary | ICD-10-CM | POA: Diagnosis not present

## 2023-05-21 DIAGNOSIS — R531 Weakness: Secondary | ICD-10-CM | POA: Diagnosis not present

## 2023-05-21 DIAGNOSIS — Z862 Personal history of diseases of the blood and blood-forming organs and certain disorders involving the immune mechanism: Secondary | ICD-10-CM | POA: Diagnosis not present

## 2023-05-21 DIAGNOSIS — R202 Paresthesia of skin: Secondary | ICD-10-CM | POA: Diagnosis not present

## 2023-05-30 ENCOUNTER — Encounter: Payer: 59 | Admitting: Physical Therapy

## 2023-06-04 ENCOUNTER — Encounter: Payer: 59 | Admitting: Physical Therapy

## 2023-06-10 ENCOUNTER — Encounter: Payer: 59 | Admitting: Physical Therapy

## 2023-07-01 IMAGING — DX DG HIP (WITH OR WITHOUT PELVIS) 2-3V*L*
3 series · 3 of 3 positions shown · non-contrast
Comparison: None.

CLINICAL DATA: Left anterior hip pain x6 weeks.

EXAM:
DG HIP (WITH OR WITHOUT PELVIS) 2-3V LEFT

[pelvis ap]
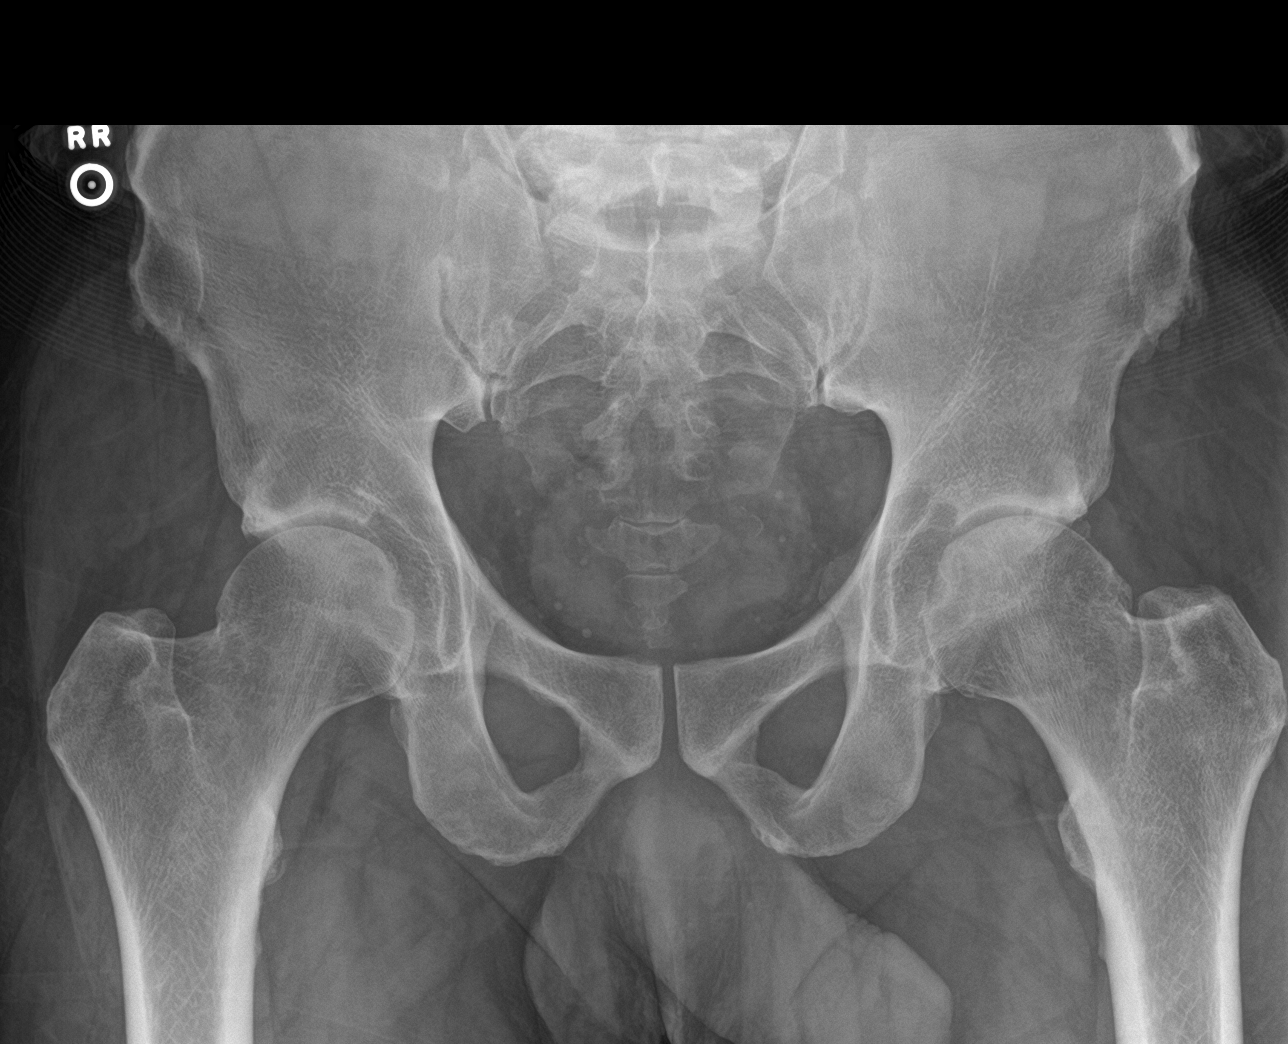

[hip ap]
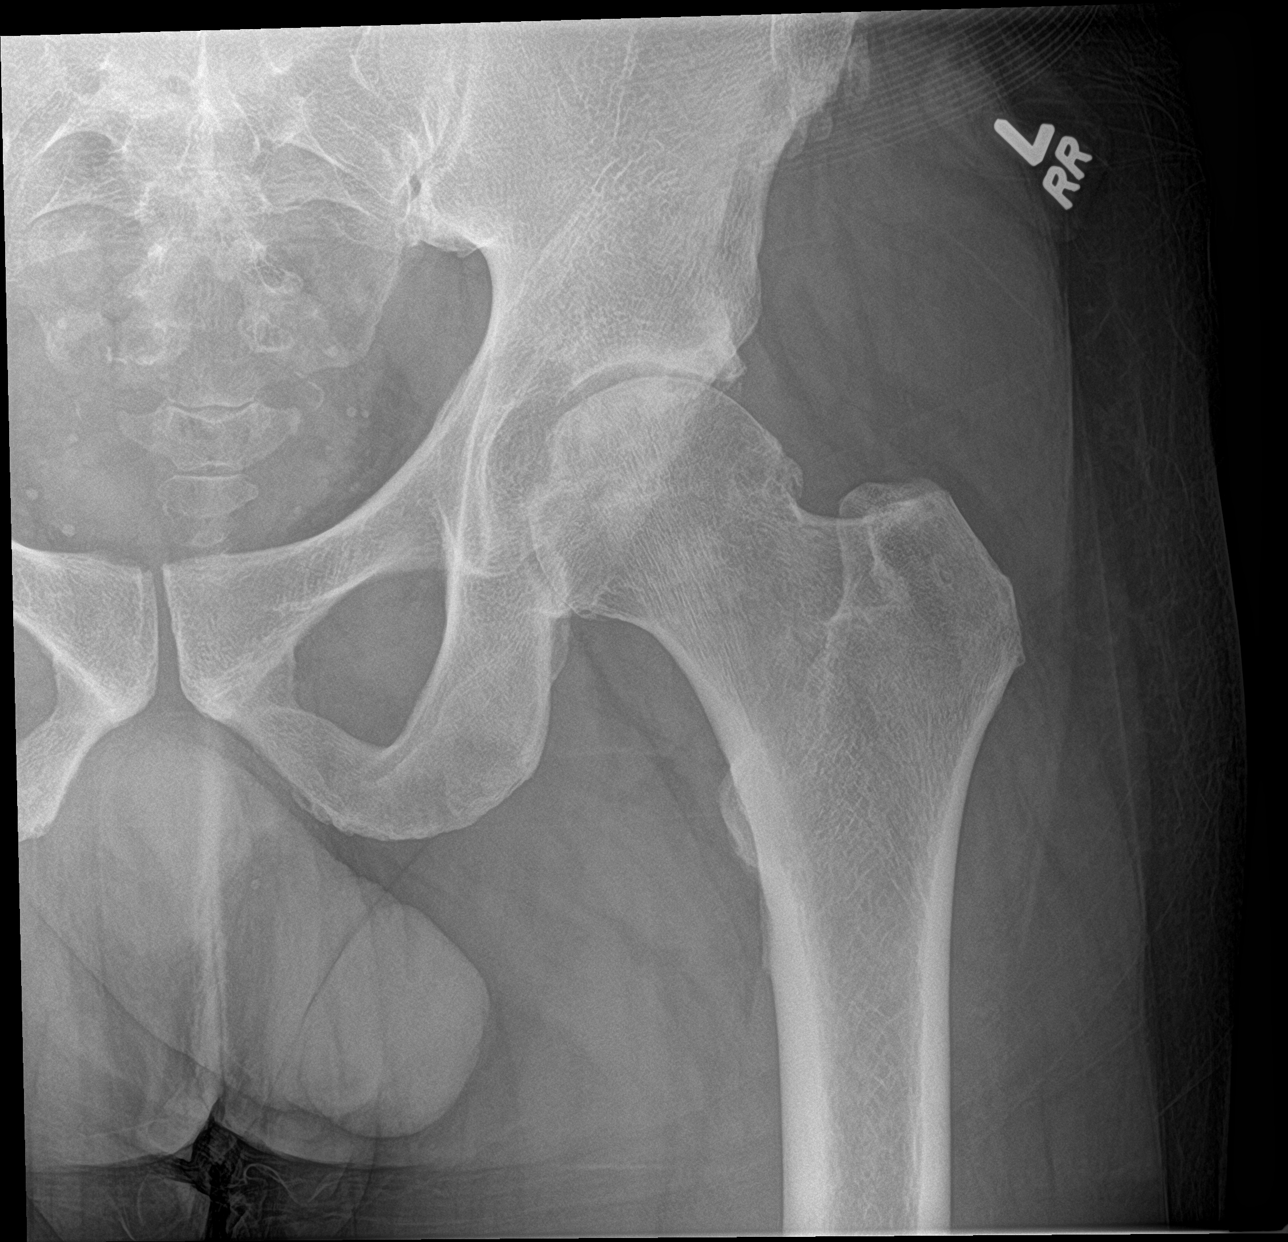

[hip lat]
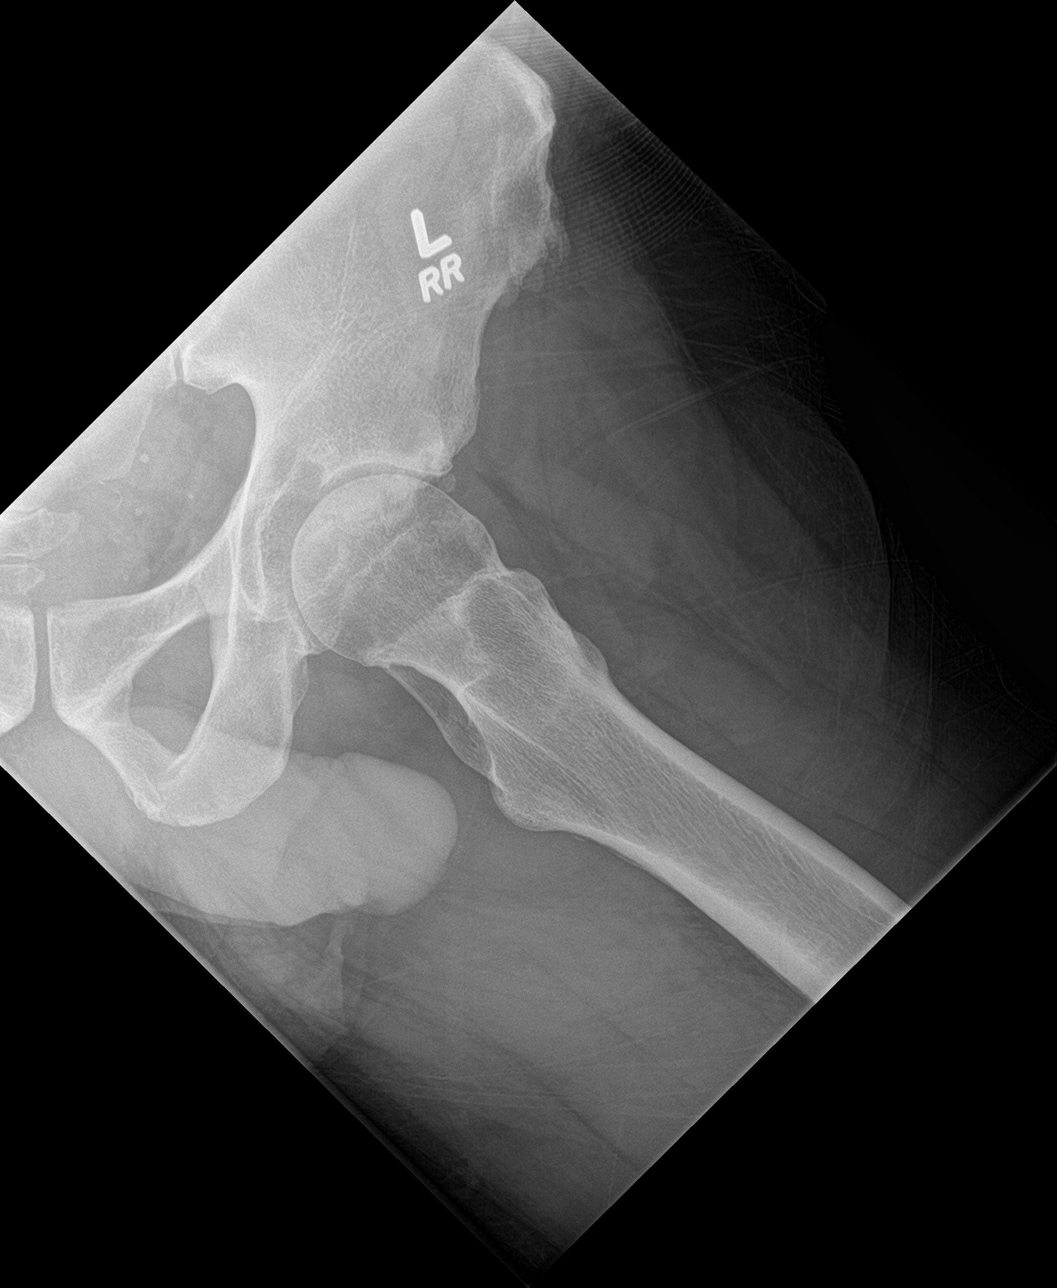

[3 of 3 positions shown; findings below may reference images not displayed]

FINDINGS: There is no evidence of hip fracture or dislocation. Mild
osteoarthritis of the hip joints. No focal osseous lesion.
IMPRESSION: No acute abnormality.  Mild osteoarthritis of the hip joints.

## 2023-07-10 DIAGNOSIS — G629 Polyneuropathy, unspecified: Secondary | ICD-10-CM | POA: Diagnosis not present

## 2023-07-29 DIAGNOSIS — M7061 Trochanteric bursitis, right hip: Secondary | ICD-10-CM | POA: Diagnosis not present

## 2023-07-29 DIAGNOSIS — M25552 Pain in left hip: Secondary | ICD-10-CM | POA: Diagnosis not present

## 2023-09-09 DIAGNOSIS — R3914 Feeling of incomplete bladder emptying: Secondary | ICD-10-CM | POA: Diagnosis not present

## 2023-09-09 DIAGNOSIS — R350 Frequency of micturition: Secondary | ICD-10-CM | POA: Diagnosis not present

## 2023-09-09 DIAGNOSIS — N401 Enlarged prostate with lower urinary tract symptoms: Secondary | ICD-10-CM | POA: Diagnosis not present

## 2023-09-11 DIAGNOSIS — R42 Dizziness and giddiness: Secondary | ICD-10-CM | POA: Diagnosis not present

## 2023-09-11 DIAGNOSIS — D849 Immunodeficiency, unspecified: Secondary | ICD-10-CM | POA: Diagnosis not present

## 2023-09-11 DIAGNOSIS — Z6831 Body mass index (BMI) 31.0-31.9, adult: Secondary | ICD-10-CM | POA: Diagnosis not present

## 2023-09-11 DIAGNOSIS — K754 Autoimmune hepatitis: Secondary | ICD-10-CM | POA: Diagnosis not present

## 2023-09-11 DIAGNOSIS — D649 Anemia, unspecified: Secondary | ICD-10-CM | POA: Diagnosis not present

## 2023-09-11 DIAGNOSIS — I959 Hypotension, unspecified: Secondary | ICD-10-CM | POA: Diagnosis not present

## 2023-09-16 ENCOUNTER — Encounter: Payer: Self-pay | Admitting: Podiatry

## 2023-09-16 ENCOUNTER — Ambulatory Visit: Payer: Medicare Other | Admitting: Podiatry

## 2023-09-16 VITALS — Ht 75.0 in | Wt 238.0 lb

## 2023-09-16 DIAGNOSIS — G629 Polyneuropathy, unspecified: Secondary | ICD-10-CM

## 2023-09-16 DIAGNOSIS — R269 Unspecified abnormalities of gait and mobility: Secondary | ICD-10-CM

## 2023-09-16 DIAGNOSIS — M5417 Radiculopathy, lumbosacral region: Secondary | ICD-10-CM | POA: Diagnosis not present

## 2023-09-16 NOTE — Progress Notes (Signed)
Chief Complaint  Patient presents with   Numbness    Patient is here for neuropathy bilateral   HPI: 67 y.o. male presents today with concern of peripheral neuropathy of both feet, right worse than left.  He states the symptoms began immediately after undergoing an anterior cervical discectomy with fusion on 07/23/2022 by Dr. Vernelle Emerald.  He states that he underwent the surgery for some numbness he was experiencing near his axilla area on the right.  Following surgery, his right arm and hand have had consistent paresthesia and he also has numbness and tingling to both feet with the right foot being worse than the left.  He notes this is affecting his balance and he has had to walk with a cane.  He also underwent a left total hip surgery on 01/06/2023.  He states prior to these surgeries he was extremely active and exercised regularly.  He notes these current symptoms since the cervical discectomy have affected his activities of daily living and quality of life.  He would like to find answers and possible treatment options for his symptoms so he can try to return to normal activities and resume exercise.  He does note a family history of lupus, stating his daughter had lupus and is deceased.  He also notes a current diagnosis of autoimmune liver disease and takes Imuran regularly for this.  Past Medical History:  Diagnosis Date   Arthritis    BPH (benign prostatic hyperplasia)    Colon polyp    ED (erectile dysfunction)    Elevated liver enzymes    Gallstones    Hepatitis    History of anemia    Hypercholesterolemia    Hypothyroidism    Mild tricuspid regurgitation    Mitral valve regurgitation    Thyroid disease    Vertigo     Past Surgical History:  Procedure Laterality Date   ANTERIOR CERVICAL DECOMP/DISCECTOMY FUSION N/A 07/23/2022   Procedure: Anterior Cervical Discectomy and Fusion Cervical Three-Four/Four-Five/Five-Six;  Surgeon: Bedelia Person, MD;  Location: [facility] OR;   Service: Neurosurgery;  Laterality: N/A;  3C   FLEXIBLE SIGMOIDOSCOPY     TOTAL HIP ARTHROPLASTY Left 01/06/2023   Procedure: LEFT TOTAL HIP ARTHROPLASTY ANTERIOR APPROACH;  Surgeon: Gean Birchwood, MD;  Location: [facility] ORS;  Service: Orthopedics;  Laterality: Left;    Allergies  Allergen Reactions   Tadalafil Other (See Comments)    Headache     Physical Exam: General: The patient is alert and oriented x3 in no acute distress.  Dermatology: Skin is warm, dry and supple bilateral lower extremities. Interspaces are clear of maceration and debris.    Vascular: Palpable pedal pulses bilaterally. Capillary refill within normal limits.  No appreciable edema.  No erythema or calor.  Neurological: Light touch sensation grossly intact bilateral feet.  Protective sensation intact with a Semmes Weinstein monofilament.  Vibratory sensation intact.  Temperature sensation intact to the forefoot and midfoot.  Negative Tinel's sign with percussion of the posterior tibial nerve bilateral  Musculoskeletal Exam: No significant pedal deformities noted bilateral.  Manual muscle testing 5/5, with exception of anterior tibial group on the right leg which was -5/5.  No spasticity of the muscles noted.  Ankle dorsiflexion less than 10 degrees with the knee extended bilateral.  -----------------------------------------------------------------------------------------------------   EMG/NCV results:  Visit Date:                         12/18/2022 08:17 Age:  68 Years Examining Physician:      Dr. Levert Feinstein Referring Physician:        Dr. Levert Feinstein Height:                              6 feet 3 inch History: 67 year old male, with history of severe cervical stenosis involving C3-4, 5 6, status post cervical decompression surgery, with persistent upper extremity paresthesia, gait abnormality, repeat MRI of cervical spine showed cervical myelomalacia   Summary of the test:   Nerve  conduction study: Right sural, superficial peroneal, sensory responses were normal.   Left median, ulnar sensory responses were normal.   Right median, ulnar sensory responses showed normal peak latency, with slightly decreased snap amplitude.   Right tibial motor responses showed mildly decreased CMAP amplitude, was otherwise normal. Right peroneal to EDB motor responses were normal.   Bilateral ulnar and median motor responses were normal   Electromyography: Selected needle examination were performed at right upper, lower extremity muscles, right cervical and lumbosacral paraspinal muscles. There is evidence of chronic neuropathic changes involving right C5, 6, 7 myotomes.  There is mildly increased insertional activity at lower cervical paraspinal muscles.   There is also evidence of chronic neuropathic changes involving right L4-5 facet myotomes.  There was no spontaneous activity at right lumbosacral paraspinal muscles.     Conclusion: This is an abnormal study.  There is electrodiagnostic evidence of chronic mild right C5-6-7 cervical radiculopathy, and also chronic right L4-5 S1 lumbar radiculopathy.  There is no evidence of large fiber peripheral neuropathy, or upper extremity focal neuropathy.       ------------------------------- Forrestine Him.D.Ph.D.   [facility] [address] [place], [place] [address] Tel: [phone] Fax: [phone] ---------------------------------------------------------------------------------------------------  Assessment/Plan of Care: 1. Neuropathy   2. Lumbosacral radiculopathy at L5   3. Gait disturbance     Discussed clinical findings with patient today.  Time was spent reviewing his past cervical surgery history as well as obtaining and reviewing the previous EMG/NCV results.  Discussed his past EMG/NCV results indicating the L4-5 and S1 radiculopathy.  Informed the patient that the lumbosacral issue would not  be expected following cervical surgery.  Due to the history of autoimmune disorders, we will set the patient up for blood work to rule out any other types of connective tissue disorders himself.  He was agreeable to this.  A CBC with differential, ESR, CRP, ANA titer, HLA-B27 tests were ordered.  Based on the results, we may refer to rheumatology for more specific workup if positive.  Also may order MRI of the lumbosacral spine if the blood work is negative.  The patient states he has done a lot of research on his own and feels that his symptoms correspond with central neuropathy.  Although this is a summarization of multiple neurological symptoms, we still need to see if we can find the cause/source of the symptoms.  Will also need to keep in mind that a rare side effect of the Imuran medication is progressive multifocal leukoencephalopathy, which is caused by the JC virus in weakened immune systems, which can cause progressive weakness, visual, speech and occasional personality changes.  This diagnosis is made via finding the JC virus DNA and spinal fluid together with white matter lesions on a brain MRI.   Clerance Lav, DPM, FACFAS [facility]     [address]  [place], [place] [address]                Office [phone]  Fax [phone]

## 2023-09-21 LAB — ANA SCREEN,IFA,REFLEX TITER/PATTERN,REFLEX MPLX 11 AB CASCADE
Anti Nuclear Antibody (ANA): POSITIVE — AB
Cyclic Citrullin Peptide Ab: 20 U — ABNORMAL HIGH
MUTATED CITRULLINATED VIMENTIN (MCV) AB: <20 U/mL (ref <20)
Rheumatoid fact SerPl-aCnc: <10 [IU]/mL (ref <14)

## 2023-09-21 LAB — TIER 1
Chromatin (Nucleosomal) Antibody: <1 AI
ENA SM Ab Ser-aCnc: <1 AI
Ribonucleic Protein(ENA) Antibody, IgG: 2 AI — AB
SM/RNP: <1 AI
ds DNA Ab: 1 [IU]/mL

## 2023-09-21 LAB — HLA-B27 ANTIGEN: HLA-B27 Antigen: NEGATIVE

## 2023-09-21 LAB — CBC WITH DIFFERENTIAL/PLATELET
Absolute Monocytes: 686 {cells}/uL (ref 200–950)
Basophils Absolute: 29 {cells}/uL (ref 0–200)
Basophils Relative: 0.6 %
Eosinophils Absolute: 113 {cells}/uL (ref 15–500)
Eosinophils Relative: 2.3 %
HCT: 39.2 % (ref 38.5–50.0)
Hemoglobin: 12.9 g/dL — ABNORMAL LOW (ref 13.2–17.1)
Lymphs Abs: 1450 {cells}/uL (ref 850–3900)
MCH: 31.9 pg (ref 27.0–33.0)
MCHC: 32.9 g/dL (ref 32.0–36.0)
MCV: 96.8 fL (ref 80.0–100.0)
MPV: 11.1 fL (ref 7.5–12.5)
Monocytes Relative: 14 %
Neutro Abs: 2622 {cells}/uL (ref 1500–7800)
Neutrophils Relative %: 53.5 %
Platelets: 165 10*3/uL (ref 140–400)
RBC: 4.05 10*6/uL — ABNORMAL LOW (ref 4.20–5.80)
RDW: 12.5 % (ref 11.0–15.0)
Total Lymphocyte: 29.6 %
WBC: 4.9 10*3/uL (ref 3.8–10.8)

## 2023-09-21 LAB — ANTI-NUCLEAR AB-TITER (ANA TITER)
ANA TITER: 1:320 {titer} — ABNORMAL HIGH
ANA Titer 1: 1:80 {titer} — ABNORMAL HIGH

## 2023-09-21 LAB — SEDIMENTATION RATE: Sed Rate: 6 mm/h (ref 0–20)

## 2023-09-21 LAB — INTERPRETATION

## 2023-09-22 ENCOUNTER — Telehealth: Payer: Self-pay | Admitting: Podiatry

## 2023-09-22 NOTE — Telephone Encounter (Signed)
Called patient to review lab results.  Will place urgent referral to Rheumatology today.

## 2023-09-22 NOTE — Addendum Note (Signed)
Addended byLucia Estelle D on: 09/22/2023 10:33 AM   Modules accepted: Orders

## 2023-09-29 DIAGNOSIS — M25552 Pain in left hip: Secondary | ICD-10-CM | POA: Diagnosis not present

## 2023-09-29 DIAGNOSIS — M542 Cervicalgia: Secondary | ICD-10-CM | POA: Diagnosis not present

## 2023-09-30 DIAGNOSIS — M25552 Pain in left hip: Secondary | ICD-10-CM | POA: Diagnosis not present

## 2023-10-02 DIAGNOSIS — Z23 Encounter for immunization: Secondary | ICD-10-CM | POA: Diagnosis not present

## 2023-10-02 DIAGNOSIS — K754 Autoimmune hepatitis: Secondary | ICD-10-CM | POA: Diagnosis not present

## 2023-10-02 DIAGNOSIS — M542 Cervicalgia: Secondary | ICD-10-CM | POA: Diagnosis not present

## 2023-10-02 DIAGNOSIS — K5900 Constipation, unspecified: Secondary | ICD-10-CM | POA: Diagnosis not present

## 2023-10-02 DIAGNOSIS — G629 Polyneuropathy, unspecified: Secondary | ICD-10-CM | POA: Diagnosis not present

## 2023-10-02 DIAGNOSIS — M25552 Pain in left hip: Secondary | ICD-10-CM | POA: Diagnosis not present

## 2023-10-06 DIAGNOSIS — M25552 Pain in left hip: Secondary | ICD-10-CM | POA: Diagnosis not present

## 2023-10-06 DIAGNOSIS — M542 Cervicalgia: Secondary | ICD-10-CM | POA: Diagnosis not present

## 2023-10-09 DIAGNOSIS — M542 Cervicalgia: Secondary | ICD-10-CM | POA: Diagnosis not present

## 2023-10-09 DIAGNOSIS — M25552 Pain in left hip: Secondary | ICD-10-CM | POA: Diagnosis not present

## 2023-10-13 DIAGNOSIS — M25552 Pain in left hip: Secondary | ICD-10-CM | POA: Diagnosis not present

## 2023-10-13 DIAGNOSIS — M542 Cervicalgia: Secondary | ICD-10-CM | POA: Diagnosis not present

## 2023-10-16 DIAGNOSIS — M25552 Pain in left hip: Secondary | ICD-10-CM | POA: Diagnosis not present

## 2023-10-16 DIAGNOSIS — M542 Cervicalgia: Secondary | ICD-10-CM | POA: Diagnosis not present

## 2023-10-21 DIAGNOSIS — M542 Cervicalgia: Secondary | ICD-10-CM | POA: Diagnosis not present

## 2023-10-21 DIAGNOSIS — M25552 Pain in left hip: Secondary | ICD-10-CM | POA: Diagnosis not present

## 2023-10-23 DIAGNOSIS — M25552 Pain in left hip: Secondary | ICD-10-CM | POA: Diagnosis not present

## 2023-10-23 DIAGNOSIS — M542 Cervicalgia: Secondary | ICD-10-CM | POA: Diagnosis not present

## 2023-10-28 DIAGNOSIS — M25552 Pain in left hip: Secondary | ICD-10-CM | POA: Diagnosis not present

## 2023-10-28 DIAGNOSIS — M542 Cervicalgia: Secondary | ICD-10-CM | POA: Diagnosis not present

## 2023-10-30 DIAGNOSIS — M25552 Pain in left hip: Secondary | ICD-10-CM | POA: Diagnosis not present

## 2023-10-30 DIAGNOSIS — M542 Cervicalgia: Secondary | ICD-10-CM | POA: Diagnosis not present

## 2023-11-04 DIAGNOSIS — M542 Cervicalgia: Secondary | ICD-10-CM | POA: Diagnosis not present

## 2023-11-04 DIAGNOSIS — M25552 Pain in left hip: Secondary | ICD-10-CM | POA: Diagnosis not present

## 2023-11-11 DIAGNOSIS — M25552 Pain in left hip: Secondary | ICD-10-CM | POA: Diagnosis not present

## 2023-11-11 DIAGNOSIS — M542 Cervicalgia: Secondary | ICD-10-CM | POA: Diagnosis not present

## 2023-11-13 DIAGNOSIS — M542 Cervicalgia: Secondary | ICD-10-CM | POA: Diagnosis not present

## 2023-11-13 DIAGNOSIS — M25552 Pain in left hip: Secondary | ICD-10-CM | POA: Diagnosis not present

## 2023-11-18 DIAGNOSIS — M542 Cervicalgia: Secondary | ICD-10-CM | POA: Diagnosis not present

## 2023-11-18 DIAGNOSIS — M25552 Pain in left hip: Secondary | ICD-10-CM | POA: Diagnosis not present

## 2023-11-19 DIAGNOSIS — D84821 Immunodeficiency due to drugs: Secondary | ICD-10-CM | POA: Diagnosis not present

## 2023-11-19 DIAGNOSIS — K754 Autoimmune hepatitis: Secondary | ICD-10-CM | POA: Diagnosis not present

## 2023-11-19 DIAGNOSIS — Z9989 Dependence on other enabling machines and devices: Secondary | ICD-10-CM | POA: Diagnosis not present

## 2023-11-19 DIAGNOSIS — N329 Bladder disorder, unspecified: Secondary | ICD-10-CM | POA: Diagnosis not present

## 2023-11-19 DIAGNOSIS — I700 Atherosclerosis of aorta: Secondary | ICD-10-CM | POA: Diagnosis not present

## 2023-11-19 DIAGNOSIS — K5900 Constipation, unspecified: Secondary | ICD-10-CM | POA: Diagnosis not present

## 2023-11-25 DIAGNOSIS — M25552 Pain in left hip: Secondary | ICD-10-CM | POA: Diagnosis not present

## 2023-11-25 DIAGNOSIS — M542 Cervicalgia: Secondary | ICD-10-CM | POA: Diagnosis not present

## 2023-11-27 DIAGNOSIS — M25552 Pain in left hip: Secondary | ICD-10-CM | POA: Diagnosis not present

## 2023-11-27 DIAGNOSIS — M542 Cervicalgia: Secondary | ICD-10-CM | POA: Diagnosis not present

## 2023-12-04 DIAGNOSIS — M25552 Pain in left hip: Secondary | ICD-10-CM | POA: Diagnosis not present

## 2023-12-04 DIAGNOSIS — M542 Cervicalgia: Secondary | ICD-10-CM | POA: Diagnosis not present

## 2023-12-09 DIAGNOSIS — M542 Cervicalgia: Secondary | ICD-10-CM | POA: Diagnosis not present

## 2023-12-09 DIAGNOSIS — M25552 Pain in left hip: Secondary | ICD-10-CM | POA: Diagnosis not present

## 2023-12-16 DIAGNOSIS — M542 Cervicalgia: Secondary | ICD-10-CM | POA: Diagnosis not present

## 2023-12-16 DIAGNOSIS — M25552 Pain in left hip: Secondary | ICD-10-CM | POA: Diagnosis not present

## 2023-12-22 DIAGNOSIS — G959 Disease of spinal cord, unspecified: Secondary | ICD-10-CM | POA: Diagnosis not present

## 2023-12-22 DIAGNOSIS — M25552 Pain in left hip: Secondary | ICD-10-CM | POA: Diagnosis not present

## 2023-12-22 DIAGNOSIS — M542 Cervicalgia: Secondary | ICD-10-CM | POA: Diagnosis not present

## 2023-12-27 DIAGNOSIS — M4802 Spinal stenosis, cervical region: Secondary | ICD-10-CM | POA: Diagnosis not present

## 2023-12-27 DIAGNOSIS — M4803 Spinal stenosis, cervicothoracic region: Secondary | ICD-10-CM | POA: Diagnosis not present

## 2023-12-27 DIAGNOSIS — M4322 Fusion of spine, cervical region: Secondary | ICD-10-CM | POA: Diagnosis not present

## 2023-12-27 DIAGNOSIS — M47812 Spondylosis without myelopathy or radiculopathy, cervical region: Secondary | ICD-10-CM | POA: Diagnosis not present

## 2023-12-27 DIAGNOSIS — G959 Disease of spinal cord, unspecified: Secondary | ICD-10-CM | POA: Diagnosis not present

## 2023-12-30 DIAGNOSIS — M542 Cervicalgia: Secondary | ICD-10-CM | POA: Diagnosis not present

## 2023-12-30 DIAGNOSIS — M25552 Pain in left hip: Secondary | ICD-10-CM | POA: Diagnosis not present

## 2024-01-01 DIAGNOSIS — K080 Exfoliation of teeth due to systemic causes: Secondary | ICD-10-CM | POA: Diagnosis not present

## 2024-01-02 DIAGNOSIS — M9903 Segmental and somatic dysfunction of lumbar region: Secondary | ICD-10-CM | POA: Diagnosis not present

## 2024-01-02 DIAGNOSIS — M9901 Segmental and somatic dysfunction of cervical region: Secondary | ICD-10-CM | POA: Diagnosis not present

## 2024-01-02 DIAGNOSIS — M9902 Segmental and somatic dysfunction of thoracic region: Secondary | ICD-10-CM | POA: Diagnosis not present

## 2024-01-02 DIAGNOSIS — M9906 Segmental and somatic dysfunction of lower extremity: Secondary | ICD-10-CM | POA: Diagnosis not present

## 2024-01-05 DIAGNOSIS — M9906 Segmental and somatic dysfunction of lower extremity: Secondary | ICD-10-CM | POA: Diagnosis not present

## 2024-01-05 DIAGNOSIS — M9901 Segmental and somatic dysfunction of cervical region: Secondary | ICD-10-CM | POA: Diagnosis not present

## 2024-01-05 DIAGNOSIS — M9903 Segmental and somatic dysfunction of lumbar region: Secondary | ICD-10-CM | POA: Diagnosis not present

## 2024-01-05 DIAGNOSIS — M9902 Segmental and somatic dysfunction of thoracic region: Secondary | ICD-10-CM | POA: Diagnosis not present

## 2024-01-06 DIAGNOSIS — M542 Cervicalgia: Secondary | ICD-10-CM | POA: Diagnosis not present

## 2024-01-06 DIAGNOSIS — M25552 Pain in left hip: Secondary | ICD-10-CM | POA: Diagnosis not present

## 2024-01-07 DIAGNOSIS — M9902 Segmental and somatic dysfunction of thoracic region: Secondary | ICD-10-CM | POA: Diagnosis not present

## 2024-01-07 DIAGNOSIS — M9901 Segmental and somatic dysfunction of cervical region: Secondary | ICD-10-CM | POA: Diagnosis not present

## 2024-01-07 DIAGNOSIS — M9903 Segmental and somatic dysfunction of lumbar region: Secondary | ICD-10-CM | POA: Diagnosis not present

## 2024-01-07 DIAGNOSIS — M9906 Segmental and somatic dysfunction of lower extremity: Secondary | ICD-10-CM | POA: Diagnosis not present

## 2024-01-09 DIAGNOSIS — M9902 Segmental and somatic dysfunction of thoracic region: Secondary | ICD-10-CM | POA: Diagnosis not present

## 2024-01-09 DIAGNOSIS — M9906 Segmental and somatic dysfunction of lower extremity: Secondary | ICD-10-CM | POA: Diagnosis not present

## 2024-01-09 DIAGNOSIS — M9901 Segmental and somatic dysfunction of cervical region: Secondary | ICD-10-CM | POA: Diagnosis not present

## 2024-01-09 DIAGNOSIS — M9903 Segmental and somatic dysfunction of lumbar region: Secondary | ICD-10-CM | POA: Diagnosis not present

## 2024-01-13 DIAGNOSIS — M9906 Segmental and somatic dysfunction of lower extremity: Secondary | ICD-10-CM | POA: Diagnosis not present

## 2024-01-13 DIAGNOSIS — M9901 Segmental and somatic dysfunction of cervical region: Secondary | ICD-10-CM | POA: Diagnosis not present

## 2024-01-13 DIAGNOSIS — M9903 Segmental and somatic dysfunction of lumbar region: Secondary | ICD-10-CM | POA: Diagnosis not present

## 2024-01-13 DIAGNOSIS — M9902 Segmental and somatic dysfunction of thoracic region: Secondary | ICD-10-CM | POA: Diagnosis not present

## 2024-01-14 DIAGNOSIS — M9906 Segmental and somatic dysfunction of lower extremity: Secondary | ICD-10-CM | POA: Diagnosis not present

## 2024-01-14 DIAGNOSIS — M9903 Segmental and somatic dysfunction of lumbar region: Secondary | ICD-10-CM | POA: Diagnosis not present

## 2024-01-14 DIAGNOSIS — M9902 Segmental and somatic dysfunction of thoracic region: Secondary | ICD-10-CM | POA: Diagnosis not present

## 2024-01-14 DIAGNOSIS — M9901 Segmental and somatic dysfunction of cervical region: Secondary | ICD-10-CM | POA: Diagnosis not present

## 2024-01-16 DIAGNOSIS — M9902 Segmental and somatic dysfunction of thoracic region: Secondary | ICD-10-CM | POA: Diagnosis not present

## 2024-01-16 DIAGNOSIS — M9906 Segmental and somatic dysfunction of lower extremity: Secondary | ICD-10-CM | POA: Diagnosis not present

## 2024-01-16 DIAGNOSIS — M9901 Segmental and somatic dysfunction of cervical region: Secondary | ICD-10-CM | POA: Diagnosis not present

## 2024-01-16 DIAGNOSIS — M9903 Segmental and somatic dysfunction of lumbar region: Secondary | ICD-10-CM | POA: Diagnosis not present

## 2024-01-19 DIAGNOSIS — M9901 Segmental and somatic dysfunction of cervical region: Secondary | ICD-10-CM | POA: Diagnosis not present

## 2024-01-19 DIAGNOSIS — M9906 Segmental and somatic dysfunction of lower extremity: Secondary | ICD-10-CM | POA: Diagnosis not present

## 2024-01-19 DIAGNOSIS — M9902 Segmental and somatic dysfunction of thoracic region: Secondary | ICD-10-CM | POA: Diagnosis not present

## 2024-01-19 DIAGNOSIS — M9903 Segmental and somatic dysfunction of lumbar region: Secondary | ICD-10-CM | POA: Diagnosis not present

## 2024-01-21 DIAGNOSIS — M9901 Segmental and somatic dysfunction of cervical region: Secondary | ICD-10-CM | POA: Diagnosis not present

## 2024-01-21 DIAGNOSIS — M9903 Segmental and somatic dysfunction of lumbar region: Secondary | ICD-10-CM | POA: Diagnosis not present

## 2024-01-21 DIAGNOSIS — M9902 Segmental and somatic dysfunction of thoracic region: Secondary | ICD-10-CM | POA: Diagnosis not present

## 2024-01-21 DIAGNOSIS — M9906 Segmental and somatic dysfunction of lower extremity: Secondary | ICD-10-CM | POA: Diagnosis not present

## 2024-01-26 DIAGNOSIS — M9902 Segmental and somatic dysfunction of thoracic region: Secondary | ICD-10-CM | POA: Diagnosis not present

## 2024-01-26 DIAGNOSIS — M9901 Segmental and somatic dysfunction of cervical region: Secondary | ICD-10-CM | POA: Diagnosis not present

## 2024-01-26 DIAGNOSIS — M9903 Segmental and somatic dysfunction of lumbar region: Secondary | ICD-10-CM | POA: Diagnosis not present

## 2024-01-26 DIAGNOSIS — M9906 Segmental and somatic dysfunction of lower extremity: Secondary | ICD-10-CM | POA: Diagnosis not present

## 2024-01-27 DIAGNOSIS — M4800 Spinal stenosis, site unspecified: Secondary | ICD-10-CM | POA: Diagnosis not present

## 2024-01-27 DIAGNOSIS — G959 Disease of spinal cord, unspecified: Secondary | ICD-10-CM | POA: Diagnosis not present

## 2024-01-27 DIAGNOSIS — K754 Autoimmune hepatitis: Secondary | ICD-10-CM | POA: Diagnosis not present

## 2024-01-27 DIAGNOSIS — G629 Polyneuropathy, unspecified: Secondary | ICD-10-CM | POA: Diagnosis not present

## 2024-01-28 DIAGNOSIS — M9906 Segmental and somatic dysfunction of lower extremity: Secondary | ICD-10-CM | POA: Diagnosis not present

## 2024-01-28 DIAGNOSIS — M9901 Segmental and somatic dysfunction of cervical region: Secondary | ICD-10-CM | POA: Diagnosis not present

## 2024-01-28 DIAGNOSIS — M9902 Segmental and somatic dysfunction of thoracic region: Secondary | ICD-10-CM | POA: Diagnosis not present

## 2024-01-28 DIAGNOSIS — M9903 Segmental and somatic dysfunction of lumbar region: Secondary | ICD-10-CM | POA: Diagnosis not present

## 2024-01-30 DIAGNOSIS — M9901 Segmental and somatic dysfunction of cervical region: Secondary | ICD-10-CM | POA: Diagnosis not present

## 2024-01-30 DIAGNOSIS — M9902 Segmental and somatic dysfunction of thoracic region: Secondary | ICD-10-CM | POA: Diagnosis not present

## 2024-01-30 DIAGNOSIS — M9906 Segmental and somatic dysfunction of lower extremity: Secondary | ICD-10-CM | POA: Diagnosis not present

## 2024-01-30 DIAGNOSIS — M9903 Segmental and somatic dysfunction of lumbar region: Secondary | ICD-10-CM | POA: Diagnosis not present

## 2024-02-06 DIAGNOSIS — M9906 Segmental and somatic dysfunction of lower extremity: Secondary | ICD-10-CM | POA: Diagnosis not present

## 2024-02-06 DIAGNOSIS — M9902 Segmental and somatic dysfunction of thoracic region: Secondary | ICD-10-CM | POA: Diagnosis not present

## 2024-02-06 DIAGNOSIS — M9903 Segmental and somatic dysfunction of lumbar region: Secondary | ICD-10-CM | POA: Diagnosis not present

## 2024-02-06 DIAGNOSIS — M9901 Segmental and somatic dysfunction of cervical region: Secondary | ICD-10-CM | POA: Diagnosis not present

## 2024-02-09 DIAGNOSIS — M9903 Segmental and somatic dysfunction of lumbar region: Secondary | ICD-10-CM | POA: Diagnosis not present

## 2024-02-09 DIAGNOSIS — M9901 Segmental and somatic dysfunction of cervical region: Secondary | ICD-10-CM | POA: Diagnosis not present

## 2024-02-09 DIAGNOSIS — M9902 Segmental and somatic dysfunction of thoracic region: Secondary | ICD-10-CM | POA: Diagnosis not present

## 2024-02-09 DIAGNOSIS — M9906 Segmental and somatic dysfunction of lower extremity: Secondary | ICD-10-CM | POA: Diagnosis not present

## 2024-02-16 DIAGNOSIS — M9901 Segmental and somatic dysfunction of cervical region: Secondary | ICD-10-CM | POA: Diagnosis not present

## 2024-02-16 DIAGNOSIS — M9902 Segmental and somatic dysfunction of thoracic region: Secondary | ICD-10-CM | POA: Diagnosis not present

## 2024-02-16 DIAGNOSIS — M9906 Segmental and somatic dysfunction of lower extremity: Secondary | ICD-10-CM | POA: Diagnosis not present

## 2024-02-16 DIAGNOSIS — M9903 Segmental and somatic dysfunction of lumbar region: Secondary | ICD-10-CM | POA: Diagnosis not present

## 2024-02-26 DIAGNOSIS — H52223 Regular astigmatism, bilateral: Secondary | ICD-10-CM | POA: Diagnosis not present

## 2024-03-11 DIAGNOSIS — K760 Fatty (change of) liver, not elsewhere classified: Secondary | ICD-10-CM | POA: Diagnosis not present

## 2024-03-11 DIAGNOSIS — K754 Autoimmune hepatitis: Secondary | ICD-10-CM | POA: Diagnosis not present

## 2024-03-11 DIAGNOSIS — D849 Immunodeficiency, unspecified: Secondary | ICD-10-CM | POA: Diagnosis not present

## 2024-03-11 DIAGNOSIS — K7401 Hepatic fibrosis, early fibrosis: Secondary | ICD-10-CM | POA: Diagnosis not present

## 2024-04-20 ENCOUNTER — Encounter: Payer: Self-pay | Admitting: Sports Medicine

## 2024-04-20 ENCOUNTER — Ambulatory Visit (HOSPITAL_BASED_OUTPATIENT_CLINIC_OR_DEPARTMENT_OTHER)
Admission: RE | Admit: 2024-04-20 | Discharge: 2024-04-20 | Disposition: A | Discharge status: Home / Self Care | Source: Ambulatory Visit | Attending: Sports Medicine | Admitting: Sports Medicine

## 2024-04-20 ENCOUNTER — Ambulatory Visit (INDEPENDENT_AMBULATORY_CARE_PROVIDER_SITE_OTHER): Admitting: Sports Medicine

## 2024-04-20 VITALS — BP 130/64 | Ht 75.0 in | Wt 240.0 lb

## 2024-04-20 DIAGNOSIS — M25551 Pain in right hip: Secondary | ICD-10-CM | POA: Diagnosis not present

## 2024-04-20 NOTE — Progress Notes (Signed)
   Subjective:    Patient ID: Alex West, male    DOB: [birth year], 68 y.o.   MRN: [id number]  HPI chief complaint: Right hip and hamstring pain  Patient is a very pleasant 68 year old male that presents today with some chronic right hip and hamstring pain.  He is status post cervical spine fusion from C3-C6 done in August 2023.  Shortly after surgery he began to experience neuropathy diffusely throughout his body.  This led to tremendous weakness which resulted in almost a year of physical therapy.  His main complaint today is pain around the right hip.  He has a history of left total hip arthroplasty done in January 2024.  His pain is primarily along the lateral and posterior hip but he is concerned about arthritis.  The pain in his right hamstring is more of a cramping type feeling.  No recent trauma although he does remember a fall onto the left hip several months ago.  He feels like his strength is slowly returning.  He enjoys being active in the gym.  Past medical history reviewed Medications reviewed Allergies reviewed   Review of Systems As above    Objective:   Physical Exam  Well-developed, well-nourished.  No acute distress.  Right hip: Smooth painless hip range of motion with a negative logroll.  Slight tenderness to palpation along the lateral and posterior lateral hip.  Positive Trendelenburg bilaterally.  Patient has difficulty going from a seated to standing position.  He must use the arms of the chair to help him rise.  X-rays of his right hip including AP pelvis and lateral right hip shows a moderate amount of hip OA.  Left hip prosthesis shows no obvious abnormality.    Assessment & Plan:   Right hip osteoarthritis with pelvic stabilizer weakness Chronic neuropathy status post C3-C6 fusion, 2023 Status post left total hip arthroplasty done by Dr. Carry Clapper in 2024  Although there is some x-ray evidence of arthritis, I recommend a home exercise program to work on  strengthening his pelvic stabilizers, hip abductors, and quads (sit to stand exercises for 1 minute each day).  Follow-up with me again in 6 weeks for reevaluation.  This note was dictated using Dragon naturally speaking software and may contain errors in syntax, spelling, or content which have not been identified prior to signing this note.

## 2024-05-12 DIAGNOSIS — K080 Exfoliation of teeth due to systemic causes: Secondary | ICD-10-CM | POA: Diagnosis not present

## 2024-05-25 ENCOUNTER — Ambulatory Visit: Admitting: Sports Medicine

## 2024-05-25 ENCOUNTER — Encounter: Payer: Self-pay | Admitting: Sports Medicine

## 2024-05-25 VITALS — BP 129/83 | Ht 75.0 in | Wt 240.0 lb

## 2024-05-25 DIAGNOSIS — S76311D Strain of muscle, fascia and tendon of the posterior muscle group at thigh level, right thigh, subsequent encounter: Secondary | ICD-10-CM | POA: Diagnosis not present

## 2024-05-25 NOTE — Progress Notes (Signed)
   Subjective:    Patient ID: Alex West, male    DOB: May 12, 1956, 68 y.o.   MRN: [id number]  HPI  Alex West presents today for follow-up on right hip and hamstring pain.  Hip is feeling better after performing some home exercises.  His main pain continues to be in the hamstring.  He localizes it to the mid hamstring region.  Most noticeable with walking.  He has felt some relief with a brace with a device that treats his sciatica.  Also noticed some relief with some topical natural ointments.  Denies radiating pain past the knee.   Review of Systems As above    Objective:   Physical Exam  Right hip smooth painless hip range of motion with a negative logroll.  There is some tenderness to palpation along the midportion of the right hamstring.  Very mild amount of atrophy in this area as well.  Good hamstring strength with resisted knee flexion at 30 and 90 degrees.  Neurovascularly intact distally.      Assessment & Plan:   Chronic right hamstring tendinopathy Peripheral neuropathy status post C3-C6 fusion, 2023   Alex West believes that his hamstring pain is neuropathic in nature and I agree.  I would like for him to do some formal physical therapy at [facility] and follow-up with me again in 6 weeks for reevaluation.  He understands that we may not be able to completely alleviate his pain but hopefully we can improve his symptoms and combat further weakness.  I also recommend that he try a thigh compression sleeve with activity.  This note was dictated using Dragon naturally speaking software and may contain errors in syntax, spelling, or content which have not been identified prior to signing this note.

## 2024-05-31 DIAGNOSIS — E7800 Pure hypercholesterolemia, unspecified: Secondary | ICD-10-CM | POA: Diagnosis not present

## 2024-05-31 DIAGNOSIS — K754 Autoimmune hepatitis: Secondary | ICD-10-CM | POA: Diagnosis not present

## 2024-05-31 DIAGNOSIS — N529 Male erectile dysfunction, unspecified: Secondary | ICD-10-CM | POA: Diagnosis not present

## 2024-05-31 DIAGNOSIS — Z862 Personal history of diseases of the blood and blood-forming organs and certain disorders involving the immune mechanism: Secondary | ICD-10-CM | POA: Diagnosis not present

## 2024-05-31 DIAGNOSIS — E039 Hypothyroidism, unspecified: Secondary | ICD-10-CM | POA: Diagnosis not present

## 2024-05-31 DIAGNOSIS — K5900 Constipation, unspecified: Secondary | ICD-10-CM | POA: Diagnosis not present

## 2024-05-31 DIAGNOSIS — Z0000 Encounter for general adult medical examination without abnormal findings: Secondary | ICD-10-CM | POA: Diagnosis not present

## 2024-06-01 DIAGNOSIS — G959 Disease of spinal cord, unspecified: Secondary | ICD-10-CM | POA: Diagnosis not present

## 2024-06-07 DIAGNOSIS — R531 Weakness: Secondary | ICD-10-CM | POA: Diagnosis not present

## 2024-06-07 DIAGNOSIS — M25551 Pain in right hip: Secondary | ICD-10-CM | POA: Diagnosis not present

## 2024-06-07 DIAGNOSIS — M67813 Other specified disorders of tendon, right shoulder: Secondary | ICD-10-CM | POA: Diagnosis not present

## 2024-06-07 DIAGNOSIS — M25651 Stiffness of right hip, not elsewhere classified: Secondary | ICD-10-CM | POA: Diagnosis not present

## 2024-06-11 DIAGNOSIS — M25651 Stiffness of right hip, not elsewhere classified: Secondary | ICD-10-CM | POA: Diagnosis not present

## 2024-06-11 DIAGNOSIS — M67813 Other specified disorders of tendon, right shoulder: Secondary | ICD-10-CM | POA: Diagnosis not present

## 2024-06-11 DIAGNOSIS — R531 Weakness: Secondary | ICD-10-CM | POA: Diagnosis not present

## 2024-06-11 DIAGNOSIS — M25551 Pain in right hip: Secondary | ICD-10-CM | POA: Diagnosis not present

## 2024-06-15 DIAGNOSIS — K5904 Chronic idiopathic constipation: Secondary | ICD-10-CM | POA: Diagnosis not present

## 2024-06-17 DIAGNOSIS — M67813 Other specified disorders of tendon, right shoulder: Secondary | ICD-10-CM | POA: Diagnosis not present

## 2024-06-17 DIAGNOSIS — M25551 Pain in right hip: Secondary | ICD-10-CM | POA: Diagnosis not present

## 2024-06-17 DIAGNOSIS — R531 Weakness: Secondary | ICD-10-CM | POA: Diagnosis not present

## 2024-06-17 DIAGNOSIS — M25651 Stiffness of right hip, not elsewhere classified: Secondary | ICD-10-CM | POA: Diagnosis not present

## 2024-06-23 DIAGNOSIS — M67813 Other specified disorders of tendon, right shoulder: Secondary | ICD-10-CM | POA: Diagnosis not present

## 2024-06-23 DIAGNOSIS — R531 Weakness: Secondary | ICD-10-CM | POA: Diagnosis not present

## 2024-06-23 DIAGNOSIS — M25551 Pain in right hip: Secondary | ICD-10-CM | POA: Diagnosis not present

## 2024-06-23 DIAGNOSIS — M25651 Stiffness of right hip, not elsewhere classified: Secondary | ICD-10-CM | POA: Diagnosis not present

## 2024-06-30 ENCOUNTER — Ambulatory Visit (INDEPENDENT_AMBULATORY_CARE_PROVIDER_SITE_OTHER): Admitting: Sports Medicine

## 2024-06-30 ENCOUNTER — Encounter: Payer: Self-pay | Admitting: Sports Medicine

## 2024-06-30 VITALS — BP 118/70 | Ht 75.0 in | Wt 240.0 lb

## 2024-06-30 DIAGNOSIS — M25551 Pain in right hip: Secondary | ICD-10-CM

## 2024-06-30 DIAGNOSIS — S76311D Strain of muscle, fascia and tendon of the posterior muscle group at thigh level, right thigh, subsequent encounter: Secondary | ICD-10-CM

## 2024-06-30 DIAGNOSIS — G629 Polyneuropathy, unspecified: Secondary | ICD-10-CM

## 2024-06-30 NOTE — Progress Notes (Signed)
 Patient ID: Alex West, male   DOB: 11-15-1956, 68 y.o.   MRN: [id number]  Alex West presents today for follow-up on chronic right hamstring pain.  He has started physical therapy and has noticed some mild improvement.  However, he still has significant discomfort.  He also has left hip pain.  He is status post left total hip arthroplasty done by Dr. Liam in January 2024.  In May 2024, he fell directly on the left hip while vacationing.  He was seen by Dr. Minette office afterwards where x-rays were unremarkable.  However, since then, he has had persistent posterior and lateral hip pain that is affecting his ability to walk.  A CT scan of his pelvis done in February 2024 showed evidence of his prior left hip arthroplasty with normal alignment.  He was also noted to have right hip osteoarthritis and degenerative changes at L4-L5.  Physical exam was not repeated today.  We simply talked about next steps in workup and treatment.  Since Dr. Liam has retired, I recommended a referral to [facility] to see if any further workup needs to be done on his left hip.  I have also recommended consultation with Dr. Redell Reid to discuss his chronic neuropathic pain that he has had since cervical spine surgery.  He was recently seen by neurology but is not interested in any neuropathic pain medications.  Hopefully Dr. Reid will be able to offer him alternative treatments.  I do think he should continue with physical therapy for the time being and can wean to a home exercise program per the therapist discretion.  He understands the importance of continuing to stay active despite his chronic pain.  Follow-up with me as needed.  This note was dictated using Dragon naturally speaking software and may contain errors in syntax, spelling, or content which have not been identified prior to signing this note.

## 2024-07-08 DIAGNOSIS — M67813 Other specified disorders of tendon, right shoulder: Secondary | ICD-10-CM | POA: Diagnosis not present

## 2024-07-08 DIAGNOSIS — M25551 Pain in right hip: Secondary | ICD-10-CM | POA: Diagnosis not present

## 2024-07-08 DIAGNOSIS — R531 Weakness: Secondary | ICD-10-CM | POA: Diagnosis not present

## 2024-07-08 DIAGNOSIS — M25651 Stiffness of right hip, not elsewhere classified: Secondary | ICD-10-CM | POA: Diagnosis not present

## 2024-07-09 ENCOUNTER — Ambulatory Visit (INDEPENDENT_AMBULATORY_CARE_PROVIDER_SITE_OTHER): Admitting: Pain Medicine

## 2024-07-09 VITALS — BP 108/60 | Ht 75.0 in | Wt 240.0 lb

## 2024-07-09 DIAGNOSIS — F32A Depression, unspecified: Secondary | ICD-10-CM | POA: Diagnosis not present

## 2024-07-09 DIAGNOSIS — G959 Disease of spinal cord, unspecified: Secondary | ICD-10-CM

## 2024-07-09 DIAGNOSIS — M5416 Radiculopathy, lumbar region: Secondary | ICD-10-CM

## 2024-07-09 NOTE — Progress Notes (Signed)
 DATE OF VISIT: 07/09/2024        Alex West DOB: 1956-02-21 MRN: [id number]  CC:  neuropathic pain  History- Alex West is a 68 y.o. male in clinic for evaluation and treatment of neuropathic pain.  Patient had an ACDF for spinal stenosis and some cervical radicular symptoms back in August 2023.  After surgery, he woke up with significant BLE pain and weakness to include gait disturbances requiring a walker initially.  He reports a 2 year period of PT that have improved strength, though he still requires a cane occasionally.    He notes BUE and top of foot pain bilaterally that is burning in nature with allodynia from cold air.  He wears long sleeve shirts to sleep to help with his pain.  He also notes L hip pain resultant from a surgery in 2024 and a fall several months later in May of 2024.  Additionally, he notes RLE posterior thigh pain and subjective weakness that was also concordant with the Aug 2023 surgery.  Patient also states his quality of life has dramatically changed since the surgery.  He previously was very active and a gym rat, but now has lost majority of his strength and is in pain.  He has never talked with mental health professional  Pain complaint: Location: R posterior thigh Duration: August 2023 Quality: aching, altered sensation Severity: 8/10 Timing: constant    Past Medical History Past Medical History:  Diagnosis Date   Arthritis    BPH (benign prostatic hyperplasia)    Colon polyp    ED (erectile dysfunction)    Elevated liver enzymes    Gallstones    Hepatitis    History of anemia    Hypercholesterolemia    Hypothyroidism    Mild tricuspid regurgitation    Mitral valve regurgitation    Thyroid  disease    Vertigo     Past Surgical History Past Surgical History:  Procedure Laterality Date   ANTERIOR CERVICAL DECOMP/DISCECTOMY FUSION N/A 07/23/2022   Procedure: Anterior Cervical Discectomy and Fusion Cervical  Three-Four/Four-Five/Five-Six;  Surgeon: Debby Dorn MATSU, MD;  Location: [facility] OR;  Service: Neurosurgery;  Laterality: N/A;  3C   FLEXIBLE SIGMOIDOSCOPY     TOTAL HIP ARTHROPLASTY Left 01/06/2023   Procedure: LEFT TOTAL HIP ARTHROPLASTY ANTERIOR APPROACH;  Surgeon: Liam Lerner, MD;  Location: [facility] ORS;  Service: Orthopedics;  Laterality: Left;    Medications Current Outpatient Medications  Medication Sig Dispense Refill   aspirin  EC 81 MG tablet Take 1 tablet (81 mg total) by mouth 2 (two) times daily. 60 tablet 0   azaTHIOprine  (IMURAN ) 50 MG tablet Take 75 mg by mouth daily.     levothyroxine  (SYNTHROID ) 200 MCG tablet Take 200 mcg by mouth daily before breakfast.     Multiple Vitamins-Minerals (CENTRUM ADULT PO) Take 1 tablet by mouth daily.     No current facility-administered medications for this visit.    Allergies is allergic to tadalafil.  Family History - reviewed per EMR and intake form  Social History   reports current alcohol use.  reports that he has quit smoking. He has never used smokeless tobacco.  reports no history of drug use.  EXAM: Vitals: BP 108/60   Ht 6' 3 (1.905 m)   Wt 240 lb (108.9 kg)   BMI 30.00 kg/m  General: AOx3, NAD, pleasant SKIN: no rashes or lesions, skin clean, dry, intact MSK:  Thighs appear to be symmetric in size  SLR negative for radicular symptoms,  but positive for R hamstring pain.  Negative logroll  NEURO: Allodynia over BUE  IMAGING: MRI 01/2023: EXAM: MRI CERVICAL, THORACIC AND LUMBAR SPINE WITHOUT CONTRAST   TECHNIQUE: Multiplanar and multiecho pulse sequences of the cervical spine, to include the craniocervical junction and cervicothoracic junction, and thoracic and lumbar spine, were obtained without intravenous contrast.   COMPARISON:  09/18/2022   FINDINGS: MRI CERVICAL SPINE FINDINGS   Alignment: Physiologic.   Vertebrae: C3-6 ACDF.  No acute abnormality.   Cord: Myelomalacia at the C3-4 level,  unchanged.   Posterior Fossa, vertebral arteries, paraspinal tissues: Negative.   Disc levels:   C1-2: Unremarkable.   C2-3: Mild facet hypertrophy with small disc bulge and uncinate spurring. There is no spinal canal stenosis. Bilateral neural foraminal stenosis.   C3-4: ACDF. Unchanged small disc bulge. Unchanged severe spinal canal stenosis. No neural foraminal stenosis.   C4-5: ACDF. Mild facet hypertrophy. There is no spinal canal stenosis. No neural foraminal stenosis.   C5-6: ACDF with small disc bulge and uncinate spurring. Unchanged mild spinal canal stenosis. Unchanged moderate bilateral neural foraminal stenosis.   C6-7: Small disc bulge. There is no spinal canal stenosis. No neural foraminal stenosis.   C7-T1: Normal disc space and facet joints. There is no spinal canal stenosis. No neural foraminal stenosis.   MRI THORACIC SPINE FINDINGS   Alignment:  Physiologic.   Vertebrae: No fracture, evidence of discitis, or bone lesion. Modic type 2 endplate signal changes at L4-5, unchanged.   Cord:  Normal signal and morphology.   Paraspinal and other soft tissues: Negative.   Disc levels:   There is mild multilevel degenerative disc disease, greatest at T9-10 where there is mild spinal canal stenosis. No stenosis at any other level.   MRI LUMBAR SPINE FINDINGS   Segmentation:  Standard.   Alignment:  Physiologic.   Vertebrae:  No fracture, evidence of discitis, or bone lesion.   Conus medullaris and cauda equina: Conus extends to the T12 level. Conus and cauda equina appear normal.   Paraspinal and other soft tissues: Negative   Disc levels:   L1-L2: Normal disc space and facet joints. No spinal canal stenosis. No neural foraminal stenosis.   L2-L3: Small disc bulge, unchanged. Unchanged mild spinal canal stenosis. No neural foraminal stenosis.   L3-L4: Unchanged small disc bulge. Mild spinal canal stenosis. No neural foraminal stenosis.    L4-L5: Unchanged intermediate sized disc bulge, right asymmetric. No spinal canal stenosis. Unchanged mild right neural foraminal stenosis.   L5-S1: Normal disc space and facet joints. No spinal canal stenosis. No neural foraminal stenosis.   Visualized sacrum: Normal.   IMPRESSION: 1. C3-6 ACDF with unchanged severe spinal canal stenosis at C3-4 and focal myelomalacia. 2. Unchanged moderate bilateral C5-6 neural foraminal stenosis. 3. Mild multilevel thoracic degenerative disc disease with mild spinal canal stenosis at T9-10. 4. Unchanged mild spinal canal stenosis at L2-3, L3-4 and L4-5. 5. Unchanged mild right L4-5 neural foraminal stenosis.  EMG 11/2022: Conclusion: This is an abnormal study.  There is electrodiagnostic evidence of chronic mild right C5-6-7 cervical radiculopathy, and also chronic right L4-5 S1 lumbar radiculopathy.  There is no evidence of large fiber peripheral neuropathy, or upper extremity focal neuropathy.  ASSESSMENT:   ICD-10-CM   1. Cervical myelopathy (HCC)  G95.9 MR CERVICAL SPINE WO CONTRAST    Ambulatory referral to Neurosurgery    2. Lumbar radiculopathy  M54.16 MR Lumbar Spine Wo Contrast    3. Depression, unspecified depression type  F32.A Ambulatory  referral to Behavioral Health       PLAN: Patient with residual severe cervical stenosis on imaging from 2024, with ongoing BUE allodynia, likely from myelomalacia.  Alex order new Cspine MRI to evaluate current state given it has been approximately 1.5 years since previous imaging.  Alex issue NSG referral given symptoms, prior imaging results after decompression. R posterior thigh pain - could be secondary to myofascial component, but given R L4 stenosis and EMG evidence of chronic R radiculopathy, favor radicular source.  Unclear association with cervical spine surgery.  Alex order new MRI to evaluate symptoms.  Patient is unsure if he would like to attempt an ESI, but can readdress after  imaging results.   Recommended starting Cymbalta  30mg  daily or gabapentin  titration.  He is hesitant about neuropathic medications, but on further discussion, he is willing to try one to improve pain.  He has some concerns with his liver disease about medications in general, so he Alex touch base with his hepatologist regarding any preference on medication.  I would favor Cymbalta  as a once daily medication to reduce sedation, though there could be some association with liver transaminase elevation.  Alex hold until patient has discussed further.   Patient Alex attempt OTC capsaicin treatment for BUE neuropathic pain.  Asked patient to apply TID for several weeks and wash hands after application. Referral to behavioral health placed.  Patient has had major life changes with QoL changes as well.  Formal depression screening deferred today, but he would likely benefit from evaluation given association with worsening chronic pain.  Patient agrees to this.  The patient Alex return to see me in 2 months, Alex call sooner with any questions/concerns.   Patient expressed understanding & agreement with above.

## 2024-07-12 ENCOUNTER — Encounter: Payer: Self-pay | Admitting: Pain Medicine

## 2024-07-20 DIAGNOSIS — M67813 Other specified disorders of tendon, right shoulder: Secondary | ICD-10-CM | POA: Diagnosis not present

## 2024-07-20 DIAGNOSIS — M25551 Pain in right hip: Secondary | ICD-10-CM | POA: Diagnosis not present

## 2024-07-20 DIAGNOSIS — R531 Weakness: Secondary | ICD-10-CM | POA: Diagnosis not present

## 2024-07-20 DIAGNOSIS — M25651 Stiffness of right hip, not elsewhere classified: Secondary | ICD-10-CM | POA: Diagnosis not present

## 2024-07-22 ENCOUNTER — Ambulatory Visit (INDEPENDENT_AMBULATORY_CARE_PROVIDER_SITE_OTHER): Admitting: Orthopaedic Surgery

## 2024-07-22 ENCOUNTER — Other Ambulatory Visit (INDEPENDENT_AMBULATORY_CARE_PROVIDER_SITE_OTHER): Payer: Self-pay

## 2024-07-22 DIAGNOSIS — Z96642 Presence of left artificial hip joint: Secondary | ICD-10-CM | POA: Diagnosis not present

## 2024-07-22 DIAGNOSIS — M25552 Pain in left hip: Secondary | ICD-10-CM | POA: Diagnosis not present

## 2024-07-22 NOTE — Progress Notes (Signed)
 The patient is a very pleasant 68 year old gentleman sent to me from Dr. Arvell to evaluate and treat his hips.  He actually has a left total hip arthroplasty that was done in January 2024 by Dr. Liam here in town who is now retiring.  He does ambulate with a cane and he said about 6 months or so after surgery he fell and he felt like he may have injured that hip.  He also has quite severe cervical stenosis and has had cervical spine surgery.  He does ambulate with a cane because of that and not so much his hips.  He denies any significant discomfort today with his right hip.  His left hip is felt more weak than it is painful.  When he stands his leg lengths appear equal and I did have him lay supine and his leg lengths are equal.  He does easily tolerate removing his left hip through full range of motion.  There is no blocks or rotation.  He has good hip flexion with no significant pain over the iliopsoas tendon area.  His right hip also moves smoothly and fluidly.  An AP pelvis and lateral of the left hip shows a well-seated and well-fixed total hip arthroplasty with no complicating features.  The leg lengths appear equal and the offset appears normal.  His right hip space is still well-maintained.  There is only slight narrowing of the right side.  From my standpoint there is nothing else that I would offer other than strengthening his hips.  Some of his issues are certainly related to his worsening cervical issues.  If he does develop any further problems with his hips he will let me know and he should reach out to us  at any time for orthopedic issues such as this.  Will continue to follow him closely if things worsen with his hips he knows to reach out.

## 2024-07-23 DIAGNOSIS — R531 Weakness: Secondary | ICD-10-CM | POA: Diagnosis not present

## 2024-07-23 DIAGNOSIS — M67813 Other specified disorders of tendon, right shoulder: Secondary | ICD-10-CM | POA: Diagnosis not present

## 2024-07-23 DIAGNOSIS — M25651 Stiffness of right hip, not elsewhere classified: Secondary | ICD-10-CM | POA: Diagnosis not present

## 2024-07-23 DIAGNOSIS — M25551 Pain in right hip: Secondary | ICD-10-CM | POA: Diagnosis not present

## 2024-07-24 ENCOUNTER — Ambulatory Visit
Admission: RE | Admit: 2024-07-24 | Discharge: 2024-07-24 | Disposition: A | Discharge status: Home / Self Care | Source: Ambulatory Visit | Attending: Pain Medicine | Admitting: Pain Medicine

## 2024-07-24 DIAGNOSIS — G959 Disease of spinal cord, unspecified: Secondary | ICD-10-CM

## 2024-07-24 DIAGNOSIS — M5416 Radiculopathy, lumbar region: Secondary | ICD-10-CM

## 2024-07-24 DIAGNOSIS — M47812 Spondylosis without myelopathy or radiculopathy, cervical region: Secondary | ICD-10-CM | POA: Diagnosis not present

## 2024-07-24 DIAGNOSIS — M48061 Spinal stenosis, lumbar region without neurogenic claudication: Secondary | ICD-10-CM | POA: Diagnosis not present

## 2024-07-24 DIAGNOSIS — M5116 Intervertebral disc disorders with radiculopathy, lumbar region: Secondary | ICD-10-CM | POA: Diagnosis not present

## 2024-07-24 DIAGNOSIS — M4802 Spinal stenosis, cervical region: Secondary | ICD-10-CM | POA: Diagnosis not present

## 2024-07-24 DIAGNOSIS — M4726 Other spondylosis with radiculopathy, lumbar region: Secondary | ICD-10-CM | POA: Diagnosis not present

## 2024-08-03 DIAGNOSIS — M67813 Other specified disorders of tendon, right shoulder: Secondary | ICD-10-CM | POA: Diagnosis not present

## 2024-08-03 DIAGNOSIS — M25651 Stiffness of right hip, not elsewhere classified: Secondary | ICD-10-CM | POA: Diagnosis not present

## 2024-08-03 DIAGNOSIS — M25551 Pain in right hip: Secondary | ICD-10-CM | POA: Diagnosis not present

## 2024-08-03 DIAGNOSIS — R531 Weakness: Secondary | ICD-10-CM | POA: Diagnosis not present

## 2024-08-18 ENCOUNTER — Inpatient Hospital Stay
Admission: RE | Admit: 2024-08-18 | Discharge: 2024-08-18 | Disposition: A | Discharge status: Home / Self Care | Payer: Self-pay | Source: Ambulatory Visit | Attending: Orthopedic Surgery | Admitting: Orthopedic Surgery

## 2024-08-18 ENCOUNTER — Other Ambulatory Visit: Payer: Self-pay | Admitting: Family Medicine

## 2024-08-18 DIAGNOSIS — Z125 Encounter for screening for malignant neoplasm of prostate: Secondary | ICD-10-CM | POA: Diagnosis not present

## 2024-08-18 DIAGNOSIS — Z049 Encounter for examination and observation for unspecified reason: Secondary | ICD-10-CM

## 2024-08-25 DIAGNOSIS — R350 Frequency of micturition: Secondary | ICD-10-CM | POA: Diagnosis not present

## 2024-08-25 DIAGNOSIS — N401 Enlarged prostate with lower urinary tract symptoms: Secondary | ICD-10-CM | POA: Diagnosis not present

## 2024-08-25 DIAGNOSIS — R3914 Feeling of incomplete bladder emptying: Secondary | ICD-10-CM | POA: Diagnosis not present

## 2024-08-26 NOTE — Progress Notes (Signed)
 Referring Physician:  Okey Carlin Redbird, MD [address] [place],  [place] [address]  Primary Physician:  Okey Carlin Redbird, MD  History of Present Illness: 08/30/2024 Alex West has a history of autoimmune hepatitis, BPH, hypercholesterolemia, hypothyroidism.   History of ACDF C3-C6 by Dr. Debby in 2023 for myelopathy.   He asw Dr. Renita on 07/09/24 and cervical/lumbar MRIs were ordered.   Symptoms started after above surgery. He has constant bilateral arm pain with burning/weakness to his hands. No alleviating or aggravating factors. Arms are sensitive to cold temperature. No significant neck pain.   Also with burning/weakness in both legs (anterior/posterior) to his feet with numbness, right leg > left leg pain. He uses a cane to ambulate. No LBP.   He thinks he gained some strength in PT and his walking is better. He does not feel like pain is better.   He states he only had numbness in right arm prior to surgery- he had no dexterity or balance issues.   He had EMG 11/2022 showing chronic mild right C5-6-7 cervical radiculopathy, and also chronic right L4-5 S1 lumbar radiculopathy.   Tobacco use: Does not smoke.   Bowel/Bladder Dysfunction: urinary urgency, no incontinence issues. No perineal numbness.   Conservative measures:  Physical therapy:  has participated in PT at [facility] in last year.  Multimodal medical therapy including regular antiinflammatories:  Tylenol , Methocarbamol , Oxycodone , Tizanidine , Baclofen Injections: no epidural steroid injections  Past Surgery:  07/23/2022 C3-4,C4-5,C5-6 ACDF Surgeon: Dr. Dorn Debby  Will [name] Molt has no dexterity issues. Still has some balance issues since surgery.   The symptoms are causing a significant impact on the patient's life.   Review of Systems:  A 10 point review of systems is negative, except for the pertinent positives and negatives detailed in the HPI.  Past Medical History: Past  Medical History:  Diagnosis Date   Arthritis    BPH (benign prostatic hyperplasia)    Colon polyp    ED (erectile dysfunction)    Elevated liver enzymes    Gallstones    Hepatitis    History of anemia    Hypercholesterolemia    Hypothyroidism    Mild tricuspid regurgitation    Mitral valve regurgitation    Thyroid  disease    Vertigo     Past Surgical History: Past Surgical History:  Procedure Laterality Date   ANTERIOR CERVICAL DECOMP/DISCECTOMY FUSION N/A 07/23/2022   Procedure: Anterior Cervical Discectomy and Fusion Cervical Three-Four/Four-Five/Five-Six;  Surgeon: Debby Dorn MATSU, MD;  Location: [facility] OR;  Service: Neurosurgery;  Laterality: N/A;  3C   FLEXIBLE SIGMOIDOSCOPY     TOTAL HIP ARTHROPLASTY Left 01/06/2023   Procedure: LEFT TOTAL HIP ARTHROPLASTY ANTERIOR APPROACH;  Surgeon: Liam Lerner, MD;  Location: [facility] ORS;  Service: Orthopedics;  Laterality: Left;    Allergies: Allergies as of 08/30/2024 - Review Complete 08/30/2024  Allergen Reaction Noted   Tadalafil Other (See Comments) 09/09/2021    Medications: Outpatient Encounter Medications as of 08/30/2024  Medication Sig   azaTHIOprine  (IMURAN ) 50 MG tablet Take 75 mg by mouth daily.   finasteride (PROSCAR) 5 MG tablet Take 5 mg by mouth daily.   levothyroxine  (SYNTHROID ) 200 MCG tablet Take 200 mcg by mouth daily before breakfast.   LINZESS 290 MCG CAPS capsule Take 290 mcg by mouth every morning.   Multiple Vitamins-Minerals (CENTRUM ADULT PO) Take 1 tablet by mouth daily.   sildenafil (VIAGRA) 100 MG tablet 1 tablet Orally Once a day; Duration: 30 days  As needed   silodosin (RAPAFLO) 8 MG CAPS capsule Take 8 mg by mouth daily.   [DISCONTINUED] aspirin  EC 81 MG tablet Take 1 tablet (81 mg total) by mouth 2 (two) times daily.   No facility-administered encounter medications on file as of 08/30/2024.    Social History: Social History   Tobacco Use   Smoking status: Former   Smokeless tobacco: Never   Advertising account planner   Vaping status: Never Used  Substance Use Topics   Alcohol use: Yes    Comment: occ   Drug use: No    Family Medical History: Family History  Problem Relation Age of Onset   Cancer Mother        pancreatic   Cancer Father        stomach   Kidney failure Brother    Other Brother        neck tumor   Hypertension Brother    Diabetes Brother    Lupus Daughter    Hypertension Sister    Other Son        boating accident    Physical Examination: Vitals:   08/30/24 1022  BP: 100/72    General: Patient is well developed, well nourished, calm, collected, and in no apparent distress. Attention to examination is appropriate.  Respiratory: Patient is breathing without any difficulty.   NEUROLOGICAL:     Awake, alert, oriented to person, place, and time.  Speech is clear and fluent. Fund of knowledge is appropriate.   Cranial Nerves: Pupils equal round and reactive to light.  Facial tone is symmetric.    Well healed cervical incision.   No abnormal lesions on exposed skin.   Strength: Side Biceps Triceps Deltoid Interossei Grip Wrist Ext. Wrist Flex.  R 4 4 4 4 4 4 4   L 4 4 4 4 4 4 4    Side Iliopsoas Quads Hamstring PF DF EHL  R 5 5 5 5 5 5   L 5 5 5 5 5 5    Reflexes are 1+ and symmetric at the biceps, brachioradialis, patella and achilles.   Hoffman's is absent.  Clonus is not present.   Bilateral upper and lower extremity sensation is intact to light touch.     No pain with IR/ER of both hips.   Gait is slow. He uses a cane.    Medical Decision Making  Imaging: Cervical MRI dated 07/24/24:  FINDINGS:   BONES AND ALIGNMENT: Cervical lordosis is maintained without significant listhesis. Artifact from hardware slightly limits evaluation of the vertebral bodies. Within this limitation, there is no bone marrow edema or evidence of fracture in the cervical spine. Anterior cervical fusion hardware spanning C3 to C6 is noted.   SPINAL CORD: Similar  volume loss of the cervical cord from the mid C3 level to the level of C4-5 with T2 signal abnormalities suggestive of myelomalacia. Elsewhere, the cervical cord is normal in caliber.   SOFT TISSUES: No paraspinal mass.   C2-C3: Small disc bulge. No significant spinal canal stenosis. Bilateral facet arthrosis and uncovertebral hypertrophy. Moderate bilateral foraminal stenosis.   C3-C4: Postsurgical change. Disc bulge indents the ventral thecal sac. Thickening of the ligaments and flavum indents the dorsal thecal sac. Moderate spinal canal stenosis. Bilateral facet arthrosis on the left with bilateral uncovertebral hypertrophy. Bilateral moderate neural foraminal stenosis, slightly greater on the left.   C4-C5: Postsurgical changes. Disc bulge indents the ventral thecal sac with flattening of the ventral cervical cord. Thickening of the ligaments and flavum.  Moderate spinal canal stenosis. Bilateral facet arthrosis and uncovertebral hypertrophy with moderate bilateral foraminal stenosis, slightly greater on the right.   C5-C6: Postsurgical changes. Disc bulge indents the ventral thecal sac with mild flattening of the ventral cord. Thickening of the ligaments and flavum. Mild spinal canal stenosis. Bilateral facet arthrosis and vertebral hypertrophy. Moderate bilateral foraminal stenosis, slightly greater on the left.   C6-C7: Disc osteophyte complex which indents the ventral thecal sac resulting in mild spinal canal stenosis. Bilateral facet arthrosis and uncovertebral hypertrophy. Moderate right and mild left foraminal stenosis.   C7-T1: Disc bulge with similar appearance to C6-C7. Bilateral facet arthrosis resulting in moderate bilateral foraminal stenosis.   IMPRESSION: 1. Degenerative and postsurgical changes as above, similar to prior. Moderate spinal canal stenosis at C3-4 and C4-5. Moderate foraminal stenosis at multiple levels. 2. Myelomalacia at C3 to C4-5 is  similar to prior.   Electronically signed by: Donnice Mania MD 07/29/2024 12:39 PM EDT RP Workstation: [id number]    I have personally reviewed the images and agree with the above interpretation.   Cervical xrays dated 08/30/24:  ACDF C3-C6, screws C6 appear to be broken (unchanged from 01/01/23).   Assessment and Plan: Mr. Pickering has a history of ACDF C3-C6 by Dr. Debby in 2023 for myelopathy. He states he only had numbness in right arm prior to surgery- he had no dexterity or balance issues.   Symptoms started after above surgery. He has constant bilateral arm pain with burning/weakness to his hands. No alleviating or aggravating factors. Arms are sensitive to cold temperature. No significant neck pain.   Also with burning/weakness in both legs (anterior/posterior) to his feet with numbness, right leg > left leg pain. He uses a cane to ambulate. No LBP.   He has known chronic myelomalacia C3-C5, ACDF C3-C6 with broken screws at C6.   Possible disc bulge seen at T10-T11 on lumbar MRI. Previous thoracic MRI 01/23/23 showed thoracic spondylosis with mild central stenosis T9-T10.   He has epidural lipomatosis with multilevel lumbar stenosis. Has slip L3-L4 with right foraminal disc and bilateral foraminal stenosis L4-L5.   He had EMG 11/2022 showing chronic mild right C5-6-7 cervical radiculopathy, and also chronic right L4-5 S1 lumbar radiculopathy.   Treatment options discussed with patient and following plan made:   - He was sent for cervical xrays on his way out.  - Dr. Claudene reviewed his imaging (including xrays from today) after his visit. He may be a candidate for surgery (?foraminotomy versus SCS).  - He was scheduled to follow up with Dr. Claudene on 09/08/24. He will likely need updated thoracic MRI, but will discuss further at his visit.   I called and discussed with patient- he is aware of appointment.   I spent a total of 45 minutes in face-to-face and non-face-to-face activities  related to this patient's care today including review of outside records, review of imaging, review of symptoms, physical exam, discussion of differential diagnosis, discussion of treatment options, and documentation.   Thank you for involving me in the care of this patient.   Glade Boys PA-C Dept. of Neurosurgery

## 2024-08-30 ENCOUNTER — Ambulatory Visit
Admission: RE | Admit: 2024-08-30 | Discharge: 2024-08-30 | Disposition: A | Discharge status: Home / Self Care | Source: Ambulatory Visit | Attending: Orthopedic Surgery | Admitting: Orthopedic Surgery

## 2024-08-30 ENCOUNTER — Ambulatory Visit (INDEPENDENT_AMBULATORY_CARE_PROVIDER_SITE_OTHER): Admitting: Orthopedic Surgery

## 2024-08-30 ENCOUNTER — Ambulatory Visit
Admission: RE | Admit: 2024-08-30 | Discharge: 2024-08-30 | Disposition: A | Discharge status: Home / Self Care | Attending: Orthopedic Surgery | Admitting: Orthopedic Surgery

## 2024-08-30 ENCOUNTER — Encounter: Payer: Self-pay | Admitting: Orthopedic Surgery

## 2024-08-30 VITALS — BP 100/72 | Ht 75.0 in | Wt 248.4 lb

## 2024-08-30 DIAGNOSIS — M48061 Spinal stenosis, lumbar region without neurogenic claudication: Secondary | ICD-10-CM

## 2024-08-30 DIAGNOSIS — M47812 Spondylosis without myelopathy or radiculopathy, cervical region: Secondary | ICD-10-CM | POA: Diagnosis not present

## 2024-08-30 DIAGNOSIS — S12200A Unspecified displaced fracture of third cervical vertebra, initial encounter for closed fracture: Secondary | ICD-10-CM | POA: Diagnosis not present

## 2024-08-30 DIAGNOSIS — G959 Disease of spinal cord, unspecified: Secondary | ICD-10-CM | POA: Diagnosis not present

## 2024-08-30 DIAGNOSIS — S12400A Unspecified displaced fracture of fifth cervical vertebra, initial encounter for closed fracture: Secondary | ICD-10-CM | POA: Diagnosis not present

## 2024-08-30 DIAGNOSIS — M5412 Radiculopathy, cervical region: Secondary | ICD-10-CM

## 2024-08-30 DIAGNOSIS — M5126 Other intervertebral disc displacement, lumbar region: Secondary | ICD-10-CM

## 2024-08-30 DIAGNOSIS — M4804 Spinal stenosis, thoracic region: Secondary | ICD-10-CM | POA: Diagnosis not present

## 2024-08-30 DIAGNOSIS — M47814 Spondylosis without myelopathy or radiculopathy, thoracic region: Secondary | ICD-10-CM | POA: Diagnosis not present

## 2024-08-30 DIAGNOSIS — G9589 Other specified diseases of spinal cord: Secondary | ICD-10-CM | POA: Diagnosis not present

## 2024-08-30 DIAGNOSIS — M5416 Radiculopathy, lumbar region: Secondary | ICD-10-CM

## 2024-08-30 DIAGNOSIS — E882 Lipomatosis, not elsewhere classified: Secondary | ICD-10-CM

## 2024-08-30 DIAGNOSIS — S12300A Unspecified displaced fracture of fourth cervical vertebra, initial encounter for closed fracture: Secondary | ICD-10-CM | POA: Diagnosis not present

## 2024-08-30 DIAGNOSIS — T84216A Breakdown (mechanical) of internal fixation device of vertebrae, initial encounter: Secondary | ICD-10-CM

## 2024-08-30 DIAGNOSIS — M5417 Radiculopathy, lumbosacral region: Secondary | ICD-10-CM

## 2024-08-30 DIAGNOSIS — S12500A Unspecified displaced fracture of sixth cervical vertebra, initial encounter for closed fracture: Secondary | ICD-10-CM | POA: Diagnosis not present

## 2024-08-30 NOTE — Patient Instructions (Signed)
 It was so nice to see you today. Thank you so much for coming in.    Previous nerve test showed a chronic cervical radiculopathy on right at C5-C7 and lumbar radiculopathy on right at L4-S1. This may explain some of your right arm and leg pain.   I ordered xrays of your neck. You can get these at [facility] (building with the white pillars) off of [address]. The address is [address] [place], [place] [address]. You do not need any appointment.   Once you have the xrays done, I will review with one of the surgeons and message you to regroup. You should hear from me by the end of the week.   Please do not hesitate to call if you have any questions or concerns. You can also message me in MyChart.   Glade Boys PA-C [phone]     The physicians and staff at [facility] are committed to providing excellent care. You may receive a survey asking for feedback about your experience at our office. We value you your feedback and appreciate you taking the time to to fill it out. The [facility] leadership team is also available to discuss your experience in person, feel free to contact us  [phone].

## 2024-09-01 ENCOUNTER — Ambulatory Visit (INDEPENDENT_AMBULATORY_CARE_PROVIDER_SITE_OTHER): Admitting: Sports Medicine

## 2024-09-01 ENCOUNTER — Other Ambulatory Visit: Payer: Self-pay

## 2024-09-01 ENCOUNTER — Other Ambulatory Visit (HOSPITAL_BASED_OUTPATIENT_CLINIC_OR_DEPARTMENT_OTHER): Payer: Self-pay

## 2024-09-01 VITALS — BP 120/70 | Ht 75.0 in | Wt 245.0 lb

## 2024-09-01 DIAGNOSIS — S76301A Unspecified injury of muscle, fascia and tendon of the posterior muscle group at thigh level, right thigh, initial encounter: Secondary | ICD-10-CM | POA: Diagnosis not present

## 2024-09-01 DIAGNOSIS — M25551 Pain in right hip: Secondary | ICD-10-CM

## 2024-09-01 MED ORDER — NAPROXEN 500 MG PO TABS
500.0000 mg | ORAL_TABLET | Freq: Two times a day (BID) | ORAL | 1 refills | Status: AC
  Filled 2024-09-01: qty 42, 21d supply, fill #0
  Filled 2024-10-07: qty 42, 21d supply, fill #1

## 2024-09-01 NOTE — Progress Notes (Signed)
   Subjective:    Patient ID: Alex West, male    DOB: 68 y.o., September 13, 1956   MRN: [id number]  HPI  Chief Complaint: Right hamstring pain  Alex West is a pleasant 68 year old male who noted pain in his right posterior thigh that started about 2 years ago he received surgery for a severe cervical stenosis myelomalacia status post C3-6 ACDF, with continued myelopathic neuropathic pain.  States that his hamstring has bothered him ever since and prior to this he had no significant injury to the hamstring.  No injury that he can recall causing this.  No prior surgery in his hip or knee.  Worse with most activities.  Present constantly.  Worsening as of late.       Objective:   Physical Exam Vitals:   09/01/24 0932  BP: 120/70   R Hip (compared to normal) -Inspection: No- swelling or skin changes. No- leg length discrepancy. No gait abnormalities with walking.  -Palpation: TTP - level ASIS, - level AIIS, - adductor, - greater trochanter, - SI joint, - over piriformis + in body of hamstring muscle -AROM/PROM: Flexion 110 deg, abduction 30  deg, adduction 20  deg, extension 10 deg, ER 40 deg, IR 20  deg, tight hamstring flexibility (tighter on affected side compared to contralateral side) -Strength: 5/5 flexion, 5/5 abduction, 5/5 adduction, 5/5 extension -Special tests: -  FADIR, -  log roll, + Debby, - Ober, - Noble, - FABER, - piriformis testing, - SLR, - hop test  Limited ultrasound exam of the right hamstring: Increased Doppler flow in the mid portion of the distal hamstring near semimembranosus Veil.  Slightly increased hypoechogenic fluid signal in this region.  Interpretation: Right Hamstring tendinopathy.     Assessment & Plan:   Alex West is a very pleasant 68 y.o. male with unfortunate 2-year history of right sided hamstring pain acutely really coinciding with a cervical spine surgery he had around that  time.  No prior problems or history of hamstring injuries preceding the surgery.  He has significant tenderness in the body of his hamstring muscles and there appears to be an area of tendinopathy visualized on ultrasound today.  He has not engaged in physical therapy as of yet for this and I believe he would benefit significantly from eccentric exercises in this region.  I provided an oral anti-inflammatory medicine to help optimize his participation in PT.  I recommend follow-up in 6 weeks.  Can consider advanced imaging if benefit/improvement is limited.

## 2024-09-06 ENCOUNTER — Other Ambulatory Visit: Payer: Self-pay

## 2024-09-06 ENCOUNTER — Ambulatory Visit: Admitting: Rehabilitative and Restorative Service Providers"

## 2024-09-06 ENCOUNTER — Encounter: Payer: Self-pay | Admitting: Rehabilitative and Restorative Service Providers"

## 2024-09-06 DIAGNOSIS — R2681 Unsteadiness on feet: Secondary | ICD-10-CM | POA: Diagnosis not present

## 2024-09-06 DIAGNOSIS — M6281 Muscle weakness (generalized): Secondary | ICD-10-CM | POA: Diagnosis not present

## 2024-09-06 DIAGNOSIS — M79651 Pain in right thigh: Secondary | ICD-10-CM | POA: Diagnosis not present

## 2024-09-06 DIAGNOSIS — R262 Difficulty in walking, not elsewhere classified: Secondary | ICD-10-CM | POA: Insufficient documentation

## 2024-09-06 DIAGNOSIS — R2689 Other abnormalities of gait and mobility: Secondary | ICD-10-CM | POA: Insufficient documentation

## 2024-09-06 DIAGNOSIS — S76301A Unspecified injury of muscle, fascia and tendon of the posterior muscle group at thigh level, right thigh, initial encounter: Secondary | ICD-10-CM | POA: Diagnosis not present

## 2024-09-06 NOTE — Progress Notes (Unsigned)
 Referring Physician:  Okey Carlin Redbird, MD [address] [place],  [place] [address]  Primary Physician:  Okey Carlin Redbird, MD  History of Present Illness: 09/08/2024 Alex West is a very pleasant 68 year old man here today following up after seeing [facility] for second opinion.  He has a history of cervical myeloradiculopathy that was diagnosed in 2023.  He had a anterior cervical discectomy and fusion from C3-C6 by Dr. Debby.  He states that initially after his ACDF that he had progressive worsening of his symptoms and required a significant amount of physical and Occupational Therapy.  He had to utilize a walker and felt like he was having a progressive decline.  He continues to have burning numbness and weakness in his hands as well as in his lower extremities.  His neck pain is limited.  He was previously given a prescription for gabapentin  but was uncomfortable with the side effects profile so did not complete the therapy.  He had continued workup including a repeat EMG which demonstrated cervical radiculopathy and lumbar radiculopathy that appeared to be chronic.  Tobacco use: Does not smoke.   Bowel/Bladder Dysfunction: urinary urgency, no incontinence issues. No perineal numbness.   Conservative measures:  Physical therapy:  has participated in PT at [facility] in last year.  Multimodal medical therapy including regular antiinflammatories:  Tylenol , Methocarbamol , Oxycodone , Tizanidine , Baclofen Injections: no epidural steroid injections  Past Surgery:  07/23/2022 C3-4,C4-5,C5-6 ACDF Surgeon: Dr. Dorn Debby  The symptoms are causing a significant impact on the patient's life.   Review of Systems:  A 10 point review of systems is negative, except for the pertinent positives and negatives detailed in the HPI.  Past Medical History: Past Medical History:  Diagnosis Date   Arthritis    BPH (benign prostatic hyperplasia)    Colon polyp    ED (erectile dysfunction)     Elevated liver enzymes    Gallstones    Hepatitis    History of anemia    Hypercholesterolemia    Hypothyroidism    Mild tricuspid regurgitation    Mitral valve regurgitation    Thyroid  disease    Vertigo     Past Surgical History: Past Surgical History:  Procedure Laterality Date   ANTERIOR CERVICAL DECOMP/DISCECTOMY FUSION N/A 07/23/2022   Procedure: Anterior Cervical Discectomy and Fusion Cervical Three-Four/Four-Five/Five-Six;  Surgeon: Debby Dorn MATSU, MD;  Location: [facility] OR;  Service: Neurosurgery;  Laterality: N/A;  3C   FLEXIBLE SIGMOIDOSCOPY     TOTAL HIP ARTHROPLASTY Left 01/06/2023   Procedure: LEFT TOTAL HIP ARTHROPLASTY ANTERIOR APPROACH;  Surgeon: Liam Lerner, MD;  Location: [facility] ORS;  Service: Orthopedics;  Laterality: Left;    Allergies: Allergies as of 09/08/2024 - Review Complete 09/08/2024  Allergen Reaction Noted   Tadalafil Other (See Comments) 09/09/2021    Medications: Outpatient Encounter Medications as of 09/08/2024  Medication Sig   azaTHIOprine  (IMURAN ) 50 MG tablet Take 75 mg by mouth daily.   levothyroxine  (SYNTHROID ) 200 MCG tablet Take 200 mcg by mouth daily before breakfast.   LINZESS 290 MCG CAPS capsule Take 290 mcg by mouth every morning.   Multiple Vitamins-Minerals (CENTRUM ADULT PO) Take 1 tablet by mouth daily.   naproxen  (NAPROSYN ) 500 MG tablet Take 1 tablet (500 mg total) by mouth 2 (two) times daily with a meal.   sildenafil (VIAGRA) 100 MG tablet 1 tablet Orally Once a day; Duration: 30 days As needed   finasteride (PROSCAR) 5 MG tablet Take 5 mg by mouth daily. (  Patient not taking: Reported on 09/08/2024)   silodosin (RAPAFLO) 8 MG CAPS capsule Take 8 mg by mouth daily. (Patient not taking: Reported on 09/08/2024)   No facility-administered encounter medications on file as of 09/08/2024.    Social History: Social History   Tobacco Use   Smoking status: Former   Smokeless tobacco: Never   Tobacco comments:    Has 1 cigar once  a month maybe  Vaping Use   Vaping status: Never Used  Substance Use Topics   Alcohol use: Yes    Comment: occ   Drug use: No    Family Medical History: Family History  Problem Relation Age of Onset   Cancer Mother        pancreatic   Cancer Father        stomach   Kidney failure Brother    Other Brother        neck tumor   Hypertension Brother    Diabetes Brother    Lupus Daughter    Hypertension Sister    Other Son        boating accident    Physical Examination: Vitals:   09/08/24 1005  BP: 134/80     General: Patient is well developed, well nourished, calm, collected, and in no apparent distress. Attention to examination is appropriate.  Respiratory: Patient is breathing without any difficulty.   NEUROLOGICAL:     Awake, alert, oriented to person, place, and time.  Speech is clear and fluent. Fund of knowledge is appropriate.   Cranial Nerves: Pupils equal round and reactive to light.  Facial tone is symmetric.    Well healed cervical incision.   No abnormal lesions on exposed skin.   Strength: Side Biceps Triceps Deltoid Interossei Grip Wrist Ext. Wrist Flex.  R 4 4 4 4 4 4 4   L 4 4 4 4 4 4 4    Side Iliopsoas Quads Hamstring PF DF EHL  R 5 5 5 5 5 5   L 5 5 5 5 5 5    Reflexes are 3+ and symmetric at the biceps, brachioradialis, patella and achilles.  Tromner's reflex present bilaterally.  Has increased tone so difficult to assess his clonus.  He has crossed adductor reflexes noted.  Gait is slow. He uses a cane.    Medical Decision Making  Imaging: Cervical MRI dated 07/24/24:  FINDINGS:   BONES AND ALIGNMENT: Cervical lordosis is maintained without significant listhesis. Artifact from hardware slightly limits evaluation of the vertebral bodies. Within this limitation, there is no bone marrow edema or evidence of fracture in the cervical spine. Anterior cervical fusion hardware spanning C3 to C6 is noted.   SPINAL CORD: Similar volume loss  of the cervical cord from the mid C3 level to the level of C4-5 with T2 signal abnormalities suggestive of myelomalacia. Elsewhere, the cervical cord is normal in caliber.   SOFT TISSUES: No paraspinal mass.   C2-C3: Small disc bulge. No significant spinal canal stenosis. Bilateral facet arthrosis and uncovertebral hypertrophy. Moderate bilateral foraminal stenosis.   C3-C4: Postsurgical change. Disc bulge indents the ventral thecal sac. Thickening of the ligaments and flavum indents the dorsal thecal sac. Moderate spinal canal stenosis. Bilateral facet arthrosis on the left with bilateral uncovertebral hypertrophy. Bilateral moderate neural foraminal stenosis, slightly greater on the left.   C4-C5: Postsurgical changes. Disc bulge indents the ventral thecal sac with flattening of the ventral cervical cord. Thickening of the ligaments and flavum. Moderate spinal canal stenosis. Bilateral facet arthrosis and  uncovertebral hypertrophy with moderate bilateral foraminal stenosis, slightly greater on the right.   C5-C6: Postsurgical changes. Disc bulge indents the ventral thecal sac with mild flattening of the ventral cord. Thickening of the ligaments and flavum. Mild spinal canal stenosis. Bilateral facet arthrosis and vertebral hypertrophy. Moderate bilateral foraminal stenosis, slightly greater on the left.   C6-C7: Disc osteophyte complex which indents the ventral thecal sac resulting in mild spinal canal stenosis. Bilateral facet arthrosis and uncovertebral hypertrophy. Moderate right and mild left foraminal stenosis.   C7-T1: Disc bulge with similar appearance to C6-C7. Bilateral facet arthrosis resulting in moderate bilateral foraminal stenosis.   IMPRESSION: 1. Degenerative and postsurgical changes as above, similar to prior. Moderate spinal canal stenosis at C3-4 and C4-5. Moderate foraminal stenosis at multiple levels. 2. Myelomalacia at C3 to C4-5 is similar to  prior.   Electronically signed by: Donnice Mania MD 07/29/2024 12:39 PM EDT RP Workstation: [id number]    I have personally reviewed the images and agree with the above interpretation.   Cervical xrays dated 08/30/24:  ACDF C3-C6, screws C6 appear to be broken (unchanged from 01/01/23).   Assessment and Plan: Alex West has a history of ACDF C3-C6 by Dr. Debby in 2023 for myelopathy.  We reviewed his case overall.  We spent time going through his preoperative imaging which demonstrated cervical spondylosis with cervical myelomalacia and also reviewed his preoperative exam from neurology which did show some signs concerning for possible early myelopathy.  After surgery he felt a progressive decline and had worsening gait and worsening numbness and tingling in his upper and lower extremities on both sides.  We had a long discussion about cervical myelomalacia as well as myelopathy and discussed that there is a not insignificant subset of patients who have progressive symptoms even in the setting of decompression especially patients with significant cervical myelomalacia.  Some of the patients have worsening ambulatory issues because they were previously dependent on increased tone in their lower extremities and when they are decompressed they lose some of this tone.  His physical examination still does show signs sign is consistent with myelopathy, he has crossed adductor reflexes, he does have some evidence of spasticity noted mostly in his ankles and wrists.  He states that if he moves too fast he will get spastic contractures or clonic like movements in his extremities.  I did discuss if he wanted to have a more complete workup we could get a cross-sectional image of his thoracic spine as there was some evidence of spondylosis there previously, however with his previous intervention he felt like he would not undergo any more spine surgery at this point and we decided not to move forward with any further  cross-sectional imaging.  He has had significant gains with physical therapy through his hard work and dedication to his recovery regimen.  I do think he may benefit from an evaluation with Dr. Cornelio the spinal cord injury team as patients with significant myelomalacia and myelopathy often have many of the symptoms consistent with patient's who have traumatic spinal cord injuries.  He is showing some of the symptoms including spasticity and gait abnormalities as well as neuropathic pain.  I will plan to make a referral over to Dr. Lovorn.  I spent a total of 45 minutes in face-to-face and non-face-to-face activities related to this patient's care today including review of outside records, review of imaging, review of symptoms, physical exam, discussion of differential diagnosis, discussion of treatment options, and documentation.  Thank you for involving me in the care of this patient.   Penne LELON Sharps MD Dept. of Neurosurgery

## 2024-09-06 NOTE — Therapy (Signed)
 OUTPATIENT PHYSICAL THERAPY LOWER EXTREMITY EVALUATION   Patient Name: Alex West MRN: [id number] DOB:[birth year], 68 y.o., male Today's Date: 09/06/2024  END OF SESSION:  PT End of Session - 09/06/24 1334     Visit Number 1    Number of Visits 16    Date for PT Re-Evaluation 11/05/24    Authorization Type [employer]    PT Start Time 1320    PT Stop Time 1400    PT Time Calculation (min) 40 min    Activity Tolerance Patient tolerated treatment well    Behavior During Therapy WFL for tasks assessed/performed         Past Medical History:  Diagnosis Date   Arthritis    BPH (benign prostatic hyperplasia)    Colon polyp    ED (erectile dysfunction)    Elevated liver enzymes    Gallstones    Hepatitis    History of anemia    Hypercholesterolemia    Hypothyroidism    Mild tricuspid regurgitation    Mitral valve regurgitation    Thyroid  disease    Vertigo    Past Surgical History:  Procedure Laterality Date   ANTERIOR CERVICAL DECOMP/DISCECTOMY FUSION N/A 07/23/2022   Procedure: Anterior Cervical Discectomy and Fusion Cervical Three-Four/Four-Five/Five-Six;  Surgeon: Debby Dorn MATSU, MD;  Location: [facility] OR;  Service: Neurosurgery;  Laterality: N/A;  3C   FLEXIBLE SIGMOIDOSCOPY     TOTAL HIP ARTHROPLASTY Left 01/06/2023   Procedure: LEFT TOTAL HIP ARTHROPLASTY ANTERIOR APPROACH;  Surgeon: Liam Lerner, MD;  Location: [facility] ORS;  Service: Orthopedics;  Laterality: Left;   Patient Active Problem List   Diagnosis Date Noted   UTI (urinary tract infection) 01/24/2023   Sepsis (HCC) 01/24/2023   Bacteremia 01/24/2023   AKI (acute kidney injury) (HCC) 01/23/2023   Arthritis of left hip 01/01/2023   Paresthesia 08/21/2022   Stenosis of cervical spine with myelopathy (HCC) 07/23/2022   Gait abnormality 06/18/2022   Cervical myelopathy (HCC) 06/17/2022   Debility 06/06/2022   Neck pain 06/06/2022   OA (osteoarthritis) of hip 07/24/2021   Immunosuppressed status (HCC)  09/05/2014   Hepatitis, autoimmune (HCC) 09/05/2014    PCP: Ladonna Dale Gull, MD REFERRING PROVIDER: Redell Robes, DO REFERRING DIAG: [phone] (ICD-10-CM) - Hamstring injury, right, initial encounter  THERAPY DIAG:  Muscle weakness (generalized)  Pain in right thigh  Other abnormalities of gait and mobility  Right hamstring tendinopathy. Chronic, ongoing for 2 years. Please use dry needling.   Rationale for Evaluation and Treatment: Rehabilitation  ONSET DATE: 09/01/24  SUBJECTIVE:   SUBJECTIVE STATEMENT: The patient is s/p cervical spine surgery 07/2022. He began at that time with R hamstring pain. He was also dx with peripheral neuropathy. He reports his pain is focal to R hamstring distal to IT and above the knee. He reports neuropathy is from bilateral shoulders down into hands and legs from waist down into feet are burning. It's almost uncomfortable to keep shoes on.  He reports general weakness since his neck surgery.  He recently finished at [facility]-- he has been getting PT off and on since surgery.  He recalls an immediate sensation of walking in quick sand after neck surgery.  He feels like he is losing some strength in HS muscle. His primary goal is reducing pain. He wants to try dry needling.  PERTINENT HISTORY: 07/2022 cervical spine surgery, 12/2022 L hip replacement.  PAIN:  Are you having pain? Yes: NPRS scale: 8/10 pain at rest it stays at an 8.  Pain location: R posterior thigh Pain description: pain and weakness Aggravating factors: mostly with walking Relieving factors: Cream, patches, a sleeve  Other pain: shoulders into arms/fingers and hips down numbness and weakness in all extremities. No neck pain  PRECAUTIONS: None  RED FLAGS: The patient reports constipation with bowels since surgery and frequent urination with bladder, no incontinence.    WEIGHT BEARING RESTRICTIONS: No  FALLS:  Has patient fallen in last 6 months? Yes. Number of falls  1-- leg buckled  LIVING ENVIRONMENT: Lives with: lives with their spouse Lives in: House/apartment Stairs: can live on one level but has steps to home gym-- exercises daily M-F Has following equipment at home: Single point cane  OCCUPATION: retired  PLOF: Independent with numbness/tingling R arm (into last 2 fingers). Modified indep with cane since the surgery 8/23.  PATIENT GOALS: Getting rid of pain, gain strength and balance.   OBJECTIVE:  Note: Objective measures were completed at Evaluation unless otherwise noted.  DIAGNOSTIC FINDINGS:  He had severe cervical stenosis including myelomalacia and severe stenosis at C3-4, moderate-to-severe stenosis at C4-5, and severe stenosis at C5-6.   NCV with EMG 11/2022: Conclusion: This is an abnormal study.  There is electrodiagnostic evidence of chronic mild right C5-6-7 cervical radiculopathy, and also chronic right L4-5 S1 lumbar radiculopathy.  There is no evidence of large fiber peripheral neuropathy, or upper extremity focal neuropathy.   PATIENT SURVEYS:  Neuro and ortho diagnoses  COGNITION: Overall cognitive status: Within functional limits for tasks assessed     SENSATION: abnormality in sensation t/o bilat Ues and Les   PALPATION:           No specific tenderness noted with palpation today--- pain stays consistent t/o session  LOWER EXTREMITY ROM: Dec'd L hip ER as compared to R  LOWER EXTREMITY MMT: MMT Right eval Left eval  Hip flexion 5/5 5/5  Hip extension 3/5 3/5  Hip abduction 3+/5 3/5  Hip adduction    Hip internal rotation 4/5 4/5  Hip external rotation 4/5 3/5  Knee flexion 5/5 5/5  Knee extension 5/5 5/5  Ankle dorsiflexion 5/5 5/5  Ankle plantarflexion    Ankle inversion    Ankle eversion     (Blank rows = not tested)  GAIT: Distance walked: Gait with SPC into/out of clinic with slow speed-- Cane utilized on R UE. Assistive device utilized: Single point cane Level of assistance: Modified  independence                                                                                                                              [facility] Adult PT Treatment:                                                DATE: 09/06/24 Therapeutic Exercise: Prone  Hip  extension x 10 reps bilat Knee flexion  x 10 reps bilat Sidelying Hip abduction x 10 reps bilat Supine Bridging adding ankle DF, the attempting physioball bridge + knee curls bilat== unable to tolerate Manual Therapy: Self mobilization and release of HS with massage ball and hip IR/ER  PATIENT EDUCATION:  Education details: HEP, dry needling eduction (need to provide handout), PT plan of care Person educated: Patient Education method: Explanation, Demonstration, and Handouts Education comprehension: verbalized understanding and returned demonstration  HOME EXERCISE PROGRAM: Access Code: [email or web address] URL: [email or web address] Date: 09/06/2024 Prepared by: Tawni Ferrier  Exercises - Sidelying Hip Abduction  - 1 x daily - 4 x weekly - 1 sets - 10 reps - Prone Hip Extension  - 1 x daily - 4 x weekly - 1 sets - 10 reps - Prone Knee Flexion AROM  - 1 x daily - 4 x weekly - 1 sets - 10 reps - Calf Mobilization with Small Ball  - 1 x daily - 4 x weekly - 1 sets - 1 reps - 2 minutes hold  ASSESSMENT:  CLINICAL IMPRESSION: Patient is a 68 y.o. male who was seen today for physical therapy evaluation and treatment for R hamstring tendinopathy. His symptoms began s/p cervical decompression surgery in 07/2022. He has been receiving therapy intermittently since that time. He presents today with bilateral hip weakness, R HS weakness, pain that is constant at an 8/10. He does not report low back pain. He does report a sensation of general weakness in Les and changes to sensation in bilat Ues and Les. PT and patient discussed long h/o therapy and what his true goals for care are. He is most interested in managing HS pain. PT also did  a thorough review of his medical record. He did have NCV in 12/23, which showed some evidence of R lumbar radiculopathy. PT to further assess response to interventions to determine effectiveness of treatment. We also discussed more focal HS and glut strength. He has avoided some of this due to concern of flaring pain (however pain did not change in session today).   OBJECTIVE IMPAIRMENTS: Abnormal gait, decreased ROM, decreased strength, increased fascial restrictions, impaired sensation, and pain.   ACTIVITY LIMITATIONS: carrying, standing, squatting, and locomotion level  PARTICIPATION LIMITATIONS: community activity  PERSONAL FACTORS: Time since onset of injury/illness/exacerbation and 1-2 comorbidities: spinal surgery and hip surgery are also affecting patient's functional outcome.   REHAB POTENTIAL: Good  CLINICAL DECISION MAKING: Evolving/moderate complexity  EVALUATION COMPLEXITY: Moderate   GOALS: Goals reviewed with patient? Yes  SHORT TERM GOALS: Target date: 10/05/24  The patient will be indep with initial HEP Baseline: initiated at eval Goal status: INITIAL  2.  The patient will report reduced R HS pain to < or equal to 6/10.  Baseline:  8/10, constant Goal status: INITIAL  3.  The patient will improve bilat hip abduction strength to > or equal to 4/5. Baseline:  See above Goal status: INITIAL  LONG TERM GOALS: Target date: 11/05/24  The patient will be indep with progression of HEP. Baseline:  initiated at eval Goal status: INITIAL  2.  The patient will report pain < or equal to 4/10 at rest. Baseline: 8/10 Goal status: INITIAL  3.  The patient will verbalize strategies to reduce pain to manage chronic deficits. Baseline:  will instruct in self massage/ self release, and post d/c activities for pain mgmt. Goal status: INITIAL  PLAN:  PT FREQUENCY: 1-2x/week  PT DURATION: 8 weeks  PLANNED INTERVENTIONS:  02835- PT Re-evaluation, 97110-Therapeutic  exercises, 97530- Therapeutic activity, [id number]- Neuromuscular re-education, [id number]- Self Care, [id number]- Manual therapy, [id number]- Gait training, [phone]- Electrical stimulation (unattended), [phone]- Electrical stimulation (manual), [id number]- Traction (mechanical), [phone] (1-2 muscles), [id number] (3+ muscles)- Dry Needling, Patient/Family education, Balance training, Stair training, and Taping  PLAN FOR NEXT SESSION: check HEP, PERFORM DN NEXT SESSION-- have patient review handouts, sign wavier, etc. Focal HS eccentric strengthening, isometric contract/relax, work up to bridge with knee curls, standing eccentric load.   Gamble Enderle, PT 09/06/2024, 3:12 PM

## 2024-09-08 ENCOUNTER — Ambulatory Visit (INDEPENDENT_AMBULATORY_CARE_PROVIDER_SITE_OTHER): Admitting: Neurosurgery

## 2024-09-08 ENCOUNTER — Encounter: Payer: Self-pay | Admitting: Neurosurgery

## 2024-09-08 VITALS — BP 134/80 | Ht 75.0 in | Wt 250.2 lb

## 2024-09-08 DIAGNOSIS — R269 Unspecified abnormalities of gait and mobility: Secondary | ICD-10-CM

## 2024-09-08 DIAGNOSIS — G959 Disease of spinal cord, unspecified: Secondary | ICD-10-CM | POA: Diagnosis not present

## 2024-09-13 DIAGNOSIS — K760 Fatty (change of) liver, not elsewhere classified: Secondary | ICD-10-CM | POA: Diagnosis not present

## 2024-09-13 DIAGNOSIS — K754 Autoimmune hepatitis: Secondary | ICD-10-CM | POA: Diagnosis not present

## 2024-09-13 DIAGNOSIS — D849 Immunodeficiency, unspecified: Secondary | ICD-10-CM | POA: Diagnosis not present

## 2024-09-13 DIAGNOSIS — K7401 Hepatic fibrosis, early fibrosis: Secondary | ICD-10-CM | POA: Diagnosis not present

## 2024-09-14 ENCOUNTER — Ambulatory Visit: Admitting: Rehabilitative and Restorative Service Providers"

## 2024-09-14 ENCOUNTER — Encounter: Payer: Self-pay | Admitting: Rehabilitative and Restorative Service Providers"

## 2024-09-14 DIAGNOSIS — K754 Autoimmune hepatitis: Secondary | ICD-10-CM | POA: Diagnosis not present

## 2024-09-14 DIAGNOSIS — R2689 Other abnormalities of gait and mobility: Secondary | ICD-10-CM

## 2024-09-14 DIAGNOSIS — K5904 Chronic idiopathic constipation: Secondary | ICD-10-CM | POA: Diagnosis not present

## 2024-09-14 DIAGNOSIS — M6281 Muscle weakness (generalized): Secondary | ICD-10-CM | POA: Diagnosis not present

## 2024-09-14 DIAGNOSIS — M79651 Pain in right thigh: Secondary | ICD-10-CM

## 2024-09-14 DIAGNOSIS — R262 Difficulty in walking, not elsewhere classified: Secondary | ICD-10-CM | POA: Diagnosis not present

## 2024-09-14 DIAGNOSIS — S76301A Unspecified injury of muscle, fascia and tendon of the posterior muscle group at thigh level, right thigh, initial encounter: Secondary | ICD-10-CM | POA: Diagnosis not present

## 2024-09-14 DIAGNOSIS — R2681 Unsteadiness on feet: Secondary | ICD-10-CM | POA: Diagnosis not present

## 2024-09-14 NOTE — Patient Instructions (Signed)

## 2024-09-14 NOTE — Therapy (Signed)
 OUTPATIENT PHYSICAL THERAPY LOWER EXTREMITY TREATMENT   Patient Name: Alex West MRN: [id number] DOB:1956/12/22, 68 y.o., male Today's Date: 09/14/2024  END OF SESSION:  PT End of Session - 09/14/24 1354     Visit Number 2    Number of Visits 16    Date for Recertification  11/05/24    Authorization Type [employer]    PT Start Time 1400    PT Stop Time 1443    PT Time Calculation (min) 43 min    Activity Tolerance Patient tolerated treatment well    Behavior During Therapy WFL for tasks assessed/performed          Past Medical History:  Diagnosis Date   Arthritis    BPH (benign prostatic hyperplasia)    Colon polyp    ED (erectile dysfunction)    Elevated liver enzymes    Gallstones    Hepatitis    History of anemia    Hypercholesterolemia    Hypothyroidism    Mild tricuspid regurgitation    Mitral valve regurgitation    Thyroid  disease    Vertigo    Past Surgical History:  Procedure Laterality Date   ANTERIOR CERVICAL DECOMP/DISCECTOMY FUSION N/A 07/23/2022   Procedure: Anterior Cervical Discectomy and Fusion Cervical Three-Four/Four-Five/Five-Six;  Surgeon: Debby Dorn MATSU, MD;  Location: [facility] OR;  Service: Neurosurgery;  Laterality: N/A;  3C   FLEXIBLE SIGMOIDOSCOPY     TOTAL HIP ARTHROPLASTY Left 01/06/2023   Procedure: LEFT TOTAL HIP ARTHROPLASTY ANTERIOR APPROACH;  Surgeon: Liam Lerner, MD;  Location: [facility] ORS;  Service: Orthopedics;  Laterality: Left;   Patient Active Problem List   Diagnosis Date Noted   UTI (urinary tract infection) 01/24/2023   Sepsis (HCC) 01/24/2023   Bacteremia 01/24/2023   AKI (acute kidney injury) 01/23/2023   Arthritis of left hip 01/01/2023   Paresthesia 08/21/2022   Stenosis of cervical spine with myelopathy (HCC) 07/23/2022   Gait abnormality 06/18/2022   Cervical myelopathy (HCC) 06/17/2022   Debility 06/06/2022   Neck pain 06/06/2022   OA (osteoarthritis) of hip 07/24/2021   Immunosuppressed status 09/05/2014    Hepatitis, autoimmune (HCC) 09/05/2014    PCP: Ladonna Dale Gull, MD REFERRING PROVIDER: Redell Robes, DO REFERRING DIAG: [phone] (ICD-10-CM) - Hamstring injury, right, initial encounter  THERAPY DIAG:  Pain in right thigh  Muscle weakness (generalized)  Other abnormalities of gait and mobility  Right hamstring tendinopathy. Chronic, ongoing for 2 years. Please use dry needling.   Rationale for Evaluation and Treatment: Rehabilitation  ONSET DATE: 09/01/24  SUBJECTIVE:   SUBJECTIVE STATEMENT: The patient feels his new medications (anti-inflammatories) may be reducing intensity of pain in R hamstring and thigh.  EVAL: The patient is s/p cervical spine surgery 07/2022. He began at that time with R hamstring pain. He was also dx with peripheral neuropathy. He reports his pain is focal to R hamstring distal to IT and above the knee. He reports neuropathy is from bilateral shoulders down into hands and legs from waist down into feet are burning. It's almost uncomfortable to keep shoes on.  He reports general weakness since his neck surgery.  He recently finished at [facility] PT-- he has been getting PT off and on since surgery.  He recalls an immediate sensation of walking in quick sand after neck surgery.  He feels like he is losing some strength in HS muscle. His primary goal is reducing pain. He wants to try dry needling.  PERTINENT HISTORY: 07/2022 cervical spine surgery, 12/2022 L hip replacement.  PAIN:  Are you having pain? Yes: NPRS scale: 8/10 pain at rest it stays at an 8.  Pain location: R posterior thigh Pain description: pain and weakness Aggravating factors: mostly with walking Relieving factors: Cream, patches, a sleeve  Other pain: shoulders into arms/fingers and hips down numbness and weakness in all extremities. No neck pain  PRECAUTIONS: None  RED FLAGS: The patient reports constipation with bowels since surgery and frequent urination with bladder, no  incontinence.    WEIGHT BEARING RESTRICTIONS: No  FALLS:  Has patient fallen in last 6 months? Yes. Number of falls 1-- leg buckled  LIVING ENVIRONMENT: Lives with: lives with their spouse Lives in: House/apartment Stairs: can live on one level but has steps to home gym-- exercises daily M-F Has following equipment at home: Single point cane  OCCUPATION: retired  PLOF: Independent with numbness/tingling R arm (into last 2 fingers). Modified indep with cane since the surgery 8/23.  PATIENT GOALS: Getting rid of pain, gain strength and balance.   OBJECTIVE:  Note: Objective measures were completed at Evaluation unless otherwise noted.  DIAGNOSTIC FINDINGS:  He had severe cervical stenosis including myelomalacia and severe stenosis at C3-4, moderate-to-severe stenosis at C4-5, and severe stenosis at C5-6.   NCV with EMG 11/2022: Conclusion: This is an abnormal study.  There is electrodiagnostic evidence of chronic mild right C5-6-7 cervical radiculopathy, and also chronic right L4-5 S1 lumbar radiculopathy.  There is no evidence of large fiber peripheral neuropathy, or upper extremity focal neuropathy.   PATIENT SURVEYS:  Neuro and ortho diagnoses  COGNITION: Overall cognitive status: Within functional limits for tasks assessed     SENSATION: abnormality in sensation t/o bilat Ues and Les   PALPATION:           No specific tenderness noted with palpation today--- pain stays consistent t/o session  LOWER EXTREMITY ROM: Dec'd L hip ER as compared to R  LOWER EXTREMITY MMT: MMT Right eval Left eval  Hip flexion 5/5 5/5  Hip extension 3/5 3/5  Hip abduction 3+/5 3/5  Hip adduction    Hip internal rotation 4/5 4/5  Hip external rotation 4/5 3/5  Knee flexion 5/5 5/5  Knee extension 5/5 5/5  Ankle dorsiflexion 5/5 5/5  Ankle plantarflexion    Ankle inversion    Ankle eversion     (Blank rows = not tested)  GAIT: Distance walked: Gait with SPC into/out of clinic  with slow speed-- Cane utilized on R UE. Assistive device utilized: Single point cane Level of assistance: Modified independence                          [facility] Adult PT Treatment:                                                DATE: 09/14/24 Therapeutic Exercise: Standing Hip adductor stretch Supine Hamstring stretch and adductor stretch with stretch out strap x 2 reps R and L sides with prolonged holding Manual Therapy: STM in prone on R hamstring (medial portion) STM in supine on R adductor brevis and adductor magnus  Dry Needling: Trigger Point Dry Needling Initial Treatment: Pt instructed on Dry Needling rational, procedures, and possible side effects. Pt instructed to expect mild to moderate muscle soreness later in the day and/or into the next day.  Pt  instructed in methods to reduce muscle soreness. Pt instructed to continue prescribed HEP. Patient verbalized understanding of these instructions and education.  Patient Verbal Consent Given: Yes Education Handout Provided: Yes Muscles Treated: hip adductor and hamstring musculature R leg x 4 needles Electrical Stimulation Performed: No Treatment Response/Outcome: palpable lengthening, twitch response                                                                                                        [facility] Adult PT Treatment:                                                DATE: 09/06/24 Therapeutic Exercise: Prone  Hip extension x 10 reps bilat Knee flexion  x 10 reps bilat Sidelying Hip abduction x 10 reps bilat Supine Bridging adding ankle DF, the attempting physioball bridge + knee curls bilat== unable to tolerate Manual Therapy: Self mobilization and release of HS with massage ball and hip IR/ER  PATIENT EDUCATION:  Education details: HEP, dry needling eduction (need to provide handout), PT plan of care Person educated: Patient Education method: Explanation, Demonstration, and Handouts Education comprehension:  verbalized understanding and returned demonstration  HOME EXERCISE PROGRAM: Access Code: [email or web address] URL: [email or web address] Date: 09/14/2024 Prepared by: Tawni Ferrier  Exercises - Sidelying Hip Abduction  - 1 x daily - 4 x weekly - 1 sets - 10 reps - Prone Hip Extension  - 1 x daily - 4 x weekly - 1 sets - 10 reps - Prone Knee Flexion AROM  - 1 x daily - 4 x weekly - 1 sets - 10 reps - Calf Mobilization with Small Ball  - 1 x daily - 4 x weekly - 1 sets - 1 reps - 2 minutes hold - Hip Adductors and Hamstring Stretch with Strap  - 1 x daily - 5 x weekly - 1 sets - 2 reps - 30 seconds hold - Lateral Lunge Adductor Stretch with Counter Support  - 1 x daily - 5 x weekly - 1 sets - 2 reps - 30 seconds hold  ASSESSMENT: CLINICAL IMPRESSION: The patient has significant pain and tightness in R hip adductors and R medial hamstring. He tolerated dry needling with improved tissue lengthening. PT also added further stretching/lengthening for adductor tendon. PT to continue to progress by adding adductor strengthening and hamstring progression.  EVAL: Patient is a 68 y.o. male who was seen today for physical therapy evaluation and treatment for R hamstring tendinopathy. His symptoms began s/p cervical decompression surgery in 07/2022. He has been receiving therapy intermittently since that time. He presents today with bilateral hip weakness, R HS weakness, pain that is constant at an 8/10. He does not report low back pain. He does report a sensation of general weakness in Les and changes to sensation in bilat Ues and Les. PT and patient discussed long h/o therapy and what his true goals for care are. He is most  interested in managing HS pain. PT also did a thorough review of his medical record. He did have NCV in 12/23, which showed some evidence of R lumbar radiculopathy. PT to further assess response to interventions to determine effectiveness of treatment. We also discussed more focal HS  and glut strength. He has avoided some of this due to concern of flaring pain (however pain did not change in session today).   OBJECTIVE IMPAIRMENTS: Abnormal gait, decreased ROM, decreased strength, increased fascial restrictions, impaired sensation, and pain.   ACTIVITY LIMITATIONS: carrying, standing, squatting, and locomotion level  PARTICIPATION LIMITATIONS: community activity  PERSONAL FACTORS: Time since onset of injury/illness/exacerbation and 1-2 comorbidities: spinal surgery and hip surgery are also affecting patient's functional outcome.   REHAB POTENTIAL: Good  CLINICAL DECISION MAKING: Evolving/moderate complexity  EVALUATION COMPLEXITY: Moderate   GOALS: Goals reviewed with patient? Yes  SHORT TERM GOALS: Target date: 10/05/24  The patient will be indep with initial HEP Baseline: initiated at eval Goal status: INITIAL  2.  The patient will report reduced R HS pain to < or equal to 6/10.  Baseline:  8/10, constant Goal status: INITIAL  3.  The patient will improve bilat hip abduction strength to > or equal to 4/5. Baseline:  See above Goal status: INITIAL  LONG TERM GOALS: Target date: 11/05/24  The patient will be indep with progression of HEP. Baseline:  initiated at eval Goal status: INITIAL  2.  The patient will report pain < or equal to 4/10 at rest. Baseline: 8/10 Goal status: INITIAL  3.  The patient will verbalize strategies to reduce pain to manage chronic deficits. Baseline:  will instruct in self massage/ self release, and post d/c activities for pain mgmt. Goal status: INITIAL  PLAN:  PT FREQUENCY: 1-2x/week  PT DURATION: 8 weeks  PLANNED INTERVENTIONS: [id number]- PT Re-evaluation, 97110-Therapeutic exercises, 97530- Therapeutic activity, [id number]- Neuromuscular re-education, 97535- Self Care, [id number]- Manual therapy, [id number]- Gait training, [phone]- Electrical stimulation (unattended), [phone]- Electrical stimulation (manual), [id number]- Traction  (mechanical), 20560 (1-2 muscles), [id number] (3+ muscles)- Dry Needling, Patient/Family education, Balance training, Stair training, and Taping  PLAN FOR NEXT SESSION: check HEP, PERFORM DN NEXT SESSION-- have patient review handouts, sign wavier, etc. Focal HS eccentric strengthening, isometric contract/relax, work up to bridge with knee curls, standing eccentric load. Add adductor strengthening too.   Laelia Angelo, PT 09/14/2024, 3:50 PM

## 2024-09-21 ENCOUNTER — Encounter: Payer: Self-pay | Admitting: Rehabilitative and Restorative Service Providers"

## 2024-09-21 ENCOUNTER — Ambulatory Visit: Admitting: Rehabilitative and Restorative Service Providers"

## 2024-09-21 DIAGNOSIS — R2689 Other abnormalities of gait and mobility: Secondary | ICD-10-CM | POA: Diagnosis not present

## 2024-09-21 DIAGNOSIS — R262 Difficulty in walking, not elsewhere classified: Secondary | ICD-10-CM

## 2024-09-21 DIAGNOSIS — R2681 Unsteadiness on feet: Secondary | ICD-10-CM

## 2024-09-21 DIAGNOSIS — M6281 Muscle weakness (generalized): Secondary | ICD-10-CM | POA: Diagnosis not present

## 2024-09-21 DIAGNOSIS — M79651 Pain in right thigh: Secondary | ICD-10-CM | POA: Diagnosis not present

## 2024-09-21 DIAGNOSIS — S76301A Unspecified injury of muscle, fascia and tendon of the posterior muscle group at thigh level, right thigh, initial encounter: Secondary | ICD-10-CM | POA: Diagnosis not present

## 2024-09-21 NOTE — Therapy (Signed)
 OUTPATIENT PHYSICAL THERAPY LOWER EXTREMITY TREATMENT   Patient Name: Alex West MRN: [id number] DOB:1956/07/16, 68 y.o., male Today's Date: 09/21/2024  END OF SESSION:  PT End of Session - 09/21/24 1314     Visit Number 3    Number of Visits 16    Date for Recertification  11/05/24    Authorization Type [employer]    PT Start Time 1317    PT Stop Time 1400    PT Time Calculation (min) 43 min    Activity Tolerance Patient tolerated treatment well    Behavior During Therapy WFL for tasks assessed/performed          Past Medical History:  Diagnosis Date   Arthritis    BPH (benign prostatic hyperplasia)    Colon polyp    ED (erectile dysfunction)    Elevated liver enzymes    Gallstones    Hepatitis    History of anemia    Hypercholesterolemia    Hypothyroidism    Mild tricuspid regurgitation    Mitral valve regurgitation    Thyroid  disease    Vertigo    Past Surgical History:  Procedure Laterality Date   ANTERIOR CERVICAL DECOMP/DISCECTOMY FUSION N/A 07/23/2022   Procedure: Anterior Cervical Discectomy and Fusion Cervical Three-Four/Four-Five/Five-Six;  Surgeon: Debby Dorn MATSU, MD;  Location: [facility] OR;  Service: Neurosurgery;  Laterality: N/A;  3C   FLEXIBLE SIGMOIDOSCOPY     TOTAL HIP ARTHROPLASTY Left 01/06/2023   Procedure: LEFT TOTAL HIP ARTHROPLASTY ANTERIOR APPROACH;  Surgeon: Liam Lerner, MD;  Location: [facility] ORS;  Service: Orthopedics;  Laterality: Left;   Patient Active Problem List   Diagnosis Date Noted   UTI (urinary tract infection) 01/24/2023   Sepsis (HCC) 01/24/2023   Bacteremia 01/24/2023   AKI (acute kidney injury) 01/23/2023   Arthritis of left hip 01/01/2023   Paresthesia 08/21/2022   Stenosis of cervical spine with myelopathy (HCC) 07/23/2022   Gait abnormality 06/18/2022   Cervical myelopathy (HCC) 06/17/2022   Debility 06/06/2022   Neck pain 06/06/2022   OA (osteoarthritis) of hip 07/24/2021   Immunosuppressed status 09/05/2014    Hepatitis, autoimmune (HCC) 09/05/2014    PCP: Ladonna Dale Gull, MD REFERRING PROVIDER: Redell Robes, DO REFERRING DIAG: [phone] (ICD-10-CM) - Hamstring injury, right, initial encounter  THERAPY DIAG:  Pain in right thigh  Muscle weakness (generalized)  Other abnormalities of gait and mobility  Difficulty in walking, not elsewhere classified  Unsteadiness on feet  Right hamstring tendinopathy. Chronic, ongoing for 2 years. Please use dry needling.   Rationale for Evaluation and Treatment: Rehabilitation  ONSET DATE: 09/01/24  SUBJECTIVE:   SUBJECTIVE STATEMENT: The patient did not use as many patches or cream on R HS after DN-- he had reduced intensity of pain.   EVAL: The patient is s/p cervical spine surgery 07/2022. He began at that time with R hamstring pain. He was also dx with peripheral neuropathy. He reports his pain is focal to R hamstring distal to IT and above the knee. He reports neuropathy is from bilateral shoulders down into hands and legs from waist down into feet are burning. It's almost uncomfortable to keep shoes on.  He reports general weakness since his neck surgery.  He recently finished at [facility]-- he has been getting PT off and on since surgery.  He recalls an immediate sensation of walking in quick sand after neck surgery.  He feels like he is losing some strength in HS muscle. His primary goal is reducing pain. He wants  to try dry needling.  PERTINENT HISTORY: 07/2022 cervical spine surgery, 12/2022 L hip replacement.  PAIN:  Are you having pain? Yes: NPRS scale: 8/10 pain at rest it stays at an 8.  Pain location: R posterior thigh Pain description: pain and weakness Aggravating factors: mostly with walking Relieving factors: Cream, patches, a sleeve  Other pain: shoulders into arms/fingers and hips down numbness and weakness in all extremities. No neck pain  PRECAUTIONS: None  RED FLAGS: The patient reports constipation with bowels  since surgery and frequent urination with bladder, no incontinence.    WEIGHT BEARING RESTRICTIONS: No  FALLS:  Has patient fallen in last 6 months? Yes. Number of falls 1-- leg buckled  LIVING ENVIRONMENT: Lives with: lives with their spouse Lives in: House/apartment Stairs: can live on one level but has steps to home gym-- exercises daily M-F Has following equipment at home: Single point cane  OCCUPATION: retired  PLOF: Independent with numbness/tingling R arm (into last 2 fingers). Modified indep with cane since the surgery 8/23.  PATIENT GOALS: Getting rid of pain, gain strength and balance.   OBJECTIVE:  Note: Objective measures were completed at Evaluation unless otherwise noted.  DIAGNOSTIC FINDINGS:  He had severe cervical stenosis including myelomalacia and severe stenosis at C3-4, moderate-to-severe stenosis at C4-5, and severe stenosis at C5-6.   NCV with EMG 11/2022: Conclusion: This is an abnormal study.  There is electrodiagnostic evidence of chronic mild right C5-6-7 cervical radiculopathy, and also chronic right L4-5 S1 lumbar radiculopathy.  There is no evidence of large fiber peripheral neuropathy, or upper extremity focal neuropathy.   PATIENT SURVEYS:  Neuro and ortho diagnoses  COGNITION: Overall cognitive status: Within functional limits for tasks assessed     SENSATION: abnormality in sensation t/o bilat Ues and Les   PALPATION:           No specific tenderness noted with palpation today--- pain stays consistent t/o session  LOWER EXTREMITY ROM: Dec'd L hip ER as compared to R  LOWER EXTREMITY MMT: MMT Right eval Left eval  Hip flexion 5/5 5/5  Hip extension 3/5 3/5  Hip abduction 3+/5 3/5  Hip adduction    Hip internal rotation 4/5 4/5  Hip external rotation 4/5 3/5  Knee flexion 5/5 5/5  Knee extension 5/5 5/5  Ankle dorsiflexion 5/5 5/5  Ankle plantarflexion    Ankle inversion    Ankle eversion     (Blank rows = not  tested)  GAIT: Distance walked: Gait with SPC into/out of clinic with slow speed-- Cane utilized on R UE. Assistive device utilized: Single point cane Level of assistance: Modified independence                         [facility] Adult PT Treatment:                                                DATE: 09/21/24 Therapeutic Exercise: Ellipitical x 2 minutes forward, 1 minute backwards Supine Hamstring stretch strap Adductor stretch strap Prone Knee flexion with 2# weight x 2 reps Hip flexor press up for hip flexor stretch  Sidelying Hip IR reverse clam with green band x 10 reps Therapeutic Activity: Quadriped Hip extension x 8 reps R and L Bird dog x 6 reps R and L Primal push up to down dog  x 3 reps with cues for pushing into hips Demonstrated HEP: hip adductor exercise-- PT made corrections to technique Side plank with bent knees/on elbow Gait: On treadmill noting hip abductor weakness on the R   [facility] Adult PT Treatment:                                                DATE: 09/14/24 Therapeutic Exercise: Standing Hip adductor stretch Supine Hamstring stretch and adductor stretch with stretch out strap x 2 reps R and L sides with prolonged holding Manual Therapy: STM in prone on R hamstring (medial portion) STM in supine on R adductor brevis and adductor magnus  Dry Needling: Trigger Point Dry Needling Initial Treatment: Pt instructed on Dry Needling rational, procedures, and possible side effects. Pt instructed to expect mild to moderate muscle soreness later in the day and/or into the next day.  Pt instructed in methods to reduce muscle soreness. Pt instructed to continue prescribed HEP. Patient verbalized understanding of these instructions and education.  Patient Verbal Consent Given: Yes Education Handout Provided: Yes Muscles Treated: hip adductor and hamstring musculature R leg x 4 needles Electrical Stimulation Performed: No Treatment Response/Outcome: palpable  lengthening, twitch response                                                                                                        [facility] Adult PT Treatment:                                                DATE: 09/06/24 Therapeutic Exercise: Prone  Hip extension x 10 reps bilat Knee flexion  x 10 reps bilat Sidelying Hip abduction x 10 reps bilat Supine Bridging adding ankle DF, the attempting physioball bridge + knee curls bilat== unable to tolerate Manual Therapy: Self mobilization and release of HS with massage ball and hip IR/ER  PATIENT EDUCATION:  Education details: HEP, dry needling eduction (need to provide handout), PT plan of care Person educated: Patient Education method: Explanation, Demonstration, and Handouts Education comprehension: verbalized understanding and returned demonstration  HOME EXERCISE PROGRAM: Access Code: [email or web address] URL: [email or web address] Date: 09/14/2024 Prepared by: Tawni Ferrier  Exercises - Sidelying Hip Abduction  - 1 x daily - 4 x weekly - 1 sets - 10 reps - Prone Hip Extension  - 1 x daily - 4 x weekly - 1 sets - 10 reps - Prone Knee Flexion AROM  - 1 x daily - 4 x weekly - 1 sets - 10 reps - Calf Mobilization with Small Ball  - 1 x daily - 4 x weekly - 1 sets - 1 reps - 2 minutes hold - Hip Adductors and Hamstring Stretch with Strap  - 1 x daily - 5 x weekly - 1 sets - 2 reps - 30 seconds  hold - Lateral Lunge Adductor Stretch with Counter Support  - 1 x daily - 5 x weekly - 1 sets - 2 reps - 30 seconds hold  ASSESSMENT: CLINICAL IMPRESSION: The patient appears to have responded well to initial round of dry needling. His pain continues in HS (not adductor) , but is less intense this week. PT added further exercise and will work to continue isolating motor control + DN and STM as indicated. PT to continue to progress by adding adductor strengthening and hamstring progression.  EVAL: Patient is a 68 y.o. male who was seen  today for physical therapy evaluation and treatment for R hamstring tendinopathy. His symptoms began s/p cervical decompression surgery in 07/2022. He has been receiving therapy intermittently since that time. He presents today with bilateral hip weakness, R HS weakness, pain that is constant at an 8/10. He does not report low back pain. He does report a sensation of general weakness in Les and changes to sensation in bilat Ues and Les. PT and patient discussed long h/o therapy and what his true goals for care are. He is most interested in managing HS pain. PT also did a thorough review of his medical record. He did have NCV in 12/23, which showed some evidence of R lumbar radiculopathy. PT to further assess response to interventions to determine effectiveness of treatment. We also discussed more focal HS and glut strength. He has avoided some of this due to concern of flaring pain (however pain did not change in session today).   OBJECTIVE IMPAIRMENTS: Abnormal gait, decreased ROM, decreased strength, increased fascial restrictions, impaired sensation, and pain.   GOALS: Goals reviewed with patient? Yes  SHORT TERM GOALS: Target date: 10/05/24  The patient will be indep with initial HEP Baseline: initiated at eval Goal status: INITIAL  2.  The patient will report reduced R HS pain to < or equal to 6/10.  Baseline:  8/10, constant Goal status: INITIAL  3.  The patient will improve bilat hip abduction strength to > or equal to 4/5. Baseline:  See above Goal status: INITIAL  LONG TERM GOALS: Target date: 11/05/24  The patient will be indep with progression of HEP. Baseline:  initiated at eval Goal status: INITIAL  2.  The patient will report pain < or equal to 4/10 at rest. Baseline: 8/10 Goal status: INITIAL  3.  The patient will verbalize strategies to reduce pain to manage chronic deficits. Baseline:  will instruct in self massage/ self release, and post d/c activities for pain  mgmt. Goal status: INITIAL  PLAN:  PT FREQUENCY: 1-2x/week  PT DURATION: 8 weeks  PLANNED INTERVENTIONS: [id number]- PT Re-evaluation, 97110-Therapeutic exercises, 97530- Therapeutic activity, [id number]- Neuromuscular re-education, 97535- Self Care, [id number]- Manual therapy, [id number]- Gait training, [phone]- Electrical stimulation (unattended), [phone]- Electrical stimulation (manual), [id number]- Traction (mechanical), 20560 (1-2 muscles), [id number] (3+ muscles)- Dry Needling, Patient/Family education, Balance training, Stair training, and Taping  PLAN FOR NEXT SESSION: check HEP, PERFORM DN NEXT SESSION-- have patient review handouts, sign wavier, etc. Focal HS eccentric strengthening, isometric contract/relax, work up to bridge with knee curls, standing eccentric load. Add adductor strengthening too.  DRY NEEDLING NEXT VISIT- progress hip abduction strengthening.    Laylonie Marzec, PT 09/21/2024, 3:54 PM

## 2024-09-28 ENCOUNTER — Ambulatory Visit: Admitting: Rehabilitative and Restorative Service Providers"

## 2024-09-28 DIAGNOSIS — S76301A Unspecified injury of muscle, fascia and tendon of the posterior muscle group at thigh level, right thigh, initial encounter: Secondary | ICD-10-CM | POA: Insufficient documentation

## 2024-09-28 DIAGNOSIS — Y929 Unspecified place or not applicable: Secondary | ICD-10-CM | POA: Insufficient documentation

## 2024-09-28 DIAGNOSIS — M6281 Muscle weakness (generalized): Secondary | ICD-10-CM | POA: Diagnosis present

## 2024-09-28 DIAGNOSIS — R262 Difficulty in walking, not elsewhere classified: Secondary | ICD-10-CM | POA: Diagnosis not present

## 2024-09-28 DIAGNOSIS — R2689 Other abnormalities of gait and mobility: Secondary | ICD-10-CM | POA: Diagnosis not present

## 2024-09-28 DIAGNOSIS — Y939 Activity, unspecified: Secondary | ICD-10-CM | POA: Diagnosis not present

## 2024-09-28 DIAGNOSIS — R2681 Unsteadiness on feet: Secondary | ICD-10-CM | POA: Insufficient documentation

## 2024-09-28 DIAGNOSIS — M79651 Pain in right thigh: Secondary | ICD-10-CM | POA: Diagnosis not present

## 2024-09-28 NOTE — Therapy (Signed)
 OUTPATIENT PHYSICAL THERAPY LOWER EXTREMITY TREATMENT   Patient Name: Alex West MRN: [id number] DOB:May 28, 1956, 68 y.o., male Today's Date: 09/28/2024  END OF SESSION:  PT End of Session - 09/28/24 1314     Visit Number 4    Number of Visits 16    Date for Recertification  11/05/24    Authorization Type [employer]    PT Start Time 1316    PT Stop Time 1400    PT Time Calculation (min) 44 min    Activity Tolerance Patient tolerated treatment well    Behavior During Therapy WFL for tasks assessed/performed          Past Medical History:  Diagnosis Date   Arthritis    BPH (benign prostatic hyperplasia)    Colon polyp    ED (erectile dysfunction)    Elevated liver enzymes    Gallstones    Hepatitis    History of anemia    Hypercholesterolemia    Hypothyroidism    Mild tricuspid regurgitation    Mitral valve regurgitation    Thyroid  disease    Vertigo    Past Surgical History:  Procedure Laterality Date   ANTERIOR CERVICAL DECOMP/DISCECTOMY FUSION N/A 07/23/2022   Procedure: Anterior Cervical Discectomy and Fusion Cervical Three-Four/Four-Five/Five-Six;  Surgeon: Debby Dorn MATSU, MD;  Location: [facility] OR;  Service: Neurosurgery;  Laterality: N/A;  3C   FLEXIBLE SIGMOIDOSCOPY     TOTAL HIP ARTHROPLASTY Left 01/06/2023   Procedure: LEFT TOTAL HIP ARTHROPLASTY ANTERIOR APPROACH;  Surgeon: Liam Lerner, MD;  Location: [facility] ORS;  Service: Orthopedics;  Laterality: Left;   Patient Active Problem List   Diagnosis Date Noted   UTI (urinary tract infection) 01/24/2023   Sepsis (HCC) 01/24/2023   Bacteremia 01/24/2023   AKI (acute kidney injury) 01/23/2023   Arthritis of left hip 01/01/2023   Paresthesia 08/21/2022   Stenosis of cervical spine with myelopathy (HCC) 07/23/2022   Gait abnormality 06/18/2022   Cervical myelopathy (HCC) 06/17/2022   Debility 06/06/2022   Neck pain 06/06/2022   OA (osteoarthritis) of hip 07/24/2021   Immunosuppressed status 09/05/2014    Hepatitis, autoimmune (HCC) 09/05/2014    PCP: Ladonna Dale Gull, MD REFERRING PROVIDER: Redell Robes, DO REFERRING DIAG: [phone] (ICD-10-CM) - Hamstring injury, right, initial encounter  THERAPY DIAG:  Pain in right thigh  Muscle weakness (generalized)  Other abnormalities of gait and mobility  Difficulty in walking, not elsewhere classified  Right hamstring tendinopathy. Chronic, ongoing for 2 years. Please use dry needling.   Rationale for Evaluation and Treatment: Rehabilitation  ONSET DATE: 09/01/24  SUBJECTIVE:   SUBJECTIVE STATEMENT: The patient still has some relief from initial dry needling. We discussed and agreed to DN today.  EVAL: The patient is s/p cervical spine surgery 07/2022. He began at that time with R hamstring pain. He was also dx with peripheral neuropathy. He reports his pain is focal to R hamstring distal to IT and above the knee. He reports neuropathy is from bilateral shoulders down into hands and legs from waist down into feet are burning. It's almost uncomfortable to keep shoes on.  He reports general weakness since his neck surgery.  He recently finished at [facility]-- he has been getting PT off and on since surgery.  He recalls an immediate sensation of walking in quick sand after neck surgery.  He feels like he is losing some strength in HS muscle. His primary goal is reducing pain. He wants to try dry needling.  PERTINENT HISTORY: 07/2022 cervical  spine surgery, 12/2022 L hip replacement.  PAIN:  Are you having pain? Yes: NPRS scale: 8/10 pain at rest it stays at an 8.  Pain location: R posterior thigh Pain description: pain and weakness Aggravating factors: mostly with walking Relieving factors: Cream, patches, a sleeve  Other pain: shoulders into arms/fingers and hips down numbness and weakness in all extremities. No neck pain  PRECAUTIONS: None  RED FLAGS: The patient reports constipation with bowels since surgery and frequent  urination with bladder, no incontinence.    WEIGHT BEARING RESTRICTIONS: No  FALLS:  Has patient fallen in last 6 months? Yes. Number of falls 1-- leg buckled  LIVING ENVIRONMENT: Lives with: lives with their spouse Lives in: House/apartment Stairs: can live on one level but has steps to home gym-- exercises daily M-F Has following equipment at home: Single point cane  OCCUPATION: retired  PLOF: Independent with numbness/tingling R arm (into last 2 fingers). Modified indep with cane since the surgery 8/23.  PATIENT GOALS: Getting rid of pain, gain strength and balance.   OBJECTIVE:  Note: Objective measures were completed at Evaluation unless otherwise noted.  DIAGNOSTIC FINDINGS:  He had severe cervical stenosis including myelomalacia and severe stenosis at C3-4, moderate-to-severe stenosis at C4-5, and severe stenosis at C5-6.   NCV with EMG 11/2022: Conclusion: This is an abnormal study.  There is electrodiagnostic evidence of chronic mild right C5-6-7 cervical radiculopathy, and also chronic right L4-5 S1 lumbar radiculopathy.  There is no evidence of large fiber peripheral neuropathy, or upper extremity focal neuropathy.   PATIENT SURVEYS:  Neuro and ortho diagnoses  COGNITION: Overall cognitive status: Within functional limits for tasks assessed     SENSATION: abnormality in sensation t/o bilat Ues and Les   PALPATION:           No specific tenderness noted with palpation today--- pain stays consistent t/o session  LOWER EXTREMITY ROM: Dec'd L hip ER as compared to R  LOWER EXTREMITY MMT: MMT Right eval Left eval  Hip flexion 5/5 5/5  Hip extension 3/5 3/5  Hip abduction 3+/5 3/5  Hip adduction    Hip internal rotation 4/5 4/5  Hip external rotation 4/5 3/5  Knee flexion 5/5 5/5  Knee extension 5/5 5/5  Ankle dorsiflexion 5/5 5/5  Ankle plantarflexion    Ankle inversion    Ankle eversion     (Blank rows = not tested)  GAIT: Distance walked: Gait with  SPC into/out of clinic with slow speed-- Cane utilized on R UE. Assistive device utilized: Single point cane Level of assistance: Modified independence           [facility] Adult PT Treatment:                                                DATE: 09/28/24 Therapeutic Exercise: Seated Seated heel slide (internally rotates L leg) Seated hip ER-- attempting stretch turned to the side with leg on mat Supine Stretch out strap HS x 2 reps Stretch out strap with adductor stretch + medial HS  Manual Therapy: STM R hamstring and R hip adductors Gait: Gait without device-- note L hip weakness and L foot moving into pronation leading to knee valgus-- we discussed strengthening the L LE in order to improve gait Dry Needling: Trigger Point Dry Needling Treatment: Pre-treatment instruction: Patient instructed on dry needling rationale,  procedures, and possible side effects including pain during treatment (achy,cramping feeling), bruising, drop of blood, lightheadedness, nausea, sweating. Patient Consent Given: Yes Education handout provided: Yes Muscles treated: R medial HS and R adductor  Treatment response/outcome: Palpable decrease in muscle tension Post-treatment instructions: Patient instructed to expect possible mild to moderate muscle soreness later today and/or tomorrow. Patient instructed in methods to reduce muscle soreness and to continue prescribed HEP. If patient was dry needled over the lung field, patient was instructed on signs and symptoms of pneumothorax and, however unlikely, to see immediate medical attention should they occur. Patient was also educated on signs and symptoms of infection and to seek medical attention should they occur. Patient verbalized understanding of these instructions and education.                 [facility] Adult PT Treatment:                                                DATE: 09/21/24 Therapeutic Exercise: Ellipitical x 2 minutes forward, 1 minute  backwards Supine Hamstring stretch strap Adductor stretch strap Prone Knee flexion with 2# weight x 2 reps Hip flexor press up for hip flexor stretch  Sidelying Hip IR reverse clam with green band x 10 reps Therapeutic Activity: Quadriped Hip extension x 8 reps R and L Bird dog x 6 reps R and L Primal push up to down dog x 3 reps with cues for pushing into hips Demonstrated HEP: hip adductor exercise-- PT made corrections to technique Side plank with bent knees/on elbow Gait: On treadmill noting hip abductor weakness on the R   [facility] Adult PT Treatment:                                                DATE: 09/14/24 Therapeutic Exercise: Standing Hip adductor stretch Supine Hamstring stretch and adductor stretch with stretch out strap x 2 reps R and L sides with prolonged holding Manual Therapy: STM in prone on R hamstring (medial portion) STM in supine on R adductor brevis and adductor magnus  Dry Needling: Trigger Point Dry Needling Initial Treatment: Pt instructed on Dry Needling rational, procedures, and possible side effects. Pt instructed to expect mild to moderate muscle soreness later in the day and/or into the next day.  Pt instructed in methods to reduce muscle soreness. Pt instructed to continue prescribed HEP. Patient verbalized understanding of these instructions and education.  Patient Verbal Consent Given: Yes Education Handout Provided: Yes Muscles Treated: hip adductor and hamstring musculature R leg x 4 needles Electrical Stimulation Performed: No Treatment Response/Outcome: palpable lengthening, twitch response                                                                                                        [facility]  Adult PT Treatment:                                                DATE: 09/06/24 Therapeutic Exercise: Prone  Hip extension x 10 reps bilat Knee flexion  x 10 reps bilat Sidelying Hip abduction x 10 reps bilat Supine Bridging adding  ankle DF, the attempting physioball bridge + knee curls bilat== unable to tolerate Manual Therapy: Self mobilization and release of HS with massage ball and hip IR/ER  PATIENT EDUCATION:  Education details: HEP, dry needling eduction (need to provide handout), PT plan of care Person educated: Patient Education method: Explanation, Demonstration, and Handouts Education comprehension: verbalized understanding and returned demonstration  HOME EXERCISE PROGRAM: Access Code: [email or web address] URL: [email or web address] Date: 09/14/2024 Prepared by: Tawni Ferrier  Exercises - Sidelying Hip Abduction  - 1 x daily - 4 x weekly - 1 sets - 10 reps - Prone Hip Extension  - 1 x daily - 4 x weekly - 1 sets - 10 reps - Prone Knee Flexion AROM  - 1 x daily - 4 x weekly - 1 sets - 10 reps - Calf Mobilization with Small Ball  - 1 x daily - 4 x weekly - 1 sets - 1 reps - 2 minutes hold - Hip Adductors and Hamstring Stretch with Strap  - 1 x daily - 5 x weekly - 1 sets - 2 reps - 30 seconds hold - Lateral Lunge Adductor Stretch with Counter Support  - 1 x daily - 5 x weekly - 1 sets - 2 reps - 30 seconds hold  ASSESSMENT: CLINICAL IMPRESSION: The patient got initial relief from dry needling, therefore, PT performed today on R HS. We also worked on IASTM/STM of R hamstring musculature. Patient performed stretching and then activation of ER of the L hip. PT to continue to progress adding further adductor strengthening and hamstring progression.  EVAL: Patient is a 68 y.o. male who was seen today for physical therapy evaluation and treatment for R hamstring tendinopathy. His symptoms began s/p cervical decompression surgery in 07/2022. He has been receiving therapy intermittently since that time. He presents today with bilateral hip weakness, R HS weakness, pain that is constant at an 8/10. He does not report low back pain. He does report a sensation of general weakness in Les and changes to sensation  in bilat Ues and Les. PT and patient discussed long h/o therapy and what his true goals for care are. He is most interested in managing HS pain. PT also did a thorough review of his medical record. He did have NCV in 12/23, which showed some evidence of R lumbar radiculopathy. PT to further assess response to interventions to determine effectiveness of treatment. We also discussed more focal HS and glut strength. He has avoided some of this due to concern of flaring pain (however pain did not change in session today).   OBJECTIVE IMPAIRMENTS: Abnormal gait, decreased ROM, decreased strength, increased fascial restrictions, impaired sensation, and pain.   GOALS: Goals reviewed with patient? Yes  SHORT TERM GOALS: Target date: 10/05/24  The patient will be indep with initial HEP Baseline: initiated at eval Goal status: INITIAL  2.  The patient will report reduced R HS pain to < or equal to 6/10.  Baseline:  8/10, constant Goal status: MET on 09/28/24-- 5/10  3.  The patient  will improve bilat hip abduction strength to > or equal to 4/5. Baseline:  See above Goal status: INITIAL  LONG TERM GOALS: Target date: 11/05/24  The patient will be indep with progression of HEP. Baseline:  initiated at eval Goal status: INITIAL  2.  The patient will report pain < or equal to 4/10 at rest. Baseline: 8/10 Goal status: INITIAL  3.  The patient will verbalize strategies to reduce pain to manage chronic deficits. Baseline:  will instruct in self massage/ self release, and post d/c activities for pain mgmt. Goal status: INITIAL  PLAN:  PT FREQUENCY: 1-2x/week  PT DURATION: 8 weeks  PLANNED INTERVENTIONS: [id number]- PT Re-evaluation, 97110-Therapeutic exercises, 97530- Therapeutic activity, [id number]- Neuromuscular re-education, 97535- Self Care, [id number]- Manual therapy, [id number]- Gait training, [phone]- Electrical stimulation (unattended), [phone]- Electrical stimulation (manual), [id number]- Traction (mechanical),  20560 (1-2 muscles), 20561 (3+ muscles)- Dry Needling, Patient/Family education, Balance training, Stair training, and Taping  PLAN FOR NEXT SESSION: check HEP, Focal HS eccentric strengthening, isometric contract/relax, work up to bridge with knee curls, standing eccentric load. Add adductor strengthening too.  DRY NEEDLING NEXT VISIT- progress hip abduction strengthening.  Check goals next week   Elma Shands, PT 09/28/2024, 1:15 PM

## 2024-10-05 ENCOUNTER — Encounter: Payer: Self-pay | Admitting: Rehabilitative and Restorative Service Providers"

## 2024-10-05 ENCOUNTER — Ambulatory Visit: Admitting: Rehabilitative and Restorative Service Providers"

## 2024-10-05 DIAGNOSIS — R2689 Other abnormalities of gait and mobility: Secondary | ICD-10-CM

## 2024-10-05 DIAGNOSIS — M79651 Pain in right thigh: Secondary | ICD-10-CM

## 2024-10-05 DIAGNOSIS — M6281 Muscle weakness (generalized): Secondary | ICD-10-CM

## 2024-10-05 NOTE — Therapy (Signed)
 OUTPATIENT PHYSICAL THERAPY LOWER EXTREMITY TREATMENT   Patient Name: Alex West MRN: [id number] DOB:07/01/1956, 68 y.o., male Today's Date: 10/05/2024  END OF SESSION:  PT End of Session - 10/05/24 1104     Visit Number 5    Number of Visits 16    Date for Recertification  11/05/24    Authorization Type [employer]    PT Start Time 1104    PT Stop Time 1145    PT Time Calculation (min) 41 min    Activity Tolerance Patient tolerated treatment well    Behavior During Therapy WFL for tasks assessed/performed           Past Medical History:  Diagnosis Date   Arthritis    BPH (benign prostatic hyperplasia)    Colon polyp    ED (erectile dysfunction)    Elevated liver enzymes    Gallstones    Hepatitis    History of anemia    Hypercholesterolemia    Hypothyroidism    Mild tricuspid regurgitation    Mitral valve regurgitation    Thyroid  disease    Vertigo    Past Surgical History:  Procedure Laterality Date   ANTERIOR CERVICAL DECOMP/DISCECTOMY FUSION N/A 07/23/2022   Procedure: Anterior Cervical Discectomy and Fusion Cervical Three-Four/Four-Five/Five-Six;  Surgeon: Debby Dorn MATSU, MD;  Location: [facility] OR;  Service: Neurosurgery;  Laterality: N/A;  3C   FLEXIBLE SIGMOIDOSCOPY     TOTAL HIP ARTHROPLASTY Left 01/06/2023   Procedure: LEFT TOTAL HIP ARTHROPLASTY ANTERIOR APPROACH;  Surgeon: Liam Lerner, MD;  Location: [facility] ORS;  Service: Orthopedics;  Laterality: Left;   Patient Active Problem List   Diagnosis Date Noted   UTI (urinary tract infection) 01/24/2023   Sepsis (HCC) 01/24/2023   Bacteremia 01/24/2023   AKI (acute kidney injury) 01/23/2023   Arthritis of left hip 01/01/2023   Paresthesia 08/21/2022   Stenosis of cervical spine with myelopathy (HCC) 07/23/2022   Gait abnormality 06/18/2022   Cervical myelopathy (HCC) 06/17/2022   Debility 06/06/2022   Neck pain 06/06/2022   OA (osteoarthritis) of hip 07/24/2021   Immunosuppressed status 09/05/2014    Hepatitis, autoimmune (HCC) 09/05/2014    PCP: Ladonna Dale Gull, MD REFERRING PROVIDER: Redell Robes, DO REFERRING DIAG: [phone] (ICD-10-CM) - Hamstring injury, right, initial encounter  THERAPY DIAG:  Pain in right thigh  Muscle weakness (generalized)  Other abnormalities of gait and mobility  Right hamstring tendinopathy. Chronic, ongoing for 2 years. Please use dry needling.   Rationale for Evaluation and Treatment: Rehabilitation  ONSET DATE: 09/01/24  SUBJECTIVE:   SUBJECTIVE STATEMENT: The patient notes that he got sore in the R hamstrings on Sunday and has gone back to using cream/patch for pain control.   EVAL: The patient is s/p cervical spine surgery 07/2022. He began at that time with R hamstring pain. He was also dx with peripheral neuropathy. He reports his pain is focal to R hamstring distal to IT and above the knee. He reports neuropathy is from bilateral shoulders down into hands and legs from waist down into feet are burning. It's almost uncomfortable to keep shoes on.  He reports general weakness since his neck surgery.  He recently finished at [facility]-- he has been getting PT off and on since surgery.  He recalls an immediate sensation of walking in quick sand after neck surgery.  He feels like he is losing some strength in HS muscle. His primary goal is reducing pain. He wants to try dry needling.  PERTINENT HISTORY: 07/2022  cervical spine surgery, 12/2022 L hip replacement.  PAIN:  Are you having pain? Yes: NPRS scale: 3/10, on Sunday it was an 8/10 Pain location: R posterior thigh Pain description: pain and weakness Aggravating factors: mostly with walking Relieving factors: Cream, patches, a sleeve  Other pain: shoulders into arms/fingers and hips down numbness and weakness in all extremities. No neck pain  PRECAUTIONS: None  RED FLAGS: The patient reports constipation with bowels since surgery and frequent urination with bladder, no  incontinence.    WEIGHT BEARING RESTRICTIONS: No  FALLS:  Has patient fallen in last 6 months? Yes. Number of falls 1-- leg buckled  LIVING ENVIRONMENT: Lives with: lives with their spouse Lives in: House/apartment Stairs: can live on one level but has steps to home gym-- exercises daily M-F Has following equipment at home: Single point cane  OCCUPATION: retired  PLOF: Independent with numbness/tingling R arm (into last 2 fingers). Modified indep with cane since the surgery 8/23.  PATIENT GOALS: Getting rid of pain, gain strength and balance.   OBJECTIVE:  Note: Objective measures were completed at Evaluation unless otherwise noted.  DIAGNOSTIC FINDINGS:  He had severe cervical stenosis including myelomalacia and severe stenosis at C3-4, moderate-to-severe stenosis at C4-5, and severe stenosis at C5-6.   NCV with EMG 11/2022: Conclusion: This is an abnormal study.  There is electrodiagnostic evidence of chronic mild right C5-6-7 cervical radiculopathy, and also chronic right L4-5 S1 lumbar radiculopathy.  There is no evidence of large fiber peripheral neuropathy, or upper extremity focal neuropathy.   PATIENT SURVEYS:  Neuro and ortho diagnoses  COGNITION: Overall cognitive status: Within functional limits for tasks assessed     SENSATION: abnormality in sensation t/o bilat Ues and Les   PALPATION:           No specific tenderness noted with palpation today--- pain stays consistent t/o session  LOWER EXTREMITY ROM: Dec'd L hip ER as compared to R  LOWER EXTREMITY MMT: MMT Right eval Left eval  Hip flexion 5/5 5/5  Hip extension 3/5 3/5  Hip abduction 3+/5 3/5  Hip adduction    Hip internal rotation 4/5 4/5  Hip external rotation 4/5 3/5  Knee flexion 5/5 5/5  Knee extension 5/5 5/5  Ankle dorsiflexion 5/5 5/5  Ankle plantarflexion    Ankle inversion    Ankle eversion     (Blank rows = not tested)  GAIT: Distance walked: Gait with SPC into/out of clinic  with slow speed-- Cane utilized on R UE. Assistive device utilized: Single point cane Level of assistance: Modified independence          [facility] Adult PT Treatment:                                                DATE: 10/05/24 Manual Therapy: STM R hamstring and IASTM STM R adductors Dry needling: .Trigger Point Dry Needling Treatment: Pre-treatment instruction: Patient instructed on dry needling rationale, procedures, and possible side effects including pain during treatment (achy,cramping feeling), bruising, drop of blood, lightheadedness, nausea, sweating. Patient Consent Given: Yes Education handout provided: Previously provided Muscles treated: R hamstrings x 2 needles  Treatment response/outcome: Palpable decrease in muscle tension Post-treatment instructions: Patient instructed to expect possible mild to moderate muscle soreness later today and/or tomorrow. Patient instructed in methods to reduce muscle soreness and to continue prescribed HEP. If  patient was dry needled over the lung field, patient was instructed on signs and symptoms of pneumothorax and, however unlikely, to see immediate medical attention should they occur. Patient was also educated on signs and symptoms of infection and to seek medical attention should they occur. Patient verbalized understanding of these instructions and education. Therapeutic Activity: Quadriped <> down dog Quadriped hip extension + adding knee flexion Lateral step ups adding R hip abduction x 10 reps with L UE support, then repeat on R side lateral step up Single leg dead lift in stages (wall reaches, then reaching to mat, then using railing on treadmill) -- more challenging on L side  [facility] Adult PT Treatment:                                                DATE: 09/28/24 Therapeutic Exercise: Seated Seated heel slide (internally rotates L leg) Seated hip ER-- attempting stretch turned to the side with leg on mat Supine Stretch out strap HS x 2  reps Stretch out strap with adductor stretch + medial HS  Manual Therapy: STM R hamstring and R hip adductors Gait: Gait without device-- note L hip weakness and L foot moving into pronation leading to knee valgus-- we discussed strengthening the L LE in order to improve gait Dry Needling: Trigger Point Dry Needling Treatment: Pre-treatment instruction: Patient instructed on dry needling rationale, procedures, and possible side effects including pain during treatment (achy,cramping feeling), bruising, drop of blood, lightheadedness, nausea, sweating. Patient Consent Given: Yes Education handout provided: Yes Muscles treated: R medial HS and R adductor  Treatment response/outcome: Palpable decrease in muscle tension Post-treatment instructions: Patient instructed to expect possible mild to moderate muscle soreness later today and/or tomorrow. Patient instructed in methods to reduce muscle soreness and to continue prescribed HEP. If patient was dry needled over the lung field, patient was instructed on signs and symptoms of pneumothorax and, however unlikely, to see immediate medical attention should they occur. Patient was also educated on signs and symptoms of infection and to seek medical attention should they occur. Patient verbalized understanding of these instructions and education.                 [facility] Adult PT Treatment:                                                DATE: 09/21/24 Therapeutic Exercise: Ellipitical x 2 minutes forward, 1 minute backwards Supine Hamstring stretch strap Adductor stretch strap Prone Knee flexion with 2# weight x 2 reps Hip flexor press up for hip flexor stretch  Sidelying Hip IR reverse clam with green band x 10 reps Therapeutic Activity: Quadriped Hip extension x 8 reps R and L Bird dog x 6 reps R and L Primal push up to down dog x 3 reps with cues for pushing into hips Demonstrated HEP: hip adductor exercise-- PT made corrections to technique Side  plank with bent knees/on elbow Gait: On treadmill noting hip abductor weakness on the R   [facility] Adult PT Treatment:  DATE: 09/14/24 Therapeutic Exercise: Standing Hip adductor stretch Supine Hamstring stretch and adductor stretch with stretch out strap x 2 reps R and L sides with prolonged holding Manual Therapy: STM in prone on R hamstring (medial portion) STM in supine on R adductor brevis and adductor magnus  Dry Needling: Trigger Point Dry Needling Initial Treatment: Pt instructed on Dry Needling rational, procedures, and possible side effects. Pt instructed to expect mild to moderate muscle soreness later in the day and/or into the next day.  Pt instructed in methods to reduce muscle soreness. Pt instructed to continue prescribed HEP. Patient verbalized understanding of these instructions and education.  Patient Verbal Consent Given: Yes Education Handout Provided: Yes Muscles Treated: hip adductor and hamstring musculature R leg x 4 needles Electrical Stimulation Performed: No Treatment Response/Outcome: palpable lengthening, twitch response                                                                                                        [facility] Adult PT Treatment:                                                DATE: 09/06/24 Therapeutic Exercise: Prone  Hip extension x 10 reps bilat Knee flexion  x 10 reps bilat Sidelying Hip abduction x 10 reps bilat Supine Bridging adding ankle DF, the attempting physioball bridge + knee curls bilat== unable to tolerate Manual Therapy: Self mobilization and release of HS with massage ball and hip IR/ER  PATIENT EDUCATION:  Education details: HEP, dry needling eduction (need to provide handout), PT plan of care Person educated: Patient Education method: Explanation, Demonstration, and Handouts Education comprehension: verbalized understanding and returned demonstration  HOME EXERCISE  PROGRAM: Access Code: [email or web address] URL: [email or web address] Date: 09/14/2024 Prepared by: Tawni Ferrier  Exercises - Sidelying Hip Abduction  - 1 x daily - 4 x weekly - 1 sets - 10 reps - Prone Hip Extension  - 1 x daily - 4 x weekly - 1 sets - 10 reps - Prone Knee Flexion AROM  - 1 x daily - 4 x weekly - 1 sets - 10 reps - Calf Mobilization with Small Ball  - 1 x daily - 4 x weekly - 1 sets - 1 reps - 2 minutes hold - Hip Adductors and Hamstring Stretch with Strap  - 1 x daily - 5 x weekly - 1 sets - 2 reps - 30 seconds hold - Lateral Lunge Adductor Stretch with Counter Support  - 1 x daily - 5 x weekly - 1 sets - 2 reps - 30 seconds hold  ASSESSMENT: CLINICAL IMPRESSION: The patient had increased pain over weekend, but it is improved today. We proceeded with DN and progressed strengthening in single limb stance for hip stabilization.   EVAL: Patient is a 68 y.o. male who was seen today for physical therapy evaluation and treatment for R hamstring tendinopathy. His symptoms began s/p cervical decompression surgery  in 07/2022. He has been receiving therapy intermittently since that time. He presents today with bilateral hip weakness, R HS weakness, pain that is constant at an 8/10. He does not report low back pain. He does report a sensation of general weakness in Les and changes to sensation in bilat Ues and Les. PT and patient discussed long h/o therapy and what his true goals for care are. He is most interested in managing HS pain. PT also did a thorough review of his medical record. He did have NCV in 12/23, which showed some evidence of R lumbar radiculopathy. PT to further assess response to interventions to determine effectiveness of treatment. We also discussed more focal HS and glut strength. He has avoided some of this due to concern of flaring pain (however pain did not change in session today).   OBJECTIVE IMPAIRMENTS: Abnormal gait, decreased ROM, decreased strength,  increased fascial restrictions, impaired sensation, and pain.   GOALS: Goals reviewed with patient? Yes  SHORT TERM GOALS: Target date: 10/05/24  The patient will be indep with initial HEP Baseline: initiated at eval Goal status: MET  2.  The patient will report reduced R HS pain to < or equal to 6/10.  Baseline:  8/10, constant Goal status: MET on 09/28/24-- 5/10  3.  The patient will improve bilat hip abduction strength to > or equal to 4/5. Baseline:  See above Goal status: INITIAL  LONG TERM GOALS: Target date: 11/05/24  The patient will be indep with progression of HEP. Baseline:  initiated at eval Goal status: INITIAL  2.  The patient will report pain < or equal to 4/10 at rest. Baseline: 8/10 Goal status: INITIAL  3.  The patient will verbalize strategies to reduce pain to manage chronic deficits. Baseline:  will instruct in self massage/ self release, and post d/c activities for pain mgmt. Goal status: INITIAL  PLAN:  PT FREQUENCY: 1-2x/week  PT DURATION: 8 weeks  PLANNED INTERVENTIONS: [id number]- PT Re-evaluation, 97110-Therapeutic exercises, 97530- Therapeutic activity, [id number]- Neuromuscular re-education, 97535- Self Care, [id number]- Manual therapy, [id number]- Gait training, [phone]- Electrical stimulation (unattended), [phone]- Electrical stimulation (manual), [id number]- Traction (mechanical), 20560 (1-2 muscles), 20561 (3+ muscles)- Dry Needling, Patient/Family education, Balance training, Stair training, and Taping  PLAN FOR NEXT SESSION: check HEP, Focal HS eccentric strengthening, isometric contract/relax, work up to bridge with knee curls, standing eccentric load. Add adductor strengthening too.  DRY NEEDLING NEXT VISIT- progress hip abduction strengthening.   TEST MMT STG next visit   Trenesha Alcaide, PT 10/05/2024, 11:04 AM

## 2024-10-07 ENCOUNTER — Other Ambulatory Visit (HOSPITAL_BASED_OUTPATIENT_CLINIC_OR_DEPARTMENT_OTHER): Payer: Self-pay

## 2024-10-12 ENCOUNTER — Encounter: Payer: Self-pay | Admitting: Physical Medicine and Rehabilitation

## 2024-10-12 ENCOUNTER — Encounter: Payer: Self-pay | Admitting: Rehabilitative and Restorative Service Providers"

## 2024-10-12 ENCOUNTER — Ambulatory Visit: Admitting: Rehabilitative and Restorative Service Providers"

## 2024-10-12 DIAGNOSIS — Y939 Activity, unspecified: Secondary | ICD-10-CM | POA: Diagnosis not present

## 2024-10-12 DIAGNOSIS — R2689 Other abnormalities of gait and mobility: Secondary | ICD-10-CM | POA: Diagnosis not present

## 2024-10-12 DIAGNOSIS — R262 Difficulty in walking, not elsewhere classified: Secondary | ICD-10-CM | POA: Diagnosis not present

## 2024-10-12 DIAGNOSIS — M6281 Muscle weakness (generalized): Secondary | ICD-10-CM | POA: Diagnosis not present

## 2024-10-12 DIAGNOSIS — S76301A Unspecified injury of muscle, fascia and tendon of the posterior muscle group at thigh level, right thigh, initial encounter: Secondary | ICD-10-CM | POA: Diagnosis not present

## 2024-10-12 DIAGNOSIS — R2681 Unsteadiness on feet: Secondary | ICD-10-CM

## 2024-10-12 DIAGNOSIS — M79651 Pain in right thigh: Secondary | ICD-10-CM

## 2024-10-12 DIAGNOSIS — Y929 Unspecified place or not applicable: Secondary | ICD-10-CM | POA: Diagnosis not present

## 2024-10-12 NOTE — Therapy (Unsigned)
 OUTPATIENT PHYSICAL THERAPY LOWER EXTREMITY TREATMENT   Patient Name: Alex West MRN: [id number] DOB:September 22, 1956, 68 y.o., male Today's Date: 10/12/2024  END OF SESSION:  PT End of Session - 10/12/24 1404     Visit Number 6    Number of Visits 16    Date for Recertification  11/05/24    Authorization Type BC[employer]    PT Start Time 1404    PT Stop Time 1445    PT Time Calculation (min) 41 min    Activity Tolerance Patient tolerated treatment well    Behavior During Therapy WFL for tasks assessed/performed          Past Medical History:  Diagnosis Date   Arthritis    BPH (benign prostatic hyperplasia)    Colon polyp    ED (erectile dysfunction)    Elevated liver enzymes    Gallstones    Hepatitis    History of anemia    Hypercholesterolemia    Hypothyroidism    Mild tricuspid regurgitation    Mitral valve regurgitation    Thyroid  disease    Vertigo    Past Surgical History:  Procedure Laterality Date   ANTERIOR CERVICAL DECOMP/DISCECTOMY FUSION N/A 07/23/2022   Procedure: Anterior Cervical Discectomy and Fusion Cervical Three-Four/Four-Five/Five-Six;  Surgeon: Debby Dorn MATSU, MD;  Location: [facility] OR;  Service: Neurosurgery;  Laterality: N/A;  3C   FLEXIBLE SIGMOIDOSCOPY     TOTAL HIP ARTHROPLASTY Left 01/06/2023   Procedure: LEFT TOTAL HIP ARTHROPLASTY ANTERIOR APPROACH;  Surgeon: Liam Lerner, MD;  Location: [facility] ORS;  Service: Orthopedics;  Laterality: Left;   Patient Active Problem List   Diagnosis Date Noted   UTI (urinary tract infection) 01/24/2023   Sepsis (HCC) 01/24/2023   Bacteremia 01/24/2023   AKI (acute kidney injury) 01/23/2023   Arthritis of left hip 01/01/2023   Paresthesia 08/21/2022   Stenosis of cervical spine with myelopathy (HCC) 07/23/2022   Gait abnormality 06/18/2022   Cervical myelopathy (HCC) 06/17/2022   Debility 06/06/2022   Neck pain 06/06/2022   OA (osteoarthritis) of hip 07/24/2021   Immunosuppressed status 09/05/2014    Hepatitis, autoimmune (HCC) 09/05/2014    PCP: Ladonna Dale Gull, MD REFERRING PROVIDER: Redell Robes, DO REFERRING DIAG: [phone] (ICD-10-CM) - Hamstring injury, right, initial encounter   THERAPY DIAG:  Pain in right thigh  Muscle weakness (generalized)  Other abnormalities of gait and mobility  Unsteadiness on feet  Right hamstring tendinopathy. Chronic, ongoing for 2 years. Please use dry needling.   Rationale for Evaluation and Treatment: Rehabilitation  ONSET DATE: 09/01/24  SUBJECTIVE:   SUBJECTIVE STATEMENT: The patient notes that his hamstring was sore on Sunday, felt better yesterday, and then cramping today.    EVAL: The patient is s/p cervical spine surgery 07/2022. He began at that time with R hamstring pain. He was also dx with peripheral neuropathy. He reports his pain is focal to R hamstring distal to IT and above the knee. He reports neuropathy is from bilateral shoulders down into hands and legs from waist down into feet are burning. It's almost uncomfortable to keep shoes on.  He reports general weakness since his neck surgery.  He recently finished at [facility]-- he has been getting PT off and on since surgery.  He recalls an immediate sensation of walking in quick sand after neck surgery.  He feels like he is losing some strength in HS muscle. His primary goal is reducing pain. He wants to try dry needling.  PERTINENT HISTORY: 07/2022 cervical  spine surgery, 12/2022 L hip replacement.  PAIN:  Are you having pain? Yes: NPRS scale: 3/10, on Sunday it was an 8/10 Pain location: R posterior thigh Pain description: pain and weakness Aggravating factors: mostly with walking Relieving factors: Cream, patches, a sleeve  Other pain: shoulders into arms/fingers and hips down numbness and weakness in all extremities. No neck pain  PRECAUTIONS: None  RED FLAGS: The patient reports constipation with bowels since surgery and frequent urination with bladder, no  incontinence.    WEIGHT BEARING RESTRICTIONS: No  FALLS:  Has patient fallen in last 6 months? Yes. Number of falls 1-- leg buckled  LIVING ENVIRONMENT: Lives with: lives with their spouse Lives in: House/apartment Stairs: can live on one level but has steps to home gym-- exercises daily M-F Has following equipment at home: Single point cane  OCCUPATION: retired  PLOF: Independent with numbness/tingling R arm (into last 2 fingers). Modified indep with cane since the surgery 8/23.  PATIENT GOALS: Getting rid of pain, gain strength and balance.   OBJECTIVE:  Note: Objective measures were completed at Evaluation unless otherwise noted.  DIAGNOSTIC FINDINGS:  He had severe cervical stenosis including myelomalacia and severe stenosis at C3-4, moderate-to-severe stenosis at C4-5, and severe stenosis at C5-6.   NCV with EMG 11/2022: Conclusion: This is an abnormal study.  There is electrodiagnostic evidence of chronic mild right C5-6-7 cervical radiculopathy, and also chronic right L4-5 S1 lumbar radiculopathy.  There is no evidence of large fiber peripheral neuropathy, or upper extremity focal neuropathy.   PATIENT SURVEYS:  Neuro and ortho diagnoses  COGNITION: Overall cognitive status: Within functional limits for tasks assessed     SENSATION: abnormality in sensation t/o bilat Ues and Les   PALPATION:           No specific tenderness noted with palpation today--- pain stays consistent t/o session  LOWER EXTREMITY ROM: Dec'd L hip ER as compared to R  LOWER EXTREMITY MMT: MMT Right eval Left eval  Hip flexion 5/5 5/5  Hip extension 3/5 3/5  Hip abduction 3+/5 3/5  Hip adduction    Hip internal rotation 4/5 4/5  Hip external rotation 4/5 3/5  Knee flexion 5/5 5/5  Knee extension 5/5 5/5  Ankle dorsiflexion 5/5 5/5  Ankle plantarflexion    Ankle inversion    Ankle eversion     (Blank rows = not tested)  GAIT: Distance walked: Gait with SPC into/out of clinic  with slow speed-- Cane utilized on R UE. Assistive device utilized: Single point cane Level of assistance: Modified independence          [facility] Adult PT Treatment:                                                DATE: 10/12/24 Therapeutic Exercise: Sidelying hip adduction x 10 reps R and L sides Prone knee flexion with 4# ankle weights x 20 reps to fatigue HS (to determine if cramping improves), then repeated on L side Neuromuscular re-ed: Single leg standing and clock reach R and L  Self release of muscle tightness using massage ball Therapeutic Activity: Quadriped hip rotation on yoga block x 10 reps R and L sides Quadriped to down dog  x 6 reps Modified side plank x 10 reps R and L sides Single leg dead lift in stages (wall reaches, then reaching  to mat, then using railing on treadmill) -- more challenging on L side   [facility] Adult PT Treatment:                                                DATE: 10/05/24 Manual Therapy: STM R hamstring and IASTM STM R adductors Dry needling: .Trigger Point Dry Needling Treatment: Pre-treatment instruction: Patient instructed on dry needling rationale, procedures, and possible side effects including pain during treatment (achy,cramping feeling), bruising, drop of blood, lightheadedness, nausea, sweating. Patient Consent Given: Yes Education handout provided: Previously provided Muscles treated: R hamstrings x 2 needles  Treatment response/outcome: Palpable decrease in muscle tension Post-treatment instructions: Patient instructed to expect possible mild to moderate muscle soreness later today and/or tomorrow. Patient instructed in methods to reduce muscle soreness and to continue prescribed HEP. If patient was dry needled over the lung field, patient was instructed on signs and symptoms of pneumothorax and, however unlikely, to see immediate medical attention should they occur. Patient was also educated on signs and symptoms of infection and to seek  medical attention should they occur. Patient verbalized understanding of these instructions and education. Therapeutic Activity: Quadriped <> down dog Quadriped hip extension + adding knee flexion Lateral step ups adding R hip abduction x 10 reps with L UE support, then repeat on R side lateral step up Single leg dead lift in stages (wall reaches, then reaching to mat, then using railing on treadmill) -- more challenging on L side  [facility] Adult PT Treatment:                                                DATE: 09/28/24 Therapeutic Exercise: Seated Seated heel slide (internally rotates L leg) Seated hip ER-- attempting stretch turned to the side with leg on mat Supine Stretch out strap HS x 2 reps Stretch out strap with adductor stretch + medial HS  Manual Therapy: STM R hamstring and R hip adductors Gait: Gait without device-- note L hip weakness and L foot moving into pronation leading to knee valgus-- we discussed strengthening the L LE in order to improve gait Dry Needling: Trigger Point Dry Needling Treatment: Pre-treatment instruction: Patient instructed on dry needling rationale, procedures, and possible side effects including pain during treatment (achy,cramping feeling), bruising, drop of blood, lightheadedness, nausea, sweating. Patient Consent Given: Yes Education handout provided: Yes Muscles treated: R medial HS and R adductor  Treatment response/outcome: Palpable decrease in muscle tension Post-treatment instructions: Patient instructed to expect possible mild to moderate muscle soreness later today and/or tomorrow. Patient instructed in methods to reduce muscle soreness and to continue prescribed HEP. If patient was dry needled over the lung field, patient was instructed on signs and symptoms of pneumothorax and, however unlikely, to see immediate medical attention should they occur. Patient was also educated on signs and symptoms of infection and to seek medical attention should  they occur. Patient verbalized understanding of these instructions and education.               PATIENT EDUCATION:  Education details: HEP, dry needling eduction (need to provide handout), PT plan of care Person educated: Patient Education method: Explanation, Demonstration, and Handouts Education comprehension: verbalized understanding and returned demonstration  HOM[facility] program: Access Code: [email or web address] URL: [email or web address] Date: 10/12/2024 Prepared by: Tawni Ferrier  Exercises - Sidelying Hip Abduction  - 1 x daily - 4 x weekly - 1 sets - 10 reps - Prone Hip Extension  - 1 x daily - 4 x weekly - 1 sets - 10 reps - Prone Knee Flexion AROM  - 1 x daily - 4 x weekly - 1 sets - 10 reps - Calf Mobilization with Small Ball  - 1 x daily - 4 x weekly - 1 sets - 1 reps - 2 minutes hold - Hip Adductors and Hamstring Stretch with Strap  - 1 x daily - 5 x weekly - 1 sets - 2 reps - 30 seconds hold - Bird Dog  - 1 x daily - 5 x weekly - 1 sets - 10 reps - Side Plank on Knees  - 1 x daily - 5 x weekly - 1 sets - 10 reps - Lateral Lunge Adductor Stretch with Counter Support  - 1 x daily - 5 x weekly - 1 sets - 2 reps - 30 seconds hold - Forward T with Counter Support  - 1 x daily - 5 x weekly - 2 sets - 10 reps - Hamstring Mobilization on Foam Roll  - 1 x daily - 4 x weekly - 1 sets - 1 reps  ASSESSMENT: CLINICAL IMPRESSION: The patient initially had a significant improvement in frequency in intensity of R HS pain with dry needling. He gets some mild benefit, but still has days that cramping in R HS is moderate in nature -- today he rates it a 5/10. PT is continuing to focus on strengthening in L hip and core stability to help reduce demand on R HS. Plan to continue working towards LTGs.   EVAL: Patient is a 68 y.o. male who was seen today for physical therapy evaluation and treatment for R hamstring tendinopathy. His symptoms began s/p cervical decompression surgery in 07/2022. He  has been receiving therapy intermittently since that time. He presents today with bilateral hip weakness, R HS weakness, pain that is constant at an 8/10. He does not report low back pain. He does report a sensation of general weakness in Les and changes to sensation in bilat Ues and Les. PT and patient discussed long h/o therapy and what his true goals for care are. He is most interested in managing HS pain. PT also did a thorough review of his medical record. He did have NCV in 12/23, which showed some evidence of R lumbar radiculopathy. PT to further assess response to interventions to determine effectiveness of treatment. We also discussed more focal HS and glut strength. He has avoided some of this due to concern of flaring pain (however pain did not change in session today).   OBJECTIVE IMPAIRMENTS: Abnormal gait, decreased ROM, decreased strength, increased fascial restrictions, impaired sensation, and pain.   GOALS: Goals reviewed with patient? Yes  SHORT TERM GOALS: Target date: 10/05/24  The patient will be indep with initial HEP Baseline: initiated at eval Goal status: MET  2.  The patient will report reduced R HS pain to < or equal to 6/10.  Baseline:  8/10, constant Goal status: MET on 09/28/24-- 5/10  3.  The patient will improve bilat hip abduction strength to > or equal to 4/5. Baseline:  See above Goal status: INITIAL  LONG TERM GOALS: Target date: 11/05/24  The patient will be indep with progression of HEP. Baseline:  initiated at eval  Goal status: INITIAL  2.  The patient will report pain < or equal to 4/10 at rest. Baseline: 8/10 Goal status: INITIAL  3.  The patient will verbalize strategies to reduce pain to manage chronic deficits. Baseline:  will instruct in self massage/ self release, and post d/c activities for pain mgmt. Goal status: INITIAL  PLAN:  PT FREQUENCY: 1-2x/week  PT DURATION: 8 weeks  PLANNED INTERVENTIONS: 97164- PT Re-evaluation,  97110-Therapeutic exercises, 97530- Therapeutic activity, [id number]- Neuromuscular re-education, 97535- Self Care, [id number]- Manual therapy, [id number]- Gait training, [phone]- Electrical stimulation (unattended), [phone]- Electrical stimulation (manual), [id number]- Traction (mechanical), 20560 (1-2 muscles), 20561 (3+ muscles)- Dry Needling, Patient/Family education, Balance training, Stair training, and Taping  PLAN FOR NEXT SESSION: check HEP, Focal HS eccentric strengthening, isometric contract/relax, work up to bridge with knee curls, standing eccentric load. Add adductor strengthening too.  DRY NEEDLING NEXT VISIT- progress hip abduction strengthening.   TEST MMT STG next visit   Niranjan Rufener, PT 10/12/2024, 3:57 PM

## 2024-10-13 ENCOUNTER — Other Ambulatory Visit: Payer: Self-pay

## 2024-10-13 ENCOUNTER — Ambulatory Visit (INDEPENDENT_AMBULATORY_CARE_PROVIDER_SITE_OTHER)

## 2024-10-13 VITALS — BP 114/80 | Ht 75.0 in | Wt 250.0 lb

## 2024-10-13 DIAGNOSIS — S76301A Unspecified injury of muscle, fascia and tendon of the posterior muscle group at thigh level, right thigh, initial encounter: Secondary | ICD-10-CM

## 2024-10-13 NOTE — Progress Notes (Signed)
   Subjective:    Patient ID: Alex West, male    DOB: 68 y.o., [birth year]   MRN: [id number]  Chief Complaint: R hamstring tendinopathy follow up  Discussed the use of AI scribe software for clinical note transcription with the patient, who gave verbal consent to proceed.  Alex West is a pleasant 68 year old male presenting for follow-up of his right posterior thigh that started about 2 years ago.  He received surgery for a severe cervical stenosis myelomalacia status post C3-6 ACDF, with continued myelopathic neuropathic pain.   He was ultimately diagnosed with hamstring tendinopathy at his last visit on 09/01/2024 with myself  He was referred to physical therapy and initiated on an anti-inflammatory medicine. History of Present Illness Alex West is a 68 year old male who presents with persistent right hamstring pain and cramping.  Right hamstring pain and cramping - Persistent right hamstring pain and cramping for two years - Pain is excruciating at times - Pain primarily located along the inside and up and down the band of the right hamstring - Cramping is a significant component of the discomfort - Episodes of relief followed by recurrence of symptoms - Pain began after neck surgery, initially suspected to be sciatica, but remains localized to the hamstring area  Therapeutic interventions and response - Attended physical therapy once a week for six weeks, including dry needling - Initial relief from therapy, but pain and cramping returned - Continues to experience persistent pain and cramping despite therapy  Medication use and tolerance - Takes anti-inflammatory medication twice daily, but often misses the second dose - No adverse effects from anti-inflammatory medication  Functional status and mobility - Manages neuropathy without a cane, indicating some improvement in mobility - Seeks further relief to maintain activity level     Objective:    There were no vitals filed for this visit.  R leg: TTP in the body of the hamstring muscle with slight predominance medially.  Extracorporeal Shockwave Therapy Procedure Following the description of risks including pain, bruising, local skin irritation, damage to surrounding structures, patient provided verbal/written consent for ESWT procedure. Palpation was used to identify the R hamstring veil of semimembranosus. Patient and probe was sterilely prepped in the usual fashion with alcohol.  Total strikes: 2000 Intensity: 80 Frequency: 10  Patient tolerated well without complication. Precautions provided.      Assessment & Plan:   Assessment & Plan Right hamstring tendinopathy   Chronic right hamstring tendinopathy with persistent pain and cramping in the semimembranosus area for two years. Partial improvement noted with six weeks of physical therapy and anti-inflammatory medication. Likely due to a small tear that has not fully healed. Initial differential diagnosis included sciatica, but current symptoms align with tendinopathy. Dry needling provided temporary relief, but symptoms recur. Initiate shockwave therapy once weekly for three weeks and reassess after this period. Shockwave therapy, supported by extensive data, enhances physical therapy effects. It is not covered by insurance and costs $75 per session, which is significantly less than the $250 charged at other institutions. Continue physical therapy. Consider dextrose  prolotherapy if no improvement with shockwave therapy.

## 2024-10-19 ENCOUNTER — Ambulatory Visit

## 2024-10-19 ENCOUNTER — Ambulatory Visit: Admitting: Rehabilitative and Restorative Service Providers"

## 2024-10-19 ENCOUNTER — Encounter: Payer: Self-pay | Admitting: Rehabilitative and Restorative Service Providers"

## 2024-10-19 DIAGNOSIS — M79651 Pain in right thigh: Secondary | ICD-10-CM

## 2024-10-19 DIAGNOSIS — R2689 Other abnormalities of gait and mobility: Secondary | ICD-10-CM

## 2024-10-19 DIAGNOSIS — S76301A Unspecified injury of muscle, fascia and tendon of the posterior muscle group at thigh level, right thigh, initial encounter: Secondary | ICD-10-CM | POA: Diagnosis not present

## 2024-10-19 DIAGNOSIS — M6281 Muscle weakness (generalized): Secondary | ICD-10-CM

## 2024-10-19 NOTE — Therapy (Signed)
 OUTPATIENT PHYSICAL THERAPY LOWER EXTREMITY TREATMENT   Patient Name: [place] MRN: [id number] DOB:09/05/1956, 68 y.o., male Today's Date: 10/19/2024  END OF SESSION:  PT End of Session - 10/19/24 1319     Visit Number 7    Number of Visits 16    Date for Recertification  11/05/24    Authorization Type [employer]    PT Start Time 1316    PT Stop Time 1400    PT Time Calculation (min) 44 min    Activity Tolerance Patient tolerated treatment well    Behavior During Therapy WFL for tasks assessed/performed           Past Medical History:  Diagnosis Date   Arthritis    BPH (benign prostatic hyperplasia)    Colon polyp    ED (erectile dysfunction)    Elevated liver enzymes    Gallstones    Hepatitis    History of anemia    Hypercholesterolemia    Hypothyroidism    Mild tricuspid regurgitation    Mitral valve regurgitation    Thyroid  disease    Vertigo    Past Surgical History:  Procedure Laterality Date   ANTERIOR CERVICAL DECOMP/DISCECTOMY FUSION N/A 07/23/2022   Procedure: Anterior Cervical Discectomy and Fusion Cervical Three-Four/Four-Five/Five-Six;  Surgeon: Debby Dorn MATSU, MD;  Location: [facility] OR;  Service: Neurosurgery;  Laterality: N/A;  3C   FLEXIBLE SIGMOIDOSCOPY     TOTAL HIP ARTHROPLASTY Left 01/06/2023   Procedure: LEFT TOTAL HIP ARTHROPLASTY ANTERIOR APPROACH;  Surgeon: Liam Lerner, MD;  Location: [facility] ORS;  Service: Orthopedics;  Laterality: Left;   Patient Active Problem List   Diagnosis Date Noted   UTI (urinary tract infection) 01/24/2023   Sepsis (HCC) 01/24/2023   Bacteremia 01/24/2023   AKI (acute kidney injury) 01/23/2023   Arthritis of left hip 01/01/2023   Paresthesia 08/21/2022   Stenosis of cervical spine with myelopathy (HCC) 07/23/2022   Gait abnormality 06/18/2022   Cervical myelopathy (HCC) 06/17/2022   Debility 06/06/2022   Neck pain 06/06/2022   OA (osteoarthritis) of hip 07/24/2021   Immunosuppressed status 09/05/2014    Hepatitis, autoimmune (HCC) 09/05/2014    PCP: Ladonna Dale Gull, MD REFERRING PROVIDER: Redell Robes, DO REFERRING DIAG: [phone] (ICD-10-CM) - Hamstring injury, right, initial encounter   THERAPY DIAG:  Pain in right thigh  Muscle weakness (generalized)  Other abnormalities of gait and mobility  Right hamstring tendinopathy. Chronic, ongoing for 2 years. Please use dry needling.   Rationale for Evaluation and Treatment: Rehabilitation  ONSET DATE: 09/01/24  SUBJECTIVE:   SUBJECTIVE STATEMENT: The patient reports that he had increased pain after shock wave therapy, then used heat. The day after, pain was improved the next day, but then returned at 8/10. He also has started riding a bike outdoors in the past 2 weeks-- we discussed that some of this may also be contributing to increased pain.   EVAL: The patient is s/p cervical spine surgery 07/2022. He began at that time with R hamstring pain. He was also dx with peripheral neuropathy. He reports his pain is focal to R hamstring distal to IT and above the knee. He reports neuropathy is from bilateral shoulders down into hands and legs from waist down into feet are burning. It's almost uncomfortable to keep shoes on.  He reports general weakness since his neck surgery.  He recently finished at [facility]-- he has been getting PT off and on since surgery.  He recalls an immediate sensation of walking in  quick sand after neck surgery.  He feels like he is losing some strength in HS muscle. His primary goal is reducing pain. He wants to try dry needling.  PERTINENT HISTORY: 07/2022 cervical spine surgery, 12/2022 L hip replacement.  PAIN:  Are you having pain? Yes: NPRS scale: 8/10  Pain location: R posterior thigh Pain description: pain and weakness Aggravating factors: mostly with walking Relieving factors: Cream, patches (quali)  a sleeve  Other pain: shoulders into arms/fingers and hips down numbness and weakness in all  extremities. No neck pain  PRECAUTIONS: None  RED FLAGS: The patient reports constipation with bowels since surgery and frequent urination with bladder, no incontinence.    WEIGHT BEARING RESTRICTIONS: No  FALLS:  Has patient fallen in last 6 months? Yes. Number of falls 1-- leg buckled  LIVING ENVIRONMENT: Lives with: lives with their spouse Lives in: House/apartment Stairs: can live on one level but has steps to home gym-- exercises daily M-F Has following equipment at home: Single point cane  OCCUPATION: retired  PLOF: Independent with numbness/tingling R arm (into last 2 fingers). Modified indep with cane since the surgery 8/23.  PATIENT GOALS: Getting rid of pain, gain strength and balance.   OBJECTIVE:  Note: Objective measures were completed at Evaluation unless otherwise noted.  DIAGNOSTIC FINDINGS:  He had severe cervical stenosis including myelomalacia and severe stenosis at C3-4, moderate-to-severe stenosis at C4-5, and severe stenosis at C5-6.   NCV with EMG 11/2022: Conclusion: This is an abnormal study.  There is electrodiagnostic evidence of chronic mild right C5-6-7 cervical radiculopathy, and also chronic right L4-5 S1 lumbar radiculopathy.  There is no evidence of large fiber peripheral neuropathy, or upper extremity focal neuropathy.   PATIENT SURVEYS:  Neuro and ortho diagnoses  COGNITION: Overall cognitive status: Within functional limits for tasks assessed     SENSATION: abnormality in sensation t/o bilat Ues and Les   PALPATION:           No specific tenderness noted with palpation today--- pain stays consistent t/o session  LOWER EXTREMITY ROM: Dec'd L hip ER as compared to R  LOWER EXTREMITY MMT: MMT Right eval Left eval  Hip flexion 5/5 5/5  Hip extension 3/5 3/5  Hip abduction 3+/5 3/5  Hip adduction    Hip internal rotation 4/5 4/5  Hip external rotation 4/5 3/5  Knee flexion 5/5 5/5  Knee extension 5/5 5/5  Ankle dorsiflexion 5/5  5/5  Ankle plantarflexion    Ankle inversion    Ankle eversion     (Blank rows = not tested)  GAIT: Distance walked: Gait with SPC into/out of clinic with slow speed-- Cane utilized on R UE. Assistive device utilized: Single point cane Level of assistance: Modified independence          [facility] Adult PT Treatment:                                                DATE: 10/19/24 Manual Therapy: IASTM R hamstring musculature and STM + adductors Therapeutic Activity: Supine hamstring bridges x 5 reps with toes lifted  Bridges with holds and alternating LE kicks x 8 reps Bridges with ball rolling in to chest for HS x 6 reps x 2 sets  Single leg dead lift R and L x 10 reps Askling protocol gliders x 8 reps R and L  Dry Needling: Trigger Point Dry Needling Treatment: Pre-treatment instruction: Patient instructed on dry needling rationale, procedures, and possible side effects including pain during treatment (achy,cramping feeling), bruising, drop of blood, lightheadedness, nausea, sweating. Patient Consent Given: Yes Education handout provided: Previously provided Muscles treated: R hamstring 2 needles  Electrical stimulation performed: No Treatment response/outcome: Palpable decrease in muscle tension Post-treatment instructions: Patient instructed to expect possible mild to moderate muscle soreness later today and/or tomorrow. Patient instructed in methods to reduce muscle soreness and to continue prescribed HEP. If patient was dry needled over the lung field, patient was instructed on signs and symptoms of pneumothorax and, however unlikely, to see immediate medical attention should they occur. Patient was also educated on signs and symptoms of infection and to seek medical attention should they occur. Patient verbalized understanding of these instructions and education.   [facility] Adult PT Treatment:                                                DATE: 10/12/24 Therapeutic Exercise: Sidelying  hip adduction x 10 reps R and L sides Prone knee flexion with 4# ankle weights x 20 reps to fatigue HS (to determine if cramping improves), then repeated on L side Neuromuscular re-ed: Single leg standing and clock reach R and L  Self release of muscle tightness using massage ball Therapeutic Activity: Quadriped hip rotation on yoga block x 10 reps R and L sides Quadriped to down dog  x 6 reps Modified side plank x 10 reps R and L sides Single leg dead lift in stages (wall reaches, then reaching to mat, then using railing on treadmill) -- more challenging on L side   [facility] Adult PT Treatment:                                                DATE: 10/05/24 Manual Therapy: STM R hamstring and IASTM STM R adductors Dry needling: .Trigger Point Dry Needling Treatment: Pre-treatment instruction: Patient instructed on dry needling rationale, procedures, and possible side effects including pain during treatment (achy,cramping feeling), bruising, drop of blood, lightheadedness, nausea, sweating. Patient Consent Given: Yes Education handout provided: Previously provided Muscles treated: R hamstrings x 2 needles  Treatment response/outcome: Palpable decrease in muscle tension Post-treatment instructions: Patient instructed to expect possible mild to moderate muscle soreness later today and/or tomorrow. Patient instructed in methods to reduce muscle soreness and to continue prescribed HEP. If patient was dry needled over the lung field, patient was instructed on signs and symptoms of pneumothorax and, however unlikely, to see immediate medical attention should they occur. Patient was also educated on signs and symptoms of infection and to seek medical attention should they occur. Patient verbalized understanding of these instructions and education. Therapeutic Activity: Quadriped <> down dog Quadriped hip extension + adding knee flexion Lateral step ups adding R hip abduction x 10 reps with L UE  support, then repeat on R side lateral step up Single leg dead lift in stages (wall reaches, then reaching to mat, then using railing on treadmill) -- more challenging on L side  [facility] Adult PT Treatment:  DATE: 09/28/24 Therapeutic Exercise: Seated Seated heel slide (internally rotates L leg) Seated hip ER-- attempting stretch turned to the side with leg on mat Supine Stretch out strap HS x 2 reps Stretch out strap with adductor stretch + medial HS  Manual Therapy: STM R hamstring and R hip adductors Gait: Gait without device-- note L hip weakness and L foot moving into pronation leading to knee valgus-- we discussed strengthening the L LE in order to improve gait Dry Needling: Trigger Point Dry Needling Treatment: Pre-treatment instruction: Patient instructed on dry needling rationale, procedures, and possible side effects including pain during treatment (achy,cramping feeling), bruising, drop of blood, lightheadedness, nausea, sweating. Patient Consent Given: Yes Education handout provided: Yes Muscles treated: R medial HS and R adductor  Treatment response/outcome: Palpable decrease in muscle tension Post-treatment instructions: Patient instructed to expect possible mild to moderate muscle soreness later today and/or tomorrow. Patient instructed in methods to reduce muscle soreness and to continue prescribed HEP. If patient was dry needled over the lung field, patient was instructed on signs and symptoms of pneumothorax and, however unlikely, to see immediate medical attention should they occur. Patient was also educated on signs and symptoms of infection and to seek medical attention should they occur. Patient verbalized understanding of these instructions and education.               PATIENT EDUCATION:  Education details: HEP, dry needling eduction (need to provide handout), PT plan of care Person educated: Patient Education method:  Explanation, Demonstration, and Handouts Education comprehension: verbalized understanding and returned demonstration  [facility]OM[facility] program: Access Code: [email or web address] URL: [email or web address] Date: 10/12/2024 Prepared by: Tawni Ferrier  Exercises - Sidelying Hip Abduction  - 1 x daily - 4 x weekly - 1 sets - 10 reps - Prone Hip Extension  - 1 x daily - 4 x weekly - 1 sets - 10 reps - Prone Knee Flexion AROM  - 1 x daily - 4 x weekly - 1 sets - 10 reps - Calf Mobilization with Small Ball  - 1 x daily - 4 x weekly - 1 sets - 1 reps - 2 minutes hold - Hip Adductors and Hamstring Stretch with Strap  - 1 x daily - 5 x weekly - 1 sets - 2 reps - 30 seconds hold - Bird Dog  - 1 x daily - 5 x weekly - 1 sets - 10 reps - Side Plank on Knees  - 1 x daily - 5 x weekly - 1 sets - 10 reps - Lateral Lunge Adductor Stretch with Counter Support  - 1 x daily - 5 x weekly - 1 sets - 2 reps - 30 seconds hold - Forward T with Counter Support  - 1 x daily - 5 x weekly - 2 sets - 10 reps - Hamstring Mobilization on Foam Roll  - 1 x daily - 4 x weekly - 1 sets - 1 reps  ASSESSMENT: CLINICAL IMPRESSION: The patient felt pain improved after DN and exercise today. PT and patient discussed some of the other activities he does with a full workout routine of cardio x 30-45 minutes on elliptical, recently adding the bike to his program, and performing strength training multiple days/week. Overall pain had reduced initially with therapy, but it has increased recently. We discussed home activities and modifying new addition of biking outdoors.   EVAL: Patient is a 68 y.o. male who was seen today for physical therapy evaluation and treatment for R hamstring tendinopathy. His  symptoms began s/p cervical decompression surgery in 07/2022. He has been receiving therapy intermittently since that time. He presents today with bilateral hip weakness, R HS weakness, pain that is constant at an 8/10. He does not report low back  pain. He does report a sensation of general weakness in Les and changes to sensation in bilat Ues and Les. PT and patient discussed long h/o therapy and what his true goals for care are. He is most interested in managing HS pain. PT also did a thorough review of his medical record. He did have NCV in 12/23, which showed some evidence of R lumbar radiculopathy. PT to further assess response to interventions to determine effectiveness of treatment. We also discussed more focal HS and glut strength. He has avoided some of this due to concern of flaring pain (however pain did not change in session today).   OBJECTIVE IMPAIRMENTS: Abnormal gait, decreased ROM, decreased strength, increased fascial restrictions, impaired sensation, and pain.   GOALS: Goals reviewed with patient? Yes  SHORT TERM GOALS: Target date: 10/05/24  The patient will be indep with initial HEP Baseline: initiated at eval Goal status: MET  2.  The patient will report reduced R HS pain to < or equal to 6/10.  Baseline:  8/10, constant Goal status: MET on 09/28/24-- 5/10  3.  The patient will improve bilat hip abduction strength to > or equal to 4/5. Baseline:  See above Goal status: ONGOING  LONG TERM GOALS: Target date: 11/05/24  The patient will be indep with progression of HEP. Baseline:  initiated at eval Goal status: INITIAL  2.  The patient will report pain < or equal to 4/10 at rest. Baseline: 8/10 Goal status: INITIAL  3.  The patient will verbalize strategies to reduce pain to manage chronic deficits. Baseline:  will instruct in self massage/ self release, and post d/c activities for pain mgmt. Goal status: INITIAL  PLAN:  PT FREQUENCY: 1-2x/week  PT DURATION: 8 weeks  PLANNED INTERVENTIONS: 97164- PT Re-evaluation, 97110-Therapeutic exercises, 97530- Therapeutic activity, [id number]- Neuromuscular re-education, 97535- Self Care, [id number]- Manual therapy, [id number]- Gait training, [phone]- Electrical stimulation  (unattended), [phone]- Electrical stimulation (manual), [id number]- Traction (mechanical), 20560 (1-2 muscles), 20561 (3+ muscles)- Dry Needling, Patient/Family education, Balance training, Stair training, and Taping  PLAN FOR NEXT SESSION: check HEP, Focal HS eccentric strengthening, isometric contract/relax, work up to bridge with knee curls, standing eccentric load. Add adductor strengthening too.  DRY NEEDLING NEXT VISIT- progress hip abduction strengthening left.   Gianpaolo Mindel, PT 10/19/2024, 1:19 PM

## 2024-10-21 ENCOUNTER — Ambulatory Visit (INDEPENDENT_AMBULATORY_CARE_PROVIDER_SITE_OTHER): Payer: Self-pay

## 2024-10-21 VITALS — Ht 75.0 in | Wt 250.0 lb

## 2024-10-21 DIAGNOSIS — S76311D Strain of muscle, fascia and tendon of the posterior muscle group at thigh level, right thigh, subsequent encounter: Secondary | ICD-10-CM

## 2024-10-21 NOTE — Progress Notes (Signed)
   Subjective:    Patient ID: Alex [place], male    DOB: 68 y.o., December 07, 1956   MRN: [id number]  Chief Complaint: R hamstring tendinopathy Shockwave Treatment #2 Still working with physical therapy in [place] Tried biking 2 miles and things worsened this past week Taking naproxen  1-2 times daily Has a glass of wine usually nightly    Objective:   There were no vitals filed for this visit.  R leg: TTP in the body of the hamstring muscle with slight predominance medially.  Extracorporeal Shockwave Therapy Procedure Following the description of risks including pain, bruising, local skin irritation, damage to surrounding structures, patient provided verbal/written consent for ESWT procedure. Palpation was used to identify the R hamstring veil of semimembranosus. Patient and probe was sterilely prepped in the usual fashion with alcohol.  Total strikes: 2000 Intensity: 80 Frequency: 10  Patient tolerated well without complication. Precautions provided.      Assessment & Plan:   Assessment & Plan Right hamstring tendinopathy   Continued pain despite pain-free interval following initial treatment.  Counseled on holding alcohol and NSAIDs to optimize shockwave therapy effect.  Treatment #2 provided today.  Follow-up in 1 week for treatment 3.  Plan to assess halfway through the week after treatment #3 if we should continue with shockwave or pivot to prolotherapy.

## 2024-10-25 ENCOUNTER — Encounter: Payer: Self-pay | Admitting: Radiology

## 2024-10-26 ENCOUNTER — Encounter: Payer: Self-pay | Admitting: Rehabilitative and Restorative Service Providers"

## 2024-10-26 ENCOUNTER — Ambulatory Visit: Attending: Family Medicine | Admitting: Rehabilitative and Restorative Service Providers"

## 2024-10-26 ENCOUNTER — Ambulatory Visit

## 2024-10-26 DIAGNOSIS — M79651 Pain in right thigh: Secondary | ICD-10-CM | POA: Diagnosis not present

## 2024-10-26 DIAGNOSIS — M6281 Muscle weakness (generalized): Secondary | ICD-10-CM | POA: Diagnosis not present

## 2024-10-26 DIAGNOSIS — R2689 Other abnormalities of gait and mobility: Secondary | ICD-10-CM | POA: Insufficient documentation

## 2024-10-26 NOTE — Therapy (Unsigned)
 OUTPATIENT PHYSICAL THERAPY LOWER EXTREMITY TREATMENT   Patient Name: Alex West MRN: [id number] DOB:19-Jan-1956, 68 y.o., male Today's Date: 10/26/2024  END OF SESSION:  PT End of Session - 10/26/24 1316     Visit Number 8    Number of Visits 16    Date for Recertification  11/05/24    Authorization Type [employer]    PT Start Time 1317    PT Stop Time 1400    PT Time Calculation (min) 43 min    Activity Tolerance Patient tolerated treatment well    Behavior During Therapy WFL for tasks assessed/performed          Past Medical History:  Diagnosis Date   Arthritis    BPH (benign prostatic hyperplasia)    Colon polyp    ED (erectile dysfunction)    Elevated liver enzymes    Gallstones    Hepatitis    History of anemia    Hypercholesterolemia    Hypothyroidism    Mild tricuspid regurgitation    Mitral valve regurgitation    Thyroid  disease    Vertigo    Past Surgical History:  Procedure Laterality Date   ANTERIOR CERVICAL DECOMP/DISCECTOMY FUSION N/A 07/23/2022   Procedure: Anterior Cervical Discectomy and Fusion Cervical Three-Four/Four-Five/Five-Six;  Surgeon: Debby Dorn MATSU, MD;  Location: [facility] OR;  Service: Neurosurgery;  Laterality: N/A;  3C   FLEXIBLE SIGMOIDOSCOPY     TOTAL HIP ARTHROPLASTY Left 01/06/2023   Procedure: LEFT TOTAL HIP ARTHROPLASTY ANTERIOR APPROACH;  Surgeon: Liam Lerner, MD;  Location: [facility] ORS;  Service: Orthopedics;  Laterality: Left;   Patient Active Problem List   Diagnosis Date Noted   UTI (urinary tract infection) 01/24/2023   Sepsis (HCC) 01/24/2023   Bacteremia 01/24/2023   AKI (acute kidney injury) 01/23/2023   Arthritis of left hip 01/01/2023   Paresthesia 08/21/2022   Stenosis of cervical spine with myelopathy (HCC) 07/23/2022   Gait abnormality 06/18/2022   Cervical myelopathy (HCC) 06/17/2022   Debility 06/06/2022   Neck pain 06/06/2022   OA (osteoarthritis) of hip 07/24/2021   Immunosuppressed status 09/05/2014    Hepatitis, autoimmune (HCC) 09/05/2014    PCP: Ladonna Dale Gull, MD REFERRING PROVIDER: Redell Robes, DO REFERRING DIAG: [phone] (ICD-10-CM) - Hamstring injury, right, initial encounter   THERAPY DIAG:  Pain in right thigh  Muscle weakness (generalized)  Other abnormalities of gait and mobility  Right hamstring tendinopathy. Chronic, ongoing for 2 years. Please use dry needling.   Rationale for Evaluation and Treatment: Rehabilitation  ONSET DATE: 09/01/24  SUBJECTIVE:   SUBJECTIVE STATEMENT: The patient reports that he got temporary benefit from dry needling last session and got temporary benefit from 2nd treatment with Dr. Robes. Nothing is lasting. He notes he gets 24-48 hours of relief and it returns to 8/10 pain.  He stretched, did yoga, did bike ride, then did strengthening routine. He used a compression sleeve while working out. His pain level is okay. We discussed steadiness with gait and he had to walk 2 blocks to [facility] and felt it was a struggle b/c of hamstring and the neuropathy.   EVAL: The patient is s/p cervical spine surgery 07/2022. He began at that time with R hamstring pain. He was also dx with peripheral neuropathy. He reports his pain is focal to R hamstring distal to IT and above the knee. He reports neuropathy is from bilateral shoulders down into hands and legs from waist down into feet are burning. It's almost uncomfortable to keep shoes  on.  He reports general weakness since his neck surgery.  He recently finished at [facility]-- he has been getting PT off and on since surgery.  He recalls an immediate sensation of walking in quick sand after neck surgery.  He feels like he is losing some strength in HS muscle. His primary goal is reducing pain. He wants to try dry needling.  PERTINENT HISTORY: 07/2022 cervical spine surgery, 12/2022 L hip replacement.  PAIN:  Are you having pain? Yes: NPRS scale: 4-5/10  Pain location: R posterior  thigh Pain description: pain and weakness Aggravating factors: mostly with walking Relieving factors: Cream, patches (quali)  a sleeve  Other pain: shoulders into arms/fingers and hips down numbness and weakness in all extremities. No neck pain  PRECAUTIONS: None  RED FLAGS: The patient reports constipation with bowels since surgery and frequent urination with bladder, no incontinence.    WEIGHT BEARING RESTRICTIONS: No  FALLS:  Has patient fallen in last 6 months? Yes. Number of falls 1-- leg buckled  LIVING ENVIRONMENT: Lives with: lives with their spouse Lives in: House/apartment Stairs: can live on one level but has steps to home gym-- exercises daily M-F Has following equipment at home: Single point cane  OCCUPATION: retired  PLOF: Independent with numbness/tingling R arm (into last 2 fingers). Modified indep with cane since the surgery 8/23.  PATIENT GOALS: Getting rid of pain, gain strength and balance.   OBJECTIVE:  Note: Objective measures were completed at Evaluation unless otherwise noted.  DIAGNOSTIC FINDINGS:  He had severe cervical stenosis including myelomalacia and severe stenosis at C3-4, moderate-to-severe stenosis at C4-5, and severe stenosis at C5-6.   NCV with EMG 11/2022: Conclusion: This is an abnormal study.  There is electrodiagnostic evidence of chronic mild right C5-6-7 cervical radiculopathy, and also chronic right L4-5 S1 lumbar radiculopathy.  There is no evidence of large fiber peripheral neuropathy, or upper extremity focal neuropathy.   PATIENT SURVEYS:  Neuro and ortho diagnoses  COGNITION: Overall cognitive status: Within functional limits for tasks assessed     SENSATION: abnormality in sensation t/o bilat Ues and Les   PALPATION:           No specific tenderness noted with palpation today--- pain stays consistent t/o session  LOWER EXTREMITY ROM: Dec'd L hip ER as compared to R  LOWER EXTREMITY MMT: MMT Right eval Left eval   Hip flexion 5/5 5/5  Hip extension 3/5 3/5  Hip abduction 3+/5 3/5  Hip adduction    Hip internal rotation 4/5 4/5  Hip external rotation 4/5 3/5  Knee flexion 5/5 5/5  Knee extension 5/5 5/5  Ankle dorsiflexion 5/5 5/5  Ankle plantarflexion    Ankle inversion    Ankle eversion     (Blank rows = not tested)  GAIT: Distance walked: Gait with SPC into/out of clinic with slow speed-- Cane utilized on R UE. Assistive device utilized: Single point cane Level of assistance: Modified independence          [facility] Adult PT Treatment:                                                DATE: 10/26/24 Therapeutic Exercise: Sidelying hip abduction L side x 20 reps and R side 15 reps Therapeutic Activity: Side plank modified with knees bent T reach single leg x 10 reps x  R and L sides Single limb stance activities maintaining level pelvis Split lunges x 10 reps R and L x 1 sets Split lunges with reach to cone x 10 reps R and L Gait: Gait on tradmill x 6 minutes 2.2 mph x 3% incline Gait is characterized by throwing the foot out to hit heel strike and bilat trendelenberg gait Gait with mirror watching hip trendelenberg to demo to patient why sensation of instability can be present with hip weakness    [facility] Adult PT Treatment:                                                DATE: 10/19/24 Manual Therapy: IASTM R hamstring musculature and STM + adductors Therapeutic Activity: Supine hamstring bridges x 5 reps with toes lifted  Bridges with holds and alternating LE kicks x 8 reps Bridges with ball rolling in to chest for HS x 6 reps x 2 sets  Single leg dead lift R and L x 10 reps Askling protocol gliders x 8 reps R and L  Dry Needling: Trigger Point Dry Needling Treatment: Pre-treatment instruction: Patient instructed on dry needling rationale, procedures, and possible side effects including pain during treatment (achy,cramping feeling), bruising, drop of blood, lightheadedness, nausea,  sweating. Patient Consent Given: Yes Education handout provided: Previously provided Muscles treated: R hamstring 2 needles  Electrical stimulation performed: No Treatment response/outcome: Palpable decrease in muscle tension Post-treatment instructions: Patient instructed to expect possible mild to moderate muscle soreness later today and/or tomorrow. Patient instructed in methods to reduce muscle soreness and to continue prescribed HEP. If patient was dry needled over the lung field, patient was instructed on signs and symptoms of pneumothorax and, however unlikely, to see immediate medical attention should they occur. Patient was also educated on signs and symptoms of infection and to seek medical attention should they occur. Patient verbalized understanding of these instructions and education.   [facility] Adult PT Treatment:                                                DATE: 10/12/24 Therapeutic Exercise: Sidelying hip adduction x 10 reps R and L sides Prone knee flexion with 4# ankle weights x 20 reps to fatigue HS (to determine if cramping improves), then repeated on L side Neuromuscular re-ed: Single leg standing and clock reach R and L  Self release of muscle tightness using massage ball Therapeutic Activity: Quadriped hip rotation on yoga block x 10 reps R and L sides Quadriped to down dog  x 6 reps Modified side plank x 10 reps R and L sides Single leg dead lift in stages (wall reaches, then reaching to mat, then using railing on treadmill) -- more challenging on L side   [facility] Adult PT Treatment:                                                DATE: 10/05/24 Manual Therapy: STM R hamstring and IASTM STM R adductors Dry needling: .Trigger Point Dry Needling Treatment: Pre-treatment instruction: Patient instructed on dry needling rationale, procedures, and possible  side effects including pain during treatment (achy,cramping feeling), bruising, drop of blood, lightheadedness, nausea,  sweating. Patient Consent Given: Yes Education handout provided: Previously provided Muscles treated: R hamstrings x 2 needles  Treatment response/outcome: Palpable decrease in muscle tension Post-treatment instructions: Patient instructed to expect possible mild to moderate muscle soreness later today and/or tomorrow. Patient instructed in methods to reduce muscle soreness and to continue prescribed HEP. If patient was dry needled over the lung field, patient was instructed on signs and symptoms of pneumothorax and, however unlikely, to see immediate medical attention should they occur. Patient was also educated on signs and symptoms of infection and to seek medical attention should they occur. Patient verbalized understanding of these instructions and education. Therapeutic Activity: Quadriped <> down dog Quadriped hip extension + adding knee flexion Lateral step ups adding R hip abduction x 10 reps with L UE support, then repeat on R side lateral step up Single leg dead lift in stages (wall reaches, then reaching to mat, then using railing on treadmill) -- more challenging on L side  [facility] Adult PT Treatment:                                                DATE: 09/28/24 Therapeutic Exercise: Seated Seated heel slide (internally rotates L leg) Seated hip ER-- attempting stretch turned to the side with leg on mat Supine Stretch out strap HS x 2 reps Stretch out strap with adductor stretch + medial HS  Manual Therapy: STM R hamstring and R hip adductors Gait: Gait without device-- note L hip weakness and L foot moving into pronation leading to knee valgus-- we discussed strengthening the L LE in order to improve gait Dry Needling: Trigger Point Dry Needling Treatment: Pre-treatment instruction: Patient instructed on dry needling rationale, procedures, and possible side effects including pain during treatment (achy,cramping feeling), bruising, drop of blood, lightheadedness, nausea,  sweating. Patient Consent Given: Yes Education handout provided: Yes Muscles treated: R medial HS and R adductor  Treatment response/outcome: Palpable decrease in muscle tension Post-treatment instructions: Patient instructed to expect possible mild to moderate muscle soreness later today and/or tomorrow. Patient instructed in methods to reduce muscle soreness and to continue prescribed HEP. If patient was dry needled over the lung field, patient was instructed on signs and symptoms of pneumothorax and, however unlikely, to see immediate medical attention should they occur. Patient was also educated on signs and symptoms of infection and to seek medical attention should they occur. Patient verbalized understanding of these instructions and education.               PATIENT EDUCATION:  Education details: HEP, dry needling eduction (need to provide handout), PT plan of care Person educated: Patient Education method: Explanation, Demonstration, and Handouts Education comprehension: verbalized understanding and returned demonstration  HOMe program: Access Code: [email or web address] URL: [email or web address] Date: 10/27/2024 Prepared by: Tawni Ferrier  Exercises - Sidelying Hip Abduction  - 1 x daily - 4 x weekly - 2 sets - 15 reps - Prone Hip Extension  - 1 x daily - 4 x weekly - 1 sets - 10 reps - Prone Knee Flexion AROM  - 1 x daily - 4 x weekly - 1 sets - 10 reps - Bird Dog  - 1 x daily - 5 x weekly - 1 sets - 10 reps - Side Plank on  Knees  - 1 x daily - 5 x weekly - 1 sets - 10 reps - Split Squats Forward Trunk  - 2 x daily - 7 x weekly - 1 sets - 10 reps - Lateral Lunge Adductor Stretch with Counter Support  - 1 x daily - 5 x weekly - 1 sets - 2 reps - 30 seconds hold - Hamstring Mobilization on Foam Roll  - 1 x daily - 4 x weekly - 1 sets - 1 reps - Calf Mobilization with Small Ball  - 1 x daily - 4 x weekly - 1 sets - 1 reps - 2 minutes hold - Hip Adductors and Hamstring Stretch  with Strap  - 1 x daily - 5 x weekly - 1 sets - 2 reps - 30 seconds hold  ASSESSMENT: CLINICAL IMPRESSION: The patient is not getting lasting relief from dry needling or other procedures. PT and patient discussed need for ongoing strengthening. We reviewed older exercises-- the patient has been doing his program at home and not isolating hip abductor strengthening-- he continues to need focused hip abductor and glut strengthening. We also discussed hip weakness as one of the contributing factors for instability. PT to continue to progress to LTGs.    EVAL: Patient is a 68 y.o. male who was seen today for physical therapy evaluation and treatment for R hamstring tendinopathy. His symptoms began s/p cervical decompression surgery in 07/2022. He has been receiving therapy intermittently since that time. He presents today with bilateral hip weakness, R HS weakness, pain that is constant at an 8/10. He does not report low back pain. He does report a sensation of general weakness in Les and changes to sensation in bilat Ues and Les. PT and patient discussed long h/o therapy and what his true goals for care are. He is most interested in managing HS pain. PT also did a thorough review of his medical record. He did have NCV in 12/23, which showed some evidence of R lumbar radiculopathy. PT to further assess response to interventions to determine effectiveness of treatment. We also discussed more focal HS and glut strength. He has avoided some of this due to concern of flaring pain (however pain did not change in session today).   OBJECTIVE IMPAIRMENTS: Abnormal gait, decreased ROM, decreased strength, increased fascial restrictions, impaired sensation, and pain.   GOALS: Goals reviewed with patient? Yes  SHORT TERM GOALS: Target date: 10/05/24  The patient will be indep with initial HEP Baseline: initiated at eval Goal status: MET  2.  The patient will report reduced R HS pain to < or equal to 6/10.   Baseline:  8/10, constant Goal status: MET on 09/28/24-- 5/10  3.  The patient will improve bilat hip abduction strength to > or equal to 4/5. Baseline:  See above Goal status: ONGOING  LONG TERM GOALS: Target date: 11/05/24  The patient will be indep with progression of HEP. Baseline:  initiated at eval Goal status: INITIAL  2.  The patient will report pain < or equal to 4/10 at rest. Baseline: 8/10 Goal status: INITIAL  3.  The patient will verbalize strategies to reduce pain to manage chronic deficits. Baseline:  will instruct in self massage/ self release, and post d/c activities for pain mgmt. Goal status: INITIAL  PLAN:  PT FREQUENCY: 1-2x/week  PT DURATION: 8 weeks  PLANNED INTERVENTIONS: [id number]- PT Re-evaluation, 97110-Therapeutic exercises, 97530- Therapeutic activity, [id number]- Neuromuscular re-education, 97535- Self Care, [id number]- Manual therapy, [id number]- Gait training, [phone]- Electrical  stimulation (unattended), [id number]- Electrical stimulation (manual), 02987- Traction (mechanical), [id number] (1-2 muscles), 20561 (3+ muscles)- Dry Needling, Patient/Family education, Balance training, Stair training, and Taping  PLAN FOR NEXT SESSION: check HEP, Focal HS eccentric strengthening, isometric contract/relax, work up to bridge with knee curls, standing eccentric load. Add adductor strengthening too.  DRY NEEDLING NEXT VISIT- progress hip abduction strengthening left.   Mieshia Pepitone, PT 10/26/2024, 1:17 PM

## 2024-10-28 ENCOUNTER — Ambulatory Visit (INDEPENDENT_AMBULATORY_CARE_PROVIDER_SITE_OTHER): Payer: Self-pay

## 2024-10-28 VITALS — Ht 75.0 in | Wt 250.0 lb

## 2024-10-28 DIAGNOSIS — S76311D Strain of muscle, fascia and tendon of the posterior muscle group at thigh level, right thigh, subsequent encounter: Secondary | ICD-10-CM

## 2024-10-28 NOTE — Progress Notes (Signed)
   Subjective:    Patient ID: Alex West, male    DOB: 68 y.o., [birth year]   MRN: [id number]  Chief Complaint: R hamstring tendinopathy Shockwave Treatment #3 Still working with physical therapy in [place]  Has positive NSAIDs and alcohol since last week Felt great 2 days following last treatment Pain has returned some though.    Objective:   There were no vitals filed for this visit.  R leg: TTP in the body of the hamstring muscle with slight predominance medially.  Extracorporeal Shockwave Therapy Procedure Following the description of risks including pain, bruising, local skin irritation, damage to surrounding structures, patient provided verbal/written consent for ESWT procedure. Palpation was used to identify the R hamstring veil of semimembranosus. Patient and probe was sterilely prepped in the usual fashion with alcohol.  Total strikes: 2000 Intensity: 110 Frequency: 12  Patient tolerated well without complication. Precautions provided.      Assessment & Plan:   Assessment & Plan Right hamstring tendinopathy   Continued pain despite pain-free interval following initial treatment.  Counseled on holding alcohol and NSAIDs to optimize shockwave therapy effect.  Treatment #3 provided today.  Follow-up in 1 week for treatment 4.

## 2024-11-02 ENCOUNTER — Encounter: Payer: Self-pay | Admitting: Rehabilitative and Restorative Service Providers"

## 2024-11-02 ENCOUNTER — Ambulatory Visit: Admitting: Rehabilitative and Restorative Service Providers"

## 2024-11-02 DIAGNOSIS — M79651 Pain in right thigh: Secondary | ICD-10-CM

## 2024-11-02 DIAGNOSIS — R2689 Other abnormalities of gait and mobility: Secondary | ICD-10-CM

## 2024-11-02 DIAGNOSIS — M6281 Muscle weakness (generalized): Secondary | ICD-10-CM | POA: Diagnosis not present

## 2024-11-02 NOTE — Therapy (Addendum)
 " OUTPATIENT PHYSICAL THERAPY LOWER EXTREMITY TREATMENT AND D/C SUMMARY   Patient Name: Alex West MRN: [id number] DOB:1956-05-05, 68 y.o., male Today's Date: 11/02/2024   PHYSICAL THERAPY DISCHARGE SUMMARY  Visits from Start of Care: 9  Current functional level related to goals / functional outcomes: See below for last known status--patient did not return since last visit to check goals   Remaining deficits: See below   Education / Equipment: HEP, gym routine   Patient agrees to discharge. Patient goals were partially met. Patient is being discharged due to not returning since the last visit.  END OF SESSION:  PT End of Session - 11/02/24 1318     Visit Number 9    Number of Visits 16    Date for Recertification  11/05/24    Authorization Type [employer]    Progress Note Due on Visit 10    PT Start Time 1318    PT Stop Time 1400    PT Time Calculation (min) 42 min    Activity Tolerance Patient tolerated treatment well    Behavior During Therapy WFL for tasks assessed/performed           Past Medical History:  Diagnosis Date   Arthritis    BPH (benign prostatic hyperplasia)    Colon polyp    ED (erectile dysfunction)    Elevated liver enzymes    Gallstones    Hepatitis    History of anemia    Hypercholesterolemia    Hypothyroidism    Mild tricuspid regurgitation    Mitral valve regurgitation    Thyroid  disease    Vertigo    Past Surgical History:  Procedure Laterality Date   ANTERIOR CERVICAL DECOMP/DISCECTOMY FUSION N/A 07/23/2022   Procedure: Anterior Cervical Discectomy and Fusion Cervical Three-Four/Four-Five/Five-Six;  Surgeon: Debby Dorn MATSU, MD;  Location: [facility] OR;  Service: Neurosurgery;  Laterality: N/A;  3C   FLEXIBLE SIGMOIDOSCOPY     TOTAL HIP ARTHROPLASTY Left 01/06/2023   Procedure: LEFT TOTAL HIP ARTHROPLASTY ANTERIOR APPROACH;  Surgeon: Liam Lerner, MD;  Location: [facility] ORS;  Service: Orthopedics;  Laterality: Left;   Patient Active  Problem List   Diagnosis Date Noted   UTI (urinary tract infection) 01/24/2023   Sepsis (HCC) 01/24/2023   Bacteremia 01/24/2023   AKI (acute kidney injury) 01/23/2023   Arthritis of left hip 01/01/2023   Paresthesia 08/21/2022   Stenosis of cervical spine with myelopathy (HCC) 07/23/2022   Gait abnormality 06/18/2022   Cervical myelopathy (HCC) 06/17/2022   Debility 06/06/2022   Neck pain 06/06/2022   OA (osteoarthritis) of hip 07/24/2021   Immunosuppressed status 09/05/2014   Hepatitis, autoimmune (HCC) 09/05/2014    PCP: Ladonna Dale Gull, MD REFERRING PROVIDER: Redell Robes, DO REFERRING DIAG: [phone] (ICD-10-CM) - Hamstring injury, right, initial encounter   THERAPY DIAG:  Pain in right thigh  Muscle weakness (generalized)  Other abnormalities of gait and mobility  Right hamstring tendinopathy. Chronic, ongoing for 2 years. Please use dry needling.   Rationale for Evaluation and Treatment: Rehabilitation  ONSET DATE: 09/01/24  SUBJECTIVE:   SUBJECTIVE STATEMENT: The patient notes he was sore after last shockwave therapy last week-- he was sore. He did too much last week with therapy and got muscle soreness that then turns into nerve pain due to neuropathy. The cold weather flares up pain.   EVAL: The patient is s/p cervical spine surgery 07/2022. He began at that time with R hamstring pain. He was also dx with peripheral neuropathy. He reports  his pain is focal to R hamstring distal to IT and above the knee. He reports neuropathy is from bilateral shoulders down into hands and legs from waist down into feet are burning. It's almost uncomfortable to keep shoes on.  He reports general weakness since his neck surgery.  He recently finished at [facility] PT-- he has been getting PT off and on since surgery.  He recalls an immediate sensation of walking in quick sand after neck surgery.  He feels like he is losing some strength in HS muscle. His primary goal is reducing  pain. He wants to try dry needling.  PERTINENT HISTORY: 07/2022 cervical spine surgery, 12/2022 L hip replacement.  PAIN:  Are you having pain? Yes: NPRS scale: 7/10  Pain location: R posterior thigh Pain description: pain and weakness Aggravating factors: mostly with walking Relieving factors: Cream, patches (quali)  a sleeve  Other pain: shoulders into arms/fingers and hips down numbness and weakness in all extremities. No neck pain  PRECAUTIONS: None  RED FLAGS: The patient reports constipation with bowels since surgery and frequent urination with bladder, no incontinence.    WEIGHT BEARING RESTRICTIONS: No  FALLS:  Has patient fallen in last 6 months? Yes. Number of falls 1-- leg buckled  LIVING ENVIRONMENT: Lives with: lives with their spouse Lives in: House/apartment Stairs: can live on one level but has steps to home gym-- exercises daily M-F Has following equipment at home: Single point cane  OCCUPATION: retired  PLOF: Independent with numbness/tingling R arm (into last 2 fingers). Modified indep with cane since the surgery 8/23.  PATIENT GOALS: Getting rid of pain, gain strength and balance.   OBJECTIVE:  Note: Objective measures were completed at Evaluation unless otherwise noted.  DIAGNOSTIC FINDINGS:  He had severe cervical stenosis including myelomalacia and severe stenosis at C3-4, moderate-to-severe stenosis at C4-5, and severe stenosis at C5-6.   NCV with EMG 11/2022: Conclusion: This is an abnormal study.  There is electrodiagnostic evidence of chronic mild right C5-6-7 cervical radiculopathy, and also chronic right L4-5 S1 lumbar radiculopathy.  There is no evidence of large fiber peripheral neuropathy, or upper extremity focal neuropathy.   PATIENT SURVEYS:  Neuro and ortho diagnoses  COGNITION: Overall cognitive status: Within functional limits for tasks assessed     SENSATION: abnormality in sensation t/o bilat Ues and Les   PALPATION:            No specific tenderness noted with palpation today--- pain stays consistent t/o session  LOWER EXTREMITY ROM: Dec'd L hip ER as compared to R  LOWER EXTREMITY MMT: MMT Right eval Left eval  Hip flexion 5/5 5/5  Hip extension 3/5 3/5  Hip abduction 3+/5 3/5  Hip adduction    Hip internal rotation 4/5 4/5  Hip external rotation 4/5 3/5  Knee flexion 5/5 5/5  Knee extension 5/5 5/5  Ankle dorsiflexion 5/5 5/5  Ankle plantarflexion    Ankle inversion    Ankle eversion     (Blank rows = not tested)  GAIT: Distance walked: Gait with SPC into/out of clinic with slow speed-- Cane utilized on R UE. Assistive device utilized: Single point cane Level of assistance: Modified independence         [facility] Adult PT Treatment:  DATE: 11/02/24 Manual Therapy: STM R hamstring and R adductor tendon Dry Needling: Trigger Point Dry Needling Treatment: Pre-treatment instruction: Patient instructed on dry needling rationale, procedures, and possible side effects including pain during treatment (achy,cramping feeling), bruising, drop of blood, lightheadedness, nausea, sweating. Patient Consent Given: Yes Education handout provided: Previously provided Muscles treated: R hamstring -- positioned in sidelying R with L knee flexed to access medial border of HS on R leg  Treatment response/outcome: Palpable decrease in muscle tension Therapeutic Activity: Standing marching + holds Standing T position R LE for glut activation x 5 reps Warm up on treadmill 2.4 mph x 3% incline x 5 minutes with UE support Pain increases with wallking on treadmill in R hamstring    [facility] Adult PT Treatment:                                                DATE: 10/26/24 Therapeutic Exercise: Sidelying hip abduction L side x 20 reps and R side 15 reps Therapeutic Activity: Side plank modified with knees bent T reach single leg x 10 reps x R and L sides Single limb stance  activities maintaining level pelvis Split lunges x 10 reps R and L x 1 sets Split lunges with reach to cone x 10 reps R and L Gait: Gait on tradmill x 6 minutes 2.2 mph x 3% incline Gait is characterized by throwing the foot out to hit heel strike and bilat trendelenberg gait Gait with mirror watching hip trendelenberg to demo to patient why sensation of instability can be present with hip weakness    [facility] Adult PT Treatment:                                                DATE: 10/19/24 Manual Therapy: IASTM R hamstring musculature and STM + adductors Therapeutic Activity: Supine hamstring bridges x 5 reps with toes lifted  Bridges with holds and alternating LE kicks x 8 reps Bridges with ball rolling in to chest for HS x 6 reps x 2 sets  Single leg dead lift R and L x 10 reps Askling protocol gliders x 8 reps R and L  Dry Needling: Trigger Point Dry Needling Treatment: Pre-treatment instruction: Patient instructed on dry needling rationale, procedures, and possible side effects including pain during treatment (achy,cramping feeling), bruising, drop of blood, lightheadedness, nausea, sweating. Patient Consent Given: Yes Education handout provided: Previously provided Muscles treated: R hamstring 2 needles  Electrical stimulation performed: No Treatment response/outcome: Palpable decrease in muscle tension Post-treatment instructions: Patient instructed to expect possible mild to moderate muscle soreness later today and/or tomorrow. Patient instructed in methods to reduce muscle soreness and to continue prescribed HEP. If patient was dry needled over the lung field, patient was instructed on signs and symptoms of pneumothorax and, however unlikely, to see immediate medical attention should they occur. Patient was also educated on signs and symptoms of infection and to seek medical attention should they occur. Patient verbalized understanding of these instructions and education.   [facility]  Adult PT Treatment:  DATE: 10/12/24 Therapeutic Exercise: Sidelying hip adduction x 10 reps R and L sides Prone knee flexion with 4# ankle weights x 20 reps to fatigue HS (to determine if cramping improves), then repeated on L side Neuromuscular re-ed: Single leg standing and clock reach R and L  Self release of muscle tightness using massage ball Therapeutic Activity: Quadriped hip rotation on yoga block x 10 reps R and L sides Quadriped to down dog  x 6 reps Modified side plank x 10 reps R and L sides Single leg dead lift in stages (wall reaches, then reaching to mat, then using railing on treadmill) -- more challenging on L side   [facility] Adult PT Treatment:                                                DATE: 10/05/24 Manual Therapy: STM R hamstring and IASTM STM R adductors Dry needling: .Trigger Point Dry Needling Treatment: Pre-treatment instruction: Patient instructed on dry needling rationale, procedures, and possible side effects including pain during treatment (achy,cramping feeling), bruising, drop of blood, lightheadedness, nausea, sweating. Patient Consent Given: Yes Education handout provided: Previously provided Muscles treated: R hamstrings x 2 needles  Treatment response/outcome: Palpable decrease in muscle tension Post-treatment instructions: Patient instructed to expect possible mild to moderate muscle soreness later today and/or tomorrow. Patient instructed in methods to reduce muscle soreness and to continue prescribed HEP. If patient was dry needled over the lung field, patient was instructed on signs and symptoms of pneumothorax and, however unlikely, to see immediate medical attention should they occur. Patient was also educated on signs and symptoms of infection and to seek medical attention should they occur. Patient verbalized understanding of these instructions and education. Therapeutic Activity: Quadriped <> down  dog Quadriped hip extension + adding knee flexion Lateral step ups adding R hip abduction x 10 reps with L UE support, then repeat on R side lateral step up Single leg dead lift in stages (wall reaches, then reaching to mat, then using railing on treadmill) -- more challenging on L side  [facility] Adult PT Treatment:                                                DATE: 09/28/24 Therapeutic Exercise: Seated Seated heel slide (internally rotates L leg) Seated hip ER-- attempting stretch turned to the side with leg on mat Supine Stretch out strap HS x 2 reps Stretch out strap with adductor stretch + medial HS  Manual Therapy: STM R hamstring and R hip adductors Gait: Gait without device-- note L hip weakness and L foot moving into pronation leading to knee valgus-- we discussed strengthening the L LE in order to improve gait Dry Needling: Trigger Point Dry Needling Treatment: Pre-treatment instruction: Patient instructed on dry needling rationale, procedures, and possible side effects including pain during treatment (achy,cramping feeling), bruising, drop of blood, lightheadedness, nausea, sweating. Patient Consent Given: Yes Education handout provided: Yes Muscles treated: R medial HS and R adductor  Treatment response/outcome: Palpable decrease in muscle tension Post-treatment instructions: Patient instructed to expect possible mild to moderate muscle soreness later today and/or tomorrow. Patient instructed in methods to reduce muscle soreness and to continue prescribed HEP. If patient was  dry needled over the lung field, patient was instructed on signs and symptoms of pneumothorax and, however unlikely, to see immediate medical attention should they occur. Patient was also educated on signs and symptoms of infection and to seek medical attention should they occur. Patient verbalized understanding of these instructions and education.               PATIENT EDUCATION:  Education details: HEP, dry  needling eduction (need to provide handout), PT plan of care Person educated: Patient Education method: Explanation, Demonstration, and Handouts Education comprehension: verbalized understanding and returned demonstration  [facility] program: Access Code: [email or web address] URL: [email or web address] Date: 10/27/2024 Prepared by: Tawni Ferrier  Exercises - Sidelying Hip Abduction  - 1 x daily - 4 x weekly - 2 sets - 15 reps - Prone Hip Extension  - 1 x daily - 4 x weekly - 1 sets - 10 reps - Prone Knee Flexion AROM  - 1 x daily - 4 x weekly - 1 sets - 10 reps - Bird Dog  - 1 x daily - 5 x weekly - 1 sets - 10 reps - Side Plank on Knees  - 1 x daily - 5 x weekly - 1 sets - 10 reps - Split Squats Forward Trunk  - 2 x daily - 7 x weekly - 1 sets - 10 reps - Lateral Lunge Adductor Stretch with Counter Support  - 1 x daily - 5 x weekly - 1 sets - 2 reps - 30 seconds hold - Hamstring Mobilization on Foam Roll  - 1 x daily - 4 x weekly - 1 sets - 1 reps - Calf Mobilization with Small Ball  - 1 x daily - 4 x weekly - 1 sets - 1 reps - 2 minutes hold - Hip Adductors and Hamstring Stretch with Strap  - 1 x daily - 5 x weekly - 1 sets - 2 reps - 30 seconds hold  ASSESSMENT: CLINICAL IMPRESSION: PT and the patient discussed continuation of HEP for hip strengthening and hamstring isolation. He is continuing with increased pain level in the past couple of weeks that does not immediately reduce today with dry needling. PT encouraged continuation of strengthening activities emphasizing chronic nature of condition and working towards self mgmt of symptoms.  The patient is not meeting LTG for pain level. PT to re-assess next week and determine what updates are needed to plan of care.   EVAL: Patient is a 68 y.o. male who was seen today for physical therapy evaluation and treatment for R hamstring tendinopathy. His symptoms began s/p cervical decompression surgery in 07/2022. He has been receiving therapy  intermittently since that time. He presents today with bilateral hip weakness, R HS weakness, pain that is constant at an 8/10. He does not report low back pain. He does report a sensation of general weakness in Les and changes to sensation in bilat Ues and Les. PT and patient discussed long h/o therapy and what his true goals for care are. He is most interested in managing HS pain. PT also did a thorough review of his medical record. He did have NCV in 12/23, which showed some evidence of R lumbar radiculopathy. PT to further assess response to interventions to determine effectiveness of treatment. We also discussed more focal HS and glut strength. He has avoided some of this due to concern of flaring pain (however pain did not change in session today).   OBJECTIVE IMPAIRMENTS: Abnormal gait, decreased ROM, decreased strength, increased fascial  restrictions, impaired sensation, and pain.   GOALS: Goals reviewed with patient? Yes  SHORT TERM GOALS: Target date: 10/05/24  The patient will be indep with initial HEP Baseline: initiated at eval Goal status: MET  2.  The patient will report reduced R HS pain to < or equal to 6/10.  Baseline:  8/10, constant Goal status: MET on 09/28/24-- 5/10  3.  The patient will improve bilat hip abduction strength to > or equal to 4/5. Baseline:  See above Goal status: ONGOING  LONG TERM GOALS: Target date: 11/05/24  The patient will be indep with progression of HEP. Baseline:  initiated at eval Goal status: INITIAL  2.  The patient will report pain < or equal to 4/10 at rest. Baseline: 8/10 Goal status: NOT MET   3.  The patient will verbalize strategies to reduce pain to manage chronic deficits. Baseline:  will instruct in self massage/ self release, and post d/c activities for pain mgmt. Goal status: INITIAL  PLAN:  PT FREQUENCY: 1-2x/week  PT DURATION: 8 weeks  PLANNED INTERVENTIONS: [id number]- PT Re-evaluation, 97110-Therapeutic exercises,  97530- Therapeutic activity, [id number]- Neuromuscular re-education, 97535- Self Care, [id number]- Manual therapy, [id number]- Gait training, [phone]- Electrical stimulation (unattended), [phone]- Electrical stimulation (manual), [id number]- Traction (mechanical), 20560 (1-2 muscles), 20561 (3+ muscles)- Dry Needling, Patient/Family education, Balance training, Stair training, and Taping  PLAN FOR NEXT SESSION: check HEP, Focal HS eccentric strengthening, isometric contract/relax, work up to bridge with knee curls, standing eccentric load. Add adductor strengthening too.  Check HEP and reassess LTGs.   Tyauna Lacaze, PT 11/02/2024, 2:59 PM  "

## 2024-11-04 ENCOUNTER — Encounter

## 2024-11-04 NOTE — Progress Notes (Signed)
 Erroneous encounter due to cancellation or no-show. Patient was not seen.

## 2024-11-09 ENCOUNTER — Ambulatory Visit: Admitting: Rehabilitative and Restorative Service Providers"

## 2024-11-09 NOTE — Therapy (Deleted)
 OUTPATIENT PHYSICAL THERAPY LOWER EXTREMITY TREATMENT   Patient Name: Yeudiel Mateo MRN: [id number] DOB:Apr 09, 1956, 68 y.o., male Today's Date: 11/09/2024  END OF SESSION:     Past Medical History:  Diagnosis Date   Arthritis    BPH (benign prostatic hyperplasia)    Colon polyp    ED (erectile dysfunction)    Elevated liver enzymes    Gallstones    Hepatitis    History of anemia    Hypercholesterolemia    Hypothyroidism    Mild tricuspid regurgitation    Mitral valve regurgitation    Thyroid  disease    Vertigo    Past Surgical History:  Procedure Laterality Date   ANTERIOR CERVICAL DECOMP/DISCECTOMY FUSION N/A 07/23/2022   Procedure: Anterior Cervical Discectomy and Fusion Cervical Three-Four/Four-Five/Five-Six;  Surgeon: Debby Dorn MATSU, MD;  Location: [facility] OR;  Service: Neurosurgery;  Laterality: N/A;  3C   FLEXIBLE SIGMOIDOSCOPY     TOTAL HIP ARTHROPLASTY Left 01/06/2023   Procedure: LEFT TOTAL HIP ARTHROPLASTY ANTERIOR APPROACH;  Surgeon: Liam Lerner, MD;  Location: [facility] ORS;  Service: Orthopedics;  Laterality: Left;   Patient Active Problem List   Diagnosis Date Noted   UTI (urinary tract infection) 01/24/2023   Sepsis (HCC) 01/24/2023   Bacteremia 01/24/2023   AKI (acute kidney injury) 01/23/2023   Arthritis of left hip 01/01/2023   Paresthesia 08/21/2022   Stenosis of cervical spine with myelopathy (HCC) 07/23/2022   Gait abnormality 06/18/2022   Cervical myelopathy (HCC) 06/17/2022   Debility 06/06/2022   Neck pain 06/06/2022   OA (osteoarthritis) of hip 07/24/2021   Immunosuppressed status 09/05/2014   Hepatitis, autoimmune (HCC) 09/05/2014    PCP: Ladonna Dale Gull, MD REFERRING PROVIDER: Redell Robes, DO REFERRING DIAG: [phone] (ICD-10-CM) - Hamstring injury, right, initial encounter   THERAPY DIAG:  No diagnosis found.  Right hamstring tendinopathy. Chronic, ongoing for 2 years. Please use dry needling.   Rationale for Evaluation and  Treatment: Rehabilitation  ONSET DATE: 09/01/24  SUBJECTIVE:   SUBJECTIVE STATEMENT: ***The patient notes he was sore after last shockwave therapy last week-- he was sore. He did too much last week with therapy and got muscle soreness that then turns into nerve pain due to neuropathy. The cold weather flares up pain.   EVAL: The patient is s/p cervical spine surgery 07/2022. He began at that time with R hamstring pain. He was also dx with peripheral neuropathy. He reports his pain is focal to R hamstring distal to IT and above the knee. He reports neuropathy is from bilateral shoulders down into hands and legs from waist down into feet are burning. It's almost uncomfortable to keep shoes on.  He reports general weakness since his neck surgery.  He recently finished at [facility]-- he has been getting PT off and on since surgery.  He recalls an immediate sensation of walking in quick sand after neck surgery.  He feels like he is losing some strength in HS muscle. His primary goal is reducing pain. He wants to try dry needling.  PERTINENT HISTORY: 07/2022 cervical spine surgery, 12/2022 L hip replacement.  PAIN:  Are you having pain? Yes: NPRS scale: 7/10  Pain location: R posterior thigh Pain description: pain and weakness Aggravating factors: mostly with walking Relieving factors: Cream, patches (quali)  a sleeve  Other pain: shoulders into arms/fingers and hips down numbness and weakness in all extremities. No neck pain  PRECAUTIONS: None  RED FLAGS: The patient reports constipation with bowels since surgery and frequent  urination with bladder, no incontinence.    WEIGHT BEARING RESTRICTIONS: No  FALLS:  Has patient fallen in last 6 months? Yes. Number of falls 1-- leg buckled  LIVING ENVIRONMENT: Lives with: lives with their spouse Lives in: House/apartment Stairs: can live on one level but has steps to home gym-- exercises daily M-F Has following equipment at home: Single  point cane  OCCUPATION: retired  PLOF: Independent with numbness/tingling R arm (into last 2 fingers). Modified indep with cane since the surgery 8/23.  PATIENT GOALS: Getting rid of pain, gain strength and balance.   OBJECTIVE:  Note: Objective measures were completed at Evaluation unless otherwise noted.  DIAGNOSTIC FINDINGS:  He had severe cervical stenosis including myelomalacia and severe stenosis at C3-4, moderate-to-severe stenosis at C4-5, and severe stenosis at C5-6.   NCV with EMG 11/2022: Conclusion: This is an abnormal study.  There is electrodiagnostic evidence of chronic mild right C5-6-7 cervical radiculopathy, and also chronic right L4-5 S1 lumbar radiculopathy.  There is no evidence of large fiber peripheral neuropathy, or upper extremity focal neuropathy.   PATIENT SURVEYS:  Neuro and ortho diagnoses  COGNITION: Overall cognitive status: Within functional limits for tasks assessed     SENSATION: abnormality in sensation t/o bilat Ues and Les   PALPATION:           No specific tenderness noted with palpation today--- pain stays consistent t/o session  LOWER EXTREMITY ROM: Dec'd L hip ER as compared to R  LOWER EXTREMITY MMT: MMT Right eval Left eval  Hip flexion 5/5 5/5  Hip extension 3/5 3/5  Hip abduction 3+/5 3/5  Hip adduction    Hip internal rotation 4/5 4/5  Hip external rotation 4/5 3/5  Knee flexion 5/5 5/5  Knee extension 5/5 5/5  Ankle dorsiflexion 5/5 5/5  Ankle plantarflexion    Ankle inversion    Ankle eversion     (Blank rows = not tested)  GAIT: Distance walked: Gait with SPC into/out of clinic with slow speed-- Cane utilized on R UE. Assistive device utilized: Single point cane Level of assistance: Modified independence          [facility] Adult PT Treatment:                                                DATE: 11/09/24 Therapeutic Exercise: *** Manual Therapy: *** Neuromuscular re-ed: *** Therapeutic  Activity: *** Gait: *** Modalities: *** Self Care: ***   [facility] Adult PT Treatment:                                                DATE: 11/02/24 Manual Therapy: STM R hamstring and R adductor tendon Dry Needling: Trigger Point Dry Needling Treatment: Pre-treatment instruction: Patient instructed on dry needling rationale, procedures, and possible side effects including pain during treatment (achy,cramping feeling), bruising, drop of blood, lightheadedness, nausea, sweating. Patient Consent Given: Yes Education handout provided: Previously provided Muscles treated: R hamstring -- positioned in sidelying R with L knee flexed to access medial border of HS on R leg  Treatment response/outcome: Palpable decrease in muscle tension Therapeutic Activity: Standing marching + holds Standing T position R LE for glut activation x 5 reps Warm up on treadmill  2.4 mph x 3% incline x 5 minutes with UE support Pain increases with wallking on treadmill in R hamstring    [facility] Adult PT Treatment:                                                DATE: 10/26/24 Therapeutic Exercise: Sidelying hip abduction L side x 20 reps and R side 15 reps Therapeutic Activity: Side plank modified with knees bent T reach single leg x 10 reps x R and L sides Single limb stance activities maintaining level pelvis Split lunges x 10 reps R and L x 1 sets Split lunges with reach to cone x 10 reps R and L Gait: Gait on tradmill x 6 minutes 2.2 mph x 3% incline Gait is characterized by throwing the foot out to hit heel strike and bilat trendelenberg gait Gait with mirror watching hip trendelenberg to demo to patient why sensation of instability can be present with hip weakness    [facility] Adult PT Treatment:                                                DATE: 10/19/24 Manual Therapy: IASTM R hamstring musculature and STM + adductors Therapeutic Activity: Supine hamstring bridges x 5 reps with toes lifted  Bridges with  holds and alternating LE kicks x 8 reps Bridges with ball rolling in to chest for HS x 6 reps x 2 sets  Single leg dead lift R and L x 10 reps Askling protocol gliders x 8 reps R and L  Dry Needling: Trigger Point Dry Needling Treatment: Pre-treatment instruction: Patient instructed on dry needling rationale, procedures, and possible side effects including pain during treatment (achy,cramping feeling), bruising, drop of blood, lightheadedness, nausea, sweating. Patient Consent Given: Yes Education handout provided: Previously provided Muscles treated: R hamstring 2 needles  Electrical stimulation performed: No Treatment response/outcome: Palpable decrease in muscle tension Post-treatment instructions: Patient instructed to expect possible mild to moderate muscle soreness later today and/or tomorrow. Patient instructed in methods to reduce muscle soreness and to continue prescribed HEP. If patient was dry needled over the lung field, patient was instructed on signs and symptoms of pneumothorax and, however unlikely, to see immediate medical attention should they occur. Patient was also educated on signs and symptoms of infection and to seek medical attention should they occur. Patient verbalized understanding of these instructions and education.              PATIENT EDUCATION:  Education details: HEP, dry needling eduction (need to provide handout), PT plan of care Person educated: Patient Education method: Explanation, Demonstration, and Handouts Education comprehension: verbalized understanding and returned demonstration  HOMe program: Access Code: [email or web address] URL: [email or web address] Date: 10/27/2024 Prepared by: Tawni Ferrier  Exercises - Sidelying Hip Abduction  - 1 x daily - 4 x weekly - 2 sets - 15 reps - Prone Hip Extension  - 1 x daily - 4 x weekly - 1 sets - 10 reps - Prone Knee Flexion AROM  - 1 x daily - 4 x weekly - 1 sets - 10 reps - Bird Dog  - 1 x daily -  5 x weekly - 1 sets - 10 reps - Side Plank  on Knees  - 1 x daily - 5 x weekly - 1 sets - 10 reps - Split Squats Forward Trunk  - 2 x daily - 7 x weekly - 1 sets - 10 reps - Lateral Lunge Adductor Stretch with Counter Support  - 1 x daily - 5 x weekly - 1 sets - 2 reps - 30 seconds hold - Hamstring Mobilization on Foam Roll  - 1 x daily - 4 x weekly - 1 sets - 1 reps - Calf Mobilization with Small Ball  - 1 x daily - 4 x weekly - 1 sets - 1 reps - 2 minutes hold - Hip Adductors and Hamstring Stretch with Strap  - 1 x daily - 5 x weekly - 1 sets - 2 reps - 30 seconds hold  ASSESSMENT: CLINICAL IMPRESSION: PT and the patient discussed continuation of HEP for hip strengthening and hamstring isolation. He is continuing with increased pain level in the past couple of weeks that does not immediately reduce today with dry needling. PT encouraged continuation of strengthening activities emphasizing chronic nature of condition and working towards self mgmt of symptoms.  The patient is not meeting LTG for pain level. PT to re-assess next week and determine what updates are needed to plan of care.   EVAL: Patient is a 68 y.o. male who was seen today for physical therapy evaluation and treatment for R hamstring tendinopathy. His symptoms began s/p cervical decompression surgery in 07/2022. He has been receiving therapy intermittently since that time. He presents today with bilateral hip weakness, R HS weakness, pain that is constant at an 8/10. He does not report low back pain. He does report a sensation of general weakness in Les and changes to sensation in bilat Ues and Les. PT and patient discussed long h/o therapy and what his true goals for care are. He is most interested in managing HS pain. PT also did a thorough review of his medical record. He did have NCV in 12/23, which showed some evidence of R lumbar radiculopathy. PT to further assess response to interventions to determine effectiveness of treatment. We  also discussed more focal HS and glut strength. He has avoided some of this due to concern of flaring pain (however pain did not change in session today).   OBJECTIVE IMPAIRMENTS: Abnormal gait, decreased ROM, decreased strength, increased fascial restrictions, impaired sensation, and pain.   GOALS: Goals reviewed with patient? Yes  SHORT TERM GOALS: Target date: 10/05/24  The patient will be indep with initial HEP Baseline: initiated at eval Goal status: MET  2.  The patient will report reduced R HS pain to < or equal to 6/10.  Baseline:  8/10, constant Goal status: MET on 09/28/24-- 5/10  3.  The patient will improve bilat hip abduction strength to > or equal to 4/5. Baseline:  See above Goal status: ONGOING  LONG TERM GOALS: Target date: 11/05/24  The patient will be indep with progression of HEP. Baseline:  initiated at eval Goal status: INITIAL  2.  The patient will report pain < or equal to 4/10 at rest. Baseline: 8/10 Goal status: NOT MET   3.  The patient will verbalize strategies to reduce pain to manage chronic deficits. Baseline:  will instruct in self massage/ self release, and post d/c activities for pain mgmt. Goal status: INITIAL  PLAN:  PT FREQUENCY: 1-2x/week  PT DURATION: 8 weeks  PLANNED INTERVENTIONS: 97164- PT Re-evaluation, 97110-Therapeutic exercises, 97530- Therapeutic activity, [id number]- Neuromuscular re-education, 97535-  Self Care, [id number]- Manual therapy, [id number]- Gait training, [phone]- Electrical stimulation (unattended), [phone]- Electrical stimulation (manual), [id number]- Traction (mechanical), [phone] (1-2 muscles), 20561 (3+ muscles)- Dry Needling, Patient/Family education, Balance training, Stair training, and Taping  PLAN FOR NEXT SESSION: check HEP, Focal HS eccentric strengthening, isometric contract/relax, work up to bridge with knee curls, standing eccentric load. Add adductor strengthening too.  Check HEP and reassess LTGs.   Aliegha Paullin,  PT 11/09/2024, 12:39 PM

## 2024-11-11 ENCOUNTER — Ambulatory Visit (INDEPENDENT_AMBULATORY_CARE_PROVIDER_SITE_OTHER)

## 2024-11-11 VITALS — BP 110/80 | Ht 75.0 in | Wt 250.0 lb

## 2024-11-11 DIAGNOSIS — Z8739 Personal history of other diseases of the musculoskeletal system and connective tissue: Secondary | ICD-10-CM | POA: Diagnosis not present

## 2024-11-11 DIAGNOSIS — S76311D Strain of muscle, fascia and tendon of the posterior muscle group at thigh level, right thigh, subsequent encounter: Secondary | ICD-10-CM | POA: Diagnosis not present

## 2024-11-11 DIAGNOSIS — M79604 Pain in right leg: Secondary | ICD-10-CM

## 2024-11-11 DIAGNOSIS — S76301D Unspecified injury of muscle, fascia and tendon of the posterior muscle group at thigh level, right thigh, subsequent encounter: Secondary | ICD-10-CM | POA: Diagnosis not present

## 2024-11-11 DIAGNOSIS — G8929 Other chronic pain: Secondary | ICD-10-CM

## 2024-11-11 NOTE — Progress Notes (Signed)
   Subjective:    Patient ID: Alex West, male    DOB: 68 y.o., [birth year]   MRN: [id number]  Chief Complaint: Right hamstring injury follow-up  Discussed the use of AI scribe software for clinical note transcription with the patient, who gave verbal consent to proceed.  History of Present Illness Alex West is a 68 year old male with chronic leg pain and weakness who presents with worsening symptoms and difficulty walking.  Lower extremity pain and weakness - Chronic bilateral leg pain, particularly in the thighs, described as a burning sensation - Burning pain worsens with physical exertion, such as cycling - Leg weakness with a plateau in strength despite exercise - Difficulty walking due to pain and weakness, with increased symptoms in colder weather - Burning sensation persists despite daily stretching and strength training exercises - Symptoms have minimally improved, with only an estimated ten percent improvement over eight weeks of treatment  Sensory disturbances - Numbness and tingling in the toes and the top of the feet, not present on the plantar surfaces - Certain types of socks exacerbate sensory symptoms  Functional limitations and impact on activities - Difficulty walking has led to changes in footwear, preferring low-profile training shoes for stability - Cautious with activity to avoid symptom flare-ups - Continues daily stretching and strength training, including leg extensions and curls, but perceives limited progress  Therapeutic interventions and response - Uses magnesium cream and patches for temporary relief, but no long-term benefit - Completed three shockwave treatment sessions and physical therapy with minimal improvement - Laser therapy resulted in soreness the day after treatment  History of musculoskeletal injury - History of hamstring issues persisting for two years, beginning after a surgery      Objective:   Vitals:    11/11/24 1031  BP: 110/80    Const: appears well, non-toxic, well groomed Psych: affect bright, interactive, smiling EENT: EOMI intact, conjunctiva appear normal Neck: no obvious masses, appears symmetric Resp: non-labored, appears symmetric Neuro: muscle bulk appears normal Skin: no obvious rashes noted  Examination of [name]'s bilateral legs was deferred today.     Assessment & Plan:   Assessment & Plan Right hamstring muscle injury with persistent pain and burning   He has experienced persistent right hamstring pain and burning for approximately two years, with minimal improvement despite physical therapy, shockwave treatments, and topical creams. Symptoms include a burning sensation in the thigh, difficulty walking, and weakness. Previous ultrasound and nerve conduction studies indicated L4, L5, and S1 radiculopathy, but symptoms do not fully align with expected dermatomal distribution. Differential includes hamstring pathology versus nerve involvement. Ordered MRI of the right thigh to assess hamstring and surrounding structures. Held off on further shockwave treatments.  Bilateral thigh burning and weakness   He reports bilateral thigh burning and weakness, exacerbated by physical activity. Symptoms include a burning sensation in both thighs, particularly during exertion, and difficulty with strength training. Symptoms may be related to nerve involvement or muscle pathology. Ordered MRI of both thighs to evaluate for underlying pathology.  L4, L5, and S1 lumbar radiculopathy with lower extremity paresthesia   L4, L5, and S1 lumbar radiculopathy confirmed by nerve conduction study in October 2023. Symptoms include numbness and tingling in toes and top of feet, with some relief from wearing specific socks. Symptoms do not fully correlate with expected dermatomal distribution, suggesting possible atypical nerve involvement.

## 2024-11-23 ENCOUNTER — Ambulatory Visit: Admitting: Rehabilitative and Restorative Service Providers"

## 2024-11-30 ENCOUNTER — Ambulatory Visit (HOSPITAL_BASED_OUTPATIENT_CLINIC_OR_DEPARTMENT_OTHER): Admission: RE | Admit: 2024-11-30 | Discharge: 2024-11-30

## 2024-11-30 DIAGNOSIS — M7602 Gluteal tendinitis, left hip: Secondary | ICD-10-CM | POA: Diagnosis not present

## 2024-11-30 DIAGNOSIS — Z96642 Presence of left artificial hip joint: Secondary | ICD-10-CM | POA: Diagnosis not present

## 2024-11-30 DIAGNOSIS — Z8739 Personal history of other diseases of the musculoskeletal system and connective tissue: Secondary | ICD-10-CM

## 2024-11-30 DIAGNOSIS — G8929 Other chronic pain: Secondary | ICD-10-CM

## 2024-11-30 DIAGNOSIS — S76301D Unspecified injury of muscle, fascia and tendon of the posterior muscle group at thigh level, right thigh, subsequent encounter: Secondary | ICD-10-CM

## 2024-11-30 DIAGNOSIS — S76311D Strain of muscle, fascia and tendon of the posterior muscle group at thigh level, right thigh, subsequent encounter: Secondary | ICD-10-CM

## 2024-11-30 DIAGNOSIS — M79652 Pain in left thigh: Secondary | ICD-10-CM | POA: Diagnosis not present

## 2024-11-30 DIAGNOSIS — M79604 Pain in right leg: Secondary | ICD-10-CM | POA: Diagnosis not present

## 2024-11-30 DIAGNOSIS — S83242A Other tear of medial meniscus, current injury, left knee, initial encounter: Secondary | ICD-10-CM | POA: Diagnosis not present

## 2024-12-02 ENCOUNTER — Ambulatory Visit (INDEPENDENT_AMBULATORY_CARE_PROVIDER_SITE_OTHER)

## 2024-12-02 VITALS — BP 104/70 | Ht 75.0 in | Wt 250.0 lb

## 2024-12-02 DIAGNOSIS — S76012A Strain of muscle, fascia and tendon of left hip, initial encounter: Secondary | ICD-10-CM | POA: Diagnosis not present

## 2024-12-02 DIAGNOSIS — M48061 Spinal stenosis, lumbar region without neurogenic claudication: Secondary | ICD-10-CM | POA: Diagnosis not present

## 2024-12-02 DIAGNOSIS — M79651 Pain in right thigh: Secondary | ICD-10-CM | POA: Diagnosis not present

## 2024-12-02 NOTE — Progress Notes (Signed)
° °  Subjective:    Patient ID: Alex West, male    DOB: 68 y.o., 1956-11-10   MRN: [id number]  Chief Complaint: MRI follow up  Discussed the use of AI scribe software for clinical note transcription with the patient, who gave verbal consent to proceed.  History of Present Illness Pt with less frequent pain than before.  Interested in pursuing shockwave first and prolotherapy alongside this.  Wants any spinal injections to be last resort.  11/30/2024 left femur MRI IMPRESSION: 1. Mild tendinosis of the left gluteus minimus tendon insertion. 2. Small vertical tear of the posterior horn medial meniscus.  11/30/2024 right femur MRI IMPRESSION: 1. No acute injury of the right femur.    Objective:   Vitals:   12/02/24 1603  BP: 104/70   Const: appears well, non-toxic, well groomed Psych: affect bright, interactive, smiling EENT: EOMI intact, conjunctiva appear normal Neck: no obvious masses, appears symmetric Resp: non-labored, appears symmetric Neuro: muscle bulk appears normal Skin: no obvious rashes noted  Extracorporeal Shockwave Therapy Procedure - #4 Following the description of risks including pain, bruising, local skin irritation, damage to surrounding structures, patient provided verbal/written consent for ESWT procedure. Palpation was used to identify the right hamstring. Patient and probe was sterilely prepped in the usual fashion with alcohol.  Total strikes: 2000 Intensity: 140 Frequency: 10  Patient tolerated well without complication. Precautions provided.     Assessment & Plan:   Assessment & Plan Alex West's right posterior thigh pain continues to be a quagmire.  MRI findings potentially suggest nerve root impingement from the spine, though focal tenderness to palpation in his hamstring continues to suggest pathology there despite a normal MRI of his thigh.  Alex West would like to pursue shockwave therapy and prolotherapy simultaneously to treat the right  hamstring which I believe is a reasonable and he Alex hold off on spinal injections per his preference and consider them a last resort.  Shockwave therapy treatment #4 provided today.  Follow-up in 1 week for shockwave therapy treatment #5 + prolotherapy injection #1.   Greater than 40 minutes were spent in face-to-face time discussing the MRI findings with the patient, and deciding upon an ultimate treatment strategy.

## 2024-12-09 ENCOUNTER — Ambulatory Visit: Payer: Self-pay

## 2024-12-09 VITALS — Ht 75.0 in | Wt 250.0 lb

## 2024-12-09 DIAGNOSIS — S76301A Unspecified injury of muscle, fascia and tendon of the posterior muscle group at thigh level, right thigh, initial encounter: Secondary | ICD-10-CM

## 2024-12-09 DIAGNOSIS — M48061 Spinal stenosis, lumbar region without neurogenic claudication: Secondary | ICD-10-CM

## 2024-12-09 NOTE — Progress Notes (Signed)
° °  Subjective:    Patient ID: Alex West, male    DOB: 68 y.o., [birth year]   MRN: [id number]  Chief Complaint: MRI follow up  Discussed the use of AI scribe software for clinical note transcription with the patient, who gave verbal consent to proceed.  History of Present Illness Pt with less frequent pain than before.  Felt fairly noticeable improvement after last week's shockwave treatment     Objective:   There were no vitals filed for this visit.  Const: appears well, non-toxic, well groomed Psych: affect bright, interactive, smiling EENT: EOMI intact, conjunctiva appear normal Neck: no obvious masses, appears symmetric Resp: non-labored, appears symmetric Neuro: muscle bulk appears normal Skin: no obvious rashes noted  Extracorporeal Shockwave Therapy Procedure - #5 Following the description of risks including pain, bruising, local skin irritation, damage to surrounding structures, patient provided verbal/written consent for ESWT procedure. Palpation was used to identify the right hamstring. Patient and probe was sterilely prepped in the usual fashion with alcohol.  Total strikes: 2000 Intensity: 140 Frequency: 10  Patient tolerated well without complication. Precautions provided.     Assessment & Plan:   Assessment & Plan [name]'s right posterior thigh pain continues to be a quagmire.  MRI findings potentially suggest nerve root impingement from the spine, though focal tenderness to palpation in his hamstring continues to suggest pathology there despite a normal MRI of his thigh. We Alex continue trialing shockwave and after discussion of prolotherapy, the pt wishes to hold on this for now given the improvement he saw last week from shockwave alone.   Shockwave therapy treatment #5 provided today.  Follow-up in 1-2 weeks for shockwave therapy treatment #6

## 2024-12-10 ENCOUNTER — Encounter: Payer: Self-pay | Admitting: Physical Medicine and Rehabilitation

## 2024-12-10 ENCOUNTER — Encounter: Attending: Physical Medicine and Rehabilitation | Admitting: Physical Medicine and Rehabilitation

## 2024-12-10 DIAGNOSIS — K592 Neurogenic bowel, not elsewhere classified: Secondary | ICD-10-CM | POA: Diagnosis present

## 2024-12-10 DIAGNOSIS — R202 Paresthesia of skin: Secondary | ICD-10-CM | POA: Insufficient documentation

## 2024-12-10 DIAGNOSIS — R269 Unspecified abnormalities of gait and mobility: Secondary | ICD-10-CM | POA: Insufficient documentation

## 2024-12-10 DIAGNOSIS — M62838 Other muscle spasm: Secondary | ICD-10-CM | POA: Insufficient documentation

## 2024-12-10 DIAGNOSIS — M792 Neuralgia and neuritis, unspecified: Secondary | ICD-10-CM | POA: Insufficient documentation

## 2024-12-10 DIAGNOSIS — G959 Disease of spinal cord, unspecified: Secondary | ICD-10-CM | POA: Diagnosis present

## 2024-12-10 DIAGNOSIS — N319 Neuromuscular dysfunction of bladder, unspecified: Secondary | ICD-10-CM | POA: Insufficient documentation

## 2024-12-10 MED ORDER — BACLOFEN 5 MG PO TABS: 5.0000 mg | ORAL_TABLET | Freq: Two times a day (BID) | ORAL | 5 refills | Status: AC

## 2024-12-10 MED ORDER — DULOXETINE HCL 20 MG PO CPEP: 20.0000 mg | ORAL_CAPSULE | Freq: Every day | ORAL | 5 refills | Status: AC

## 2024-12-10 NOTE — Patient Instructions (Signed)
 Patient is a 68 year old male with hx of Cervical myelopathy s/p C3-C6 ACDF  07/23/22- with associated spasticity and neuropathic pain.  He also has hx of hypothyroidism- on Synthroid - Autoimmune liver disease.  L hip THA 1/24 Here for evaluation of cervical myelopathy.    FOR SPASTICITY/tightness Try Baclofen- 5 mg  at bedtime as needed for spasticity- - will write Baclofen for 5 mg 2x/day as needed- if needs in AM- for tightness/locking up - it gets worse 1-2 years then stabilizes- so will not get worse anymore.  If you have an infection- a good barometer for infections- so something brewing if gets worse abruptly.   C. Take 1 hour before bed THE FIRST time- to make sure no allergic reaction.   2.   FOR NERVE PAIN- Start Duloxetine /Cymbalta  20 mg nightly x 1 week Then 40 mg nightly x 1 week  Then 60 mg nightly- for nerve pain 1% of patients can have nausea with Duloxetine - call me if needs an anti-nausea medicine. Can also cause mild dry mouth/dry eyes and mild constipation.  B. Call me at 1 month to change to 60 mg capsules C-  Add 1-2 Senna/day- which is Over the counter- also uses Senna tea as well   4. No gabapentin - sister has dementia-    5.  For bladder- Suprapubic tapping- bladder tapping-  30 seconds to 1 minute- can also turn on water - - to help relax the urinary sphincter.  These on non medication ways to help the bladder.    6. When you get back into PT- they need to focus on glutes and Hip flexors.  Right now- do squats- and hold for 3-5 seconds- do series of 10 -15- can also do one legged, but hold onto to counter to do.    7.   If bowels don't improve, will discuss suppository/and digital stimulation at next visit.   8.  Before you go to bed, lay on your belly- for 10 minutes before you go to sleep- an push up like Cobra pose  lon a pillwo- will help even more.   9.  F/U in 3 months double appt- SCI Call me if any side effects from medication

## 2024-12-10 NOTE — Progress Notes (Signed)
 "  Subjective:    Patient ID: Alex West, male    DOB: Jun 09, 1956, 68 y.o.   MRN: [id number]  HPI  Patient is a 68 year old male with hx of Cervical myelopathy s/p C3-C6 ACDF  07/23/22- with associated spasticity and neuropathic pain.  He also has hx of hypothyroidism- on Synthroid - Autoimmune liver disease.  L hip THA 1/24- Also has severe constipation- and on Linzess;  And neurogenic bowel and bladder Here for evaluation of cervical myelopathy.     Was told has peripheral neuropathy? But has bad nerve pain Burning, tingling, cold temp makes it worse- keeps something on him- like blanket, etc on him.    Tingling and numbness in hands and feet and legs real weak-  Issues with hamstrings- R side-  cramping and painful-  Walking in quick sand- legs so heavy Wanted to go in crouched posture with  posterior lean and hamstring so tight.  Balance is really off. Feels almost drunk sometimes.  Bottom of buttocks to back of proximal R knee   Laser treatment on hamstrings- some relief-   Has muscle spasms-  Alex wake him from sleep-  Has roll over with a lot of care- locks up-   Keeps cane with him- esp if gets tired Initially RW.   Prior to surgery- 1x/year, would get numbness and tingling in RUE- down to fingers-   Would get Chiropractor to adjust him- 3x/week for 1 month Last time, went to see [facility]- because getting get better.    Fall in  5/24- on vacation- LLE went out fell back  on L buttocks and to L- head hit wall so hard- hit a hole in sheet rock.    Does light yoga and stretches 2-3x/week   LFTs 22/14- in 02/2024   Bowels- takes PO meds for constipation- Linzess- Bladder- doesn't feel like empties-   Only had retrograde ejaculation on Proscar- or Rapaflo/Silodosin-  Not taking either one.     Tried: Not on meds for spasms-  Was given Gabapentin  in past- doesn't want due to dementia risk.  Mama bear cream- for nerve pain- prn- expensive- small jar $35- uses  sparingly.  Tylenol -  takes the edge off- couple times per week.    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 General activity 7 Relation with others 5 Enjoyment of life 6 What TIME of day is your pain at its worst? daytime and night Sleep (in general) Fair  Pain is worse with: walking Pain improves with: . Relief from Meds: .  use a cane how many minutes can you walk? 10 ability to climb steps?  yes do you drive?  yes  retired  bladder control problems bowel control problems weakness numbness tingling trouble walking  New pt  New pt    Family History  Problem Relation Age of Onset   Cancer Mother        pancreatic   Cancer Father        stomach   Kidney failure Brother    Other Brother        neck tumor   Hypertension Brother    Diabetes Brother    Lupus Daughter    Hypertension Sister    Other Son  boating accident   Social History   Socioeconomic History   Marital status: Married    Spouse name: Not on file   Number of children: Not on file   Years of education: Not on file   Highest education level: Not on file  Occupational History   Occupation: Sales  Tobacco Use   Smoking status: Former   Smokeless tobacco: Never   Tobacco comments:    Has 1 cigar once a month maybe  Vaping Use   Vaping status: Never Used  Substance and Sexual Activity   Alcohol use: Yes    Comment: occ   Drug use: No   Sexual activity: Not on file  Other Topics Concern   Not on file  Social History Narrative   Not on file   Social Drivers of Health   Tobacco Use: Medium Risk (12/09/2024)   Patient History    Smoking Tobacco Use: Former    Smokeless Tobacco Use: Never    Passive Exposure: Not on Surveyor, Minerals Strain: Not on file  Food Insecurity: Not on file  Transportation Needs: Not on file  Physical Activity: Not on file  Stress: Not on file  Social Connections: Not on file  Depression (EYV7-0): Not on file  Alcohol Screen: Not on file  Housing: Not on file  Utilities: Not on file  Health Literacy: Not on file   Past Surgical History:  Procedure Laterality Date   ANTERIOR CERVICAL DECOMP/DISCECTOMY FUSION N/A 07/23/2022   Procedure: Anterior Cervical Discectomy and Fusion Cervical Three-Four/Four-Five/Five-Six;  Surgeon: Debby Dorn MATSU, MD;  Location: [facility] OR;  Service: Neurosurgery;  Laterality: N/A;  3C   FLEXIBLE SIGMOIDOSCOPY     TOTAL HIP ARTHROPLASTY Left 01/06/2023   Procedure: LEFT TOTAL HIP ARTHROPLASTY ANTERIOR APPROACH;  Surgeon: Liam Lerner, MD;  Location: [facility] ORS;  Service: Orthopedics;  Laterality: Left;   Past Medical History:  Diagnosis Date   Arthritis    BPH (benign prostatic hyperplasia)    Colon polyp    ED (erectile dysfunction)    Elevated liver enzymes    Gallstones    Hepatitis    History of anemia    Hypercholesterolemia    Hypothyroidism    Mild tricuspid regurgitation    Mitral valve regurgitation    Thyroid  disease    Vertigo    There were no vitals taken for this visit.  Opioid Risk Score:   Fall Risk Score:  `1  Depression screen PHQ 2/9      No data to display            Review of Systems  Gastrointestinal:  Positive for constipation.  Musculoskeletal:  Positive for gait problem.       B/L hand and foot pain  Neurological:  Positive for weakness and numbness.  All other systems reviewed and are negative.   An entire ROS was completed and was negative except HPI    Objective:   Physical Exam  Awake, alert, appropriate, NAD; no assistive device, but usually uses cane   MSK: Deltoids 4/5; biceps, triceps, WE, grip and FA 5-/5 LE's- HF 4-/5; KE/KF/DF and PF 4+/5 to 5-/5  Neuro: 3+ DTR's in Ue's  and patella B/L- 2+ at achilles No clonus, but has hoffman's on RUE No increased tone in LE's- but MAS of 1 in Ue's Increased/more sensitive to touch C5 and downwards B/L         Assessment & Plan:    Patient is a 68 year  old male with hx of Cervical myelopathy s/p C3-C6 ACDF  07/23/22- with associated spasticity and neuropathic pain.  He also has hx of hypothyroidism- on Synthroid - Autoimmune liver disease.  L hip THA 1/24 Here for evaluation of cervical myelopathy.    FOR SPASTICITY/tightness Try Baclofen - 5 mg  at bedtime as needed for spasticity- - Alex write Baclofen  for 5 mg 2x/day as needed- if needs in AM- for tightness/locking up - it gets worse 1-2 years then stabilizes- so Alex not get worse anymore.  If you have an infection- a good barometer for infections- so something brewing if gets worse abruptly.   C. Take 1 hour before bed THE FIRST time- to make sure no allergic reaction.   2.   FOR NERVE PAIN- Start Duloxetine /Cymbalta  20 mg nightly x 1 week Then 40 mg nightly x 1 week  Then 60 mg nightly- for nerve pain 1% of patients can have nausea with Duloxetine - call me if needs an anti-nausea medicine. Can also cause mild dry mouth/dry eyes and mild constipation.  B. Call me at 1 month to change to 60 mg capsules C-  Add 1-2 Senna/day- which is Over the counter- also uses Senna tea as well   4. No gabapentin - sister has dementia-    5.  For bladder- Suprapubic tapping- bladder tapping-  30 seconds to 1 minute- can also turn on water - - to help relax the urinary sphincter.  These on non medication ways to help the bladder.    6. When you get back into PT- they need to focus on glutes and Hip flexors.  Right now- do squats- and hold for 3-5 seconds- do series of 10 -15- can also do one legged, but hold onto to counter to do.    7.   If bowels don't improve, Alex discuss suppository/and digital stimulation at next visit.   8.  Before you go to bed, lay on your belly-  for 10 minutes before you go to sleep- an push up like Cobra pose  lon a pillwo- Alex help even more.   9.  F/U in 3 months double appt- SCI Call me if any side effects from medication  I spent a total of 63   minutes on total care today- >50% coordination of care- due to went over MRI ind x2- 63 minutes on d/w pt about B/B, spasticity and nerve pain as well as weakness     "

## 2024-12-21 NOTE — Progress Notes (Unsigned)
" ° °  Subjective:    Patient ID: Alex West, male    DOB: 68 y.o., Oct 14, 1956   MRN: [id number]  Chief Complaint: Right hamstring tendon shockwave #6  History of Present Illness Slight improvement in pain.  Still fluctuates during the week.  Last treatment 2 weeks ago.    Objective:   Const: appears well, non-toxic, well groomed Psych: affect bright, interactive, smiling EENT: EOMI intact, conjunctiva appear normal Neck: no obvious masses, appears symmetric Resp: non-labored, appears symmetric Neuro: muscle bulk appears normal Skin: no obvious rashes noted  Extracorporeal Shockwave Therapy Procedure - #6 Following the description of risks including pain, bruising, local skin irritation, damage to surrounding structures, patient provided verbal/written consent for ESWT procedure. Palpation was used to identify the right hamstring. Patient and probe was sterilely prepped in the usual fashion with alcohol.  Total strikes: 2000 Intensity: 150 Frequency: 10  Patient tolerated well without complication. Precautions provided.     Assessment & Plan:   Assessment & Plan Alex West's right posterior thigh pain continues to be a quagmire.  MRI findings potentially suggest nerve root impingement from the spine, though focal tenderness to palpation in his hamstring continues to suggest pathology there despite a normal MRI of his thigh. We Alex continue trialing shockwave and after discussion of prolotherapy, the pt wishes to hold on this for now given the improvement he saw last week from shockwave alone.   Shockwave therapy #6 provided today. F/u in 1-2 weeks for shockwave therapy #7  "

## 2024-12-22 ENCOUNTER — Ambulatory Visit: Payer: Self-pay

## 2024-12-22 DIAGNOSIS — M79604 Pain in right leg: Secondary | ICD-10-CM

## 2024-12-22 DIAGNOSIS — M48061 Spinal stenosis, lumbar region without neurogenic claudication: Secondary | ICD-10-CM

## 2024-12-22 DIAGNOSIS — G8929 Other chronic pain: Secondary | ICD-10-CM

## 2024-12-22 DIAGNOSIS — S76301D Unspecified injury of muscle, fascia and tendon of the posterior muscle group at thigh level, right thigh, subsequent encounter: Secondary | ICD-10-CM

## 2024-12-29 ENCOUNTER — Ambulatory Visit: Payer: Self-pay

## 2024-12-29 DIAGNOSIS — S76311D Strain of muscle, fascia and tendon of the posterior muscle group at thigh level, right thigh, subsequent encounter: Secondary | ICD-10-CM

## 2024-12-29 NOTE — Progress Notes (Signed)
" ° °  Subjective:    Patient ID: Alex West, male    DOB: 69 y.o., Jan 20, 1956   MRN: [id number]  Chief Complaint: Right hamstring tendon shockwave #7  History of Present Illness Slight improvement in pain.  Still fluctuates during the week.  Felt like 2 days after last treatment pain was worse.  Seems to have eased off following those initial 2 days ago.    Objective:   Const: appears well, non-toxic, well groomed Psych: affect bright, interactive, smiling EENT: EOMI intact, conjunctiva appear normal Neck: no obvious masses, appears symmetric Resp: non-labored, appears symmetric Neuro: muscle bulk appears normal Skin: no obvious rashes noted  Extracorporeal Shockwave Therapy Procedure - #7 Following the description of risks including pain, bruising, local skin irritation, damage to surrounding structures, patient provided verbal/written consent for ESWT procedure. Palpation was used to identify the right hamstring. Patient and probe was sterilely prepped in the usual fashion with alcohol.  Total strikes: 2000 Intensity: 150 Frequency: 10  Patient tolerated well without complication. Precautions provided.     Assessment & Plan:   Assessment & Plan Alex West's right posterior thigh pain continues to be a quagmire.  MRI findings potentially suggest nerve root impingement from the spine, though focal tenderness to palpation in his hamstring continues to suggest pathology there despite a normal MRI of his thigh. We Alex continue trialing shockwave and after discussion of prolotherapy, the pt wishes to hold on this for now given the improvement he saw last week from shockwave alone.   Shockwave therapy #7 provided today. F/u in 1-2 weeks for shockwave therapy #8 Plan for prolotherapy after a 2-3-week hiatus following treatment #8.  "

## 2025-01-05 ENCOUNTER — Ambulatory Visit (INDEPENDENT_AMBULATORY_CARE_PROVIDER_SITE_OTHER): Payer: Self-pay

## 2025-01-05 DIAGNOSIS — R29898 Other symptoms and signs involving the musculoskeletal system: Secondary | ICD-10-CM

## 2025-01-05 DIAGNOSIS — S76301D Unspecified injury of muscle, fascia and tendon of the posterior muscle group at thigh level, right thigh, subsequent encounter: Secondary | ICD-10-CM

## 2025-01-05 NOTE — Progress Notes (Signed)
" ° °  Subjective:    Patient ID: Alex West, male    DOB: 69 y.o., [birth year]   MRN: [id number]  Chief Complaint: Right hamstring tendon shockwave #8 + bilateral hip weakness  History of Present Illness Alex West is a 68 year old male with chronic right hamstring injury, bilateral hip flexor weakness, neuropathy, and prior right hip replacement who presents for follow-up of persistent lower extremity weakness and neuropathic symptoms.  Lower Extremity Weakness: - Persistent bilateral lower extremity weakness for over two years - Marked bilateral thigh and hip flexor weakness - Weakness most pronounced when rising from a chair or walking uphill - Able to walk indoors and on level ground, but inclines remain difficult - Legs feel heavy, floppy, and unbalanced, including when turning in bed - Symptoms present all day without fluctuation - Regularly performs hip and thigh strengthening exercises (squats, resistance bands) - Believes exercises have at least prevented further decline - Upper body strength improved with exercise after prior pectoral atrophy from surgery  Neuropathic Symptoms: - Persistent bilateral lower extremity neuropathic symptoms for over two years - Burning sensation in the thighs - First meaningful relief of neuropathic symptoms in the past week with use of a patch and topical cream - No benefit from prior interventions  Right Hip Arthroplasty and Scar Tissue: - Prior right hip replacement - Persistent scar tissue in the right hip region - Uncertain if scar tissue contributes to current symptoms  Electrodiagnostic Testing: - Previously underwent nerve conduction study    Objective:   Bilateral hips: VMO extends down to level of superior patellar pole bilaterally though appears slightly less voluminous on left side. 4+/5 strength with resisted hip flexion. 5/5 strength with resisted hip extension 5/5 strength with resisted knee  extension.  Extracorporeal Shockwave Therapy Procedure - #8 Following the description of risks including pain, bruising, local skin irritation, damage to surrounding structures, patient provided verbal/written consent for ESWT procedure. Palpation was used to identify the right hamstring. Patient and probe was sterilely prepped in the usual fashion with alcohol.  Total strikes: 2000 Intensity: 150 Frequency: 10  Patient tolerated well without complication. Precautions provided.     Assessment & Plan:   Assessment & Plan Alex West's right posterior thigh pain continues to be a quagmire though I am tremendously encouraged by his progress.  MRI findings potentially suggest nerve root impingement from the spine, though focal tenderness to palpation in his hamstring continues to suggest pathology there despite a normal MRI of his thigh. We Alex continue trialing shockwave and hold off on prolotherapy.   Shockwave therapy #8 provided today. F/u in 1-2 weeks for shockwave therapy #9 Plan for prolotherapy after a 2-3-week hiatus following treatment #9.  Hip flexor muscle weakness He has chronic bilateral hip flexor weakness, more pronounced on the right, with persistent burning and fatigue during activities requiring hip flexion. Normal nerve conduction studies suggest a myopathic or disuse etiology, with weakness more pronounced in hip flexors than other muscle groups. A focused physical exam was performed to assess hip flexor and quadriceps strength. Specific hip flexor strengthening exercises were discussed and added to his current regimen, with printed instructions provided for home use. He is advised to monitor for improvement over the next few weeks and return for reassessment.  "

## 2025-01-12 ENCOUNTER — Ambulatory Visit: Payer: Self-pay

## 2025-01-12 VITALS — BP 118/80

## 2025-01-12 DIAGNOSIS — S76311D Strain of muscle, fascia and tendon of the posterior muscle group at thigh level, right thigh, subsequent encounter: Secondary | ICD-10-CM

## 2025-01-12 DIAGNOSIS — S76301D Unspecified injury of muscle, fascia and tendon of the posterior muscle group at thigh level, right thigh, subsequent encounter: Secondary | ICD-10-CM

## 2025-01-12 NOTE — Progress Notes (Signed)
" ° °  Subjective:    Patient ID: Alex West, male    DOB: 69 y.o., 1956-01-02   MRN: [id number]  Chief Complaint: Right hamstring tendon shockwave #9 + bilateral hip weakness  History of Present Illness Kamarius Buckbee is a 69 year old male with chronic right hamstring injury, bilateral hip flexor weakness, neuropathy, and prior right hip replacement who presents for follow-up of persistent lower extremity weakness and neuropathic symptoms.  Right Hamstring Pain and Functional Recovery: - Significant improvement in right hamstring pain, now less constant and less intrusive - Pain localized mainly to the medial right hamstring - Using topical lidocaine  patches for symptomatic relief - but less and less now - No new or worsening symptoms - Has not started hamstring curls or loaded strengthening due to safety concerns - Interested in progressing strengthening as appropriate - Completed nine treatment sessions and is tapering formal therapy  Balance and Gait Instability: - Performs balance exercises with improved stability - Persistent balance difficulties, attributed to lower extremity polyneuropathy - Recently able to wear thick-soled shoes again without feeling unstable    Objective:   BP 118/80 (BP Location: Left Arm, Patient Position: Sitting)   Extracorporeal Shockwave Therapy Procedure - #9 Following the description of risks including pain, bruising, local skin irritation, damage to surrounding structures, patient provided verbal/written consent for ESWT procedure. Palpation was used to identify the right hamstring. Patient and probe was sterilely prepped in the usual fashion with alcohol.  Total strikes: 2000 Intensity: 150 Frequency: 10  Patient tolerated well without complication. Precautions provided.     Assessment & Plan:   Assessment & Plan [name]'s right posterior thigh pain continues to be a quagmire though I am tremendously encouraged by his  progress.  MRI findings potentially suggest nerve root impingement from the spine, though focal tenderness to palpation in his hamstring continues to suggest pathology there despite a normal MRI of his thigh. We Alex continue trialing shockwave and hold off on prolotherapy.   Shockwave therapy #9 provided today. F/u in 1-2 weeks for shockwave therapy #10 Given response to shockwave, Alex likely forgo prolotherapy.  Chronic right hamstring injury He has shown significant improvement in his chronic right hamstring injury, though mild symptoms persist with pain localized to the medial aspect near the biceps femoris. He remains cautious with strengthening progression to avoid reinjury and performs heel slides at home. He should continue heel slide exercises using his improvised Frisbee and terrycloth method. Progression to Askling hamstring exercises is recommended as tolerated, with emphasis on pain-free and proficient performance before advancing to loaded hamstring curls or higher intensity strengthening. He should avoid hamstring curls with weights or other high-load exercises until he can perform recommended exercises without pain. A follow-up is scheduled in one week with no charge for the next session.  "

## 2025-01-19 ENCOUNTER — Ambulatory Visit (INDEPENDENT_AMBULATORY_CARE_PROVIDER_SITE_OTHER): Payer: Self-pay

## 2025-01-19 DIAGNOSIS — S76301D Unspecified injury of muscle, fascia and tendon of the posterior muscle group at thigh level, right thigh, subsequent encounter: Secondary | ICD-10-CM | POA: Diagnosis not present

## 2025-01-19 DIAGNOSIS — R29898 Other symptoms and signs involving the musculoskeletal system: Secondary | ICD-10-CM

## 2025-01-19 DIAGNOSIS — M48061 Spinal stenosis, lumbar region without neurogenic claudication: Secondary | ICD-10-CM | POA: Diagnosis not present

## 2025-01-19 DIAGNOSIS — S76311D Strain of muscle, fascia and tendon of the posterior muscle group at thigh level, right thigh, subsequent encounter: Secondary | ICD-10-CM | POA: Diagnosis not present

## 2025-01-19 NOTE — Progress Notes (Signed)
 "  Subjective:    Patient ID: Alex West, male    DOB: 69 y.o., 1956-10-14   MRN: [id number]  Chief Complaint: Right hamstring tendon shockwave #10 + bilateral hip weakness  History of Present Illness Alex West is a 69 year old male with chronic right hamstring injury, bilateral hip flexor weakness, neuropathy, and prior right hip replacement who presents for follow-up of persistent lower extremity weakness and neuropathic symptoms.  Right Hamstring Pain and Functional Recovery: - Significant improvement in right hamstring pain, now less constant and less intrusive - Pain localized mainly to the medial right hamstring - Using topical lidocaine  patches for symptomatic relief - but less and less now - No new or worsening symptoms - Has not started hamstring curls or loaded strengthening due to safety concerns - Interested in progressing strengthening as appropriate - Completed nine treatment sessions and is tapering formal therapy  Balance and Gait Instability: - Performs balance exercises with improved stability - Persistent balance difficulties, attributed to lower extremity polyneuropathy - Recently able to wear thick-soled shoes again without feeling unstable    Review of Pertinent Tests: 09/23/2022 [facility] results: Selected needle examination were performed at right upper, lower extremity muscles, right cervical and lumbosacral paraspinal muscles.  There is electrodiagnostic evidence of chronic mild right C5-6-7 cervical radiculopathy, and also chronic right L4-5 S1 lumbar radiculopathy. There is no evidence of large fiber peripheral neuropathy or upper extremity focal neuropathy.   07/24/2024 L-spine MRI: 1. Spondylosis at the lumbar and visualized lower thoracic levels, and lumbar epidural lipomatosis, as outlined within the body of the report. 2. At T10-T11, a broad-based central disc protrusion results in mild spinal canal stenosis. 3. At L2-L3 and  L3-L4, there is progressive multifactorial moderate effacement of the thecal sac (with epidural lipomatosis contributing significantly at both levels). Mild left subarticular stenosis also present at L2-L3, unchanged. 4. At L3-L4, a new cranially migrated right extraforaminal disc extrusion may contact the exiting right L3 nerve root beyond the neural foramen. Correlate for right L3 radiculopathy. 5. Multilevel foraminal stenosis, greatest on the right at L4-L5 (moderate at this site). 6. Disc degeneration is greatest at L4-L5 (moderate-to-advanced with new mild-to-moderate degenerative endplate edema at this level).  11/30/24 Left Femur MRI IMPRESSION: 1. Mild tendinosis of the left gluteus minimus tendon insertion. 2. Small vertical tear of the posterior horn medial meniscus.  11/30/24 Right Femur MRI IMPRESSION: Normal  Objective:   Bilateral hips: Patient exhibiting difficulty with forward bending to remove shoes and put on examination shorts.  Hip flexion: 5/5 with Hip in 10 deg extension 5-/5 with Hip in neutral 5-/5 with Hip flexed to 90 Hip extension: 5/5 with Hip in 10 deg extension 5/5 with Hip in neutral 5/5 with Hip flexed to 90 Knee Extension: 5/5 with Hip in 10 deg extension 5/5 with Hip in neutral 5/5 with Hip flexed to 90 Positive Thomas bilaterally Negative axial load, FADIR, FABER. No reproduction of anterior thigh burning with femoral stretch test or compression at inguinal canal He is unable to perform a single leg squat in less than 6 seconds.  Extracorporeal Shockwave Therapy Procedure - #10 Following the description of risks including pain, bruising, local skin irritation, damage to surrounding structures, patient provided verbal/written consent for ESWT procedure. Palpation was used to identify the right hamstring. Patient and probe was sterilely prepped in the usual fashion with alcohol.  Total strikes: 2000 Intensity: 150 Frequency:  10  Patient tolerated well without complication. Precautions provided.  Assessment & Plan:   Assessment & Plan Alex West's right posterior thigh pain continues to be a quagmire though I am tremendously encouraged by his progress.  MRI findings and Nerve conduction study potentially suggest nerve root impingement from the spine, though focal tenderness to palpation in his hamstring continues to suggest pathology there despite a normal thigh MRI. We Alex continue trialing shockwave given his excellent response.   Shockwave therapy #10 provided today. F/u in 1-2 weeks for shockwave therapy #11 Given response to shockwave, Alex likely forgo prolotherapy  Regarding his hip flexion strength, perceived balance issues, and anterior thigh burning sensation, I would like to obtain a second opinion as I struggle to find a unifying diagnosis for these issues though the patient reiterates that the appearance of many of the aforementioned symptoms coincide with his 2023 anterior cervical C3-6 fusion done for severe central canal stenosis.  Referral sent to Dr. Ophelia Haddock.  "

## 2025-01-26 ENCOUNTER — Ambulatory Visit

## 2025-01-28 ENCOUNTER — Ambulatory Visit: Payer: Self-pay

## 2025-01-28 DIAGNOSIS — S76301D Unspecified injury of muscle, fascia and tendon of the posterior muscle group at thigh level, right thigh, subsequent encounter: Secondary | ICD-10-CM

## 2025-01-28 DIAGNOSIS — S76311D Strain of muscle, fascia and tendon of the posterior muscle group at thigh level, right thigh, subsequent encounter: Secondary | ICD-10-CM

## 2025-01-28 NOTE — Progress Notes (Unsigned)
" ° °  Subjective:    Patient ID: Alex West, male    DOB: 69 y.o., 08-06-1956   MRN: [id number]  Chief Complaint: Right hamstring tendon shockwave #11  History of Present Illness Alex West is a 69 year old male with chronic right hamstring injury    Objective:   Extracorporeal Shockwave Therapy Procedure - #11 Following the description of risks including pain, bruising, local skin irritation, damage to surrounding structures, patient provided verbal/written consent for ESWT procedure. Palpation was used to identify the right hamstring. Patient and probe was sterilely prepped in the usual fashion with alcohol.  Total strikes: 2000 Intensity: 150 Frequency: 10  Patient tolerated well without complication. Precautions provided.    Assessment & Plan:   Assessment & Plan Alex West's right posterior thigh pain continues to be a quagmire though I am tremendously encouraged by his progress.  MRI findings and Nerve conduction study potentially suggest nerve root impingement from the spine, though focal tenderness to palpation in his hamstring continues to suggest pathology there despite a normal thigh MRI. We Alex continue trialing shockwave given his excellent response.   Shockwave therapy #11 provided today. F/u in 1-2 weeks for shockwave therapy #12 Given response to shockwave, Alex likely forgo prolotherapy   "

## 2025-02-10 ENCOUNTER — Ambulatory Visit: Admitting: Sports Medicine

## 2025-03-11 ENCOUNTER — Encounter: Admitting: Physical Medicine and Rehabilitation
